# Patient Record
Sex: Male | Born: 1950
Health system: Southern US, Community
[De-identification: ages and names within clinical notes are randomized; demographics above are authoritative.]

## PROBLEM LIST (undated history)

## (undated) DIAGNOSIS — M549 Dorsalgia, unspecified: Secondary | ICD-10-CM

## (undated) DIAGNOSIS — G709 Myoneural disorder, unspecified: Secondary | ICD-10-CM

## (undated) DIAGNOSIS — K219 Gastro-esophageal reflux disease without esophagitis: Secondary | ICD-10-CM

## (undated) DIAGNOSIS — D369 Benign neoplasm, unspecified site: Secondary | ICD-10-CM

## (undated) DIAGNOSIS — F419 Anxiety disorder, unspecified: Secondary | ICD-10-CM

## (undated) DIAGNOSIS — K2270 Barrett's esophagus without dysplasia: Secondary | ICD-10-CM

## (undated) DIAGNOSIS — I739 Peripheral vascular disease, unspecified: Secondary | ICD-10-CM

## (undated) DIAGNOSIS — E119 Type 2 diabetes mellitus without complications: Secondary | ICD-10-CM

## (undated) DIAGNOSIS — N189 Chronic kidney disease, unspecified: Secondary | ICD-10-CM

## (undated) DIAGNOSIS — I10 Essential (primary) hypertension: Secondary | ICD-10-CM

## (undated) DIAGNOSIS — H269 Unspecified cataract: Secondary | ICD-10-CM

## (undated) DIAGNOSIS — I219 Acute myocardial infarction, unspecified: Secondary | ICD-10-CM

## (undated) DIAGNOSIS — M1990 Unspecified osteoarthritis, unspecified site: Secondary | ICD-10-CM

## (undated) DIAGNOSIS — M48061 Spinal stenosis, lumbar region without neurogenic claudication: Secondary | ICD-10-CM

## (undated) DIAGNOSIS — M519 Unspecified thoracic, thoracolumbar and lumbosacral intervertebral disc disorder: Secondary | ICD-10-CM

## (undated) DIAGNOSIS — F32A Depression, unspecified: Secondary | ICD-10-CM

## (undated) DIAGNOSIS — K3184 Gastroparesis: Secondary | ICD-10-CM

## (undated) DIAGNOSIS — I2510 Atherosclerotic heart disease of native coronary artery without angina pectoris: Secondary | ICD-10-CM

## (undated) DIAGNOSIS — E785 Hyperlipidemia, unspecified: Secondary | ICD-10-CM

## (undated) DIAGNOSIS — R627 Adult failure to thrive: Secondary | ICD-10-CM

## (undated) HISTORY — PX: CATARACT EXTRACTION W/ INTRAOCULAR LENS IMPLANT: SHX1309

## (undated) HISTORY — DX: Spinal stenosis, lumbar region without neurogenic claudication: M48.061

## (undated) HISTORY — DX: Hyperlipidemia, unspecified: E78.5

## (undated) HISTORY — DX: Essential (primary) hypertension: I10

## (undated) HISTORY — DX: Unspecified cataract: H26.9

## (undated) HISTORY — PX: BACK SURGERY: SHX140

## (undated) HISTORY — DX: Peripheral vascular disease, unspecified: I73.9

## (undated) HISTORY — DX: Barrett's esophagus without dysplasia: K22.70

## (undated) HISTORY — PX: SPINAL FUSION: SHX223

## (undated) HISTORY — DX: Gastroparesis: K31.84

## (undated) HISTORY — DX: Benign neoplasm, unspecified site: D36.9

## (undated) HISTORY — PX: SPINAL CORD STIMULATOR IMPLANT: SHX2422

## (undated) HISTORY — DX: Unspecified thoracic, thoracolumbar and lumbosacral intervertebral disc disorder: M51.9

## (undated) HISTORY — DX: Myoneural disorder, unspecified: G70.9

## (undated) HISTORY — PX: SPINAL CORD STIMULATOR REMOVAL: SHX2423

## (undated) HISTORY — DX: Chronic kidney disease, unspecified: N18.9

---

## 1986-09-21 HISTORY — PX: KNEE ARTHROSCOPY: SHX127

## 1995-09-22 DIAGNOSIS — I219 Acute myocardial infarction, unspecified: Secondary | ICD-10-CM

## 1995-09-22 HISTORY — DX: Acute myocardial infarction, unspecified: I21.9

## 1999-02-21 ENCOUNTER — Inpatient Hospital Stay (HOSPITAL_COMMUNITY): Admission: EM | Admit: 1999-02-21 | Discharge: 1999-02-21 | Payer: Self-pay | Admitting: Internal Medicine

## 2004-02-25 ENCOUNTER — Ambulatory Visit (HOSPITAL_COMMUNITY): Admission: RE | Admit: 2004-02-25 | Discharge: 2004-02-25 | Payer: Self-pay | Admitting: *Deleted

## 2004-02-25 ENCOUNTER — Encounter (INDEPENDENT_AMBULATORY_CARE_PROVIDER_SITE_OTHER): Payer: Self-pay | Admitting: Specialist

## 2005-01-26 ENCOUNTER — Emergency Department (HOSPITAL_COMMUNITY): Admission: EM | Admit: 2005-01-26 | Discharge: 2005-01-26 | Payer: Self-pay | Admitting: Emergency Medicine

## 2005-06-08 ENCOUNTER — Encounter (INDEPENDENT_AMBULATORY_CARE_PROVIDER_SITE_OTHER): Payer: Self-pay | Admitting: Specialist

## 2005-06-08 ENCOUNTER — Ambulatory Visit (HOSPITAL_COMMUNITY): Admission: RE | Admit: 2005-06-08 | Discharge: 2005-06-08 | Payer: Self-pay | Admitting: *Deleted

## 2005-10-27 ENCOUNTER — Emergency Department (HOSPITAL_COMMUNITY): Admission: EM | Admit: 2005-10-27 | Discharge: 2005-10-27 | Payer: Self-pay | Admitting: Family Medicine

## 2006-04-28 ENCOUNTER — Ambulatory Visit (HOSPITAL_COMMUNITY): Admission: RE | Admit: 2006-04-28 | Discharge: 2006-04-28 | Payer: Self-pay | Admitting: Neurosurgery

## 2006-10-23 ENCOUNTER — Ambulatory Visit: Payer: Self-pay | Admitting: Neurosurgery

## 2007-01-06 ENCOUNTER — Ambulatory Visit (HOSPITAL_COMMUNITY): Admission: RE | Admit: 2007-01-06 | Discharge: 2007-01-06 | Payer: Self-pay | Admitting: Neurosurgery

## 2007-03-18 ENCOUNTER — Ambulatory Visit: Payer: Self-pay | Admitting: Neurosurgery

## 2007-04-12 ENCOUNTER — Ambulatory Visit (HOSPITAL_COMMUNITY): Admission: RE | Admit: 2007-04-12 | Discharge: 2007-04-12 | Payer: Self-pay | Admitting: Surgery

## 2007-04-25 ENCOUNTER — Ambulatory Visit (HOSPITAL_COMMUNITY): Admission: RE | Admit: 2007-04-25 | Discharge: 2007-04-25 | Payer: Self-pay | Admitting: Family Medicine

## 2007-04-28 ENCOUNTER — Ambulatory Visit (HOSPITAL_COMMUNITY): Admission: RE | Admit: 2007-04-28 | Discharge: 2007-04-28 | Payer: Self-pay | Admitting: Family Medicine

## 2007-05-11 ENCOUNTER — Ambulatory Visit (HOSPITAL_COMMUNITY): Admission: RE | Admit: 2007-05-11 | Discharge: 2007-05-11 | Payer: Self-pay | Admitting: *Deleted

## 2007-05-11 ENCOUNTER — Encounter (INDEPENDENT_AMBULATORY_CARE_PROVIDER_SITE_OTHER): Payer: Self-pay | Admitting: *Deleted

## 2007-05-13 ENCOUNTER — Encounter: Admission: RE | Admit: 2007-05-13 | Discharge: 2007-05-13 | Payer: Self-pay | Admitting: *Deleted

## 2007-06-03 ENCOUNTER — Inpatient Hospital Stay (HOSPITAL_COMMUNITY): Admission: RE | Admit: 2007-06-03 | Discharge: 2007-06-06 | Payer: Self-pay | Admitting: Neurosurgery

## 2007-07-07 ENCOUNTER — Encounter: Admission: RE | Admit: 2007-07-07 | Discharge: 2007-07-07 | Payer: Self-pay | Admitting: Neurosurgery

## 2007-10-13 ENCOUNTER — Ambulatory Visit: Payer: Self-pay | Admitting: Neurosurgery

## 2007-12-13 ENCOUNTER — Encounter: Admission: RE | Admit: 2007-12-13 | Discharge: 2007-12-13 | Payer: Self-pay | Admitting: Neurosurgery

## 2007-12-17 ENCOUNTER — Ambulatory Visit: Payer: Self-pay | Admitting: Neurosurgery

## 2008-03-12 ENCOUNTER — Ambulatory Visit: Payer: Self-pay | Admitting: Neurosurgery

## 2008-08-14 ENCOUNTER — Encounter: Admission: RE | Admit: 2008-08-14 | Discharge: 2008-08-14 | Payer: Self-pay | Admitting: Neurosurgery

## 2008-10-01 ENCOUNTER — Encounter: Admission: RE | Admit: 2008-10-01 | Discharge: 2008-10-01 | Payer: Self-pay | Admitting: Neurosurgery

## 2008-10-31 ENCOUNTER — Inpatient Hospital Stay (HOSPITAL_COMMUNITY): Admission: RE | Admit: 2008-10-31 | Discharge: 2008-11-02 | Payer: Self-pay | Admitting: Neurosurgery

## 2008-12-18 ENCOUNTER — Inpatient Hospital Stay (HOSPITAL_COMMUNITY): Admission: EM | Admit: 2008-12-18 | Discharge: 2008-12-19 | Payer: Self-pay | Admitting: Emergency Medicine

## 2008-12-21 ENCOUNTER — Inpatient Hospital Stay (HOSPITAL_COMMUNITY): Admission: EM | Admit: 2008-12-21 | Discharge: 2008-12-23 | Payer: Self-pay | Admitting: Emergency Medicine

## 2009-06-28 ENCOUNTER — Encounter: Admission: RE | Admit: 2009-06-28 | Discharge: 2009-06-28 | Payer: Self-pay | Admitting: Neurosurgery

## 2009-08-13 ENCOUNTER — Encounter: Admission: RE | Admit: 2009-08-13 | Discharge: 2009-08-13 | Payer: Self-pay | Admitting: Neurosurgery

## 2009-08-16 ENCOUNTER — Encounter: Admission: RE | Admit: 2009-08-16 | Discharge: 2009-08-16 | Payer: Self-pay | Admitting: Neurosurgery

## 2009-12-04 ENCOUNTER — Inpatient Hospital Stay (HOSPITAL_COMMUNITY): Admission: RE | Admit: 2009-12-04 | Discharge: 2009-12-07 | Payer: Self-pay | Admitting: Neurosurgery

## 2010-01-02 ENCOUNTER — Encounter: Admission: RE | Admit: 2010-01-02 | Discharge: 2010-01-02 | Payer: Self-pay | Admitting: Neurosurgery

## 2010-04-01 IMAGING — CR DG LUMBAR SPINE 1V
1 series · 1 of 1 positions shown · non-contrast
Comparison: Lumbar spine films of 12/13/2007

CLINICAL DATA: Low back pain, prior lumbar spine fusion

LUMBAR SPINE - 1 VIEW

[w l-spine lat *]
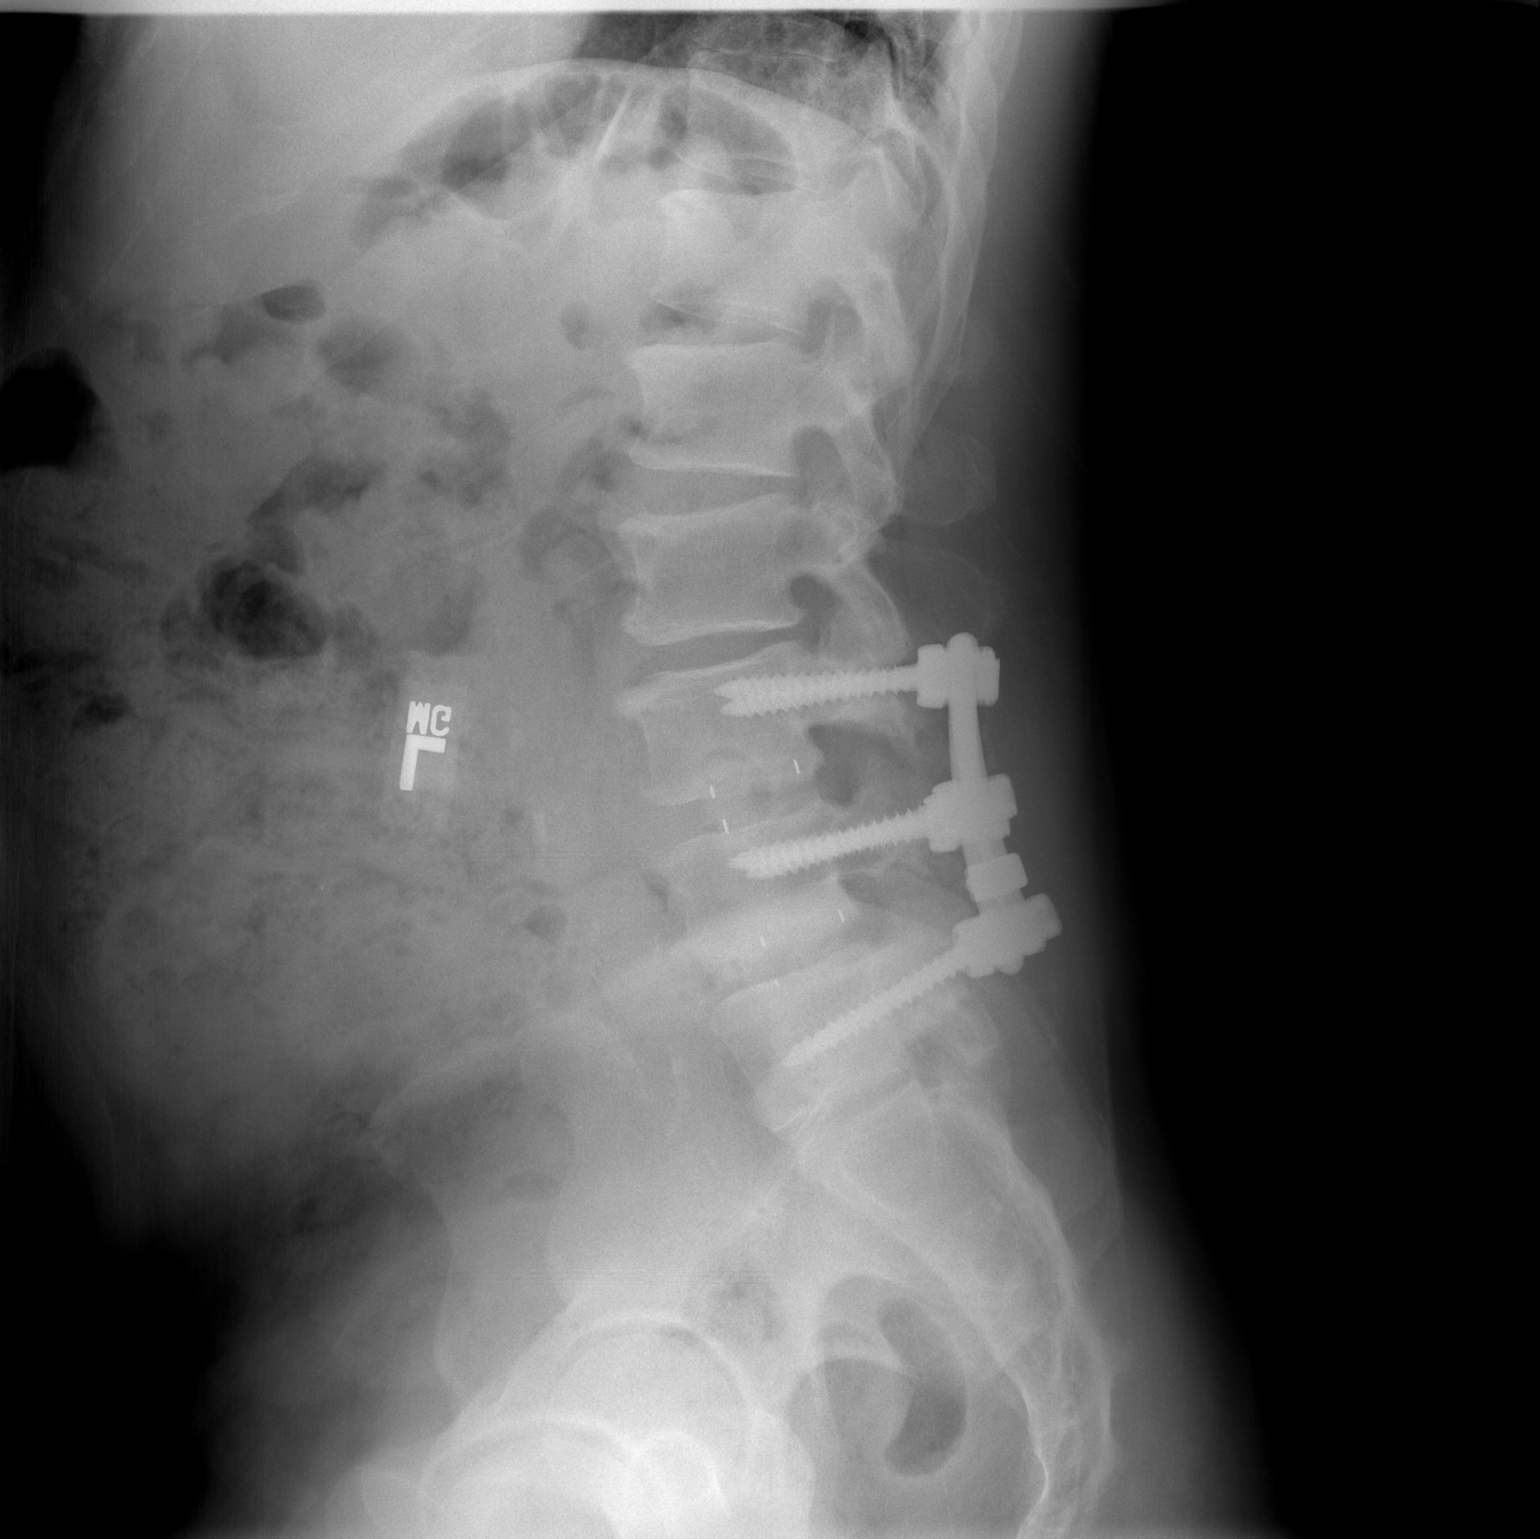

[1 of 1 positions shown; findings below may reference images not displayed]

FINDINGS: Posterior fusion at L3-4 and L4-5 appears stable.  Normal
alignment is maintained.  The hardware is intact.  Interbody fusion
plugs at L3-4 and L4-5 are unchanged in position.
IMPRESSION: Stable appearance of posterior fusion at L3-4 and L4-5 with normal
alignment.

## 2010-06-17 ENCOUNTER — Encounter: Admission: RE | Admit: 2010-06-17 | Discharge: 2010-06-17 | Payer: Self-pay | Admitting: Neurosurgery

## 2010-10-12 ENCOUNTER — Encounter: Payer: Self-pay | Admitting: Interventional Cardiology

## 2010-10-12 ENCOUNTER — Encounter: Payer: Self-pay | Admitting: Surgery

## 2010-12-14 LAB — DIFFERENTIAL
Basophils Absolute: 0 10*3/uL (ref 0.0–0.1)
Eosinophils Relative: 4 % (ref 0–5)
Lymphocytes Relative: 32 % (ref 12–46)
Lymphs Abs: 2.6 10*3/uL (ref 0.7–4.0)
Monocytes Absolute: 0.6 10*3/uL (ref 0.1–1.0)
Monocytes Relative: 7 % (ref 3–12)
Neutro Abs: 4.5 10*3/uL (ref 1.7–7.7)

## 2010-12-14 LAB — CBC
HCT: 39.6 % (ref 39.0–52.0)
Hemoglobin: 13.4 g/dL (ref 13.0–17.0)
RBC: 4.48 MIL/uL (ref 4.22–5.81)
RDW: 13.9 % (ref 11.5–15.5)
WBC: 8.1 10*3/uL (ref 4.0–10.5)

## 2010-12-14 LAB — TYPE AND SCREEN
ABO/RH(D): O POS
Antibody Screen: NEGATIVE

## 2010-12-14 LAB — GLUCOSE, CAPILLARY
Glucose-Capillary: 142 mg/dL — ABNORMAL HIGH (ref 70–99)
Glucose-Capillary: 156 mg/dL — ABNORMAL HIGH (ref 70–99)
Glucose-Capillary: 163 mg/dL — ABNORMAL HIGH (ref 70–99)
Glucose-Capillary: 196 mg/dL — ABNORMAL HIGH (ref 70–99)
Glucose-Capillary: 203 mg/dL — ABNORMAL HIGH (ref 70–99)
Glucose-Capillary: 204 mg/dL — ABNORMAL HIGH (ref 70–99)
Glucose-Capillary: 205 mg/dL — ABNORMAL HIGH (ref 70–99)

## 2010-12-14 LAB — BASIC METABOLIC PANEL
Calcium: 9.4 mg/dL (ref 8.4–10.5)
GFR calc Af Amer: >60 mL/min (ref >60)
GFR calc non Af Amer: >60 mL/min (ref >60)
Potassium: 4.5 mEq/L (ref 3.5–5.1)
Sodium: 137 mEq/L (ref 135–145)

## 2010-12-14 LAB — APTT: aPTT: 30 seconds (ref 24–37)

## 2010-12-14 LAB — MRSA PCR SCREENING: MRSA by PCR: NEGATIVE

## 2010-12-14 LAB — SURGICAL PCR SCREEN
MRSA, PCR: NEGATIVE
Staphylococcus aureus: NEGATIVE

## 2010-12-31 LAB — GLUCOSE, CAPILLARY
Glucose-Capillary: 116 mg/dL — ABNORMAL HIGH (ref 70–99)
Glucose-Capillary: 117 mg/dL — ABNORMAL HIGH (ref 70–99)
Glucose-Capillary: 172 mg/dL — ABNORMAL HIGH (ref 70–99)
Glucose-Capillary: 178 mg/dL — ABNORMAL HIGH (ref 70–99)
Glucose-Capillary: 218 mg/dL — ABNORMAL HIGH (ref 70–99)

## 2010-12-31 LAB — CBC
HCT: 34.1 % — ABNORMAL LOW (ref 39.0–52.0)
Hemoglobin: 11.8 g/dL — ABNORMAL LOW (ref 13.0–17.0)
Hemoglobin: 11.9 g/dL — ABNORMAL LOW (ref 13.0–17.0)
MCV: 86.6 fL (ref 78.0–100.0)
RBC: 3.96 MIL/uL — ABNORMAL LOW (ref 4.22–5.81)
RBC: 4.06 MIL/uL — ABNORMAL LOW (ref 4.22–5.81)
WBC: 8.7 10*3/uL (ref 4.0–10.5)
WBC: 8.8 10*3/uL (ref 4.0–10.5)

## 2010-12-31 LAB — DIFFERENTIAL
Basophils Absolute: 0 10*3/uL (ref 0.0–0.1)
Eosinophils Absolute: 0 10*3/uL (ref 0.0–0.7)
Eosinophils Relative: 0 % (ref 0–5)
Neutrophils Relative %: 80 % — ABNORMAL HIGH (ref 43–77)

## 2010-12-31 LAB — HEMOCCULT GUIAC POC 1CARD (OFFICE)
Fecal Occult Bld: NEGATIVE
Fecal Occult Bld: NEGATIVE
Fecal Occult Bld: NEGATIVE

## 2010-12-31 LAB — LIPASE, BLOOD: Lipase: 15 U/L (ref 11–59)

## 2010-12-31 LAB — COMPREHENSIVE METABOLIC PANEL
ALT: 19 U/L (ref 0–53)
AST: 22 U/L (ref 0–37)
CO2: 28 mEq/L (ref 19–32)
Chloride: 105 mEq/L (ref 96–112)
Creatinine, Ser: 0.82 mg/dL (ref 0.4–1.5)
GFR calc Af Amer: >60 mL/min (ref >60)
GFR calc non Af Amer: >60 mL/min (ref >60)
Glucose, Bld: 180 mg/dL — ABNORMAL HIGH (ref 70–99)
Total Bilirubin: 0.5 mg/dL (ref 0.3–1.2)

## 2010-12-31 LAB — T3: T3, Total: 87.5 ng/dl (ref 80.0–204.0)

## 2010-12-31 LAB — T4, FREE: Free T4: 1.11 ng/dL (ref 0.89–1.80)

## 2010-12-31 LAB — APTT: aPTT: 28 seconds (ref 24–37)

## 2010-12-31 LAB — POCT CARDIAC MARKERS

## 2011-01-01 LAB — COMPREHENSIVE METABOLIC PANEL
ALT: 11 U/L (ref 0–53)
ALT: 11 U/L (ref 0–53)
AST: 21 U/L (ref 0–37)
Albumin: 3.6 g/dL (ref 3.5–5.2)
Alkaline Phosphatase: 70 U/L (ref 39–117)
Alkaline Phosphatase: 84 U/L (ref 39–117)
BUN: 9 mg/dL (ref 6–23)
CO2: 24 mEq/L (ref 19–32)
Calcium: 9.4 mg/dL (ref 8.4–10.5)
GFR calc Af Amer: >60 mL/min (ref >60)
GFR calc non Af Amer: >60 mL/min (ref >60)
Glucose, Bld: 161 mg/dL — ABNORMAL HIGH (ref 70–99)
Potassium: 3.5 mEq/L (ref 3.5–5.1)
Potassium: 3.6 mEq/L (ref 3.5–5.1)
Sodium: 140 mEq/L (ref 135–145)
Sodium: 140 mEq/L (ref 135–145)
Total Protein: 7.2 g/dL (ref 6.0–8.3)

## 2011-01-01 LAB — HEMOGLOBIN A1C
Hgb A1c MFr Bld: 7 % — ABNORMAL HIGH (ref 4.6–6.1)
Mean Plasma Glucose: 154 mg/dL

## 2011-01-01 LAB — CARDIAC PANEL(CRET KIN+CKTOT+MB+TROPI)
CK, MB: 1.5 ng/mL (ref 0.3–4.0)
Relative Index: 1.4 (ref 0.0–2.5)
Troponin I: <0.01 ng/mL (ref 0.00–0.06)
Troponin I: <0.01 ng/mL (ref 0.00–0.06)

## 2011-01-01 LAB — POCT CARDIAC MARKERS
CKMB, poc: 1.9 ng/mL (ref 1.0–8.0)
Troponin i, poc: <0.05 ng/mL (ref 0.00–0.09)

## 2011-01-01 LAB — LIPID PANEL
HDL: 42 mg/dL (ref >39)
LDL Cholesterol: 124 mg/dL — ABNORMAL HIGH (ref 0–99)
Total CHOL/HDL Ratio: 4.3 RATIO
Triglycerides: 64 mg/dL (ref <150)
VLDL: 13 mg/dL (ref 0–40)

## 2011-01-01 LAB — LACTIC ACID, PLASMA: Lactic Acid, Venous: 2.7 mmol/L — ABNORMAL HIGH (ref 0.5–2.2)

## 2011-01-01 LAB — GLUCOSE, CAPILLARY
Glucose-Capillary: 157 mg/dL — ABNORMAL HIGH (ref 70–99)
Glucose-Capillary: 167 mg/dL — ABNORMAL HIGH (ref 70–99)

## 2011-01-01 LAB — CK TOTAL AND CKMB (NOT AT ARMC)
Relative Index: 1.8 (ref 0.0–2.5)
Total CK: 137 U/L (ref 7–232)

## 2011-01-01 LAB — DIFFERENTIAL
Basophils Relative: 1 % (ref 0–1)
Eosinophils Absolute: 0 10*3/uL (ref 0.0–0.7)
Eosinophils Relative: 0 % (ref 0–5)
Lymphs Abs: 1 10*3/uL (ref 0.7–4.0)
Monocytes Absolute: 0.2 10*3/uL (ref 0.1–1.0)
Monocytes Relative: 3 % (ref 3–12)
Neutrophils Relative %: 83 % — ABNORMAL HIGH (ref 43–77)

## 2011-01-01 LAB — CBC
HCT: 32.9 % — ABNORMAL LOW (ref 39.0–52.0)
Hemoglobin: 11.3 g/dL — ABNORMAL LOW (ref 13.0–17.0)
Hemoglobin: 12.4 g/dL — ABNORMAL LOW (ref 13.0–17.0)
MCHC: 33.6 g/dL (ref 30.0–36.0)
MCHC: 34.2 g/dL (ref 30.0–36.0)
Platelets: 303 10*3/uL (ref 150–400)
RBC: 3.8 MIL/uL — ABNORMAL LOW (ref 4.22–5.81)
RDW: 14.1 % (ref 11.5–15.5)

## 2011-01-01 LAB — BRAIN NATRIURETIC PEPTIDE: Pro B Natriuretic peptide (BNP): 171 pg/mL — ABNORMAL HIGH (ref 0.0–100.0)

## 2011-01-06 LAB — TYPE AND SCREEN
Antibody Screen: NEGATIVE
Antibody Screen: POSITIVE
PT AG Type: NEGATIVE

## 2011-01-06 LAB — POCT I-STAT 7, (LYTES, BLD GAS, ICA,H+H)
Bicarbonate: 28.5 mEq/L — ABNORMAL HIGH (ref 20.0–24.0)
Calcium, Ion: 1.16 mmol/L (ref 1.12–1.32)
HCT: 28 % — ABNORMAL LOW (ref 39.0–52.0)
Hemoglobin: 9.5 g/dL — ABNORMAL LOW (ref 13.0–17.0)
O2 Saturation: 100 %
O2 Saturation: 100 %
Patient temperature: 35.6
Potassium: 4.1 mEq/L (ref 3.5–5.1)
Potassium: 4.2 mEq/L (ref 3.5–5.1)
TCO2: 30 mmol/L (ref 0–100)
pCO2 arterial: 43.8 mmHg (ref 35.0–45.0)
pCO2 arterial: 46.7 mmHg — ABNORMAL HIGH (ref 35.0–45.0)
pH, Arterial: 7.406 (ref 7.350–7.450)
pO2, Arterial: 437 mmHg — ABNORMAL HIGH (ref 80.0–100.0)

## 2011-01-06 LAB — GLUCOSE, CAPILLARY
Glucose-Capillary: 133 mg/dL — ABNORMAL HIGH (ref 70–99)
Glucose-Capillary: 247 mg/dL — ABNORMAL HIGH (ref 70–99)

## 2011-01-06 LAB — DIFFERENTIAL
Eosinophils Absolute: 0.3 10*3/uL (ref 0.0–0.7)
Eosinophils Relative: 4 % (ref 0–5)
Lymphs Abs: 2.3 10*3/uL (ref 0.7–4.0)
Monocytes Relative: 8 % (ref 3–12)

## 2011-01-06 LAB — CBC
HCT: 38.6 % — ABNORMAL LOW (ref 39.0–52.0)
Hemoglobin: 13.1 g/dL (ref 13.0–17.0)
MCV: 89.4 fL (ref 78.0–100.0)
RBC: 4.32 MIL/uL (ref 4.22–5.81)
WBC: 8.1 10*3/uL (ref 4.0–10.5)

## 2011-02-03 NOTE — Op Note (Signed)
NAME:  Justin Key, Justin Key NO.:  [id number]   MEDICAL RECORD NO.:  [id number]          PATIENT TYPE:  INP   LOCATION:  2899                         FACILITY:  [facility]   PHYSICIAN:  Donalee Citrin, M.D.        DATE OF BIRTH:  26-May-1951   DATE OF PROCEDURE:  10/31/2008  DATE OF DISCHARGE:                               OPERATIVE REPORT   PREOPERATIVE DIAGNOSIS:  1. Lumbar spinal stenosis.  2. 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  3. Possible pseudoarthrosis at L4 through S1 with transitional segment      being labeled as 1.   PROCEDURE:  Re-exploration of  lumbar fusion at L4 to S1, decompression  of lumbar laminectomy at L2-3 and L3-4, and __________ interbody fusion,  posterior lumbar interbody fusion at L2-3 and L3-4 using a hybrid  Telamon PEEK cage and tangent allograft wedge at this level.  Pedicle  screw fixation at L2 through S1 using 6.35 pedicle screw system,  posterolateral arthrodesis at L2-S1 using local autograft mixed with  Actifuse as well as BMP and Mastergraft.   ASSISTANT:  Kathaleen Maser. Pool, MD.   ANESTHESIA:  General endotracheal.   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  He subsequently underwent laminectomy and diskectomy, subsequently  required an L4-S1 fusion.  Since then initially did well, however, had  progressive worsening back and leg pain over the last several months.  Repeat imaging showed progressive breakdown at L3-4 and L2-3 above the  level of this fusion.  This showed some evidence of instability on his  dynamic x-rays as well as his CT scan.  There was a hint that he had  only partially fused at L4-L5 and L5 to S1, possible pseudoarthrosis  with severe foraminal stenosis and central stenosis at L3-4 and  subsequently a facet arthropathy and foraminal stenosis at L2-3.  Due to  the patient's failure to conservative treatment and progressing  disease  and possible pseudarthrosis, the patient was recommended reexploration  of lumbar fusion at L4 to S1 as well as decompression and stabilization  procedures at L2-3 and L3-4.  Risks and benefits of the operation were  explained to the patient.  He understood and agreed to proceed forward.   The patient was brought into the OR, was induced general anesthesia,  positioned prone on the Wilson frame.  Back was prepped and draped in  the usual sterile fashion.  His old incision was opened up and scar  tissue was dissected off the hardware, exposing the hardware at L4-S1 as  well as subperiosteal dissection, exposing the T-piece at L2-3 and L3-4.  Intraoperative x-ray confirmed localization at the appropriate level.  The old nuts and rods were then removed.  Dissection was carried out  posterolaterally from L4 to S1.  There was a fair amount of  posterolateral bend and all the screws appeared to be solid as well as  there was immediate hint of motion at L5 to S1 between the L5 and S1  screws.  However, this  was not grossly unstable by any means and there  was a fair amount of solid posterolateral bone, especially on the left  side extending from L4 to S1.  Then, attention was taken to the  decompression and spinous processes were removed at L2-3.  Central  decompression was begun.  There was marked facet arthropathy at L3-4  with severe allograft compression of thecal sac and an extensive scar  tissue.  This was all dissected free with a 4 Penfield and removed in  piecemeal fashion.  Decompression of the central canal and complete  medial facetectomies were performed at L2-3 and L3-4.  This helped  decompress both at L2, 3, 4 roots and access was gained at the lateral  margin of the disk space after __________ coagulated and after  decompression was completed, __________ L2, 3, 4 roots.  Attention was  taken to doing a pedicle screw placement.  Using a high-speed drill,  pilot holes  were drilled at L2 of the right, cannulated with the awl  probed with a pedicle probed, and tapped with a 5 x 5 tap, probed again  and 6.5 x 45 screws inserted on the right at L2 using both external and  internal bony landmarks as well as fluoroscopy confirmed trajectory.  This screw was placed in an excellent purchase.  An L3 screw was placed  in a similar fashion as well as the L2-3 screw on the left side.  After  all screws were placed,  attention was taken to interbody work fusion at  L3-4 reflecting the right L4 nerve root medially and  __________11  scalpel and a size 10 distractor was inserted after disk space was  cleaned out.  A good apposition of the endplates felt to be appropriate  size and graft material.  So working on the left side, there was noted  to be anular tear and frank herniation displaced in both L3 and L4 roots  on the left.  This was all cleaned out radically and then using a size  10 cutter and chisel, endplates were scraped.  Disk space was cleaned  out.  A tangent allograft wedge was inserted after bone scrapping the  endplates with chisel were applied.  Again, fluoroscopy used at each  step along the way to confirm depth and trajectory.  A Telamon 10 x 20  mm PEEK cage packed with local autograft and Actifuse was inserted on  the patient's left side.  Then, working on the right side, the  distractor was removed.  Disk space was cleaned out in a similar fashion  with size 10 cutter and chisel.  A local autograft was packed centrally  after central endplates were scraped and central disk was removed and  then on the right side, Tangent was inserted.  After this hemostasis was  done.  Attention was taken to L2-3.  This disk __________ markedly  degenerated.  This was cleaned out as well in a similar fashion with  size 10 cutter and chisel.  Tangent allograft was inserted on the left  side, Telamon packed with autograft and Actifuse was inserted on the  patient's  right side with autograft centrally.  After all the interbody  work done, attention was taken to copious irrigation, and aggressive  decortication was carried out at the T-piece lateral gutters and the  posterolateral bone from L4 down to S1, down to the T-piece.  The  remainder of the autograft mixed with Actifuse was packed  posterolaterally.  Augmented with  BMP and Mastergraft, all along at L2  down the S1.  This has been done, a rod was cut, fashioned, and lordosed  and nuts were tightened down in situ from L4-L5 and S1, then the L3  screws compressed against S1 and the L2 compressed against L3.  Then a  420 cross-link was inserted.  The neuroforamina were all reinspected and  they were noted to be widely patent.  Gelfoam was overlaid on top of the  dura.  Large Hemovac drain was placed and then the wound was closed in  layers with interrupted Vicryl and the skin was closed with a running 4-  0 subcuticular.  Benzoin and Steri-Strips were applied.  The patient  went to recovery room in stable condition.  At the end of the case,  sponge and instrument counts were correct.           ______________________________  Donalee Citrin, M.D.     GC/MEDQ  D:  10/31/2008  T:  11/01/2008  Job:  [id number]

## 2011-02-03 NOTE — Discharge Summary (Signed)
NAME:  Justin Key, Justin Key NO.:  [id number]   MEDICAL RECORD NO.:  [id number]          PATIENT TYPE:  AMB   LOCATION:  ENDO                         FACILITY:  [facility]   PHYSICIAN:  Donalee Citrin, M.D.        DATE OF BIRTH:  Jan 02, 1951   DATE OF ADMISSION:  05/11/2007  DATE OF DISCHARGE:  05/11/2007                               DISCHARGE SUMMARY   ADMITTING DIAGNOSES:  1. Recurrent disk herniation.  2. Lumbar spinal stenosis L4-5, L5-S1.   PROCEDURE:  Redo decompressive laminectomy L5-S1, decompressive  laminectomy L3-4-5, posterior lumbar fusion L4-5 and L5-S1.   HOSPITAL COURSE:  The patient is a very pleasant 60 year old gentleman  who several months ago had undergone redo laminectomy for recurring  disk.  The patient did very well for about 6 weeks and then recurred a  disk again.  Due to the patient's multiple recurrences, his progressive  worsening back and leg pain, his MRI findings showing a large recurrent  disk herniation with progressive spinal stenosis, the patient was  recommended redo decompressive laminectomy and fusion.  The risks and  benefits of the operation were explained to the patient and so he was  brought into the OR.  He was admitted to the hospital as an EMA  underwent the aforementioned procedure.  Postoperatively, the patient  did very well on the recovery room and the floor.  On the floor, the  patient convalesced well with significant improvement of his leg pain.  He was progressively mobilized over the first 24 to 48 hours.  His wound  remained dry.  His Foley was even taken out on day one.  He was  ambulating with physical therapy.  His pain became under good control  with the oral medications and by hospital day 3, the patient was stable  and was able to be discharged home.  He was discharged on Oxycodone and  Valium and ambulating and voiding spontaneously.           ______________________________  Donalee Citrin, M.D.     GC/MEDQ  D:   07/01/2007  T:  07/01/2007  Job:  [id number]

## 2011-02-03 NOTE — Op Note (Signed)
NAME:  Justin Key, Justin Key NO.:  [id number]   MEDICAL RECORD NO.:  [id number]          PATIENT TYPE:  AMB   LOCATION:  ENDO                         FACILITY:  [facility]   PHYSICIAN:  Georgiana Spinner, M.D.    DATE OF BIRTH:  1951/08/23   DATE OF PROCEDURE:  05/11/2007  DATE OF DISCHARGE:                               OPERATIVE REPORT   PROCEDURE:  Upper endoscopy with biopsy.   INDICATIONS:  GERD with question of Barrett's esophagus.   ANESTHESIA:  Demerol 100 mg, Versed 10 mg, Phenergan 12 mg.   DESCRIPTION OF PROCEDURE:  With the patient mildly sedated in the left  lateral decubitus position, the Pentax videoscopic endoscope was  inserted in the mouth and passed under direct vision through the  esophagus which appeared normal.  The distal esophagus was approached  and I could not see clear cut evidence of Barrett's esophagus.  The  patient became somewhat combative at this point and we photographed the  area and took biopsies of the squamocolumnar junction.  I advanced into  the stomach, fundus, body, antrum, duodenal bulb, and second portion of  the duodenum appeared normal.  From this point, the endoscope was slowly  withdrawn taking circumferential views of the duodenal mucosa until the  endoscope had been pulled back into the stomach and placed in  retroflexion to view the stomach from below.  No loose wrap of the GE  junction around the endoscope was noted indicating laxity of the lower  esophageal sphincter. The endoscope was then straightened and withdrawn.  The patient's vital signs and pulse oximetry remained stable.  The  patient tolerated the procedure well without apparent complications.   FINDINGS:  Fairly unremarkable examination, clear cut evidence of  Barrett's could not be seen at this time. The patient was somewhat  combative during this exam which limited our view, somewhat.   PLAN:  Await biopsy report.  The patient will call me for results and  follow-up with me as an outpatient.           ______________________________  Georgiana Spinner, M.D.     GMO/MEDQ  D:  05/11/2007  T:  05/11/2007  Job:  [id number]   cc:   [name], M.D.  Fax: [phone]   Donalee Citrin, M.D.  Fax: [phone]   Ardeth Sportsman, MD  [address] [place] [address]

## 2011-02-03 NOTE — H&P (Signed)
NAME:  Justin Key, Justin Key NO.:  [id number]   MEDICAL RECORD NO.:  [id number]          PATIENT TYPE:  INP   LOCATION:  1826                         FACILITY:  [facility]   PHYSICIAN:  Michiel Cowboy, MDDATE OF BIRTH:  Jul 17, 1951   DATE OF ADMISSION:  12/17/2008  DATE OF DISCHARGE:                              HISTORY & PHYSICAL   PRIMARY CARE PHYSICIAN:  [name], M.D.   CHIEF COMPLAINT:  Chest pain, shortness of breath and severe nausea.   The patient is a 60 year old gentleman with past history of myocardial  infarction although I do not see this documented in the echart, and also  history of diabetes.  The patient has had recent reexploration of lumbar  fusion at the level of L4-S1 done on February 10.  The patient was  discharged home on oxycodone and OxyContin for pain relief.  He has  become progressively more and more constipated.  He does report making  small bowel movements but no big bowel movements and he has been having  abdominal distention and today he started having worsening nausea.  He  had a lot of dry heaves as well as vomiting.  After repeated episodes of  vomiting he started to have a dull ache in substernal area with some  shortness of breath associated with that.  Per patient it is somewhat  similar to his myocardial infarction but not as severe.  This chest pain  continued all day long and finally subsided.  Per EMS report  nitroglycerin did help but the patient cannot quite recall.  Pain is  nonpleuritic and the patient attributed it to his vomiting.  Otherwise  no fevers, no chills.  He had been having mild left lower quadrant  abdominal pain but mainly his back has been bothering him a lot.   No diaphoresis.  No diarrhea and otherwise review of systems  unremarkable.   PAST MEDICAL HISTORY:  1. History of myocardial infarction.  2. Diabetes.  3. Chronic back pain.  4. Severe GERD and possible Barrett's esophagus.   SOCIAL  HISTORY:  The patient used to smoke but currently reports he  inhales nicotine, does not abuse drugs and does not drink.  Lives at  home and is married.   FAMILY HISTORY:  Noncontributory.   ALLERGIES:  No known drug allergies.   MEDICATIONS:  1. Metformin 1000 mg p.o. twice a day.  2. Omeprazole 40 mg twice a day.  3. Aspirin 81 mg p.o. daily.  4. Tramadol 50 mg every 6 hours as needed.  5. OxyContin 20 mg twice a day.  6. Diazepam 5 mg, the patient is not taking 3 times a day but takes as      needed.  7. Oxycodone 5 mg as needed for pain.  8. Neurontin 600 mg in the morning, 300 mg at noon and 600 mg q.h.s.  9. Stool softener.   PHYSICAL EXAMINATION:  VITALS:  Temperature 98.8, blood pressure 149/75,  pulse 65, respirations 16, saturating 100% on room air.  GENERAL:  The patient appears to be in no acute distress.  HEAD:  Nontraumatic.  Moist mucous membranes.  LUNGS:  Clear to auscultation bilaterally.  HEART:  Regular rate and rhythm.  No murmurs, rubs or gallops.  ABDOMEN:  Soft.  There is a mild left lower quadrant tenderness.  EXTREMITIES:  Lower Extremities: No clubbing, cyanosis or edema.  NEUROLOGY:  The patient is intact.  SKIN:  Clean, dry and intact.   LABS:  White blood cell count 8.0, hemoglobin 12.4, sodium 140,  potassium 3.6, creatinine 0.9.  LFTs within normal limits.  Lactic acid  slightly elevated to 2.7, lipase 13.  Cardiac enzymes negative.  Chest x-  ray unremarkable.  KUB showing inspissated stool.  EKG showing T-wave  inversion in lead III which was isolated and unchanged from prior EKG.   ASSESSMENT/PLAN:  This is a 60 year old gentleman with self-reported  history of coronary artery disease who presents with chest pain and  nausea as well as severe constipation.  1. Chest pain.  Given risk factors will cycle cardiac markers and      admit to tele floor.  Recheck hemoglobin A1c and TSH as well as      fasting lipid panel and consider cardiology  consult to see if the      patient may need a repeat stress test.  Will also obtain records of      his history of coronary artery disease.  2. Severe constipation.  Will give aggressive bowel regimen and repeat      KUB in a.m. to see if clearance has occurred.  Slightly elevated      lactate.  The patient does not have severe abdominal pain which      would make ischemic bowel worrisome.  We will cycle cardiac markers      as this could be a sign of ischemia.  If his abdominal pain worsens      will get CT scan of the abdomen, but currently very mild symptoms.  3. Diabetes.  Will hold metformin.  Continue sliding scale.  4. Gastroesophageal reflux disease.  Continue Protonix.  5. Back pain.  Continue oxycodone and OxyContin.  I wonder if the      patient should be reassessed by Dr. Donalee Citrin to see if this is      part of his normal recovery.  6. Prophylaxis.  Protonix plus Lovenox.      Michiel Cowboy, MD  Electronically Signed     AVD/MEDQ  D:  12/18/2008  T:  12/18/2008  Job:  [id number]   cc:   [name], M.D.  Lyn Records, M.D.

## 2011-02-03 NOTE — Op Note (Signed)
NAME:  Justin Key, Justin Key NO.:  [id number]   MEDICAL RECORD NO.:  [id number]          PATIENT TYPE:  INP   LOCATION:  3017                         FACILITY:  [facility]   PHYSICIAN:  Donalee Citrin, M.D.        DATE OF BIRTH:  1951/05/24   DATE OF PROCEDURE:  DATE OF DISCHARGE:                               OPERATIVE REPORT   PREOPERATIVE DIAGNOSES:  1. Degenerative disk disease, L4-5, L5-S1.  2. Recurrent disk rupture of L5-S1.  3. Transitional segment at S1 with a lumbarized sacral S1, determining      that the rudimentary disk space between S1-2 is the transitional      segment, S1 is partially lumbarized.  The L5-S1 disk space may      previously have been referred to as L4-5, the site of previous      surgery.  However, the two disk spaces were fused above the level      of the rudimentary disk transitional segment.   PROCEDURES:  1. Redo decompressive lumbar laminectomy, L5-S1.  2. Posterior lumbar fusion, L5-S1 decompressive laminectomy at L4-5      with posterior lumbar interbody fusion, using a hybrid Telemon PEEK      cage packed with locally-harvested allograft mixed with Progenix      bone substitute, also with tangent allograft wedge.  3. Pedicle screw fixation with a 6.35 Legacy pedicle screw system from      L4 to S1.  4. Posterolateral arthrodesis, L4 to S1, using locally-harvested      autograft mixed with Progenix bone substitute.  5. Placement of a medium Hemovac drain.   SURGEON:  Donalee Citrin, M.D.   ASSISTANT:  Reinaldo Meeker, M.D.   ANESTHESIA:  General endotracheal.   HISTORY OF PRESENT ILLNESS:  The patient is a very pleasant58 year old  gentleman who had undergone two previous laminectomies at L5-S1, which  again is a disk space above the level of a transitional segment  rudimentary disk at S1-2 with a lumbarized S1.  He had undergone two  previous laminectomies with recurrence each time.  The last one was  about 6 months ago.  The patient  developed progressive worsening leg  pain with back greater than right leg pain.  Repeat imaging showed  another recurrence and due to this being a second recurrence in a short  period of time, the patient was recommended redo laminectomy and fusion  as well as superiorly where there was a large central disk herniation,  severe facet arthropathy and degeneration at L4-5.  Risks and benefits  of a two-level interbody fusion were explained to the patient and he  understands and agreed proceed forward.   The patient was brought into the OR and was induced under anesthesia,  placed prone on the Wilson frame.  The back was prepped and draped in  the usual fashion.  The old incision was opened up and extended  cephalad.  Subperiosteal dissection was carried out in the laminae of  L4, L5 and S1.  The scar tissue was dissected off of the L5-S1  laminotomy defect on the  left side.  TPs were exposed at L4, L5 and S1  bilaterally.  Intraoperative x-ray confirmed localization at the  appropriate level.  Then the residual spinous process and lamina were  removed at L5 as well as L4.  Complete central decompression was  initially begun at L4-5 with complete medial facetectomies and radical  foraminotomies of the L4 and L5 nerve roots.  This was carried down,  careful sharp dissection and blunt dissection carried through the scar  tissue L5-S1 on the left.  The scar tissue was freed up.  Complete  decompressive medial facetectomies were performed on the right side at  L5-S1, decompressing the S1 nerve root and L5 nerve root out the  foramen.  Both facets at L4-5 and L5-S1 bilaterally were noted to be  markedly diastased and incompetent with hypermobility at each segment.  After the scar tissue was dissected free and the L5 and S1 nerve roots  were identified and the S1 pedicle identified on the left side, the  undersurface of the S1 nerve root was dissected off a large recurrent  disk herniation,  immediately expressed and visualized, presented itself,  removed with the pituitary rongeurs.  At the end of the diskectomy on  this side the thecal sac was decompressed.  First attention was taken to  pedicle screw placement and pilot holes were drilled at L4 on the left,  cannulated with the awl, probed again, tapped with a 5.5. tap, probed  again, and a 6.5 x 45 screw inserted on the left at S1.  Fluoroscopy  used at each step along the way.  The pedicle was probed within the  pedicle as well as within the canal to confirm no medial or lateral  breach, then this procedure repeated at L5-S1 on the left with a 6.5 x  45 screw inserted at all three levels.  On the right side this procedure  was repeated and after all six screws were placed, attention was taken  to the interbody work.  First the S1 nerve root on the right was  retracted.  Annulotomy was made with an 11 blade scalpel.  The disk  space was cleaned out.  A size 10 distractor was inserted.  This was  felt to be too loose.  A size 12 distractor was then substituted.  This  had good apposition to the endplates and felt to be appropriate sizing  for the graft material so two grafts were opened, a 12 x 22 Telemon and  a 12 x 26 Tangent.  Then on the left side, dissecting around the scar  tissue, the S1 nerve root was reflected medially.  Annulotomy was made.  Several more fragments of disk were removed from the central and lateral  compartments.  The disk space was cleaned out.  A size 12 cutter and  chisel were used to prepare the endplates and a 12 x 22-mm Telemon PEEK  cage packed with locally-harvested allograft mixed with Progenic bone  substitute was inserted on the left side.  Then the distractor was  removed on the right.  Fluoroscopy confirmed each step along the way and  confirmed good position of the interbody plug.  Then in a similar  fashion the right-sided interspace was prepared.  The autograft was  packed against  the left-sided cage and the right-sided allograft was  inserted.  At L4-5 the interspace was prepared in a A similar fashion.  Again several very large disk herniations coming from the central  compartment teased  away and removed with upbiting pituitaries from L4-5.  Then the Telemon was inserted on the right side, autograft was packed  centrally and the Tangent cage was inserted on the left side.  After all  interbody plugs and screws had been placed, the wound was copiously  irrigated.  Aggressive decortication was carried out in the TPs and  lateral gutters.  The remainder of the autograft was packed in the  lateral gutters from L4 down S1.  Seventy-millimeter rods were then  placed and top tighteners tightened down at S1, the L5 pedicle was  compressed against S1 and the L4 compressed against L5.  All neural  foramina were reinspected to confirm patency and no migration of graft  material.  Gelfoam was overlaid on top of the nerve roots.  A 422  crosslink was inserted between L5 and S1.  Then a medium Hemovac drain  was placed and the wound was closed in layers with interrupted Vicryl  and running 4-0 subcuticular on the skin.  Postop fluoroscopy confirmed  good position of the screws and bone grafts.  All needle and sponge  counts were correct postoperatively.  The wound was dressed.  The  patient went to the recovery room in stable condition.  At the end of  the case needle counts and sponge counts were correct.           ______________________________  Donalee Citrin, M.D.     GC/MEDQ  D:  06/03/2007  T:  06/04/2007  Job:  [id number]

## 2011-02-03 NOTE — H&P (Signed)
NAME:  Justin Key, Justin Key NO.:  [id number]   MEDICAL RECORD NO.:  [id number]          PATIENT TYPE:  INP   LOCATION:  1824                         FACILITY:  [facility]   PHYSICIAN:  Michiel Cowboy, MDDATE OF BIRTH:  1951/06/16   DATE OF ADMISSION:  12/20/2008  DATE OF DISCHARGE:                              HISTORY & PHYSICAL   PRIMARY CARE PHYSICIAN:  [name], M.D.   CHIEF COMPLAINT:  Intractable nausea and vomiting.   HISTORY OF PRESENT ILLNESS:  The patient has been just recently  discharged yesterday from [facility] when he was admitted with  chest pain, shortness of breath and severe nausea.  This had been  altogether going on since Monday.  Please see H and P from March 30 for  further details.  After his discharge yesterday he went home, ate dinner  and that was okay.  He had no nausea and no vomiting.  Then starting  this a.m. he woke up with severe nausea and was not able to tolerate any  p.o. throughout the day.  He was vomiting a lot and he says when he  vomits, he eventually gets short of breath has a burning sensation in  his chest.  Of note, his cardiac markers had been recently cycled and he  was ruled out for MI from just a few days ago.  Otherwise no fevers, no  chills and no headache.   REVIEW OF SYSTEMS:  Negative except for his HPI.   PAST MEDICAL HISTORY:  Significant for:  1. Reported myocardial infarction.  2. Diabetes.  3. Chronic back pain status post recent surgery.  4. Severe GERD and possible Barrett's esophagus.   SOCIAL HISTORY:  Unchanged from prior H and P.  The patient denies  current smoking of cigarettes.  He does not abuse drugs and does not  drink.  He lives at home with his wife.   FAMILY HISTORY:  Noncontributory.   ALLERGIES:  No known drug allergies.   MEDICATIONS:  1. Metformin 1000 mg p.o. twice a day.  2. Omeprazole 40 mg twice a day.  3. Aspirin 81 mg a day.  4. Tramadol 50 mg every 6 hours as  needed.  5. OxyContin 20 mg twice a day.  6. Diazepam 5 mg just before bedtime as needed.  7. Oxycodone 5 mg as needed for pain.  8. Neurontin 600 in the morning and 300 at noon and 600 nightly.  9. The patient was also discharged on Reglan.   PHYSICAL EXAMINATION:  VITAL SIGNS:  Temperature 98.1, blood pressure  155/81, pulse 59, respirations 20, saturating 100% on room air.  GENERAL:  The patient currently denies any chest pain or shortness of  breath.  The patient appears to be in no acute distress resting  comfortably in bed.  HEENT:  Head atraumatic.  Moist mucous membranes.  LUNGS:  Clear to auscultation bilaterally.  HEART:  Regular rate and rhythm.  No murmurs, rubs, or gallops.  Somewhat slow.  ABDOMEN:  Soft and nondistended.  There is mild epigastric tenderness.  EXTREMITIES:  Lower extremities without cyanosis, clubbing,  or edema.  NEUROLOGY:  Intact.  Strength 5/5 in all four extremities.  Cranial  nerves intact.  There is a disconjugate gaze which is old.  SKIN:  Clean, dry and intact.   LABORATORY DATA:  White blood cell count 8.7, hemoglobin 11.9, platelets  264.  Sodium 141, potassium 3.4, BUN 6, creatinine 0.82.  LFTs within  normal limits.  Lipase 15.  Cardiac markers negative.   KUB and chest x-ray unremarkable.  CT scan of the abdomen showing  moderate amount of stool, otherwise unremarkable.   ASSESSMENT/PLAN:  This is a 60 year old gentleman with recurrent nausea  and vomiting thought to be initially secondary to constipation.  The  patient had been recently admitted and had aggressive bowel regimen with  what we thought was good results, but now still presents with nausea and  vomiting.  1. Nausea and vomiting.  Admit for further workup as the patient      states he cannot tolerate any p.o.  The patient still has moderate      stool, although no evidence of ileus.  I wonder if it somewhat      could be still contributing to his nausea, but perhaps doubt.   I      wonder if chronic narcotics, especially escalation of recent      narcotics after surgery, has much to do with this.  We will try to      wean off which the patient states he understands.  Also possibility      of gastroparesis, again less likely since this has sort of just      started 1 week ago, but we will evaluate with gastric emptying      study.  The patient is diabetic, although his hemoglobin was 7.0.      Another possibility could be central nausea.  We will get a CT scan      of the head to rule out a mass effect.  2. Also the patient has a history of gastroesophageal reflux disease      and possibly Barrett's esophagus.  Consider GI consult if no      improvement.  I wonder he needs a scope.  If there is any evidence      of esophagitis which could contribute to his symptoms, although      does not really sound like it.  His symptoms are not really      consistent with strictures.  His nausea does not seem to be worse      with eating, but rather just occurs even on an empty stomach.  We      will give intravenous fluids and continue Protonix.  3. Diabetes.  We will hold Metformin and continue sliding scale      insulin.  4. Chronic pain.  We will hold OxyContin and increase Tramadol and      give oxycodone p.r.n.  5. Constipation.  Continue MiraLax and give magnesium citrate.  May      even need to repeat GoLYTELY as he had good results with this in      the past.  6. Prophylaxis.  Protonix and Lovenox.      Michiel Cowboy, MD  Electronically Signed     AVD/MEDQ  D:  12/21/2008  T:  12/21/2008  Job:  [id number]   cc:   [name], M.D.

## 2011-02-06 NOTE — Op Note (Signed)
NAME:  Justin Key, Justin Key                       ACCOUNT NO.:  [id number]   MEDICAL RECORD NO.:  [id number]                   PATIENT TYPE:  AMB   LOCATION:  ENDO                                 FACILITY:  [facility]   PHYSICIAN:  Georgiana Spinner, M.D.                 DATE OF BIRTH:  08-Apr-1951   DATE OF PROCEDURE:  02/25/2004  DATE OF DISCHARGE:                                 OPERATIVE REPORT   PROCEDURE:  Colonoscopy with polypectomy and biopsy.   INDICATIONS:  Colon polyp.   ANESTHESIA:  1. Demerol 10.  2. Versed 2 mg.   PROCEDURE:  With the patient mildly sedated in the left lateral decubitus  position, a rectal examination was performed which was unremarkable.  Subsequently, the Olympus videoscopic colonoscope was inserted in the rectum  and passed under direct vision to the cecum, identified by the ileocecal  valve and base of cecum, both of which were photographed.  From this point,  the colonoscope was slowly withdrawn, taking circumferential views of the  colonic mucosa, stopping 20 cm from the anal verge, at which point a large  multiloculated polyp was seen, it was on a stalk, and we photographed this  and subsequently using snare cautery technique, setting of 20/150 blended  current with the Erbe pulse generator, we were able to remove this polyp,  suction it to the end of the endoscope and withdrew it through the anal  canal.  It was retrieved for pathology.  The endoscope was then reinserted  back to this level.  There were three small areas around it that appeared to  have adenomatous tissue and this was then also removed using at this time  hot biopsy forceps technique, same setting of 20/150 blended current.  Once  accomplished, the endoscope was then withdrawn all the way to the rectum  which appeared normal on direct and retroflexed view.  The endoscope was  straightened and withdrawn.  The patient's vital signs and pulse oximeter  remained stable.  The patient  tolerated the procedure well without apparent  complications.   FINDINGS:  Polyp at 20 cm from the anal verge.   PLAN:  Await biopsy report.  Patient will call me for results and followup  with me as an outpatient.                                               Georgiana Spinner, M.D.    GMO/MEDQ  D:  02/25/2004  T:  02/25/2004  Job:  [id number]   cc:   [facility]  Attn:  Dr. Katrinka Blazing  [address]  [place], [place] [address]

## 2011-02-06 NOTE — Op Note (Signed)
NAME:  Justin Key, Justin Key                       ACCOUNT NO.:  [id number]   MEDICAL RECORD NO.:  [id number]                   PATIENT TYPE:  AMB   LOCATION:  ENDO                                 [facility]ACILITY[facility]   PHYSICIAN:  Georgiana Spinner, M.D.                 DATE OF BIRTH:  March 02, 1951   DATE OF PROCEDURE:  02/25/2004  DATE OF DISCHARGE:                                 OPERATIVE REPORT   PROCEDURE:  Upper endoscopy.   INDICATIONS:  GERD with previous history of Barrett's esophagus.   ANESTHESIA:  1. Demerol 50.  2. Versed 5 mg.   PROCEDURE:  With the patient mildly sedated in the left lateral decubitus  position, the Olympus videoscopic endoscope inserted in the mouth, passed  under direct vision through the esophagus which when viewed showed possibly  some irritation of the gastroesophageal mucosal junction but no clear cut  Barrett's was seen.  We photographed this area then took random biopsies of  the border.  Entered into the stomach.  Fundus, body appeared normal.  Antrum showed some mild erythematous changes consistent with localized  erosion, it was photographed and biopsied.  Duodenal bulb also showed some  mild erythema.  The second portion of duodenum appeared normal.  From this  point the endoscope was slowly withdrawn, taking circumferential views of  duodenal mucosa until the endoscope had been pulled back into the stomach,  placed in retroflexion to view the stomach from below.  The endoscope was  then straightened and withdrawn, taking circumferential views of the  remaining gastric and esophageal mucosa.  The patient's vital signs, pulse  oximeter remained stable.  The patient tolerated the procedure well with no  apparent complication.   FINDINGS:  1. Changes of the distal esophagus, biopsied.  2. Changes in the antrum, biopsied.  3. Mild duodenitis.   PLAN:  Await biopsy report.  Patient will call me for results and followup  with me as an outpatient.   Proceed to colonoscopy.                                               Georgiana Spinner, M.D.    GMO/MEDQ  D:  02/25/2004  T:  02/25/2004  Job:  [id number]

## 2011-02-06 NOTE — Discharge Summary (Signed)
NAME:  Justin Key, Justin Key NO.:  [id number]   MEDICAL RECORD NO.:  [id number]          PATIENT TYPE:  AMB   LOCATION:  ENDO                         [facility]ACILITY:  [facility]   PHYSICIAN:  Donalee Citrin, M.D.        DATE OF BIRTH:  Aug 31, 1951   DATE OF ADMISSION:  05/11/2007  DATE OF DISCHARGE:  05/11/2007                               DISCHARGE SUMMARY   ADMITTING DIAGNOSES:  Degenerative disc disease.  Recurrent disc herniation L4-5 and L5-S1.   PROCEDURE:  Redo laminectomies at L4-5 and L5-S1.  Posterior lumbar fusion at L4-5 and L5-S1.   HOSPITAL COURSE:  The patient was admitted and immediately went to the  operating room where the aforementioned procedure was done.  Postoperatively the patient did very well in the recovery room and then  to the floor.  On the floor the patient had significant improvement of  his leg pain.  His back pain came under progressively better control.  After a couple of days postoperatively the patient had his Foley taken  out.  His drains were taken out.  He was progressively mobilized with  physical therapy and was able to be discharged home on hospital day 3  with scheduled follow up in a week and a half. At the time of discharge  the patient was ambulating and voiding spontaneously with significant  improvement in pain control.           ______________________________  Donalee Citrin, M.D.     GC/MEDQ  D:  07/20/2007  T:  07/20/2007  Job:  [id number]

## 2011-02-06 NOTE — Op Note (Signed)
NAME:  Justin Key, Justin Key NO.:  [id number]   MEDICAL RECORD NO.:  [id number]          PATIENT TYPE:  OIB   LOCATION:  3037                         FACILITY:  [facility]   PHYSICIAN:  Donalee Citrin, M.D.        DATE OF BIRTH:  February 24, 1951   DATE OF PROCEDURE:  04/28/2006  DATE OF DISCHARGE:  04/28/2006                                 OPERATIVE REPORT   SURGEON:  Donalee Citrin, M.D.   ASSISTANT:  Tia Alert, M.D.   PREOPERATIVE DIAGNOSES:  Left-sided L4-5 radiculopathy from large ruptured  disk, L4-5, left, with free fragment migrating behind the 4 body.   POSTOPERATIVE DIAGNOSES:  Left-sided L4-5 radiculopathy from large ruptured  disk, L4-5, left, with free fragment migrating behind the 4 body.   PROCEDURE:  Decompressive lumbar laminectomy and microdiskectomy at L4-5,  left, with microscope dissection, left L4 and 5 nerve root and microscopic  diskectomy.   ANESTHESIA:  General endotracheal.   HISTORY OF PRESENT ILLNESS:  The patient is a very pleasant 60 year old  gentleman with some longstanding back and left leg pain radiating down to  the top of his foot and his big toe, as well as the anterior shin, with  numbness and tightening in the same distribution.  The patient had some  intermittent subtle weakness on dorsiflexion of the EHL.  Preoperative  imaging showed a very large disk herniation at L4-5, migrating superiorly  behind the L4 body at the level of the L4 pedicle, compressing the L4 nerve  against the L4 pedicle, as well as causing severe foraminal stenosis of the  L5 nerve root.  Due to the patient's failure of conservative treatment, his  weakness in his leg and MRI findings, the patient was recommended  laminectomy and microdiskectomy.  The risks and benefits have been explained  to the patient, who understands and agrees to proceed forward.   DESCRIPTION OF PROCEDURE:  The patient was brought to the OR and was induced  under general anesthesia and was  positioned prone on the Wilson frame.  The  back was prepped and draped in the usual sterile fashion.  Preoperative x-  ray localized the L4-5 disk space.  A midline incision was made, centering  over this localization film, and Bovie electrocautery was used to take it  down through the subcutaneous tissues.  Subperiosteal dissection was carried  out on the lamina of L4 and L5.  Intraoperative x-ray, however, confirmed  this to be the level above the L3-4, so attention was taken slightly  inferior to this, and the inferior aspect of the ligament of L4 and the  medial facet complex at L5 were drilled down.  Then, using 2- and 3-mm  Kerrison punches, the undersurface of the lamina of L4 was removed to the  medial facet complex, and virtually the entire lamina of L4 was removed to  gain access to the L4 pedicle.  This exposed the thecal sac and the  ligamentum flavum, which was noted to be markedly hypertrophied.  This was  teased away with a Penfield #4 and a blunt nerve hook and  removed in  piecemeal fashion with the 3-mm Kerrison punch.  At this point, the superior  aspect of the L5 lamina was removed, and the undersurface of the gutter  underbitten to expose the L5 pedicle and the L5 nerve root.  There was noted  marked stenosis from ligamentous hypertrophy and facet arthropathy at this  level, and this was all underbitten to decompress the lateral aspect of the  L5 nerve root.  Then, using a 4 Penfield, the disk space was identified.  Epidural veins were coagulated.  There was noted to be still a large  contained disk herniation at the level of the disk space.  Then, attention  was taken more superiorly to further identify the free fragment.  So, using  a blunt nerve hook and a 4 Penfield, the superior disk space and thecal sac  were dissected off the posterior wall of the L4 body.  Immediately  visualized were several large fragments of disk.  These were removed in  piecemeal fashion,  fishing them out with the blunt nerve hook, coronary  dilator and pituitary rongeurs.  Several large disk fragments were removed.  At this point, a large cavity was created and the L4 nerve root was  identified and palpated along the undersurface of the L4 pedicle and no  further fragments were appreciated.  Then, attention was taken back to the  disk space.  Annulotomy was made with an 11-blade scalpel.  A pituitary  rongeur was used to adequately clean out the disk space.  Several large  fragments of disk were removed from a separate compartment using a  combination of a downgoing Epstein curet and an upbiting pituitary and  straight pituitary.  The large osteophyte coming off the inferior aspect of  the L4 vertebral body was removed with an osteophyte remover, and then the  wound was reexplored with a coronary dilator and an angled hockey stick and  noted to have no further stenosis on both the 4 and the 5 roots.  So, at  this point, the wound was copiously irrigated and meticulous hemostasis was  maintained.  Gelfoam was laid on top of the dura.  The wound was closed in  layers with interrupted Vicryl, with a running 4-0 subcuticular in the skin.  Benzoin and Steri-Strips were applied.  The patient went to the recovery  room in stable condition.  At the end of the case, all sponge and needle  counts were correct.           ______________________________  Donalee Citrin, M.D.     GC/MEDQ  D:  04/28/2006  T:  04/28/2006  Job:  [id number]

## 2011-02-06 NOTE — Discharge Summary (Signed)
NAME:  Justin Key, Justin Key NO.:  [id number]   MEDICAL RECORD NO.:  [id number]          PATIENT TYPE:  INP   LOCATION:  3036                         FACILITY:  [facility]   PHYSICIAN:  Donalee Citrin, M.D.        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   PROCEDURE:  Reexploration of lumbar fusion at L4-S1 and posterior lumbar  interbody fusion at L2-3 and L3-4 with replacement and revision of  fusion at L4-S1.   HISTORY OF PRESENT ILLNESS:  The patient is a very pleasant 60 year old  gentleman who had undergone a previous lumbar fusion approximately a  year prior.  The patient had persistent and progressive bilateral leg  pain and was recommended re-exploration of his lumbar fusion with  extension to L2-L3 and L3-L4, hence, the patient was admitted to the  hospital and went to the operating room on __________ .  Postop, the  patient did very well.  Went to the recovery room and then to the floor.  On the floor, the patient was convulsing well, physical therapy was  working with him immediately.  Postoperatively, his pain was initially  very difficult to control; however, did rapidly improve with some  adjustment and the patient was stable and discharged home.  On hospital  day 3, he was discharged on OxyContin, oxycodone, and V  alium for pain  control.  The patient was examined, voiding spontaneously with complete  resolution of his leg pain at the time of discharge. The patient is to  be discharged to follow up in 2 weeks.           ______________________________  Donalee Citrin, M.D.     GC/MEDQ  D:  11/28/2008  T:  11/29/2008  Job:  [id number]

## 2011-02-06 NOTE — Discharge Summary (Signed)
NAME:  Justin Key, COTHRON NO.:  [id number]   MEDICAL RECORD NO.:  [id number]          PATIENT TYPE:  INP   LOCATION:  5154                         FACILITY:  [facility]   PHYSICIAN:  Donalee Citrin, M.D.        DATE OF BIRTH:  07-27-1951   DATE OF ADMISSION:  12/20/2008  DATE OF DISCHARGE:  12/23/2008                               DISCHARGE SUMMARY   ADMITTING DIAGNOSES:  Lumbar spinal stenosis, degenerative disk disease  L2-3 and L3-4 with instability and pseudoarthrosis at L4-S1.   PROCEDURE:  Re-exploration of lumbar fusion with repeat fusion of L4-S1  with decompressive laminectomies and fusion of L2-3 and L3-4.   HOSPITAL COURSE:  Postop, the patient did very well and recovering on  the floor.  On the floor, patient was ambulating.  During the first day,  however, had significant amount of pain control issues, took about 48  hours to get his pain under correct control.  At the time of hospital  life #3, the patient was neurologically stable, was tolerating his pain  medication management which included OxyContin, oxycodone, and Dilaudid,  and we are able to get an oral regimen working for him and was able to  be discharged home.  He was scheduled followup in approximately 2 weeks.  At the time of discharge, the patient was neurologically stable and  voiding spontaneously.           ______________________________  Donalee Citrin, M.D.     GC/MEDQ  D:  01/02/2009  T:  01/03/2009  Job:  [id number]

## 2011-02-06 NOTE — Op Note (Signed)
NAME:  Justin Key, Justin Key NO.:  [id number]   MEDICAL RECORD NO.:  [id number]          PATIENT TYPE:  AMB   LOCATION:  ENDO                         FACILITY:  [facility]   PHYSICIAN:  Georgiana Spinner, M.D.    DATE OF BIRTH:  05-23-1951   DATE OF PROCEDURE:  06/08/2005  DATE OF DISCHARGE:                                 OPERATIVE REPORT   PROCEDURE:  Colonoscopy with polypectomy.   INDICATIONS:  Colon polyp.   ANESTHESIA:  None further given.   PROCEDURE:  With patient mildly sedated in the left lateral decubitus  position, the Olympus videoscopic colonoscope was inserted into the rectum  and passed under direct vision after a normal rectal examination to the  cecum, identified by ileocecal valve and appendiceal orifice, both of which  were photographed.  From this point, the colonoscope was slowly withdrawn  taking circumferential views of colonic mucosa stopping only at  approximately 20 cm from the anal verge at which point a polyp was seen on a  large rod stalk.  This was photographed and then subsequently removed using  snare cautery technique setting of 20/20 blended current.  The endoscope was  then withdrawn further, taking circumferential views of the remaining  colonic mucosa, stopping to photograph diverticula in the sigmoid colon  until we reached the rectum which appeared normal in direct view other than  a scar from where we had removed the polyp last year and unremarkable on  retroflexed view as well.  The endoscope was straightened and withdrawn.  The patient's vital signs and pulse oximeter remained stable.  The patient  tolerated the procedure well without apparent complications.   FINDINGS:  Polyp at 20 cm from the anal verge, diverticulosis of sigmoid  colon.  Otherwise, an unremarkable examination at this time.   PLAN:  Await biopsy report.  The patient will call me for results and follow-  up with me as an outpatient.     ______________________________  Georgiana Spinner, M.D.     GMO/MEDQ  D:  06/08/2005  T:  06/08/2005  Job:  [id number]   cc:   [name], M.D.  [address]  [address] [place], [place] [address]  Fax: [phone]

## 2011-02-06 NOTE — Op Note (Signed)
NAME:  Justin Key, LOS NO.:  [id number]   MEDICAL RECORD NO.:  [id number]          PATIENT TYPE:  AMB   LOCATION:  SDS                          FACILITY:  [facility]   PHYSICIAN:  Donalee Citrin, M.D.        DATE OF BIRTH:  December 08, 1950   DATE OF PROCEDURE:  01/06/2007  DATE OF DISCHARGE:                               OPERATIVE REPORT   PREOPERATIVE DIAGNOSIS:  Recurrent disk herniation L4-5 in the setting  of a transitional segment.  This has been labeled L5-S1 on the MRI scan.  However, I believe this behaves more like L4-5 with a transitional level  or at least a L5-T1 disk space.   PROCEDURE:  Redo lumbar laminectomy and microdiskectomy at L4-5 with  microscopic dissection of the left L5 nerve root and microscopic  diskectomy.   SURGEON:  Donalee Citrin, M.D.   ASSISTANT:  Reinaldo Meeker, M.D.   HISTORY OF PRESENT ILLNESS:  The patient is a 60 year old gentleman who  underwent laminectomy approximately nine months ago and did very well.  However, three or four months postoperative, he started experiencing  progressive recurrence of left hip and leg pain.  This was initially  treated conservatively.  However, it progressed.  Repeat MRI scan showed  a large recurrence at the previous operative site.  The patient is  recommended laminectomy, microdiskectomy.  Again noted is the patient  does have a transitional segment.  This is the disk space above the  transitional level that I have called L4-5.  It has been called L5-S1 on  the MRI report at [facility].   OPERATIVE PROCEDURE:  The patient was brought to the OR, was induced  under anesthesia, placed prone on Wilson frame.  Back was prepped and  draped in sterile fashion.  His old incision was opened up after  infiltration with 10 mL lidocaine with epinephrine and Bovie cautery was  used to dissect the subcutaneous tissues.  Periosteal dissection was  carried out through the scar tissue, reexposing the lamina at  the  operative site.  Intraoperative x-ray confirmed localization at  appropriate level.  Then using a #4 Penfield and a #1 Penfield, scar  tissue was dissected off the superior aspect of the inferior lamina and  inferior aspect of superior lamina and all the scar was removed.  Then  using the #4 Penfield, the dura and scar was teased off of the  undersurface of the superior aspect of the inferior lamina and then  using a 2 mm and 3 mm Kerrison punch, the laminotomy was extended  slightly inferior to identify normal anatomy.  This was then carried out  laterally.  The pedicle was identified.  The nerve root was dissected  off the pedicle and then marching for the pedicle superiorly in the disk  space, initially what was immediately apparent was what appeared to be a  fairly sizeable disk herniation that was sitting just medial to the  facet complex.  This was removed with a nerve hook with ease.  Then as  the scar tissue was freed up from the undersurface of  the fiber,  the  large recurrent disk was immediately visualized, encased in scar and  this was removed with a nerve hook and pituitary rongeurs.  This allowed  further mobilization of the L5 root and the thecal sac medially.  Disk  space was entered pituitary rongeurs and cleaned out radically.  There  was no further discharge appreciated.  The  thecal sac and an L5 nerve  root and L5 pedicle were widely decompressed, explored with a hockey  stick and coronary dilator and has no further stenosis. both medially  and laterally as well as inferiorly.  Then the wound was copiously  irrigated.  Hemostasis was maintained.  Gelfoam was overlaid on top of  the dura.  The muscle fascia approximated with interrupted Vicryl and  skin was closed running 4-0 subcuticular.  The patient tolerated the  procedure well and returned to the recovery room in stable condition.  At the end of the case needle and sponge counts correct.            ______________________________  Donalee Citrin, M.D.     GC/MEDQ  D:  01/06/2007  T:  01/06/2007  Job:  [id number]

## 2011-02-06 NOTE — Op Note (Signed)
NAME:  Justin Key, Justin Key NO.:  [id number]   MEDICAL RECORD NO.:  [id number]          PATIENT TYPE:  AMB   LOCATION:  ENDO                         FACILITY:  [facility]   PHYSICIAN:  Georgiana Spinner, M.D.    DATE OF BIRTH:  03/29/1951   DATE OF PROCEDURE:  06/08/2005  DATE OF DISCHARGE:                                 OPERATIVE REPORT   PROCEDURE:  Upper endoscopy.   INDICATIONS:  GERD.   ANESTHESIA:  Demerol 80, Versed 7 mg.   PROCEDURE:  With patient mildly sedated in left lateral decubitus position,  the Olympus videoscopic endoscope was inserted and passed under direct  vision through the esophagus which appeared normal, into the stomach.  Fundus, body, antrum, duodenal bulb, second portion of duodenum appeared  normal.  From this point, the endoscope was slowly withdrawn taking  circumferential views of duodenal mucosa until the endoscope had been pulled  back into the stomach, placed in retroflexion to view the stomach from below  and on retroflexed view, there appeared to be an area that was inflamed,  could be Barrett's esophagus versus a localized area of esophagitis.  This  was photographed and biopsied in retroflexed view.  The endoscope was  straightened, withdrawn taking circumferential views of the remaining  gastric and esophageal mucosa.  The patient's vital signs and pulse oximeter  remained stable.  The patient tolerated the procedure well without apparent  complications.   FINDINGS:  Erythematous area of the distal esophagus; await biopsy report.  The patient will call me for results and follow-up with me as an outpatient.  Proceed to colonoscopy as planned.           ______________________________  Georgiana Spinner, M.D.     GMO/MEDQ  D:  06/08/2005  T:  06/08/2005  Job:  [id number]   cc:   [name], M.D.  [address]  [address] [place], [place] [address]  Fax: [phone]

## 2011-07-03 LAB — BASIC METABOLIC PANEL
BUN: 14
CO2: 30
GFR calc non Af Amer: >60
Glucose, Bld: 143 — ABNORMAL HIGH
Potassium: 5.3 — ABNORMAL HIGH

## 2011-07-03 LAB — CBC
HCT: 40.2
MCHC: 33.4
Platelets: 290
RDW: 13.6

## 2011-07-03 LAB — TYPE AND SCREEN

## 2012-07-12 ENCOUNTER — Encounter (HOSPITAL_COMMUNITY): Payer: Self-pay | Admitting: Anesthesiology

## 2012-07-12 ENCOUNTER — Ambulatory Visit (HOSPITAL_COMMUNITY)
Admission: RE | Admit: 2012-07-12 | Discharge: 2012-07-12 | Disposition: A | Discharge status: Home / Self Care | Payer: 59 | Source: Ambulatory Visit | Attending: Gastroenterology | Admitting: Gastroenterology

## 2012-07-12 ENCOUNTER — Encounter (HOSPITAL_COMMUNITY)
Admission: RE | Disposition: A | Discharge status: Home / Self Care | Payer: Self-pay | Source: Ambulatory Visit | Attending: Gastroenterology

## 2012-07-12 ENCOUNTER — Ambulatory Visit (HOSPITAL_COMMUNITY): Payer: 59 | Admitting: Anesthesiology

## 2012-07-12 ENCOUNTER — Encounter (HOSPITAL_COMMUNITY): Payer: Self-pay | Admitting: *Deleted

## 2012-07-12 DIAGNOSIS — I252 Old myocardial infarction: Secondary | ICD-10-CM | POA: Insufficient documentation

## 2012-07-12 DIAGNOSIS — K5730 Diverticulosis of large intestine without perforation or abscess without bleeding: Secondary | ICD-10-CM | POA: Insufficient documentation

## 2012-07-12 DIAGNOSIS — D126 Benign neoplasm of colon, unspecified: Secondary | ICD-10-CM | POA: Insufficient documentation

## 2012-07-12 DIAGNOSIS — D128 Benign neoplasm of rectum: Secondary | ICD-10-CM | POA: Insufficient documentation

## 2012-07-12 DIAGNOSIS — E119 Type 2 diabetes mellitus without complications: Secondary | ICD-10-CM | POA: Insufficient documentation

## 2012-07-12 DIAGNOSIS — Z79899 Other long term (current) drug therapy: Secondary | ICD-10-CM | POA: Insufficient documentation

## 2012-07-12 DIAGNOSIS — K219 Gastro-esophageal reflux disease without esophagitis: Secondary | ICD-10-CM | POA: Insufficient documentation

## 2012-07-12 HISTORY — DX: Type 2 diabetes mellitus without complications: E11.9

## 2012-07-12 HISTORY — PX: ESOPHAGOGASTRODUODENOSCOPY: SHX5428

## 2012-07-12 HISTORY — PX: COLONOSCOPY: SHX5424

## 2012-07-12 HISTORY — DX: Dorsalgia, unspecified: M54.9

## 2012-07-12 HISTORY — DX: Gastro-esophageal reflux disease without esophagitis: K21.9

## 2012-07-12 HISTORY — DX: Acute myocardial infarction, unspecified: I21.9

## 2012-07-12 SURGERY — COLONOSCOPY
Anesthesia: Monitor Anesthesia Care

## 2012-07-12 MED ORDER — PROPOFOL 10 MG/ML IV EMUL
INTRAVENOUS | Status: DC | PRN
  Administered 2012-07-12: 100 ug/kg/min via INTRAVENOUS

## 2012-07-12 MED ORDER — KETAMINE HCL 10 MG/ML IJ SOLN
INTRAMUSCULAR | Status: DC | PRN
  Administered 2012-07-12 (×2): 10 mg via INTRAVENOUS

## 2012-07-12 MED ORDER — LACTATED RINGERS IV SOLN
INTRAVENOUS | Status: DC
  Administered 2012-07-12: 1000 mL via INTRAVENOUS

## 2012-07-12 MED ORDER — SODIUM CHLORIDE 0.9 % IV SOLN: INTRAVENOUS | Status: DC

## 2012-07-12 MED ORDER — LACTATED RINGERS IV SOLN
INTRAVENOUS | Status: DC | PRN
  Administered 2012-07-12: 10:00:00 via INTRAVENOUS

## 2012-07-12 MED ORDER — MIDAZOLAM HCL 5 MG/5ML IJ SOLN
INTRAMUSCULAR | Status: DC | PRN
  Administered 2012-07-12: 2 mg via INTRAVENOUS

## 2012-07-12 MED ORDER — FENTANYL CITRATE 0.05 MG/ML IJ SOLN
INTRAMUSCULAR | Status: DC | PRN
  Administered 2012-07-12: 100 ug via INTRAVENOUS

## 2012-07-12 NOTE — Interval H&P Note (Signed)
History and Physical Interval Note:  07/12/2012 9:59 AM  Justin Key  has presented today for surgery, with the diagnosis of hx polyps, reflux, Barrett's esophagus  The various methods of treatment have been discussed with the patient and family. After consideration of risks, benefits and other options for treatment, the patient has consented to  Procedure(s) (LRB) with comments: COLONOSCOPY (N/A) ESOPHAGOGASTRODUODENOSCOPY (EGD) (N/A) as a surgical intervention .  The patient's history has been reviewed, patient examined, no change in status, stable for surgery.  I have reviewed the patient's chart and labs.  Questions were answered to the patient's satisfaction.     Marabelle Cushman C.

## 2012-07-12 NOTE — Op Note (Signed)
[facility] [address] [place], [address]   COLONOSCOPY PROCEDURE REPORT  PATIENT: [name], [name]  MR#: [id number] BIRTHDATE: [birth year] , 61  yrs. old GENDER: Male ENDOSCOPIST: Charlott Rakes, MD REFERRED AO:ZHYQ Hyacinth Meeker, M.D. PROCEDURE DATE:  07/12/2012 PROCEDURE:   Colonoscopy, surveillance ASA CLASS:   Class III INDICATIONS:follow up of adenomatous colonic polyp(s). MEDICATIONS: See Anesthesia Report.  DESCRIPTION OF PROCEDURE:   After the risks benefits and alternatives of the procedure were thoroughly explained, informed consent was obtained.       The Pentax Ped Colon [id number]  endoscope was introduced through the anus and advanced to the cecum, which was identified by both the appendix and ileocecal valve. No adverse events experienced.   The quality of the prep was fair.  The instrument was then slowly withdrawn as the colon was fully examined.    [      [    .  A rectal exam was unremarkable. A pediatric colonoscope was inserted into the fair prep colon and advanced to the cecum the ileocecal valve and appendiceal orifice were identified. On withdrawal an 8 mm sessile polyp was seen in the descending colon that was removed with snare cautery. A 4 mm flat polyp was removed with cold biopsy forceps. Scattered sigmoid diverticuli were noted. Six flat and semi-sessile polyps were seen in the rectosigmoid and rectum that were removed with cold biopsy with the largest being 3 mm in size. A 4 mm sessile polyp was removed with snare cautery in the rectosigmoid colon. Retroflexion revealed small internal hemorrhoids. The scope was withdrawn and the procedure completed. COMPLICATIONS: There were no complications.  ENDOSCOPIC IMPRESSION: Nine colon polyps -s/p polyp removal as stated above; Sigmoid diverticulosis; Small internal hemorrhoids   RECOMMENDATIONS:F/U on path; High fiber diet; No aspirin products for 2 weeks.   eSigned:  Charlott Rakes, MD 07/12/2012 11:36 AM   cc:   PATIENT NAME:  Justin Key, Justin Key MR#: [id number]

## 2012-07-12 NOTE — Transfer of Care (Signed)
Immediate Anesthesia Transfer of Care Note  Patient: Justin Key  Procedure(s) Performed: Procedure(s) (LRB) with comments: COLONOSCOPY (N/A) ESOPHAGOGASTRODUODENOSCOPY (EGD) (N/A)  Patient Location: PACU and Endoscopy Unit  Anesthesia Type: MAC  Level of Consciousness: awake, oriented and patient cooperative  Airway & Oxygen Therapy: Patient Spontanous Breathing and Patient connected to nasal cannula oxygen  Post-op Assessment: Report given to PACU RN, Post -op Vital signs reviewed and stable and Patient moving all extremities X 4  Post vital signs: Reviewed and stable  Complications: No apparent anesthesia complications

## 2012-07-12 NOTE — Anesthesia Preprocedure Evaluation (Addendum)
Anesthesia Evaluation  Patient identified by MRN, date of birth, ID band Patient awake    Reviewed: Allergy & Precautions, H&P , NPO status , Patient's Chart, lab work & pertinent test results  Airway Mallampati: II TM Distance: >3 FB Neck ROM: Full    Dental No notable dental hx.    Pulmonary neg pulmonary ROS,  breath sounds clear to auscultation  Pulmonary exam normal       Cardiovascular + Past MI Rhythm:Regular Rate:Normal     Neuro/Psych negative neurological ROS  negative psych ROS   GI/Hepatic Neg liver ROS, GERD-  Medicated,  Endo/Other  diabetes, Oral Hypoglycemic Agents  Renal/GU negative Renal ROS  negative genitourinary   Musculoskeletal negative musculoskeletal ROS (+)   Abdominal   Peds negative pediatric ROS (+)  Hematology negative hematology ROS (+)   Anesthesia Other Findings   Reproductive/Obstetrics negative OB ROS                        Anesthesia Physical Anesthesia Plan  ASA: III  Anesthesia Plan: MAC   Post-op Pain Management:    Induction: Intravenous  Airway Management Planned: Nasal Cannula  Additional Equipment:   Intra-op Plan:   Post-operative Plan:   Informed Consent: I have reviewed the patients History and Physical, chart, labs and discussed the procedure including the risks, benefits and alternatives for the proposed anesthesia with the patient or authorized representative who has indicated his/her understanding and acceptance.   Dental advisory given  Plan Discussed with: CRNA and Surgeon  Anesthesia Plan Comments:        Anesthesia Quick Evaluation

## 2012-07-12 NOTE — Anesthesia Postprocedure Evaluation (Signed)
  Anesthesia Post-op Note  Patient: Justin Key  Procedure(s) Performed: Procedure(s) (LRB): COLONOSCOPY (N/A) ESOPHAGOGASTRODUODENOSCOPY (EGD) (N/A)  Patient Location: PACU  Anesthesia Type: MAC  Level of Consciousness: awake and alert   Airway and Oxygen Therapy: Patient Spontanous Breathing  Post-op Pain: mild  Post-op Assessment: Post-op Vital signs reviewed, Patient's Cardiovascular Status Stable, Respiratory Function Stable, Patent Airway and No signs of Nausea or vomiting  Post-op Vital Signs: stable  Complications: No apparent anesthesia complications

## 2012-07-12 NOTE — H&P (Signed)
  Date of Initial H&P: 07/06/12  History reviewed, patient examined, no change in status, stable for surgery. Colonoscopy due to history of polyps. EGD due to history of Barrett's esophagus and GERD.

## 2012-07-12 NOTE — Op Note (Signed)
[facility] [address] [place], [address]   ENDOSCOPY PROCEDURE REPORT  PATIENT: Demetry, Parmeter  MR#: [id number] BIRTHDATE: October 01, 1950 , 61  yrs. old GENDER: Male  ENDOSCOPIST: Charlott Rakes, MD REFERRED XB:JYNW Hyacinth Meeker, M.D.  PROCEDURE DATE:  07/12/2012 PROCEDURE:   EGD, diagnostic ASA CLASS:   Class III INDICATIONS:history of esophageal reflux.   history of Barrett's esophagus. MEDICATIONS: See Anesthesia Report.  TOPICAL ANESTHETIC:  DESCRIPTION OF PROCEDURE:   After the risks benefits and alternatives of the procedure were thoroughly explained, informed consent was obtained.  The Pentax Gastroscope [id number]  endoscope was introduced through the mouth and advanced to the second portion of the duodenum , limited by Without limitations.   The instrument was slowly withdrawn as the mucosa was fully examined.     FINDINGS: The endoscope was inserted into the oropharynx and esophagus was intubated.  The gastroesophageal junction was noted to be 45 cm from the incisors.  Endoscope was advanced into the stomach, which revealed normal-appearing gastric mucosa.  The endoscope was advanced to the duodenal bulb and second portion of duodenum which were unremarkable.  The endoscope was withdrawn back into the stomach and retroflexion was done which revealed a normal proximal stomach.  COMPLICATIONS: None  ENDOSCOPIC IMPRESSION: Normal upper endoscopy   RECOMMENDATIONS:  Continue lifestyle modifications for gastroesophageal reflux disease; Continue daily PPI   REPEAT EXAM: N/A  _______________________________ Charlott Rakes, MD eSigned:  Charlott Rakes, MD 07/12/2012 11:22 AM    GN:FAOZ Hyacinth Meeker, MD  PATIENT NAME:  Tanis, Malczewski MR#: [id number]

## 2012-07-18 ENCOUNTER — Encounter (HOSPITAL_COMMUNITY): Payer: Self-pay | Admitting: Gastroenterology

## 2013-02-25 DIAGNOSIS — M76899 Other specified enthesopathies of unspecified lower limb, excluding foot: Secondary | ICD-10-CM | POA: Insufficient documentation

## 2013-02-25 DIAGNOSIS — M546 Pain in thoracic spine: Secondary | ICD-10-CM | POA: Insufficient documentation

## 2013-03-05 ENCOUNTER — Encounter (HOSPITAL_COMMUNITY): Payer: Self-pay | Admitting: *Deleted

## 2013-03-05 ENCOUNTER — Emergency Department (HOSPITAL_COMMUNITY): Payer: 59

## 2013-03-05 ENCOUNTER — Emergency Department (HOSPITAL_COMMUNITY)
Admission: EM | Admit: 2013-03-05 | Discharge: 2013-03-05 | Disposition: A | Discharge status: Home / Self Care | Payer: 59 | Attending: Emergency Medicine | Admitting: Emergency Medicine

## 2013-03-05 DIAGNOSIS — E119 Type 2 diabetes mellitus without complications: Secondary | ICD-10-CM | POA: Insufficient documentation

## 2013-03-05 DIAGNOSIS — G8929 Other chronic pain: Secondary | ICD-10-CM

## 2013-03-05 DIAGNOSIS — Z981 Arthrodesis status: Secondary | ICD-10-CM | POA: Insufficient documentation

## 2013-03-05 DIAGNOSIS — Z7982 Long term (current) use of aspirin: Secondary | ICD-10-CM | POA: Insufficient documentation

## 2013-03-05 DIAGNOSIS — R4182 Altered mental status, unspecified: Secondary | ICD-10-CM

## 2013-03-05 DIAGNOSIS — M549 Dorsalgia, unspecified: Secondary | ICD-10-CM | POA: Insufficient documentation

## 2013-03-05 DIAGNOSIS — I252 Old myocardial infarction: Secondary | ICD-10-CM | POA: Insufficient documentation

## 2013-03-05 DIAGNOSIS — K219 Gastro-esophageal reflux disease without esophagitis: Secondary | ICD-10-CM | POA: Insufficient documentation

## 2013-03-05 DIAGNOSIS — F22 Delusional disorders: Secondary | ICD-10-CM

## 2013-03-05 DIAGNOSIS — Z79899 Other long term (current) drug therapy: Secondary | ICD-10-CM | POA: Insufficient documentation

## 2013-03-05 DIAGNOSIS — F10920 Alcohol use, unspecified with intoxication, uncomplicated: Secondary | ICD-10-CM

## 2013-03-05 DIAGNOSIS — Z9889 Other specified postprocedural states: Secondary | ICD-10-CM | POA: Insufficient documentation

## 2013-03-05 DIAGNOSIS — Z87891 Personal history of nicotine dependence: Secondary | ICD-10-CM | POA: Insufficient documentation

## 2013-03-05 DIAGNOSIS — F1010 Alcohol abuse, uncomplicated: Secondary | ICD-10-CM | POA: Insufficient documentation

## 2013-03-05 LAB — CBC WITH DIFFERENTIAL/PLATELET
Basophils Absolute: 0 10*3/uL (ref 0.0–0.1)
Basophils Relative: 1 % (ref 0–1)
Eosinophils Absolute: 0.3 10*3/uL (ref 0.0–0.7)
Eosinophils Relative: 4 % (ref 0–5)
HCT: 37.3 % — ABNORMAL LOW (ref 39.0–52.0)
Hemoglobin: 13 g/dL (ref 13.0–17.0)
Lymphocytes Relative: 25 % (ref 12–46)
Lymphs Abs: 2 K/uL (ref 0.7–4.0)
MCH: 29.5 pg (ref 26.0–34.0)
MCHC: 34.9 g/dL (ref 30.0–36.0)
MCV: 84.8 fL (ref 78.0–100.0)
Monocytes Absolute: 0.6 10*3/uL (ref 0.1–1.0)
Monocytes Relative: 7 % (ref 3–12)
Neutro Abs: 5.1 K/uL (ref 1.7–7.7)
Neutrophils Relative %: 63 % (ref 43–77)
Platelets: 232 10*3/uL (ref 150–400)
RBC: 4.4 MIL/uL (ref 4.22–5.81)
RDW: 13.4 % (ref 11.5–15.5)
WBC: 8 10*3/uL (ref 4.0–10.5)

## 2013-03-05 LAB — COMPREHENSIVE METABOLIC PANEL
ALT: 20 U/L (ref 0–53)
AST: 25 U/L (ref 0–37)
Albumin: 3.5 g/dL (ref 3.5–5.2)
CO2: 20 mEq/L (ref 19–32)
Calcium: 9.2 mg/dL (ref 8.4–10.5)
Creatinine, Ser: 0.89 mg/dL (ref 0.50–1.35)
GFR calc non Af Amer: >90 mL/min (ref >90)
Sodium: 133 mEq/L — ABNORMAL LOW (ref 135–145)
Total Protein: 7 g/dL (ref 6.0–8.3)

## 2013-03-05 LAB — COMPREHENSIVE METABOLIC PANEL WITH GFR
Alkaline Phosphatase: 71 U/L (ref 39–117)
BUN: 14 mg/dL (ref 6–23)
Chloride: 98 meq/L (ref 96–112)
GFR calc Af Amer: >90 mL/min (ref >90)
Glucose, Bld: 208 mg/dL — ABNORMAL HIGH (ref 70–99)
Potassium: 4.7 meq/L (ref 3.5–5.1)
Total Bilirubin: 0.2 mg/dL — ABNORMAL LOW (ref 0.3–1.2)

## 2013-03-05 LAB — GLUCOSE, CAPILLARY: Glucose-Capillary: 176 mg/dL — ABNORMAL HIGH (ref 70–99)

## 2013-03-05 LAB — RAPID URINE DRUG SCREEN, HOSP PERFORMED
Amphetamines: NOT DETECTED
Barbiturates: NOT DETECTED
Benzodiazepines: POSITIVE — AB
Cocaine: NOT DETECTED
Opiates: NOT DETECTED
Tetrahydrocannabinol: NOT DETECTED

## 2013-03-05 LAB — ETHANOL: Alcohol, Ethyl (B): 156 mg/dL — ABNORMAL HIGH (ref 0–11)

## 2013-03-05 LAB — ACETAMINOPHEN LEVEL: Acetaminophen (Tylenol), Serum: <15 ug/mL (ref 10–30)

## 2013-03-05 MED ORDER — OXYCODONE HCL ER 10 MG PO T12A: 20.0000 mg | EXTENDED_RELEASE_TABLET | Freq: Two times a day (BID) | ORAL | Status: DC

## 2013-03-05 MED ORDER — ASPIRIN 325 MG PO TABS
325.0000 mg | ORAL_TABLET | Freq: Every morning | ORAL | Status: DC
  Administered 2013-03-05: 325 mg via ORAL
  Filled 2013-03-05: qty 1

## 2013-03-05 MED ORDER — ZIPRASIDONE MESYLATE 20 MG IM SOLR
20.0000 mg | Freq: Once | INTRAMUSCULAR | Status: DC
  Filled 2013-03-05: qty 20

## 2013-03-05 MED ORDER — ACETAMINOPHEN 325 MG PO TABS: 650.0000 mg | ORAL_TABLET | ORAL | Status: DC | PRN

## 2013-03-05 MED ORDER — GLIMEPIRIDE 4 MG PO TABS
4.0000 mg | ORAL_TABLET | Freq: Every day | ORAL | Status: DC
  Administered 2013-03-05: 4 mg via ORAL
  Filled 2013-03-05 (×3): qty 1

## 2013-03-05 MED ORDER — OXYCODONE HCL ER 10 MG PO T12A
20.0000 mg | EXTENDED_RELEASE_TABLET | Freq: Three times a day (TID) | ORAL | Status: DC
  Administered 2013-03-05: 20 mg via ORAL
  Filled 2013-03-05: qty 2

## 2013-03-05 MED ORDER — METFORMIN HCL 500 MG PO TABS
1000.0000 mg | ORAL_TABLET | Freq: Every day | ORAL | Status: DC
  Filled 2013-03-05: qty 2

## 2013-03-05 MED ORDER — ZIPRASIDONE MESYLATE 20 MG IM SOLR
20.0000 mg | Freq: Once | INTRAMUSCULAR | Status: AC
  Administered 2013-03-05: 20 mg via INTRAMUSCULAR

## 2013-03-05 MED ORDER — OXYCODONE HCL ER 20 MG PO T12A: 20.0000 mg | EXTENDED_RELEASE_TABLET | Freq: Three times a day (TID) | ORAL | Status: DC

## 2013-03-05 MED ORDER — METFORMIN HCL 500 MG PO TABS
1500.0000 mg | ORAL_TABLET | Freq: Every day | ORAL | Status: DC
Start: 2013-03-05 — End: 2013-03-05
  Administered 2013-03-05: 1500 mg via ORAL
  Filled 2013-03-05 (×2): qty 3

## 2013-03-05 MED ORDER — LORAZEPAM 1 MG PO TABS
1.0000 mg | ORAL_TABLET | Freq: Three times a day (TID) | ORAL | Status: DC | PRN
  Administered 2013-03-05: 1 mg via ORAL
  Filled 2013-03-05: qty 1

## 2013-03-05 MED ORDER — PANTOPRAZOLE SODIUM 40 MG PO TBEC
40.0000 mg | DELAYED_RELEASE_TABLET | Freq: Two times a day (BID) | ORAL | Status: DC
  Administered 2013-03-05: 40 mg via ORAL
  Filled 2013-03-05: qty 1

## 2013-03-05 NOTE — ED Notes (Signed)
Pt being argumentative with Theophilus Bones.  Pt stated that Thelma Barge RN is a wimp and that I needed to leave.  Other RN's, security and [employer] present.  Thelma Barge RN left pt's room

## 2013-03-05 NOTE — Consult Note (Signed)
Reason for Consult:  Evaluation for Inpatient treatment Referring Physician: EDP  Justin Key is an 62 y.o. male.  HPI: Patient presented to [facility] related to having flashbacks of [place] war.  Patient states that he was having flashbacks of [place] war.  States that this is the first incident of flashbacks.  States that he has no previous psychiatric history.  Patient states that he does drink alcohol but denies daily use and denies the use of illicit drugs.  Patient states that the only medication he takes is for his GERD, diabetes, and back pain.   Patient denies SI/HI/Psychosis/Paranoia.   CW spoke to patients wife and states that she feels that husband (patient) is drinking more than admitting to.  States that she does feel that patient is harmful to self or anyone else.  States that there is a gun in house but she is able to secure the gun form patient.     Past Medical History  Diagnosis Date  . Myocardial infarction 1997  . Back pain   . GERD (gastroesophageal reflux disease)   . Diabetes mellitus without complication     Past Surgical History  Procedure Laterality Date  . Back surgery      microdiskectomy x2  . Spinal fusion      x 3  . Knee arthroscopy  1988    Left  . Spinal cord stimulator implant    . Spinal cord stimulator removal    . Colonoscopy  07/12/2012    Procedure: COLONOSCOPY;  Surgeon: Shirley Friar, MD;  Location: [facility];  Service: Endoscopy;  Laterality: N/A;  . Esophagogastroduodenoscopy  07/12/2012    Procedure: ESOPHAGOGASTRODUODENOSCOPY (EGD);  Surgeon: Shirley Friar, MD;  Location: [facility];  Service: Endoscopy;  Laterality: N/A;    History reviewed. No pertinent family history.  Social History:  reports that he quit smoking about 10 months ago. He does not have any smokeless tobacco history on file. He reports that  drinks alcohol. He reports that he does not use illicit drugs.  Allergies: No Known Allergies  Medications:  I have reviewed the patient's current medications.  Results for orders placed during the hospital encounter of 03/05/13 (from the past 48 hour(s))  CBC WITH DIFFERENTIAL     Status: Abnormal   Collection Time    03/05/13  3:00 AM      Result Value Range   WBC 8.0  4.0 - 10.5 K/uL   RBC 4.40  4.22 - 5.81 MIL/uL   Hemoglobin 13.0  13.0 - 17.0 g/dL   HCT 45.4 (*) 09.8 - 11.9 %   MCV 84.8  78.0 - 100.0 fL   MCH 29.5  26.0 - 34.0 pg   MCHC 34.9  30.0 - 36.0 g/dL   RDW 14.7  82.9 - 56.2 %   Platelets 232  150 - 400 K/uL   Neutrophils Relative % 63  43 - 77 %   Neutro Abs 5.1  1.7 - 7.7 K/uL   Lymphocytes Relative 25  12 - 46 %   Lymphs Abs 2.0  0.7 - 4.0 K/uL   Monocytes Relative 7  3 - 12 %   Monocytes Absolute 0.6  0.1 - 1.0 K/uL   Eosinophils Relative 4  0 - 5 %   Eosinophils Absolute 0.3  0.0 - 0.7 K/uL   Basophils Relative 1  0 - 1 %   Basophils Absolute 0.0  0.0 - 0.1 K/uL  COMPREHENSIVE METABOLIC PANEL  Status: Abnormal   Collection Time    03/05/13  3:00 AM      Result Value Range   Sodium 133 (*) 135 - 145 mEq/L   Potassium 4.7  3.5 - 5.1 mEq/L   Comment: SLIGHT HEMOLYSIS   Chloride 98  96 - 112 mEq/L   CO2 20  19 - 32 mEq/L   Glucose, Bld 208 (*) 70 - 99 mg/dL   BUN 14  6 - 23 mg/dL   Creatinine, Ser 1.61  0.50 - 1.35 mg/dL   Calcium 9.2  8.4 - 09.6 mg/dL   Total Protein 7.0  6.0 - 8.3 g/dL   Albumin 3.5  3.5 - 5.2 g/dL   AST 25  0 - 37 U/L   ALT 20  0 - 53 U/L   Alkaline Phosphatase 71  39 - 117 U/L   Total Bilirubin 0.2 (*) 0.3 - 1.2 mg/dL   GFR calc non Af Amer >90  >90 mL/min   GFR calc Af Amer >90  >90 mL/min   Comment:            The eGFR has been calculated     using the CKD EPI equation.     This calculation has not been     validated in all clinical     situations.     eGFR's persistently     <90 mL/min signify     possible Chronic Kidney Disease.  ETHANOL     Status: Abnormal   Collection Time    03/05/13  3:00 AM      Result Value Range    Alcohol, Ethyl (B) 156 (*) 0 - 11 mg/dL   Comment:            LOWEST DETECTABLE LIMIT FOR     SERUM ALCOHOL IS 11 mg/dL     FOR MEDICAL PURPOSES ONLY  ACETAMINOPHEN LEVEL     Status: None   Collection Time    03/05/13  3:00 AM      Result Value Range   Acetaminophen (Tylenol), Serum <15.0  10 - 30 ug/mL   Comment:            THERAPEUTIC CONCENTRATIONS VARY     SIGNIFICANTLY. A RANGE OF 10-30     ug/mL MAY BE AN EFFECTIVE     CONCENTRATION FOR MANY PATIENTS.     HOWEVER, SOME ARE BEST TREATED     AT CONCENTRATIONS OUTSIDE THIS     RANGE.     ACETAMINOPHEN CONCENTRATIONS     >150 ug/mL AT 4 HOURS AFTER     INGESTION AND >50 ug/mL AT 12     HOURS AFTER INGESTION ARE     OFTEN ASSOCIATED WITH TOXIC     REACTIONS.  URINE RAPID DRUG SCREEN (HOSP PERFORMED)     Status: Abnormal   Collection Time    03/05/13  5:00 AM      Result Value Range   Opiates NONE DETECTED  NONE DETECTED   Cocaine NONE DETECTED  NONE DETECTED   Benzodiazepines POSITIVE (*) NONE DETECTED   Amphetamines NONE DETECTED  NONE DETECTED   Tetrahydrocannabinol NONE DETECTED  NONE DETECTED   Barbiturates NONE DETECTED  NONE DETECTED   Comment:            DRUG SCREEN FOR MEDICAL PURPOSES     ONLY.  IF CONFIRMATION IS NEEDED     FOR ANY PURPOSE, NOTIFY LAB     WITHIN 5 DAYS.  LOWEST DETECTABLE LIMITS     FOR URINE DRUG SCREEN     Drug Class       Cutoff (ng/mL)     Amphetamine      1000     Barbiturate      200     Benzodiazepine   200     Tricyclics       300     Opiates          300     Cocaine          300     THC              50  GLUCOSE, CAPILLARY     Status: Abnormal   Collection Time    03/05/13  6:58 AM      Result Value Range   Glucose-Capillary 176 (*) 70 - 99 mg/dL    Ct Head Wo Contrast  03/05/2013   *RADIOLOGY REPORT*  Clinical Data:  Altered mental status.  CT HEAD WITHOUT CONTRAST  Technique: Contiguous axial images were obtained from the base of the skull through the vertex  without contrast  Comparison: 12/21/2008  Findings:  There is no evidence of intracranial hemorrhage, brain edema, or other signs of acute infarction.  There is no evidence of intracranial mass lesion or mass effect.  No abnormal extraaxial fluid collections are identified.  There is no evidence of hydrocephalus, or other significant intracranial abnormality.  No skull abnormality identified.  IMPRESSION: Negative non-contrast head CT.   Original Report Authenticated By: Myles Rosenthal, M.D.    Review of Systems  Psychiatric/Behavioral: Positive for suicidal ideas (ETOH). Negative for depression, hallucinations, memory loss and substance abuse. The patient is not nervous/anxious and does not have insomnia.        Patient states that he has 4 yrs in [place] and was having flashbacks.  Patient denies SI/HI/AVH/Paranoia.  Patient states that he has never experienced flashbacks in the past and has never had any type of psychiatric illness or treatment before.     Blood pressure 109/55, pulse 81, temperature 98.2 F (36.8 C), temperature source Oral, resp. rate 18, SpO2 99.00%. Physical Exam  Constitutional: He is oriented to person, place, and time. He appears well-developed and well-nourished.  HENT:  Head: Normocephalic.  Eyes: Pupils are equal, round, and reactive to light.  Neck: Normal range of motion.  Cardiovascular: Normal rate.   Respiratory: Effort normal.  Neurological: He is alert and oriented to person, place, and time.  Skin: Skin is warm and dry.  Psychiatric: He has a normal mood and affect. His speech is normal and behavior is normal. Judgment normal. Thought content is not paranoid and not delusional. Cognition and memory are normal. He expresses no homicidal and no suicidal ideation.    Assessment/Plan:  Face to face interview and consulted with Dr Theotis Barrio Recommendation:  Discharge home Resource information for AAA and therapy  Rankin, Shuvon FNP-BC 03/05/2013, 10:38 AM

## 2013-03-05 NOTE — ED Notes (Signed)
Family at bedside. 

## 2013-03-05 NOTE — ED Notes (Addendum)
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  Per pt family pt served in the [place] during the [place] war but was not actually in country.  Pt has a hx of chronic back pain which he takes OxyContin IR and ER for.  Pt's family indicates he drinks alcohol in excess.  Family member indicates that this type of behavior has not happened previously.Per EMS given 250 mcg of fentanyl.

## 2013-03-05 NOTE — ED Notes (Signed)
Pt refusing to be put on CCM at this time

## 2013-03-05 NOTE — BHH Counselor (Signed)
Magistrate Talbert Nan confirmed receipt of faxed IVC paperwork. Copies placed in pt's chart and originals placed in IVC log in Psych ED.   Evette Cristal, [place] Assessment Counselor

## 2013-03-05 NOTE — ED Notes (Signed)
Pt's wife, Marquest Gunkel cell phone number is [phone].  She has requested she be present when pt has his psychological evaluation.  Wife was informed that this could be possible if pt gives permission.
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
## 2013-03-05 NOTE — ED Notes (Signed)
Per charge pts safety sitter discontinued. Will reassess if need be.

## 2013-03-05 NOTE — ED Notes (Signed)
[employer] asked that pt be arouse so that they can serve him court papers

## 2013-03-05 NOTE — ED Notes (Signed)
Wife here to pick up patient.

## 2013-03-05 NOTE — BH Assessment (Signed)
Assessment Note   Justin Key is an 62 y.o. male.   Pt presents in ED after becoming combative with [employer] at [facility] and making statements about [place].  Pt when arrived in ED became combative again.  Pt during the assessment denies SI, HI and AVH and SA issues.  Pt admits to using alcohol but not that it is a problem.  Pt denies hx of any inpatient care and denies any intent or desire or thoughts of wanting to hurt self or others.  Pt reports being confused about the sequence of what happened last night but can recall the broad picture and provided details as he remembered.  Pt report the [employer] did not mistreat him and he has not idea why he responded the way he did.  Pt was cooperative, apologetic and appeared to be confused about his behavior.  ACT spoke with the pt spouse.  Pt report marriage is good.  Spouse reports the same.  Spouse reports she has never seen or heard of anything with pt that gives any insight into last night except her suspicion pt drinks more alcohol than she is aware.  Spouse denies any concerns about safety for pt to return home and she herself has no safety concerns.    MSE by ACT:  Fair eye contact, speech soft but clear, affect within appropriate range, pt Ox3, CIWA 0, no visible distress by ACT  Recommendation:  Pt does not appear to meet current critiera for IVC as he did last night and MD will determine after evaluation.  If pt is discharge he will be encouraged to follow up with primary care physician and to consider minimizing alcohol consumption.  Appropriate therapy resources will be given.  Axis I: Substance Induced Mood Disorder Axis II: Deferred Axis III:  Past Medical History  Diagnosis Date  . Myocardial infarction 1997  . Back pain   . GERD (gastroesophageal reflux disease)   . Diabetes mellitus without complication    Axis IV: other psychosocial or environmental problems Axis V: 51-60 moderate symptoms  Past Medical History:  Past  Medical History  Diagnosis Date  . Myocardial infarction 1997  . Back pain   . GERD (gastroesophageal reflux disease)   . Diabetes mellitus without complication     Past Surgical History  Procedure Laterality Date  . Back surgery      microdiskectomy x2  . Spinal fusion      x 3  . Knee arthroscopy  1988    Left  . Spinal cord stimulator implant    . Spinal cord stimulator removal    . Colonoscopy  07/12/2012    Procedure: COLONOSCOPY;  Surgeon: Shirley Friar, MD;  [facility];  Service: Endoscopy;  Laterality: N/A;  . Esophagogastroduodenoscopy  07/12/2012    Procedure: ESOPHAGOGASTRODUODENOSCOPY (EGD);  Surgeon: Shirley Friar, MD;  Location: [facility];  Service: Endoscopy;  Laterality: N/A;    Family History: History reviewed. No pertinent family history.  Social History:  reports that he quit smoking about 10 months ago. He does not have any smokeless tobacco history on file. He reports that  drinks alcohol. He reports that he does not use illicit drugs.  Additional Social History:  Alcohol / Drug Use Pain Medications: na Prescriptions: na Over the Counter: na History of alcohol / drug use?: Yes Substance #1 Name of Substance 1: alcohol 1 - Age of First Use: teen 1 - Amount (size/oz): varies 1 - Frequency: 3x per wk 1 -  Duration: ongoing 1 - Last Use / Amount: 03-05-13  CIWA: CIWA-Ar BP: 109/55 mmHg Pulse Rate: 81 Nausea and Vomiting: no nausea and no vomiting Tactile Disturbances: none Tremor: no tremor Auditory Disturbances: not present Paroxysmal Sweats: no sweat visible Visual Disturbances: not present Anxiety: no anxiety, at ease Headache, Fullness in Head: none present Agitation: normal activity Orientation and Clouding of Sensorium: oriented and can do serial additions CIWA-Ar Total: 0 COWS:    Allergies: No Known Allergies  Home Medications:  (Not in a hospital admission)  OB/GYN Status:  No LMP for male  patient.  General Assessment Data Location of Assessment: [facility] Living Arrangements: Spouse/significant other Can pt return to current living arrangement?: Yes Admission Status: Involuntary Is patient capable of signing voluntary admission?: Yes Transfer from: Acute Hospital Referral Source: MD  Education Status Is patient currently in school?: No  Risk to self Suicidal Ideation: No Suicidal Intent: No Is patient at risk for suicide?: No Suicidal Plan?: No Access to Means: No What has been your use of drugs/alcohol within the last 12 months?: alcohol Previous Attempts/Gestures: No How many times?: 0 Other Self Harm Risks: na Triggers for Past Attempts: None known Intentional Self Injurious Behavior: None Family Suicide History: No Recent stressful life event(s): Other (Comment) (alcohol use) Persecutory voices/beliefs?: No Depression: No Depression Symptoms: Feeling angry/irritable (alcohol use) Substance abuse history and/or treatment for substance abuse?: No Suicide prevention information given to non-admitted patients: Yes  Risk to Others Homicidal Ideation: No Thoughts of Harm to Others: No Current Homicidal Intent: No Current Homicidal Plan: No Access to Homicidal Means: No Identified Victim: none History of harm to others?: No Assessment of Violence: None Noted (pt aggressive when arrived but cooperative when assessed) Violent Behavior Description: cooperative with ACT; combative when he arrived Does patient have access to weapons?: Yes (Comment) (wife confirms a gun in house) Criminal Charges Pending?: No Does patient have a court date: No  Psychosis Hallucinations: None noted Delusions: None noted  Mental Status Report Appear/Hygiene: Other (Comment) (casual) Eye Contact: Fair (eating breakfast) Motor Activity: Unremarkable Speech: Logical/coherent;Soft Level of Consciousness: Quiet/awake Mood: Other (Comment) (appropriate and cooperative) Affect:  Appropriate to circumstance Anxiety Level: None Thought Processes: Coherent Judgement: Unimpaired (at ACT assessed judgement appears unimpaired ) Orientation: Person;Place;Appropriate for developmental age (still confused about the situation) Obsessive Compulsive Thoughts/Behaviors: None  Cognitive Functioning Concentration: Decreased Memory: Recent Impaired;Remote Intact (remembers but is confused about sequence) IQ: Average Insight: Fair Impulse Control: Fair Appetite: Fair Weight Loss: 0 Weight Gain: 0 Sleep: No Change Total Hours of Sleep: 6 Vegetative Symptoms: None  ADLScreening [facility] Assessment Services) Patient's cognitive ability adequate to safely complete daily activities?: Yes Patient able to express need for assistance with ADLs?: Yes Independently performs ADLs?: Yes (appropriate for developmental age)  Abuse/Neglect [facility]) Physical Abuse: Denies Verbal Abuse: Denies Sexual Abuse: Denies  Prior Inpatient Therapy Prior Inpatient Therapy: No Prior Therapy Dates: na Prior Therapy Facilty/Provider(s): na Reason for Treatment: na  Prior Outpatient Therapy Prior Outpatient Therapy: No Prior Therapy Dates: na Prior Therapy Facilty/Provider(s): na Reason for Treatment: na  ADL Screening (condition at time of admission) Patient's cognitive ability adequate to safely complete daily activities?: Yes Patient able to express need for assistance with ADLs?: Yes Independently performs ADLs?: Yes (appropriate for developmental age) Weakness of Legs: None Weakness of Arms/Hands: None  Home Assistive Devices/Equipment Home Assistive Devices/Equipment: None  Therapy Consults (therapy consults require a physician order) PT Evaluation Needed: No OT Evalulation Needed: No SLP Evaluation Needed: No  Abuse/Neglect Assessment (Assessment to be complete while patient is alone) Physical Abuse: Denies Verbal Abuse: Denies Sexual Abuse: Denies Exploitation of  patient/patient's resources: Denies Self-Neglect: Denies Values / Beliefs Cultural Requests During Hospitalization: None Spiritual Requests During Hospitalization: None Consults Spiritual Care Consult Needed: No Social Work Consult Needed: No Merchant navy officer (For Healthcare) Advance Directive: Patient does not have advance directive Pre-existing out of facility DNR order (yellow form or pink MOST form): No    Additional Information 1:1 In Past 12 Months?: No CIRT Risk: No Elopement Risk: No Does patient have medical clearance?: No     Disposition:  Disposition Initial Assessment Completed for this Encounter: Yes Disposition of Patient: Referred to (primary care physician) Patient referred to: Other (Comment) (referred to primary care physician)  On Site Evaluation by:   Reviewed with Physician:     Titus Mould, Eppie Gibson 03/05/2013 10:22 AM
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
## 2013-03-05 NOTE — ED Notes (Signed)
Pt given 5 mg of midazolam given to pt by EMS in route to facility
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
## 2013-03-05 NOTE — ED Notes (Signed)
Pt now contracts for safety of staff and self. Wife at bedside. Pt now on CCM.

## 2013-03-05 NOTE — ED Notes (Signed)
Pt continually calling staff and [facility] names.

## 2013-03-05 NOTE — ED Notes (Signed)
Pt continues to refuse to answer question regarding if he took his medications for DM
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
## 2013-03-05 NOTE — ED Notes (Signed)
Patient easily arousabl. Breakfast at bedside, has not touched yet.

## 2013-03-05 NOTE — ED Notes (Signed)
Bed:WA21<BR> Expected date:<BR> Expected time:<BR> Means of arrival:<BR> Comments:<BR>

## 2013-03-05 NOTE — ED Notes (Signed)
Pt continues to refuse to maintain continuous pulse ox

## 2013-03-05 NOTE — ED Notes (Signed)
Pt talking with Paige with ACT. Pt continues to curse and call staff names. Field seismologist "slut"

## 2013-03-05 NOTE — ED Notes (Signed)
Able to ambulate, a little unsteady  on feet.

## 2013-03-05 NOTE — ED Notes (Signed)
Patient is resting comfortably. 

## 2013-03-05 NOTE — ED Provider Notes (Signed)
History     CSN: [id number]  Arrival date & time 03/05/13  0151   First MD Initiated Contact with Patient 03/05/13 [phone]      Chief Complaint  Patient presents with  . Medical Clearance    (Consider location/radiation/quality/duration/timing/severity/associated sxs/prior treatment) HPI Comments: Pt admits to having been drinking alcohol.  Pt's spouse reports she was called this evening after his car broke down, was going to go to the [employer].  She got gas and jumper cables as he wanted to start the car himself.  When she arrived, a police officer was helping patient, who became more belligerent, was apparently talking about the [place] War and having "flashbacks."  However spouse reports he was never in [place], was in [place] at the time.  She thinks he has been depressed about his back problems, but never has had "flahsbacks" in the past.  He was restrained in the ED upon arrival due to combativeness.  He has a sig h/o DM.  He currently denies HA, CP, SOB, N/V.  He has some recollection of the events tonight, essentially shrugs when I ask if he recalls talking about [place].  He denies currently being depressed, angry, suicidal.  He admits to drinking, denies drugs, denies trauma.    Patient is a 62 y.o. male presenting with altered mental status. The history is provided by the patient, the spouse and the EMS personnel.  Altered Mental Status Presenting symptoms: behavior changes and combativeness   Severity:  Severe Most recent episode:  Today Episode history:  Single Duration:  1 hour Timing:  Constant Progression:  Improving Chronicity:  New Context: alcohol use     Past Medical History  Diagnosis Date  . Myocardial infarction 1997  . Back pain   . GERD (gastroesophageal reflux disease)   . Diabetes mellitus without complication     Past Surgical History  Procedure Laterality Date  . Back surgery      microdiskectomy x2  . Spinal fusion      x 3  . Knee  arthroscopy  1988    Left  . Spinal cord stimulator implant    . Spinal cord stimulator removal    . Colonoscopy  07/12/2012    Procedure: COLONOSCOPY;  Surgeon: Shirley Friar, MD;  Location: [facility];  Service: Endoscopy;  Laterality: N/A;  . Esophagogastroduodenoscopy  07/12/2012    Procedure: ESOPHAGOGASTRODUODENOSCOPY (EGD);  Surgeon: Shirley Friar, MD;  Location: [facility];  Service: Endoscopy;  Laterality: N/A;    History reviewed. No pertinent family history.  History  Substance Use Topics  . Smoking status: Former Smoker    Quit date: 04/09/2012  . Smokeless tobacco: Not on file  . Alcohol Use: Yes      Review of Systems  Unable to perform ROS: Mental status change  Psychiatric/Behavioral: Positive for altered mental status.    Allergies  Review of patient's allergies indicates no known allergies.  Home Medications   Current Outpatient Rx  Name  Route  Sig  Dispense  Refill  . aspirin 325 MG tablet   Oral   Take 325 mg by mouth every morning.          Marland Kitchen glimepiride (AMARYL) 4 MG tablet   Oral   Take 4 mg by mouth daily with breakfast.          . metFORMIN (GLUCOPHAGE) 500 MG tablet   Oral   Take 1,000-1,500 mg by mouth 2 (two) times daily with a meal.  Take 1500mg  in the morning and 1000mg  in the evening         . oxyCODONE (OXY IR/ROXICODONE) 5 MG immediate release tablet   Oral   Take 10 mg by mouth 3 (three) times daily as needed for pain.         Marland Kitchen oxyCODONE (OXYCONTIN) 20 MG 12 hr tablet   Oral   Take 20 mg by mouth every 8 (eight) hours.          . pantoprazole (PROTONIX) 20 MG tablet   Oral   Take 40 mg by mouth 2 (two) times daily.            BP 138/67  Pulse 90  Temp(Src) 98.1 F (36.7 C) (Oral)  Resp 18  SpO2 94%  Physical Exam  Nursing note and vitals reviewed. Constitutional: He appears well-developed and well-nourished. No distress.  HENT:  Head: Normocephalic and atraumatic.  Eyes: Right  conjunctiva is injected. Left conjunctiva is injected. No scleral icterus.  Neck: Neck supple.  Cardiovascular: Normal rate, regular rhythm and intact distal pulses.   No murmur heard. Pulmonary/Chest: Effort normal. No respiratory distress. He has no wheezes.  Abdominal: Soft. He exhibits no distension. There is no tenderness.  Musculoskeletal: He exhibits no edema.  Neurological: He is alert. He exhibits normal muscle tone. Coordination normal.  Skin: Skin is warm and dry. No rash noted. He is not diaphoretic. No pallor.  Psychiatric: His mood appears not anxious. His affect is labile. His affect is not angry. His speech is delayed and slurred. He is slowed. He is not aggressive and not combative. He does not exhibit a depressed mood. He expresses no homicidal and no suicidal ideation. He exhibits abnormal recent memory.    ED Course  Procedures (including critical care time)  Labs Reviewed  CBC WITH DIFFERENTIAL - Abnormal; Notable for the following:    HCT 37.3 (*)    All other components within normal limits  COMPREHENSIVE METABOLIC PANEL - Abnormal; Notable for the following:    Sodium 133 (*)    Glucose, Bld 208 (*)    Total Bilirubin 0.2 (*)    All other components within normal limits  ETHANOL - Abnormal; Notable for the following:    Alcohol, Ethyl (B) 156 (*)    All other components within normal limits  URINE RAPID DRUG SCREEN (HOSP PERFORMED) - Abnormal; Notable for the following:    Benzodiazepines POSITIVE (*)    All other components within normal limits  ACETAMINOPHEN LEVEL   No results found.   1. Altered mental status   2. Delusions   3. Chronic back pain   4. Acute alcohol intoxication, uncomplicated     ra sat is 97% and I interpret to be normal  4:33 AM Pt began complaining to RN about his chronic back pain, takes oxycodone TID, did not have his night time dose.  He then wanted to leave AMA.  When I approached him to assess his competency, he was  argumentative, threatening and would not answer my questions about his erratic behavior earlier this evening and delusional statements.  Pt continued to be argumentative, I had explained to him why I needed to assess his level of competency in order to release him and he continued to refuse.  Therefore, I will hold him involuntarily and have ACT or Telepysch evaluate pt.  ETOH is elevated bu only at 153.  No evidence of withdrawal symptoms with tremors, vomiting, abd pain.  Pt's spouse agrees  that pt's behavior and earlier statements are not his usual behavior.     6:00 AM Pt received geodon, behavior is reportedly more calm and cooperative.    MDM  Pt likely intoxicated, behavior and mentation due to same.  Pt seems to be improving between description of events by spouse and currently.  At this point, pt is not SI, HI, denies delusions or hallucinations with no prior history per spouse.  Will monitor for improvement of symptoms, labs are pending including drug screen and ETOH.          Gavin Pound. Oletta Lamas, MD 03/05/13 [phone]

## 2013-03-05 NOTE — ED Notes (Signed)
Patient states that is  Cramping because he has not had his oxycontin. Patient is cooperative right now. Breakfast ordered.

## 2013-03-05 NOTE — ED Notes (Signed)
Geodon given, pt very beligerant with staff, pt threatening staff, AC and [employer]. Pt pulled out IV, ripped off arm band.

## 2013-03-05 NOTE — ED Notes (Signed)
Pt continues to curse at staff, calling staff names. Field seismologist a "white bitch"

## 2013-06-26 DIAGNOSIS — M961 Postlaminectomy syndrome, not elsewhere classified: Secondary | ICD-10-CM | POA: Insufficient documentation

## 2013-07-27 ENCOUNTER — Other Ambulatory Visit: Payer: Self-pay

## 2013-08-25 DIAGNOSIS — M5126 Other intervertebral disc displacement, lumbar region: Secondary | ICD-10-CM | POA: Insufficient documentation

## 2014-07-06 ENCOUNTER — Other Ambulatory Visit: Payer: Self-pay

## 2015-02-08 ENCOUNTER — Other Ambulatory Visit: Payer: Self-pay | Admitting: Gastroenterology

## 2015-05-31 DIAGNOSIS — F32A Depression, unspecified: Secondary | ICD-10-CM | POA: Insufficient documentation

## 2015-12-31 DIAGNOSIS — M51379 Other intervertebral disc degeneration, lumbosacral region without mention of lumbar back pain or lower extremity pain: Secondary | ICD-10-CM | POA: Insufficient documentation

## 2015-12-31 DIAGNOSIS — M5137 Other intervertebral disc degeneration, lumbosacral region: Secondary | ICD-10-CM | POA: Insufficient documentation

## 2016-01-10 DIAGNOSIS — Z209 Contact with and (suspected) exposure to unspecified communicable disease: Secondary | ICD-10-CM | POA: Diagnosis not present

## 2016-01-10 DIAGNOSIS — R05 Cough: Secondary | ICD-10-CM | POA: Diagnosis not present

## 2016-01-10 DIAGNOSIS — Z125 Encounter for screening for malignant neoplasm of prostate: Secondary | ICD-10-CM | POA: Diagnosis not present

## 2016-01-10 DIAGNOSIS — Z7984 Long term (current) use of oral hypoglycemic drugs: Secondary | ICD-10-CM | POA: Diagnosis not present

## 2016-01-10 DIAGNOSIS — E119 Type 2 diabetes mellitus without complications: Secondary | ICD-10-CM | POA: Diagnosis not present

## 2016-01-10 MED FILL — AZITHROMYCIN 250 MG TABLET: 250 | 5 days supply | Qty: 6 | Fill #0

## 2016-01-13 MED FILL — GLIMEPIRIDE 4 MG TABLET: 4 | 90 days supply | Qty: 180 | Fill #0

## 2016-01-13 MED FILL — JANUVIA 100 MG TABLET: 100 | 90 days supply | Qty: 90 | Fill #0

## 2016-01-16 MED FILL — ONE TOUCH DELICA 33G LANCET: 50 days supply | Qty: 100 | Fill #0

## 2016-01-16 MED FILL — ONE TOUCH ULTRA TEST STRIPS: 25 days supply | Qty: 50 | Fill #0

## 2016-01-16 MED FILL — PANTOPRAZOLE SOD DR 40 MG T: 40 | 90 days supply | Qty: 180 | Fill #0

## 2016-01-16 MED FILL — metFORMIN HCL 1000 MG TABS: 1000 | 90 days supply | Qty: 225 | Fill #0

## 2016-01-23 MED FILL — AZITHROMYCIN 250 MG TABLET: 250 | 5 days supply | Qty: 6 | Fill #0

## 2016-06-24 DIAGNOSIS — M47814 Spondylosis without myelopathy or radiculopathy, thoracic region: Secondary | ICD-10-CM | POA: Insufficient documentation

## 2017-01-04 DIAGNOSIS — R351 Nocturia: Secondary | ICD-10-CM | POA: Diagnosis not present

## 2017-01-04 DIAGNOSIS — N281 Cyst of kidney, acquired: Secondary | ICD-10-CM | POA: Diagnosis not present

## 2017-01-06 ENCOUNTER — Other Ambulatory Visit: Payer: Self-pay | Admitting: Urology

## 2017-01-06 DIAGNOSIS — N281 Cyst of kidney, acquired: Secondary | ICD-10-CM

## 2017-01-13 ENCOUNTER — Ambulatory Visit (HOSPITAL_COMMUNITY)
Admission: RE | Admit: 2017-01-13 | Discharge: 2017-01-13 | Disposition: A | Discharge status: Home / Self Care | Payer: 59 | Source: Ambulatory Visit | Attending: Urology | Admitting: Urology

## 2017-01-13 DIAGNOSIS — N281 Cyst of kidney, acquired: Secondary | ICD-10-CM | POA: Insufficient documentation

## 2017-01-13 DIAGNOSIS — D3500 Benign neoplasm of unspecified adrenal gland: Secondary | ICD-10-CM | POA: Diagnosis not present

## 2017-01-13 DIAGNOSIS — D3501 Benign neoplasm of right adrenal gland: Secondary | ICD-10-CM | POA: Diagnosis not present

## 2017-01-13 MED ORDER — GADOBENATE DIMEGLUMINE 529 MG/ML IV SOLN
20.0000 mL | Freq: Once | INTRAVENOUS | Status: AC | PRN
  Administered 2017-01-13: 20 mL via INTRAVENOUS

## 2017-02-05 DIAGNOSIS — Z7984 Long term (current) use of oral hypoglycemic drugs: Secondary | ICD-10-CM | POA: Diagnosis not present

## 2017-02-05 DIAGNOSIS — Z125 Encounter for screening for malignant neoplasm of prostate: Secondary | ICD-10-CM | POA: Diagnosis not present

## 2017-02-05 DIAGNOSIS — E119 Type 2 diabetes mellitus without complications: Secondary | ICD-10-CM | POA: Diagnosis not present

## 2017-02-18 MED FILL — FREESTYLE LITE TEST STRIP: 90 days supply | Qty: 200 | Fill #0

## 2017-02-18 MED FILL — FREESTYLE LITE METER: 30 days supply | Qty: 1 | Fill #0

## 2017-02-18 MED FILL — FREESTYLE LANCETS: 90 days supply | Qty: 200 | Fill #0

## 2017-03-03 DIAGNOSIS — E7800 Pure hypercholesterolemia, unspecified: Secondary | ICD-10-CM | POA: Diagnosis not present

## 2017-03-03 DIAGNOSIS — I10 Essential (primary) hypertension: Secondary | ICD-10-CM | POA: Diagnosis not present

## 2017-03-03 DIAGNOSIS — Z9119 Patient's noncompliance with other medical treatment and regimen: Secondary | ICD-10-CM | POA: Diagnosis not present

## 2017-03-03 DIAGNOSIS — E1165 Type 2 diabetes mellitus with hyperglycemia: Secondary | ICD-10-CM | POA: Diagnosis not present

## 2017-03-04 MED FILL — UNIFINE PENTIPS 31GX3/16: 31G X 5 MM | 90 days supply | Qty: 100 | Fill #0

## 2017-03-04 MED FILL — UNIFINE PENTIPS 31GX3/16": 31G X 5 MM | 90 days supply | Qty: 100 | Fill #0

## 2017-03-04 MED FILL — LANTUS SOLOSTAR 100 UNITS/M: 100 | 28 days supply | Qty: 3 | Fill #0

## 2017-04-08 DIAGNOSIS — N281 Cyst of kidney, acquired: Secondary | ICD-10-CM | POA: Diagnosis not present

## 2017-04-08 DIAGNOSIS — R5383 Other fatigue: Secondary | ICD-10-CM | POA: Diagnosis not present

## 2017-04-22 DIAGNOSIS — E1165 Type 2 diabetes mellitus with hyperglycemia: Secondary | ICD-10-CM | POA: Diagnosis not present

## 2017-06-08 MED FILL — metFORMIN HCL 1000 MG TABS: 1000 | 90 days supply | Qty: 225 | Fill #0

## 2017-06-08 MED FILL — PANTOPRAZOLE SOD DR 40 MG T: 40 | 90 days supply | Qty: 180 | Fill #0

## 2017-06-14 MED FILL — GLIMEPIRIDE 4 MG TABLET: 4 | 30 days supply | Qty: 60 | Fill #0

## 2017-06-14 MED FILL — JANUVIA 100 MG TABLET: 100 | 30 days supply | Qty: 30 | Fill #0

## 2017-07-01 ENCOUNTER — Ambulatory Visit: Payer: Self-pay

## 2017-07-08 ENCOUNTER — Ambulatory Visit: Payer: Self-pay

## 2017-07-15 ENCOUNTER — Ambulatory Visit: Payer: Self-pay

## 2017-07-21 MED FILL — JANUVIA 100 MG TABLET: 100 | 30 days supply | Qty: 30 | Fill #1

## 2017-07-21 MED FILL — GLIMEPIRIDE 4 MG TABLET: 4 | 30 days supply | Qty: 60 | Fill #1

## 2017-07-22 ENCOUNTER — Ambulatory Visit: Payer: Self-pay

## 2017-07-29 ENCOUNTER — Ambulatory Visit: Payer: Self-pay

## 2017-07-29 ENCOUNTER — Encounter (HOSPITAL_COMMUNITY): Payer: Self-pay

## 2017-07-29 DIAGNOSIS — G479 Sleep disorder, unspecified: Secondary | ICD-10-CM | POA: Diagnosis not present

## 2017-07-29 DIAGNOSIS — Z7982 Long term (current) use of aspirin: Secondary | ICD-10-CM | POA: Insufficient documentation

## 2017-07-29 DIAGNOSIS — R5383 Other fatigue: Secondary | ICD-10-CM | POA: Diagnosis not present

## 2017-07-29 DIAGNOSIS — Z87891 Personal history of nicotine dependence: Secondary | ICD-10-CM | POA: Insufficient documentation

## 2017-07-29 DIAGNOSIS — Z79899 Other long term (current) drug therapy: Secondary | ICD-10-CM | POA: Diagnosis not present

## 2017-07-29 DIAGNOSIS — M6281 Muscle weakness (generalized): Secondary | ICD-10-CM | POA: Insufficient documentation

## 2017-07-29 DIAGNOSIS — I252 Old myocardial infarction: Secondary | ICD-10-CM | POA: Insufficient documentation

## 2017-07-29 DIAGNOSIS — R5382 Chronic fatigue, unspecified: Secondary | ICD-10-CM | POA: Diagnosis not present

## 2017-07-29 DIAGNOSIS — R634 Abnormal weight loss: Secondary | ICD-10-CM | POA: Diagnosis not present

## 2017-07-29 DIAGNOSIS — Z7984 Long term (current) use of oral hypoglycemic drugs: Secondary | ICD-10-CM | POA: Diagnosis not present

## 2017-07-29 DIAGNOSIS — E119 Type 2 diabetes mellitus without complications: Secondary | ICD-10-CM | POA: Insufficient documentation

## 2017-07-29 LAB — BASIC METABOLIC PANEL
Anion gap: 8 (ref 5–15)
BUN: 20 mg/dL (ref 6–20)
CALCIUM: 9.6 mg/dL (ref 8.9–10.3)
CO2: 26 mmol/L (ref 22–32)
CREATININE: 0.94 mg/dL (ref 0.61–1.24)
Chloride: 103 mmol/L (ref 101–111)
GFR calc Af Amer: >60 mL/min (ref >60)
Glucose, Bld: 179 mg/dL — ABNORMAL HIGH (ref 65–99)
POTASSIUM: 4 mmol/L (ref 3.5–5.1)
SODIUM: 137 mmol/L (ref 135–145)

## 2017-07-29 LAB — URINALYSIS, ROUTINE W REFLEX MICROSCOPIC
Bilirubin Urine: NEGATIVE
GLUCOSE, UA: NEGATIVE mg/dL
Hgb urine dipstick: NEGATIVE
Ketones, ur: 5 mg/dL — AB
LEUKOCYTES UA: NEGATIVE
NITRITE: NEGATIVE
PH: 5 (ref 5.0–8.0)
Protein, ur: 30 mg/dL — AB
Specific Gravity, Urine: 1.034 — ABNORMAL HIGH (ref 1.005–1.030)

## 2017-07-29 LAB — CBC
HCT: 42.4 % (ref 39.0–52.0)
Hemoglobin: 14.6 g/dL (ref 13.0–17.0)
MCH: 30 pg (ref 26.0–34.0)
MCHC: 34.4 g/dL (ref 30.0–36.0)
MCV: 87.2 fL (ref 78.0–100.0)
PLATELETS: 297 10*3/uL (ref 150–400)
RBC: 4.86 MIL/uL (ref 4.22–5.81)
RDW: 13.5 % (ref 11.5–15.5)
WBC: 10.1 10*3/uL (ref 4.0–10.5)

## 2017-07-29 NOTE — ED Triage Notes (Signed)
Pt was sent here from [facility] for failure to thrive and weakness that has been going on for months. Pt states that he has had a weight loss of 80 lbs in the past three months. Pt told to come here for a full work up

## 2017-07-30 ENCOUNTER — Emergency Department (HOSPITAL_COMMUNITY): Payer: 59

## 2017-07-30 ENCOUNTER — Emergency Department (HOSPITAL_COMMUNITY)
Admission: EM | Admit: 2017-07-30 | Discharge: 2017-07-30 | Disposition: A | Discharge status: Home / Self Care | Payer: 59 | Attending: Emergency Medicine | Admitting: Emergency Medicine

## 2017-07-30 ENCOUNTER — Other Ambulatory Visit: Payer: Self-pay

## 2017-07-30 DIAGNOSIS — R634 Abnormal weight loss: Secondary | ICD-10-CM | POA: Diagnosis not present

## 2017-07-30 DIAGNOSIS — Z7984 Long term (current) use of oral hypoglycemic drugs: Secondary | ICD-10-CM | POA: Diagnosis not present

## 2017-07-30 DIAGNOSIS — R531 Weakness: Secondary | ICD-10-CM | POA: Diagnosis not present

## 2017-07-30 DIAGNOSIS — E119 Type 2 diabetes mellitus without complications: Secondary | ICD-10-CM | POA: Diagnosis not present

## 2017-07-30 DIAGNOSIS — M6281 Muscle weakness (generalized): Secondary | ICD-10-CM | POA: Diagnosis not present

## 2017-07-30 DIAGNOSIS — I252 Old myocardial infarction: Secondary | ICD-10-CM | POA: Diagnosis not present

## 2017-07-30 DIAGNOSIS — Z87891 Personal history of nicotine dependence: Secondary | ICD-10-CM | POA: Diagnosis not present

## 2017-07-30 DIAGNOSIS — Z7982 Long term (current) use of aspirin: Secondary | ICD-10-CM | POA: Diagnosis not present

## 2017-07-30 DIAGNOSIS — R5383 Other fatigue: Secondary | ICD-10-CM | POA: Diagnosis not present

## 2017-07-30 DIAGNOSIS — Z79899 Other long term (current) drug therapy: Secondary | ICD-10-CM | POA: Diagnosis not present

## 2017-07-30 HISTORY — DX: Adult failure to thrive: R62.7

## 2017-07-30 LAB — HEPATIC FUNCTION PANEL
ALK PHOS: 56 U/L (ref 38–126)
ALT: 21 U/L (ref 17–63)
AST: 19 U/L (ref 15–41)
Albumin: 3.8 g/dL (ref 3.5–5.0)
Total Bilirubin: 0.4 mg/dL (ref 0.3–1.2)
Total Protein: 7 g/dL (ref 6.5–8.1)

## 2017-07-30 LAB — I-STAT TROPONIN, ED: Troponin i, poc: 0.01 ng/mL (ref 0.00–0.08)

## 2017-07-30 LAB — LIPASE, BLOOD: Lipase: 44 U/L (ref 11–51)

## 2017-07-30 MED ORDER — SODIUM CHLORIDE 0.9 % IV BOLUS (SEPSIS)
500.0000 mL | Freq: Once | INTRAVENOUS | Status: AC
  Administered 2017-07-30: 500 mL via INTRAVENOUS

## 2017-07-30 NOTE — ED Notes (Signed)
Patient transported to X-ray 

## 2017-07-30 NOTE — ED Provider Notes (Signed)
Medical screening examination/treatment/procedure(s) were conducted as a shared visit with non-physician practitioner(s) and myself.  I personally evaluated the patient during the encounter.   EKG Interpretation  Date/Time:  Thursday July 29 2017 21:53:32 EST Ventricular Rate:  94 PR Interval:  134 QRS Duration: 80 QT Interval:  336 QTC Calculation: 420 R Axis:   80 Text Interpretation:  Normal sinus rhythm with sinus arrhythmia Inferior infarct , age undetermined Abnormal ECG similar to prior.  Confirmed by Zenovia Jarred [phone]) on 07/29/2017 9:56:53 PM      Patient is a 66 year old male who presents to the emergency department from urgent care with concerns for weight loss and decreased appetite.  He has no complaints of pain other than chronic lower back pain.  Workup here has been unremarkable.  Normal labs including blood counts, electrolytes, kidney function, LFTs and lipase.  Negative troponin.  Urine shows no sign of infection and only 5 ketones.  He has received IV fluids and is eating and drinking in the room without difficulty.  Blood sugar normal.  Not orthostatic.  Chest x-ray clear.  Recommended close follow-up with his primary care doctor.  He is comfortable with this plan.  No sign of life-threatening illness or injury today.   Micheline Markes, Delice Bison, DO 07/30/17 [phone]

## 2017-07-30 NOTE — ED Provider Notes (Signed)
[facility] EMERGENCY DEPARTMENT Provider Note   CSN: [id number] Arrival date & time: 07/29/17  2137     History   Chief Complaint Chief Complaint  Patient presents with  . Failure To Thrive  . Weakness    HPI Justin Key is a 66 y.o. male.  Justin Key is a 66 y.o. Male who presents to the emergency department with his wife after being sent by urgent care complaining of generalized fatigue and weakness, weight loss, and lightheadedness.  Patient reports over the past 6 months he has had generalized fatigue and weakness with associated weight loss.  He tells me he believes he has lost 80 pounds in the past six months.  He reports this all began when he detoxed off oxycodone in July.  He reports he is off his narcotic pain medications now.  He took oxycodone for chronic low back pain.  He reports ongoing bilateral low back pain that is not worsened recently.  He tells me he has been feeling lightheaded with position change and feels unsteady on his feet.  He typically uses a cane with walking but lately has been using a walker.  He reports he has no appetite or energy.  He has not not been eating very much.  He denies fevers, chest pain, coughing, shortness of breath, abdominal pain, nausea, vomiting, diarrhea, urinary symptoms, rashes, fall, head injury, syncope, palpitations, numbness, or focal weakness.   The history is provided by the patient, medical records and the spouse. No language interpreter was used.  Weakness  Primary symptoms include no dizziness. Pertinent negatives include no shortness of breath, no chest pain, no vomiting and no headaches.    Past Medical History:  Diagnosis Date  . Back pain   . Diabetes mellitus without complication ([place])   . Failure to thrive in adult   . GERD (gastroesophageal reflux disease)   . Myocardial infarction ([place]) 1997    There are no active problems to display for this patient.   Past Surgical  History:  Procedure Laterality Date  . BACK SURGERY     microdiskectomy x2  . KNEE ARTHROSCOPY  1988   Left  . SPINAL CORD STIMULATOR IMPLANT    . SPINAL CORD STIMULATOR REMOVAL    . SPINAL FUSION     x 3       Home Medications    Prior to Admission medications   Medication Sig Start Date End Date Taking? Authorizing Provider  aspirin 325 MG tablet Take 325 mg by mouth every morning.    Yes [provider]  cyclobenzaprine (FLEXERIL) 10 MG tablet Take 10 mg as needed by mouth. 08/09/14  Yes [provider]  glimepiride (AMARYL) 4 MG tablet Take 4 mg by mouth daily with breakfast.    Yes [provider]  ibuprofen (ADVIL,MOTRIN) 800 MG tablet Take 600-800 mg as needed by mouth. 05/10/12  Yes [provider]  JANUVIA 100 MG tablet Take 100 mg daily by mouth. 07/21/17  Yes [provider]  lidocaine (LIDODERM) 5 % Place 1 patch every 12 (twelve) hours onto the skin. 02/13/16  Yes [provider]  Melatonin 5 MG TABS Take 5 mg as needed by mouth. 03/30/11  Yes [provider]  metFORMIN (GLUCOPHAGE) 500 MG tablet Take 1,000-1,500 mg by mouth 2 (two) times daily with a meal. Take 1500mg  in the morning and 1000mg  in the evening   Yes [provider]  pantoprazole (PROTONIX) 20 MG tablet  Take 40 mg by mouth 2 (two) times daily.    Yes [provider]    Family History No family history on file.  Social History Social History   Tobacco Use  . Smoking status: Former Smoker    Last attempt to quit: 04/09/2012    Years since quitting: 5.3  . Smokeless tobacco: Never Used  Substance Use Topics  . Alcohol use: Yes  . Drug use: No     Allergies   Humibid la [guaifenesin]   Review of Systems Review of Systems  Constitutional: Positive for appetite change and fatigue. Negative for chills and fever.  HENT: Negative for congestion and sore throat.   Eyes: Negative for visual disturbance.  Respiratory:  Negative for cough, shortness of breath and wheezing.   Cardiovascular: Negative for chest pain and palpitations.  Gastrointestinal: Negative for abdominal pain, diarrhea, nausea and vomiting.  Genitourinary: Negative for difficulty urinating, dysuria and frequency.  Musculoskeletal: Negative for back pain and neck pain.  Skin: Negative for rash.  Neurological: Positive for light-headedness. Negative for dizziness, syncope, numbness and headaches.     Physical Exam Updated Vital Signs BP (!) 157/99   Pulse 71   Temp 97.7 F (36.5 C)   Resp 19   Ht 5\' 10"  (1.778 m)   Wt 99.8 kg (220 lb)   SpO2 100%   BMI 31.57 kg/m   Physical Exam  Constitutional: He is oriented to person, place, and time. He appears well-developed and well-nourished. No distress.  Nontoxic-appearing.  HENT:  Head: Normocephalic and atraumatic.  Right Ear: External ear normal.  Left Ear: External ear normal.  Mouth/Throat: Oropharynx is clear and moist.  Eyes: Conjunctivae are normal. Pupils are equal, round, and reactive to light. Right eye exhibits no discharge. Left eye exhibits no discharge. No scleral icterus.  Congenital right eye palsy. EOMs and vision otherwise intact.   Neck: Normal range of motion. Neck supple.  Cardiovascular: Normal rate, regular rhythm, normal heart sounds and intact distal pulses. Exam reveals no gallop and no friction rub.  No murmur heard. Pulmonary/Chest: Effort normal and breath sounds normal. No respiratory distress. He has no wheezes. He has no rales.  Lungs are clear to ascultation bilaterally. Symmetric chest expansion bilaterally. No increased work of breathing. No rales or rhonchi.    Abdominal: Soft. Bowel sounds are normal. He exhibits no distension and no mass. There is no tenderness. There is no guarding.  Musculoskeletal: Normal range of motion. He exhibits no edema, tenderness or deformity.  No midline neck or back tenderness to palpation.  Old scar noted to his  lumbar spine.  No overlying skin changes.  No erythema or edema.  He has good strength to his bilateral upper and lower extremities.  Good strength plantar dorsiflexion bilaterally.  No lower extremity edema or tenderness.  Lymphadenopathy:    He has no cervical adenopathy.  Neurological: He is alert and oriented to person, place, and time. No cranial nerve deficit or sensory deficit. He exhibits normal muscle tone. Coordination normal.  Patient is alert and oriented 3. Speech is clear and coherent. Cranial nerves are intact. Sensation and strength is intact to bilateral upper and lower extremities. No pronator drift.  Finger to nose intact.  Vision is grossly intact.  Skin: Skin is warm and dry. Capillary refill takes less than 2 seconds. No rash noted. He is not diaphoretic. No erythema. No pallor.  Psychiatric: He has a normal mood and affect. His behavior is normal.  Nursing  note and vitals reviewed.    ED Treatments / Results  Labs (all labs ordered are listed, but only abnormal results are displayed) Labs Reviewed  BASIC METABOLIC PANEL - Abnormal; Notable for the following components:      Result Value   Glucose, Bld 179 (*)    All other components within normal limits  URINALYSIS, ROUTINE W REFLEX MICROSCOPIC - Abnormal; Notable for the following components:   Color, Urine AMBER (*)    APPearance HAZY (*)    Specific Gravity, Urine 1.034 (*)    Ketones, ur 5 (*)    Protein, ur 30 (*)    Bacteria, UA RARE (*)    Squamous Epithelial / LPF 6-30 (*)    All other components within normal limits  HEPATIC FUNCTION PANEL - Abnormal; Notable for the following components:   Bilirubin, Direct <0.1 (*)    All other components within normal limits  CBC  LIPASE, BLOOD  CBG MONITORING, ED  I-STAT TROPONIN, ED    EKG  EKG Interpretation  Date/Time:  Thursday July 29 2017 21:53:32 EST Ventricular Rate:  94 PR Interval:  134 QRS Duration: 80 QT Interval:  336 QTC  Calculation: 420 R Axis:   80 Text Interpretation:  Normal sinus rhythm with sinus arrhythmia Inferior infarct , age undetermined Abnormal ECG similar to prior.  Confirmed by Zenovia Jarred [phone]) on 07/29/2017 9:56:53 PM       Radiology Dg Chest 2 View  Result Date: 07/30/2017 CLINICAL DATA:  Weakness and fatigue EXAM: CHEST  2 VIEW COMPARISON:  01/10/2016 FINDINGS: The heart size and mediastinal contours are within normal limits. Both lungs are clear. The visualized skeletal structures are unremarkable. IMPRESSION: No active cardiopulmonary disease. Electronically Signed   By: Donavan Foil M.D.   On: 07/30/2017 02:26   Dg Lumbar Spine Complete  Result Date: 07/30/2017 CLINICAL DATA:  Generalize weakness and fatigue EXAM: LUMBAR SPINE - COMPLETE 4+ VIEW COMPARISON:  09/28/2013 FINDINGS: Posterior stabilization rods and fixating screws L1 through L5 with interbody devices at L1-L2, L2-L3, L3-L4 and L4-L5. Mild degenerative changes at L5-S1. No acute fracture. Aortic atherosclerosis. IMPRESSION: No acute osseous abnormality. Postsurgical changes from L1 through L5. Electronically Signed   By: Donavan Foil M.D.   On: 07/30/2017 02:29    Procedures Procedures (including critical care time)  Medications Ordered in ED Medications  sodium chloride 0.9 % bolus 500 mL (0 mLs Intravenous Stopped 07/30/17 0320)     Initial Impression / Assessment and Plan / ED Course  I have reviewed the triage vital signs and the nursing notes.  Pertinent labs & imaging results that were available during my care of the patient were reviewed by me and considered in my medical decision making (see chart for details).    This is a 66 y.o. Male who presents to the emergency department with his wife after being sent by urgent care complaining of generalized fatigue and weakness, weight loss, and lightheadedness.  Patient reports over the past 6 months he has had generalized fatigue and weakness with associated  weight loss.  He tells me he believes he has lost 80 pounds in the past six months.  He reports this all began when he detoxed off oxycodone in July.  He reports he is off his narcotic pain medications now.  He took oxycodone for chronic low back pain.  He reports ongoing bilateral low back pain that is not worsened recently.  He tells me he has been feeling lightheaded with position  change and feels unsteady on his feet.  He typically uses a cane with walking but lately has been using a walker.  He reports he has no appetite or energy.  He has not not been eating very much.  The patient is afebrile nontoxic-appearing.  He has no focal neurological deficits.  He ambulates with his cane with out difficulty.  Abdomen is soft and nontender.  Lungs clear to auscultation bilaterally.  No overlying skin changes to his back.  No midline back tenderness. EKG is without evidence of STEMI.  Troponin is not elevated.  Urinalysis without sign of infection.  BMP is remarkable only for glucose of 179 with normal anion gap.  CBC is unremarkable.  Hepatic function panel is unremarkable.  Normal liver enzymes.  Chest x-ray shows no acute findings.  Lumbar spine x-ray shows no acute findings. While awaiting test results patient request something to eat.  He eats a sandwich and apple sauce while in the emergency department.  Workup here is reassuring.  I do advised that he needs continued further workup by primary care.  We do only an emergency workup and I see no signs of emergent life-threatening illness.  He needs continued workup by primary care.  He agrees with plan for follow-up.  Return precautions discussed. I advised the patient to follow-up with their primary care provider this week. I advised the patient to return to the emergency department with new or worsening symptoms or new concerns. The patient verbalized understanding and agreement with plan.   This patient was discussed with and evaluated by Dr. Leonides Schanz who agrees  with assessment and plan.  Final Clinical Impressions(s) / ED Diagnoses   Final diagnoses:  Fatigue, unspecified type  Generalized weakness  Unexplained weight loss    ED Discharge Orders    None       Waynetta Pean, PA-C 07/30/17 [id number]

## 2017-07-30 NOTE — ED Notes (Signed)
PO challenge started

## 2017-08-03 ENCOUNTER — Other Ambulatory Visit (HOSPITAL_COMMUNITY): Payer: Self-pay | Admitting: Family Medicine

## 2017-08-03 DIAGNOSIS — R634 Abnormal weight loss: Secondary | ICD-10-CM | POA: Diagnosis not present

## 2017-08-03 DIAGNOSIS — E119 Type 2 diabetes mellitus without complications: Secondary | ICD-10-CM | POA: Diagnosis not present

## 2017-08-03 DIAGNOSIS — R531 Weakness: Secondary | ICD-10-CM | POA: Diagnosis not present

## 2017-08-03 DIAGNOSIS — R112 Nausea with vomiting, unspecified: Secondary | ICD-10-CM | POA: Diagnosis not present

## 2017-08-03 DIAGNOSIS — R630 Anorexia: Secondary | ICD-10-CM | POA: Diagnosis not present

## 2017-08-04 ENCOUNTER — Other Ambulatory Visit (HOSPITAL_COMMUNITY): Payer: Self-pay | Admitting: Family Medicine

## 2017-08-04 DIAGNOSIS — R531 Weakness: Secondary | ICD-10-CM

## 2017-08-04 DIAGNOSIS — R634 Abnormal weight loss: Secondary | ICD-10-CM

## 2017-08-05 ENCOUNTER — Ambulatory Visit: Payer: Self-pay

## 2017-08-18 MED FILL — traZODone HCL 50 MG TABS: 50 | 15 days supply | Qty: 30 | Fill #0

## 2017-08-20 ENCOUNTER — Ambulatory Visit (HOSPITAL_COMMUNITY)
Admission: RE | Admit: 2017-08-20 | Discharge: 2017-08-20 | Disposition: A | Discharge status: Home / Self Care | Payer: 59 | Source: Ambulatory Visit | Attending: Family Medicine | Admitting: Family Medicine

## 2017-08-20 DIAGNOSIS — I700 Atherosclerosis of aorta: Secondary | ICD-10-CM | POA: Diagnosis not present

## 2017-08-20 DIAGNOSIS — R531 Weakness: Secondary | ICD-10-CM | POA: Diagnosis not present

## 2017-08-20 DIAGNOSIS — R634 Abnormal weight loss: Secondary | ICD-10-CM | POA: Diagnosis not present

## 2017-08-20 DIAGNOSIS — R109 Unspecified abdominal pain: Secondary | ICD-10-CM | POA: Diagnosis not present

## 2017-08-20 DIAGNOSIS — I2510 Atherosclerotic heart disease of native coronary artery without angina pectoris: Secondary | ICD-10-CM | POA: Diagnosis not present

## 2017-08-20 DIAGNOSIS — I639 Cerebral infarction, unspecified: Secondary | ICD-10-CM | POA: Diagnosis not present

## 2017-08-20 DIAGNOSIS — R918 Other nonspecific abnormal finding of lung field: Secondary | ICD-10-CM | POA: Insufficient documentation

## 2017-08-20 MED ORDER — IOPAMIDOL (ISOVUE-300) INJECTION 61%
INTRAVENOUS | Status: AC
  Administered 2017-08-20: 100 mL
  Filled 2017-08-20: qty 100

## 2017-08-24 MED FILL — JANUVIA 100 MG TABLET: 100 | 30 days supply | Qty: 30 | Fill #2

## 2017-08-24 MED FILL — GLIMEPIRIDE 4 MG TABLET: 4 | 30 days supply | Qty: 60 | Fill #2

## 2017-08-26 DIAGNOSIS — I10 Essential (primary) hypertension: Secondary | ICD-10-CM | POA: Diagnosis not present

## 2017-08-26 DIAGNOSIS — E1165 Type 2 diabetes mellitus with hyperglycemia: Secondary | ICD-10-CM | POA: Diagnosis not present

## 2017-08-26 DIAGNOSIS — Z9119 Patient's noncompliance with other medical treatment and regimen: Secondary | ICD-10-CM | POA: Diagnosis not present

## 2017-08-26 DIAGNOSIS — E7800 Pure hypercholesterolemia, unspecified: Secondary | ICD-10-CM | POA: Diagnosis not present

## 2017-08-27 DIAGNOSIS — R630 Anorexia: Secondary | ICD-10-CM | POA: Diagnosis not present

## 2017-08-27 DIAGNOSIS — R1310 Dysphagia, unspecified: Secondary | ICD-10-CM | POA: Diagnosis not present

## 2017-08-27 DIAGNOSIS — F5101 Primary insomnia: Secondary | ICD-10-CM | POA: Diagnosis not present

## 2017-08-27 DIAGNOSIS — R9389 Abnormal findings on diagnostic imaging of other specified body structures: Secondary | ICD-10-CM | POA: Diagnosis not present

## 2017-09-06 ENCOUNTER — Other Ambulatory Visit: Payer: Self-pay | Admitting: Gastroenterology

## 2017-09-06 DIAGNOSIS — R634 Abnormal weight loss: Secondary | ICD-10-CM | POA: Diagnosis not present

## 2017-09-06 DIAGNOSIS — R1310 Dysphagia, unspecified: Secondary | ICD-10-CM | POA: Diagnosis not present

## 2017-09-08 ENCOUNTER — Ambulatory Visit
Admission: RE | Admit: 2017-09-08 | Discharge: 2017-09-08 | Disposition: A | Discharge status: Home / Self Care | Payer: 59 | Source: Ambulatory Visit | Attending: Gastroenterology | Admitting: Gastroenterology

## 2017-09-08 DIAGNOSIS — R1310 Dysphagia, unspecified: Secondary | ICD-10-CM

## 2017-09-15 MED FILL — FREESTYLE LITE TEST STRIP: 90 days supply | Qty: 200 | Fill #1

## 2017-09-24 MED FILL — traZODone HCL 50 MG TABS: 50 | 30 days supply | Qty: 60 | Fill #0

## 2017-09-28 MED FILL — JANUVIA 100 MG TABLET: 100 | 30 days supply | Qty: 30 | Fill #3

## 2017-09-28 MED FILL — GLIMEPIRIDE 4 MG TABLET: 4 | 30 days supply | Qty: 60 | Fill #3

## 2017-10-04 MED FILL — MIRTAZAPINE 15 MG TAB: 15 | 15 days supply | Qty: 30 | Fill #0

## 2017-10-11 DIAGNOSIS — R634 Abnormal weight loss: Secondary | ICD-10-CM | POA: Diagnosis not present

## 2017-10-11 DIAGNOSIS — F5101 Primary insomnia: Secondary | ICD-10-CM | POA: Diagnosis not present

## 2017-10-12 ENCOUNTER — Ambulatory Visit (INDEPENDENT_AMBULATORY_CARE_PROVIDER_SITE_OTHER): Payer: 59 | Admitting: Internal Medicine

## 2017-10-12 ENCOUNTER — Other Ambulatory Visit (INDEPENDENT_AMBULATORY_CARE_PROVIDER_SITE_OTHER): Payer: 59

## 2017-10-12 ENCOUNTER — Encounter: Payer: Self-pay | Admitting: Internal Medicine

## 2017-10-12 VITALS — BP 128/82 | HR 84 | Ht 74.0 in | Wt 162.8 lb

## 2017-10-12 DIAGNOSIS — R918 Other nonspecific abnormal finding of lung field: Secondary | ICD-10-CM | POA: Insufficient documentation

## 2017-10-12 LAB — SEDIMENTATION RATE: SED RATE: 10 mm/h (ref 0–20)

## 2017-10-12 NOTE — Patient Instructions (Addendum)
Please remember to go to the lab department downstairs in the basement  for your tests - we will call you with the results when they are available.  We will call you in 6 months to be sure the follow up CT is done - if you have any new respiratory symptoms in meantime please call for earlier follow up

## 2017-10-12 NOTE — Progress Notes (Signed)
Subjective:     Patient ID: Justin Key, male   DOB: [birth year],    MRN: [id number]  HPI  37 yobm retired nurse  quit smoking 2013  With  W/u for anoroexia/ wt loss > GG density RLL on CT chest 08/20/17 so referred to pulmonary clinic 10/12/2017 by Dr   Kathyrn Lass      10/12/2017 1st [facility] office visit/ Justin Key   Chief Complaint  Patient presents with  . New Consult    CT scan showed nodule RLL, weight loss  started w/d from narcotics for chronic back pain  early June 2018 weaned off mid July  2018 completely off all rx and rough time since with fatigue/ wt loss/ back pain but no resp symptoms   Not limited by breathing from desired activities  But by fatigue / back pain   No obvious day to day or daytime variability or assoc excess/ purulent sputum or mucus plugs or hemoptysis or cp or chest tightness, subjective wheeze or overt sinus or hb symptoms. No unusual exposure hx or h/o childhood pna/ asthma or knowledge of premature birth.  Sleeping ok flat without nocturnal  or early am exacerbation  of respiratory  c/o's or need for noct saba. Also denies any obvious fluctuation of symptoms with weather or environmental changes or other aggravating or alleviating factors except as outlined above   Current Allergies, Complete Past Medical History, Past Surgical History, Family History, and Social History were reviewed in [employer] record.  ROS  The following are not active complaints unless bolded Hoarseness, sore throat, dysphagia, dental problems, itching, sneezing,  nasal congestion or discharge of excess mucus or purulent secretions, ear ache,   fever, chills, sweats, unintended wt loss or wt gain, classically pleuritic or exertional cp,  orthopnea pnd or leg swelling, presyncope, palpitations, abdominal pain, anorexia, nausea, vomiting, diarrhea  or change in bowel habits or change in bladder habits, change in stools or change in urine, dysuria,  hematuria,  rash, arthralgias, visual complaints, headache, numbness, weakness or ataxia or problems with walking or coordination,  change in mood/affect or memory.        Current Meds  Medication Sig  . aspirin 325 MG tablet Take 325 mg by mouth every morning.   . cyclobenzaprine (FLEXERIL) 10 MG tablet Take 10 mg as needed by mouth.  Marland Kitchen glimepiride (AMARYL) 4 MG tablet Take 4 mg by mouth daily with breakfast.   . ibuprofen (ADVIL,MOTRIN) 800 MG tablet Take 600-800 mg as needed by mouth.  Marland Kitchen JANUVIA 100 MG tablet Take 100 mg daily by mouth.  . lidocaine (LIDODERM) 5 % Place 1 patch every 12 (twelve) hours onto the skin.  . Melatonin 5 MG TABS Take 5 mg as needed by mouth.  . metFORMIN (GLUCOPHAGE) 500 MG tablet Take 1,000-1,500 mg by mouth 2 (two) times daily with a meal. Take 1500mg  in the morning and 1000mg  in the evening  . mirtazapine (REMERON) 15 MG tablet Take 15 mg by mouth at bedtime as needed and may repeat dose one time if needed.  . pantoprazole (PROTONIX) 20 MG tablet Take 40 mg by mouth 2 (two) times daily.         Review of Systems     Objective:   Physical Exam     Wt Readings from Last 3 Encounters:  10/12/17 162 lb 12.8 oz (73.8 kg)  07/30/17 220 lb (99.8 kg)  07/12/12 220 lb (99.8 kg)     Vital  signs reviewed - Note on arrival 02 sats  99% on RA   HEENT: nl dentition, turbinates bilaterally, and oropharynx. Nl external ear canals without cough reflex   NECK :  without JVD/Nodes/TM/ nl carotid upstrokes bilaterally   LUNGS: no acc muscle use,  Nl contour chest which is clear to A and P bilaterally without cough on insp or exp maneuvers   CV:  RRR  no s3 or murmur or increase in P2, and no edema   ABD:  soft and nontender with nl inspiratory excursion in the supine position. No bruits or organomegaly appreciated, bowel sounds nl  MS:  Nl gait/ ext warm without deformities, calf tenderness, cyanosis or clubbing No obvious joint restrictions   SKIN:  warm and dry without lesions    NEURO:  alert, approp, nl sensorium with  no motor or cerebellar deficits apparent.    Labs ordered 10/12/2017   Quantiferon GOLD  TB       Lab Results  Component Value Date   ESRSEDRATE 10 10/12/2017       Assessment:

## 2017-10-13 ENCOUNTER — Encounter: Payer: Self-pay | Admitting: Internal Medicine

## 2017-10-13 NOTE — Assessment & Plan Note (Signed)
CT chest  08/20/17  RLL gg  18 mm> in reminder file for 03/20/18  - 10/12/2017  ESR = 10  - 10/12/2017   Quant TB pending   Most likely this is inflammatory ? Related to aspiration ? 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 >>> Too small for PET(not good for small GG changes)  or bx, not suspicious enough for excisional bx > really only option for now is follow the Fleischner society guidelines as rec by radiology =  6 m follow up approp and placed in computer for recall  I really doubt this tiny area has anything clinical impact including the wt loss or anorexia (even if it does turn out to be early stage lung ca) but I have asked the pt to call me in the meantime if he does develop any pulmonary symtpoms and we'll address them then / re-evaluate timing for f/u studies   Total time devoted to counseling  > 50 % of initial 60 min office visit:  review case with pt/ discussion of options/alternatives/ personally creating written customized instructions  in presence of pt  then going over those specific  Instructions directly with the pt including how to use all of the meds but in particular covering each new medication in detail and the difference between the maintenance= "automatic" meds and the prns using an action plan format for the latter (If this problem/symptom => do that organization reading Left to right).  Please see AVS from this visit for a full list of these instructions which I personally wrote for this pt and  are unique to this visit.

## 2017-10-14 LAB — QUANTIFERON-TB GOLD PLUS
Mitogen-NIL: >10 IU/mL
NIL: 0.04 IU/mL
QUANTIFERON-TB GOLD PLUS: NEGATIVE
TB1-NIL: 0.01 IU/mL

## 2017-10-14 NOTE — Progress Notes (Signed)
Spoke with pt and notified of results per Dr. Wert. Pt verbalized understanding and denied any questions. 

## 2017-10-25 MED FILL — GLIMEPIRIDE 4 MG TABLET: 4 | 90 days supply | Qty: 180 | Fill #0

## 2017-10-25 MED FILL — JANUVIA 100 MG TABLET: 100 | 30 days supply | Qty: 30 | Fill #0

## 2017-11-08 ENCOUNTER — Other Ambulatory Visit: Payer: Self-pay | Admitting: Gastroenterology

## 2017-11-09 DIAGNOSIS — R634 Abnormal weight loss: Secondary | ICD-10-CM | POA: Diagnosis not present

## 2017-11-09 DIAGNOSIS — F5101 Primary insomnia: Secondary | ICD-10-CM | POA: Diagnosis not present

## 2017-11-11 MED FILL — metFORMIN HCL 1000 MG TABS: 1000 | 90 days supply | Qty: 225 | Fill #0

## 2017-11-15 ENCOUNTER — Ambulatory Visit (HOSPITAL_COMMUNITY)
Admission: RE | Admit: 2017-11-15 | Discharge: 2017-11-15 | Disposition: A | Discharge status: Home / Self Care | Payer: 59 | Source: Ambulatory Visit | Attending: Gastroenterology | Admitting: Gastroenterology

## 2017-11-15 ENCOUNTER — Encounter (HOSPITAL_COMMUNITY)
Admission: RE | Disposition: A | Discharge status: Home / Self Care | Payer: Self-pay | Source: Ambulatory Visit | Attending: Gastroenterology

## 2017-11-15 DIAGNOSIS — R1310 Dysphagia, unspecified: Secondary | ICD-10-CM | POA: Diagnosis not present

## 2017-11-15 HISTORY — PX: ESOPHAGEAL MANOMETRY: SHX5429

## 2017-11-15 SURGERY — MANOMETRY, ESOPHAGUS

## 2017-11-15 MED ORDER — LIDOCAINE VISCOUS 2 % MT SOLN
OROMUCOSAL | Status: AC
  Filled 2017-11-15: qty 15

## 2017-11-15 SURGICAL SUPPLY — 2 items
FACESHIELD LNG OPTICON STERILE (SAFETY) IMPLANT
GLOVE BIO SURGEON STRL SZ8 (GLOVE) ×6 IMPLANT

## 2017-11-15 NOTE — Progress Notes (Signed)
Esophageal manometry probe placed per protocol.  Patient performed well without complications.  Test results to be reviewed by Dr. Michail Sermon.

## 2017-11-16 DIAGNOSIS — R1310 Dysphagia, unspecified: Secondary | ICD-10-CM | POA: Diagnosis not present

## 2017-11-17 ENCOUNTER — Encounter (HOSPITAL_COMMUNITY): Payer: Self-pay | Admitting: Gastroenterology

## 2017-12-01 DIAGNOSIS — M21962 Unspecified acquired deformity of left lower leg: Secondary | ICD-10-CM | POA: Diagnosis not present

## 2017-12-01 DIAGNOSIS — B351 Tinea unguium: Secondary | ICD-10-CM | POA: Diagnosis not present

## 2017-12-01 DIAGNOSIS — E1351 Other specified diabetes mellitus with diabetic peripheral angiopathy without gangrene: Secondary | ICD-10-CM | POA: Diagnosis not present

## 2017-12-01 DIAGNOSIS — M21961 Unspecified acquired deformity of right lower leg: Secondary | ICD-10-CM | POA: Diagnosis not present

## 2017-12-01 DIAGNOSIS — M79671 Pain in right foot: Secondary | ICD-10-CM | POA: Diagnosis not present

## 2017-12-22 DIAGNOSIS — B351 Tinea unguium: Secondary | ICD-10-CM | POA: Diagnosis not present

## 2017-12-22 DIAGNOSIS — E1151 Type 2 diabetes mellitus with diabetic peripheral angiopathy without gangrene: Secondary | ICD-10-CM | POA: Diagnosis not present

## 2017-12-22 MED FILL — MIRTAZAPINE 15 MG TAB: 15 | 15 days supply | Qty: 30 | Fill #0

## 2018-01-11 MED FILL — GLIMEPIRIDE 4 MG TABLET: 4 | 90 days supply | Qty: 180 | Fill #1

## 2018-01-18 DIAGNOSIS — R102 Pelvic and perineal pain: Secondary | ICD-10-CM | POA: Diagnosis not present

## 2018-01-18 MED FILL — TAMSULOSIN HCL 0.4 MG CAP: 0.4 | 30 days supply | Qty: 30 | Fill #0

## 2018-01-21 ENCOUNTER — Other Ambulatory Visit (HOSPITAL_COMMUNITY): Payer: Self-pay | Admitting: Gastroenterology

## 2018-01-21 DIAGNOSIS — R1310 Dysphagia, unspecified: Secondary | ICD-10-CM

## 2018-01-24 MED FILL — MIRTAZAPINE 15 MG TAB: 15 | 15 days supply | Qty: 30 | Fill #0

## 2018-01-26 ENCOUNTER — Ambulatory Visit (HOSPITAL_COMMUNITY)
Admission: RE | Admit: 2018-01-26 | Discharge: 2018-01-26 | Disposition: A | Discharge status: Home / Self Care | Payer: 59 | Source: Ambulatory Visit | Attending: Gastroenterology | Admitting: Gastroenterology

## 2018-01-26 ENCOUNTER — Ambulatory Visit (HOSPITAL_COMMUNITY): Payer: 59

## 2018-01-26 ENCOUNTER — Encounter (HOSPITAL_COMMUNITY): Payer: Self-pay

## 2018-02-07 DIAGNOSIS — R634 Abnormal weight loss: Secondary | ICD-10-CM | POA: Diagnosis not present

## 2018-02-07 DIAGNOSIS — Z6820 Body mass index (BMI) 20.0-20.9, adult: Secondary | ICD-10-CM | POA: Diagnosis not present

## 2018-02-07 DIAGNOSIS — F5101 Primary insomnia: Secondary | ICD-10-CM | POA: Diagnosis not present

## 2018-02-07 DIAGNOSIS — E1169 Type 2 diabetes mellitus with other specified complication: Secondary | ICD-10-CM | POA: Diagnosis not present

## 2018-02-07 MED FILL — ZOLPIDEM TARTRATE 10 MG TAB: 10 | 10 days supply | Qty: 10 | Fill #0

## 2018-02-17 ENCOUNTER — Other Ambulatory Visit: Payer: Self-pay | Admitting: Internal Medicine

## 2018-02-17 DIAGNOSIS — R918 Other nonspecific abnormal finding of lung field: Secondary | ICD-10-CM

## 2018-02-17 DIAGNOSIS — R911 Solitary pulmonary nodule: Secondary | ICD-10-CM

## 2018-02-24 DIAGNOSIS — E119 Type 2 diabetes mellitus without complications: Secondary | ICD-10-CM | POA: Diagnosis not present

## 2018-02-28 DIAGNOSIS — M6281 Muscle weakness (generalized): Secondary | ICD-10-CM | POA: Diagnosis not present

## 2018-02-28 DIAGNOSIS — M62838 Other muscle spasm: Secondary | ICD-10-CM | POA: Diagnosis not present

## 2018-02-28 DIAGNOSIS — R102 Pelvic and perineal pain: Secondary | ICD-10-CM | POA: Diagnosis not present

## 2018-02-28 DIAGNOSIS — N3941 Urge incontinence: Secondary | ICD-10-CM | POA: Diagnosis not present

## 2018-02-28 DIAGNOSIS — R3915 Urgency of urination: Secondary | ICD-10-CM | POA: Diagnosis not present

## 2018-02-28 MED FILL — metFORMIN HCL 1000 MG TABS: 1000 | 90 days supply | Qty: 225 | Fill #0

## 2018-02-28 MED FILL — TAMSULOSIN HCL 0.4 MG CAP: 0.4 | 30 days supply | Qty: 30 | Fill #1

## 2018-03-07 DIAGNOSIS — R102 Pelvic and perineal pain: Secondary | ICD-10-CM | POA: Diagnosis not present

## 2018-03-07 DIAGNOSIS — N3941 Urge incontinence: Secondary | ICD-10-CM | POA: Diagnosis not present

## 2018-03-07 DIAGNOSIS — R3915 Urgency of urination: Secondary | ICD-10-CM | POA: Diagnosis not present

## 2018-03-15 MED FILL — PANTOPRAZOLE SOD DR 40 MG T: 40 | 90 days supply | Qty: 180 | Fill #0

## 2018-03-15 MED FILL — JANUVIA 100 MG TABLET: 100 | 30 days supply | Qty: 30 | Fill #1

## 2018-03-23 MED FILL — FREESTYLE LITE TEST STRIP: 90 days supply | Qty: 200 | Fill #0

## 2018-03-25 MED FILL — GLIMEPIRIDE 4 MG TABLET: 4 | 90 days supply | Qty: 180 | Fill #2

## 2018-03-28 DIAGNOSIS — Z86010 Personal history of colonic polyps: Secondary | ICD-10-CM | POA: Diagnosis not present

## 2018-03-28 DIAGNOSIS — K5904 Chronic idiopathic constipation: Secondary | ICD-10-CM | POA: Diagnosis not present

## 2018-03-28 DIAGNOSIS — K219 Gastro-esophageal reflux disease without esophagitis: Secondary | ICD-10-CM | POA: Diagnosis not present

## 2018-03-28 MED FILL — diazePAM 5 MG TABS: 5 | 1 days supply | Qty: 2 | Fill #0

## 2018-04-06 ENCOUNTER — Other Ambulatory Visit: Payer: Self-pay

## 2018-04-06 ENCOUNTER — Ambulatory Visit (INDEPENDENT_AMBULATORY_CARE_PROVIDER_SITE_OTHER)
Admission: RE | Admit: 2018-04-06 | Discharge: 2018-04-06 | Disposition: A | Discharge status: Home / Self Care | Payer: 59 | Source: Ambulatory Visit | Attending: Internal Medicine | Admitting: Internal Medicine

## 2018-04-06 DIAGNOSIS — R918 Other nonspecific abnormal finding of lung field: Secondary | ICD-10-CM | POA: Diagnosis not present

## 2018-04-06 DIAGNOSIS — R911 Solitary pulmonary nodule: Secondary | ICD-10-CM | POA: Diagnosis not present

## 2018-04-07 DIAGNOSIS — N3941 Urge incontinence: Secondary | ICD-10-CM | POA: Diagnosis not present

## 2018-04-07 DIAGNOSIS — R351 Nocturia: Secondary | ICD-10-CM | POA: Diagnosis not present

## 2018-04-07 DIAGNOSIS — R3915 Urgency of urination: Secondary | ICD-10-CM | POA: Diagnosis not present

## 2018-04-07 NOTE — Progress Notes (Signed)
Spoke with pt and notified of results per Dr. Wert. Pt verbalized understanding and denied any questions. 

## 2018-04-11 DIAGNOSIS — N401 Enlarged prostate with lower urinary tract symptoms: Secondary | ICD-10-CM | POA: Diagnosis not present

## 2018-04-11 DIAGNOSIS — R102 Pelvic and perineal pain: Secondary | ICD-10-CM | POA: Diagnosis not present

## 2018-04-11 DIAGNOSIS — N3941 Urge incontinence: Secondary | ICD-10-CM | POA: Diagnosis not present

## 2018-04-11 DIAGNOSIS — R351 Nocturia: Secondary | ICD-10-CM | POA: Diagnosis not present

## 2018-04-12 MED FILL — JANUVIA 100 MG TABLET: 100 | 30 days supply | Qty: 30 | Fill #2

## 2018-05-06 MED FILL — diazePAM 5 MG TABS: 5 | 1 days supply | Qty: 3 | Fill #0

## 2018-05-10 ENCOUNTER — Other Ambulatory Visit: Payer: Self-pay | Admitting: Gastroenterology

## 2018-05-12 MED FILL — JANUVIA 100 MG TABLET: 100 | 30 days supply | Qty: 30 | Fill #3

## 2018-05-30 MED FILL — metFORMIN HCL 1000 MG TABS: 1000 | 90 days supply | Qty: 225 | Fill #0

## 2018-06-06 DIAGNOSIS — Z6820 Body mass index (BMI) 20.0-20.9, adult: Secondary | ICD-10-CM | POA: Diagnosis not present

## 2018-06-06 DIAGNOSIS — F5101 Primary insomnia: Secondary | ICD-10-CM | POA: Diagnosis not present

## 2018-06-06 DIAGNOSIS — R112 Nausea with vomiting, unspecified: Secondary | ICD-10-CM | POA: Diagnosis not present

## 2018-06-06 DIAGNOSIS — L309 Dermatitis, unspecified: Secondary | ICD-10-CM | POA: Diagnosis not present

## 2018-06-06 MED FILL — traZODone HCL 50 MG TABS: 50 | 30 days supply | Qty: 60 | Fill #0

## 2018-06-06 MED FILL — JANUVIA 100 MG TABLET: 100 | 30 days supply | Qty: 30 | Fill #0

## 2018-06-07 MED FILL — GLIMEPIRIDE 4 MG TABLET: 4 | 90 days supply | Qty: 180 | Fill #3

## 2018-06-17 ENCOUNTER — Other Ambulatory Visit: Payer: Self-pay | Admitting: Gastroenterology

## 2018-06-17 MED FILL — PEG-3350 SOLUTION: 420 | 1 days supply | Qty: 4000 | Fill #0

## 2018-06-20 ENCOUNTER — Other Ambulatory Visit: Payer: Self-pay

## 2018-06-20 ENCOUNTER — Encounter (HOSPITAL_COMMUNITY): Payer: Self-pay | Admitting: *Deleted

## 2018-06-21 ENCOUNTER — Encounter (HOSPITAL_COMMUNITY)
Admission: RE | Disposition: A | Discharge status: Home / Self Care | Payer: Self-pay | Source: Ambulatory Visit | Attending: Gastroenterology

## 2018-06-21 ENCOUNTER — Encounter (HOSPITAL_COMMUNITY): Payer: Self-pay | Admitting: Anesthesiology

## 2018-06-21 ENCOUNTER — Ambulatory Visit (HOSPITAL_COMMUNITY): Payer: 59 | Admitting: Anesthesiology

## 2018-06-21 ENCOUNTER — Ambulatory Visit (HOSPITAL_COMMUNITY)
Admission: RE | Admit: 2018-06-21 | Discharge: 2018-06-21 | Disposition: A | Discharge status: Home / Self Care | Payer: 59 | Source: Ambulatory Visit | Attending: Gastroenterology | Admitting: Gastroenterology

## 2018-06-21 DIAGNOSIS — G8929 Other chronic pain: Secondary | ICD-10-CM | POA: Diagnosis not present

## 2018-06-21 DIAGNOSIS — E119 Type 2 diabetes mellitus without complications: Secondary | ICD-10-CM | POA: Insufficient documentation

## 2018-06-21 DIAGNOSIS — Z86010 Personal history of colonic polyps: Secondary | ICD-10-CM | POA: Insufficient documentation

## 2018-06-21 DIAGNOSIS — Z860101 Personal history of adenomatous and serrated colon polyps: Secondary | ICD-10-CM

## 2018-06-21 DIAGNOSIS — Z1211 Encounter for screening for malignant neoplasm of colon: Secondary | ICD-10-CM | POA: Insufficient documentation

## 2018-06-21 DIAGNOSIS — K640 First degree hemorrhoids: Secondary | ICD-10-CM | POA: Diagnosis not present

## 2018-06-21 DIAGNOSIS — D124 Benign neoplasm of descending colon: Secondary | ICD-10-CM | POA: Insufficient documentation

## 2018-06-21 DIAGNOSIS — M549 Dorsalgia, unspecified: Secondary | ICD-10-CM | POA: Diagnosis not present

## 2018-06-21 DIAGNOSIS — Z87891 Personal history of nicotine dependence: Secondary | ICD-10-CM | POA: Diagnosis not present

## 2018-06-21 DIAGNOSIS — K219 Gastro-esophageal reflux disease without esophagitis: Secondary | ICD-10-CM | POA: Diagnosis present

## 2018-06-21 DIAGNOSIS — Z7984 Long term (current) use of oral hypoglycemic drugs: Secondary | ICD-10-CM | POA: Diagnosis not present

## 2018-06-21 DIAGNOSIS — K2900 Acute gastritis without bleeding: Secondary | ICD-10-CM | POA: Diagnosis not present

## 2018-06-21 DIAGNOSIS — K5730 Diverticulosis of large intestine without perforation or abscess without bleeding: Secondary | ICD-10-CM | POA: Insufficient documentation

## 2018-06-21 DIAGNOSIS — D123 Benign neoplasm of transverse colon: Secondary | ICD-10-CM | POA: Diagnosis not present

## 2018-06-21 HISTORY — PX: POLYPECTOMY: SHX5525

## 2018-06-21 HISTORY — PX: BIOPSY: SHX5522

## 2018-06-21 HISTORY — PX: ESOPHAGOGASTRODUODENOSCOPY (EGD) WITH PROPOFOL: SHX5813

## 2018-06-21 HISTORY — PX: COLONOSCOPY WITH PROPOFOL: SHX5780

## 2018-06-21 LAB — GLUCOSE, CAPILLARY: GLUCOSE-CAPILLARY: 83 mg/dL (ref 70–99)

## 2018-06-21 SURGERY — COLONOSCOPY WITH PROPOFOL
Anesthesia: Monitor Anesthesia Care

## 2018-06-21 MED ORDER — LIDOCAINE 2% (20 MG/ML) 5 ML SYRINGE
INTRAMUSCULAR | Status: DC | PRN
  Administered 2018-06-21: 100 mg via INTRAVENOUS

## 2018-06-21 MED ORDER — SODIUM CHLORIDE 0.9 % IV SOLN: INTRAVENOUS | Status: DC

## 2018-06-21 MED ORDER — PROPOFOL 500 MG/50ML IV EMUL
INTRAVENOUS | Status: DC | PRN
  Administered 2018-06-21: 140 ug/kg/min via INTRAVENOUS

## 2018-06-21 MED ORDER — PROPOFOL 10 MG/ML IV BOLUS
INTRAVENOUS | Status: AC
  Filled 2018-06-21: qty 60

## 2018-06-21 MED ORDER — LACTATED RINGERS IV SOLN
INTRAVENOUS | Status: DC
  Administered 2018-06-21: 10:00:00 via INTRAVENOUS

## 2018-06-21 MED ORDER — PROPOFOL 10 MG/ML IV BOLUS
INTRAVENOUS | Status: DC | PRN
  Administered 2018-06-21 (×6): 20 mg via INTRAVENOUS

## 2018-06-21 SURGICAL SUPPLY — 25 items

## 2018-06-21 NOTE — Anesthesia Preprocedure Evaluation (Signed)
Anesthesia Evaluation  Patient identified by MRN, date of birth, ID band Patient awake    Reviewed: Allergy & Precautions, NPO status , Patient's Chart, lab work & pertinent test results  History of Anesthesia Complications Negative for: history of anesthetic complications  Airway Mallampati: I  TM Distance: >3 FB Neck ROM: Full    Dental  (+) Dental Advisory Given, Poor Dentition, Chipped, Caps   Pulmonary former smoker (quit about a year),    breath sounds clear to auscultation       Cardiovascular (-) anginanegative cardio ROS   Rhythm:Regular Rate:Normal     Neuro/Psych Chronic back pain: narcotics    GI/Hepatic Neg liver ROS, GERD  Medicated and Controlled,  Endo/Other  diabetes, Oral Hypoglycemic Agents  Renal/GU negative Renal ROS     Musculoskeletal   Abdominal   Peds  Hematology negative hematology ROS (+)   Anesthesia Other Findings   Reproductive/Obstetrics                             Anesthesia Physical Anesthesia Plan  ASA: III  Anesthesia Plan: MAC   Post-op Pain Management:    Induction:   PONV Risk Score and Plan: 1 and Treatment may vary due to age or medical condition  Airway Management Planned: Natural Airway and Nasal Cannula  Additional Equipment:   Intra-op Plan:   Post-operative Plan:   Informed Consent: I have reviewed the patients History and Physical, chart, labs and discussed the procedure including the risks, benefits and alternatives for the proposed anesthesia with the patient or authorized representative who has indicated his/her understanding and acceptance.   Dental advisory given  Plan Discussed with: CRNA and Surgeon  Anesthesia Plan Comments: (Plan routine monitors, MAC)        Anesthesia Quick Evaluation

## 2018-06-21 NOTE — H&P (Signed)
Date of Initial H&P: 06/17/18  History reviewed, patient examined, no change in status, stable for surgery.

## 2018-06-21 NOTE — Transfer of Care (Signed)
Immediate Anesthesia Transfer of Care Note  Patient: Justin Key  Procedure(s) Performed: COLONOSCOPY WITH PROPOFOL (N/A ) ESOPHAGOGASTRODUODENOSCOPY (EGD) WITH PROPOFOL (N/A ) POLYPECTOMY  Patient Location: Endoscopy Unit  Anesthesia Type:MAC  Level of Consciousness: awake and alert   Airway & Oxygen Therapy: Patient Spontanous Breathing  Post-op Assessment: Report given to RN and Post -op Vital signs reviewed and stable  Post vital signs: Reviewed and stable  Last Vitals:  Vitals Value Taken Time  BP    Temp    Pulse 87 06/21/2018 11:47 AM  Resp 15 06/21/2018 11:47 AM  SpO2 100 % 06/21/2018 11:47 AM  Vitals shown include unvalidated device data.  Last Pain:  Vitals:   06/21/18 1020  TempSrc: Oral  PainSc: 0-No pain         Complications: No apparent anesthesia complications

## 2018-06-21 NOTE — Anesthesia Procedure Notes (Signed)
Date/Time: 06/21/2018 10:49 AM Performed by: Sharlette Dense, CRNA Oxygen Delivery Method: Nasal cannula

## 2018-06-21 NOTE — Op Note (Signed)
[facility] Patient Name: Justin Key Procedure Date: 06/21/2018 MRN: [id number] Attending MD: Lear Ng , MD Date of Birth: 1951/07/03 CSN: [id number] Age: 67 Admit Type: Outpatient Procedure:                Upper GI endoscopy Indications:              Heartburn, Esophageal reflux Providers:                Lear Ng, MD, Vista Lawman, RN, Charolette Child, Technician, Danley Danker, CRNA Referring MD:             Kathyrn Lass Medicines:                Propofol per Anesthesia, Monitored Anesthesia Care Complications:            No immediate complications. Estimated Blood Loss:     Estimated blood loss: none. Procedure:                Pre-Anesthesia Assessment:                           - Prior to the procedure, a History and Physical                            was performed, and patient medications and                            allergies were reviewed. The patient's tolerance of                            previous anesthesia was also reviewed. The risks                            and benefits of the procedure and the sedation                            options and risks were discussed with the patient.                            All questions were answered, and informed consent                            was obtained. Prior Anticoagulants: The patient has                            taken no previous anticoagulant or antiplatelet                            agents. ASA Grade Assessment: III - A patient with                            severe systemic disease. After reviewing the risks  and benefits, the patient was deemed in                            satisfactory condition to undergo the procedure.                           After obtaining informed consent, the endoscope was                            passed under direct vision. Throughout the                            procedure, the patient's  blood pressure, pulse, and                            oxygen saturations were monitored continuously. The                            [id number]-H190 ([id number]) Olympus adult endoscope was                            introduced through the mouth, and advanced to the                            second part of duodenum. The upper GI endoscopy was                            accomplished without difficulty. The patient                            tolerated the procedure well. Scope In: Scope Out: Findings:      The Z-line was regular and was found 45 cm from the incisors.      The examined esophagus was normal.      Segmental mild inflammation characterized by congestion (edema) and       erythema was found in the gastric antrum.      The cardia and gastric fundus were normal on retroflexion.      The examined duodenum was normal. Impression:               - Z-line regular, 45 cm from the incisors.                           - Normal esophagus.                           - Acute gastritis.                           - Normal examined duodenum.                           - No specimens collected. Moderate Sedation:      N/A- Per Anesthesia Care Recommendation:           - Patient has a contact number available for  emergencies. The signs and symptoms of potential                            delayed complications were discussed with the                            patient. Return to normal activities tomorrow.                            Written discharge instructions were provided to the                            patient.                           - Follow an antireflux regimen. Procedure Code(s):        --- Professional ---                           [phone], Esophagogastroduodenoscopy, flexible,                            transoral; diagnostic, including collection of                            specimen(s) by brushing or washing, when performed                            (separate  procedure) Diagnosis Code(s):        --- Professional ---                           K21.9, Gastro-esophageal reflux disease without                            esophagitis                           R12, Heartburn                           K29.00, Acute gastritis without bleeding CPT copyright 2017 American Medical Association. All rights reserved. The codes documented in this report are preliminary and upon coder review may  be revised to meet current compliance requirements. Lear Ng, MD 06/21/2018 11:52:13 AM This report has been signed electronically. Number of Addenda: 0

## 2018-06-21 NOTE — Discharge Instructions (Signed)

## 2018-06-21 NOTE — Anesthesia Postprocedure Evaluation (Signed)
Anesthesia Post Note  Patient: Justin Key  Procedure(s) Performed: COLONOSCOPY WITH PROPOFOL (N/A ) ESOPHAGOGASTRODUODENOSCOPY (EGD) WITH PROPOFOL (N/A ) POLYPECTOMY     Patient location during evaluation: Endoscopy Anesthesia Type: MAC Level of consciousness: awake and alert, oriented and patient cooperative Pain management: pain level controlled Vital Signs Assessment: post-procedure vital signs reviewed and stable Respiratory status: spontaneous breathing, nonlabored ventilation and respiratory function stable Cardiovascular status: blood pressure returned to baseline and stable Postop Assessment: no apparent nausea or vomiting Anesthetic complications: no    Last Vitals:  Vitals:   06/21/18 1146 06/21/18 1150  BP: (!) 151/107 (!) 153/82  Pulse: 85 72  Resp: (!) 21 19  Temp:  36.4 C  SpO2: 100% 100%    Last Pain:  Vitals:   06/21/18 1150  TempSrc: Oral  PainSc: 0-No pain                 Melissaann Dizdarevic,E. Madix Blowe

## 2018-06-21 NOTE — Interval H&P Note (Signed)
History and Physical Interval Note:  06/21/2018 10:34 AM  Justin Key  has presented today for surgery, with the diagnosis of History of colon polyps/GERD  The various methods of treatment have been discussed with the patient and family. After consideration of risks, benefits and other options for treatment, the patient has consented to  Procedure(s): COLONOSCOPY WITH PROPOFOL (N/A) ESOPHAGOGASTRODUODENOSCOPY (EGD) WITH PROPOFOL (N/A) as a surgical intervention .  The patient's history has been reviewed, patient examined, no change in status, stable for surgery.  I have reviewed the patient's chart and labs.  Questions were answered to the patient's satisfaction.     [place] C.

## 2018-06-21 NOTE — Op Note (Signed)
[facility] Patient Name: Justin Key Procedure Date: 06/21/2018 MRN: [id number] Attending MD: Lear Ng , MD Date of Birth: July 22, 1951 CSN: [id number] Age: 67 Admit Type: Outpatient Procedure:                Colonoscopy Indications:              High risk colon cancer surveillance: Personal                            history of colonic polyps, Last colonoscopy: May                            2016 Providers:                Lear Ng, MD, Vista Lawman, RN, Charolette Child, Technician, Danley Danker, CRNA Referring MD:             Kathyrn Lass Medicines:                Propofol per Anesthesia, Monitored Anesthesia Care Complications:            No immediate complications. Estimated Blood Loss:     Estimated blood loss was minimal. Procedure:                Pre-Anesthesia Assessment:                           - Prior to the procedure, a History and Physical                            was performed, and patient medications and                            allergies were reviewed. The patient's tolerance of                            previous anesthesia was also reviewed. The risks                            and benefits of the procedure and the sedation                            options and risks were discussed with the patient.                            All questions were answered, and informed consent                            was obtained. Prior Anticoagulants: The patient has                            taken no previous anticoagulant or antiplatelet  agents. ASA Grade Assessment: III - A patient with                            severe systemic disease. After reviewing the risks                            and benefits, the patient was deemed in                            satisfactory condition to undergo the procedure.                           After obtaining informed consent, the colonoscope                          was passed under direct vision. Throughout the                            procedure, the patient's blood pressure, pulse, and                            oxygen saturations were monitored continuously. The                            [id number]-H190DL ([id number]) Olympus peds colonscope was                            introduced through the anus and advanced to the the                            cecum, identified by appendiceal orifice and                            ileocecal valve. The colonoscopy was extremely                            difficult due to inadequate bowel prep, significant                            looping and a tortuous colon. Successful completion                            of the procedure was aided by straightening and                            shortening the scope to obtain bowel loop reduction                            and lavage. The patient tolerated the procedure                            well. The quality of the bowel preparation was  poor. Technical difficulties prevented                            photodocumentation. Scope In: 11:06:02 AM Scope Out: 11:39:26 AM Scope Withdrawal Time: 0 hours 24 minutes 1 second  Total Procedure Duration: 0 hours 33 minutes 24 seconds  Findings:      The perianal and digital rectal examinations were normal.      Two sessile and semi-pedunculated polyps were found in the transverse       colon. The polyps were 5 to 8 mm in size. These polyps were removed with       a hot snare. Resection and retrieval were complete. Estimated blood       loss: none.      A 3 mm polyp was found in the descending colon. The polyp was       semi-sessile. The polyp was removed with a cold biopsy forceps.       Resection and retrieval were complete. Estimated blood loss was minimal.      A 8 mm polyp was found in the descending colon. The polyp was       semi-pedunculated. The polyp was removed with a hot  snare. Resection and       retrieval were complete. Estimated blood loss: none.      Copious quantities of semi-liquid semi-solid stool was found in the       entire colon, precluding visualization.      Scattered small and large-mouthed diverticula were found in the sigmoid       colon.      Internal hemorrhoids were found during endoscopy. The hemorrhoids were       small and Grade I (internal hemorrhoids that do not prolapse).      Due to poor prep retroflexion was not attempted. Impression:               - Preparation of the colon was poor.                           - Two 5 to 8 mm polyps in the transverse colon,                            removed with a hot snare. Resected and retrieved.                           - One 3 mm polyp in the descending colon, removed                            with a cold biopsy forceps. Resected and retrieved.                           - One 8 mm polyp in the descending colon, removed                            with a hot snare. Resected and retrieved.                           - Stool in the entire examined colon.                           -  Diverticulosis in the sigmoid colon.                           - Internal hemorrhoids. Moderate Sedation:      N/A- Per Anesthesia Care Recommendation:           - Patient has a contact number available for                            emergencies. The signs and symptoms of potential                            delayed complications were discussed with the                            patient. Return to normal activities tomorrow.                            Written discharge instructions were provided to the                            patient.                           - High fiber diet.                           - Await pathology results.                           - No aspirin, ibuprofen, naproxen, or other                            non-steroidal anti-inflammatory drugs for 2 weeks.                           -  Repeat colonoscopy at appointment to be scheduled                            because the bowel preparation was poor. Procedure Code(s):        --- Professional ---                           [id number], Colonoscopy, flexible; with removal of                            tumor(s), polyp(s), or other lesion(s) by snare                            technique                           85631, 36, Colonoscopy, flexible; with biopsy,                            single or multiple Diagnosis Code(s):        --- Professional ---  Z86.010, Personal history of colonic polyps                           D12.3, Benign neoplasm of transverse colon (hepatic                            flexure or splenic flexure)                           D12.4, Benign neoplasm of descending colon                           K64.0, First degree hemorrhoids                           K57.30, Diverticulosis of large intestine without                            perforation or abscess without bleeding CPT copyright 2017 American Medical Association. All rights reserved. The codes documented in this report are preliminary and upon coder review may  be revised to meet current compliance requirements. Lear Ng, MD 06/21/2018 12:05:45 PM This report has been signed electronically. Number of Addenda: 0

## 2018-06-23 ENCOUNTER — Encounter (HOSPITAL_COMMUNITY): Payer: Self-pay | Admitting: Gastroenterology

## 2018-07-08 MED FILL — JANUVIA 100 MG TABLET: 100 | 30 days supply | Qty: 30 | Fill #1

## 2018-07-13 ENCOUNTER — Encounter

## 2018-07-13 ENCOUNTER — Ambulatory Visit (INDEPENDENT_AMBULATORY_CARE_PROVIDER_SITE_OTHER): Payer: 59 | Admitting: Interventional Cardiology

## 2018-07-13 ENCOUNTER — Encounter: Payer: Self-pay | Admitting: Interventional Cardiology

## 2018-07-13 VITALS — BP 138/82 | HR 97 | Ht 75.0 in | Wt 158.4 lb

## 2018-07-13 DIAGNOSIS — E1100 Type 2 diabetes mellitus with hyperosmolarity without nonketotic hyperglycemic-hyperosmolar coma (NKHHC): Secondary | ICD-10-CM

## 2018-07-13 DIAGNOSIS — F419 Anxiety disorder, unspecified: Secondary | ICD-10-CM | POA: Diagnosis not present

## 2018-07-13 DIAGNOSIS — E785 Hyperlipidemia, unspecified: Secondary | ICD-10-CM | POA: Diagnosis not present

## 2018-07-13 DIAGNOSIS — I10 Essential (primary) hypertension: Secondary | ICD-10-CM | POA: Diagnosis not present

## 2018-07-13 DIAGNOSIS — R0789 Other chest pain: Secondary | ICD-10-CM | POA: Diagnosis not present

## 2018-07-13 DIAGNOSIS — G894 Chronic pain syndrome: Secondary | ICD-10-CM | POA: Diagnosis not present

## 2018-07-13 MED ORDER — METOPROLOL TARTRATE 100 MG PO TABS: 100.0000 mg | ORAL_TABLET | ORAL | 0 refills | Status: DC

## 2018-07-13 NOTE — Progress Notes (Signed)
Cardiology Office Note:    Date:  07/13/2018   ID:  Justin Key, DOB 16-Sep-1951, MRN [id number]  PCP:  Kathyrn Lass, MD  Cardiologist:  Sinclair Grooms, MD   Referring MD: Kathyrn Lass, MD   Chief Complaint  Patient presents with  . Coronary Artery Disease    History of Present Illness:    Justin Key is a 67 y.o. male with a hx of CAD with prior myocardial infarction 1997 treated with angioplasty.  Long-standing history of type 2 diabetes, hypertension, chronic pain syndrome related to lumbar disc disease, and hyperlipidemia.  He has not been seen as a patient in greater than 10 years.  Justin Key has not been seen in quite some time.  He has a long-standing and diabetes mellitus.  No records or present in the electronic health record to document prior procedures.  My recollection is that he received angioplasty during acute infarction greater than 20 years ago.  He has been relatively stable since that time.  His greatest difficulty is chronic back pain related to multiple lumbar disc surgeries performed over the years.  He has a chronic pain syndrome.  He had pain medication dependence.  He is now off of the pain medications, is not able to sleep well, has lost weight due to gastroparesis and inability to eat, and has poor appetite.  Medicinal THC has been somewhat helpful in pain control and improving diet.  He has had various types of discomfort in the chest.  He clearly has palpitations which frightened him when they occur.  He also complains of discomfort getting worse with taking a deep breath, it is occasionally associated with left arm numbness but not necessarily at the same time.  Episodes of discomfort last minutes and resolved.  There is no associated dyspnea.  He also has left subclavicular discomfort.  He states that he is frightened his heart may stop.  He has not needed to use nitroglycerin.  He is very emotional and at times tearful during the  encounter.  Past Medical History:  Diagnosis Date  . Back pain   . Diabetes mellitus without complication ([place])   . Failure to thrive in adult   . GERD (gastroesophageal reflux disease)   . Myocardial infarction ([place]) 1997    Past Surgical History:  Procedure Laterality Date  . BACK SURGERY     microdiskectomy x2  . BIOPSY  06/21/2018   Procedure: BIOPSY;  Surgeon: Wilford Corner, MD;  Location: [facility];  Service: Endoscopy;;  . COLONOSCOPY  07/12/2012   Procedure: COLONOSCOPY;  Surgeon: Lear Ng, MD;  Location: [facility];  Service: Endoscopy;  Laterality: N/A;  . COLONOSCOPY WITH PROPOFOL N/A 06/21/2018   Procedure: COLONOSCOPY WITH PROPOFOL;  Surgeon: Wilford Corner, MD;  Location: [facility];  Service: Endoscopy;  Laterality: N/A;  . ESOPHAGEAL MANOMETRY N/A 11/15/2017   Procedure: ESOPHAGEAL MANOMETRY (EM);  Surgeon: Wilford Corner, MD;  Location: [facility];  Service: Endoscopy;  Laterality: N/A;  . ESOPHAGOGASTRODUODENOSCOPY  07/12/2012   Procedure: ESOPHAGOGASTRODUODENOSCOPY (EGD);  Surgeon: Lear Ng, MD;  Location: [facility];  Service: Endoscopy;  Laterality: N/A;  . ESOPHAGOGASTRODUODENOSCOPY (EGD) WITH PROPOFOL N/A 06/21/2018   Procedure: ESOPHAGOGASTRODUODENOSCOPY (EGD) WITH PROPOFOL;  Surgeon: Wilford Corner, MD;  Location: [facility];  Service: Endoscopy;  Laterality: N/A;  . KNEE ARTHROSCOPY  1988   Left  . POLYPECTOMY  06/21/2018   Procedure: POLYPECTOMY;  Surgeon: Wilford Corner, MD;  Location: [facility];  Service: Endoscopy;;  .  SPINAL CORD STIMULATOR IMPLANT    . SPINAL CORD STIMULATOR REMOVAL    . SPINAL FUSION     x 3    Current Medications: Current Meds  Medication Sig  . aspirin 325 MG tablet Take 325 mg by mouth daily.   Marland Kitchen docusate sodium (COLACE) 100 MG capsule Take 100 mg by mouth 2 (two) times daily.  Marland Kitchen glimepiride (AMARYL) 4 MG tablet Take 8 mg by mouth daily with breakfast.   . ibuprofen  (ADVIL,MOTRIN) 200 MG tablet Take 800 mg by mouth every 8 (eight) hours as needed (for pain.).  Marland Kitchen JANUVIA 100 MG tablet Take 100 mg daily by mouth.  . lidocaine (LIDODERM) 5 % Place 1 patch onto the skin daily as needed (for back pain.).   Marland Kitchen MELATONIN PO Take 12 mg by mouth at bedtime as needed (for sleep).  . metFORMIN (GLUCOPHAGE) 1000 MG tablet Take 1,000-1,500 mg by mouth See admin instructions. Take 1.5 tablets (1500 mg) by mouth daily in the morning & take 1 tablet (1000 mg) by mouth daily in the evening  . pantoprazole (PROTONIX) 40 MG tablet Take 40 mg by mouth 2 (two) times daily.  . traZODone (DESYREL) 50 MG tablet Take 50-100 mg by mouth at bedtime as needed for sleep.     Allergies:   Humibid la [guaifenesin]   Social History   Socioeconomic History  . Marital status: Married    Spouse name: Not on file  . Number of children: Not on file  . Years of education: Not on file  . Highest education level: Not on file  Occupational History  . Not on file  Social Needs  . Financial resource strain: Not on file  . Food insecurity:    Worry: Not on file    Inability: Not on file  . Transportation needs:    Medical: Not on file    Non-medical: Not on file  Tobacco Use  . Smoking status: Former Smoker    Packs/day: 1.00    Years: 20.00    Pack years: 20.00    Types: Cigarettes    Last attempt to quit: 04/09/2012    Years since quitting: 6.2  . Smokeless tobacco: Never Used  . Tobacco comment: smoking THC for pain/appetite   Substance and Sexual Activity  . Alcohol use: Yes  . Drug use: No  . Sexual activity: Yes    Birth control/protection: None  Lifestyle  . Physical activity:    Days per week: Not on file    Minutes per session: Not on file  . Stress: Not on file  Relationships  . Social connections:    Talks on phone: Not on file    Gets together: Not on file    Attends religious service: Not on file    Active member of club or organization: Not on file     Attends meetings of clubs or organizations: Not on file    Relationship status: Not on file  Other Topics Concern  . Not on file  Social History Narrative  . Not on file     Family History: The patient's family history is not on file.  ROS:   Please see the history of present illness.    Unexplained weight gain, poor appetite, chills, shortness of breath at times while recumbent, vision disturbance, abdominal discomfort, depression, back pain, rash, difficulty with urination, erectile dysfunction, patient, potation's.  All other systems reviewed and are negative.  EKGs/Labs/Other Studies Reviewed:    The  following studies were reviewed today: No recent cardiovascular evaluation or data.  EKG:  EKG is ordered today.  The ekg ordered today demonstrates sinus rhythm, PACs, biatrial abnormality, vertical axis, inferior T wave abnormality.  When compared to prior EKG from 07/2017, no significant change has occurred.  Recent Labs: 07/29/2017: ALT 21; BUN 20; Creatinine, Ser 0.94; Hemoglobin 14.6; Platelets 297; Potassium 4.0; Sodium 137  Recent Lipid Panel    Component Value Date/Time   CHOL  12/18/2008 0315    179        ATP III CLASSIFICATION:  <200     mg/dL   Desirable  200-239  mg/dL   Borderline High  >=240    mg/dL   High          TRIG 64 12/18/2008 0315   HDL 42 12/18/2008 0315   CHOLHDL 4.3 12/18/2008 0315   VLDL 13 12/18/2008 0315   LDLCALC (H) 12/18/2008 0315    124        Total Cholesterol/HDL:CHD Risk Coronary Heart Disease Risk Table                     Men   Women  1/2 Average Risk   3.4   3.3  Average Risk       5.0   4.4  2 X Average Risk   9.6   7.1  3 X Average Risk  23.4   11.0        Use the calculated Patient Ratio above and the CHD Risk Table to determine the patient's CHD Risk.        ATP III CLASSIFICATION (LDL):  <100     mg/dL   Optimal  100-129  mg/dL   Near or Above                    Optimal  130-159  mg/dL   Borderline  160-189  mg/dL    High  >190     mg/dL   Very High    Physical Exam:    VS:  BP 138/82   Pulse 97   Ht 6\' 3"  (1.905 m)   Wt 158 lb 6.4 oz (71.8 kg)   BMI 19.80 kg/m     Wt Readings from Last 3 Encounters:  07/13/18 158 lb 6.4 oz (71.8 kg)  06/21/18 163 lb (73.9 kg)  10/12/17 162 lb 12.8 oz (73.8 kg)     GEN: Has lost significant weight and appears somewhat cachectic compared to baseline.  Well nourished, well developed in no acute distress HEENT: Normal NECK: No JVD. LYMPHATICS: No lymphadenopathy CARDIAC: RRR, no murmur, no gallop, no edema. VASCULAR: 2+ bilateral radial and posterior tibial pulses.  No bruits. RESPIRATORY:  Clear to auscultation without rales, wheezing or rhonchi  ABDOMEN: Soft, non-tender, non-distended, No pulsatile mass, MUSCULOSKELETAL: No deformity  SKIN: Warm and dry NEUROLOGIC:  Alert and oriented x 3 PSYCHIATRIC:  Normal affect   ASSESSMENT:    1. Other chest pain   2. Type 2 diabetes mellitus with hyperosmolarity without coma, without long-term current use of insulin ([place])   3. Hyperlipidemia LDL goal <70   4. Essential hypertension   5. Chronic pain syndrome   6. Anxiety    PLAN:    In order of problems listed above:  1. The chest pain is atypical.  There are varied components to the discomfort.  Episodes are not exertion related although he is very limited by chronic back pain, stiffness,  and difficulty with mobility.  Given the prior history of CAD/myocardial infarction requiring PCI, we will perform a coronary assessment.  He does not want to have a pharmacologic nuclear study because of the impact that the side effects from Lexiscan/adenosine.  He has had 1 of the remote past.  We will therefore perform a coronary CT with morphology and FFR if needed to fully assess coronary patency in this setting.  I do note the presence of "coronary calcification" on a chest CT performed 1 year ago. 2. Hemoglobin A1c target is less than 7 with most recent number of  8.3. 3. LDL target is less than 70.  No recent lipid data is available.  Primary care is at [place] [facility]. 4. Blood pressure is slightly above target of 130/80 mmHg.  Not currently on an antihypertensive.  Should potentially consider intensifying therapy preferably with an angiotensin-converting enzyme inhibitor or ARB given diabetes. 5. Related to chronic severe lumbar disease with chronic pain.  No longer on opiate therapy due to dependence. 6. Associated with severe insomnia.  Chest pain is present but difficult to tell if it is ischemic in nature.  Several component some of which is surely nonischemic in nature.  Plan coronary CT with morphology to assess coronary patency.  FFR will be used if needed to determine if there is significant stenosis.  He does have coronary calcification.  He refused nuclear imaging.  Symptoms are atypical enough that coronary angiography did not seem appropriate as a first step.  Long discussion concerning risk modification, and in his case there is significant risks imposed by emotional stress pain.  He is working with his primary physician to help better control this.  He is accompanied by his wife.  Secondary risk modification is discussed as outlined above.  Glycemic control could be better.  I do not see any recent lipid data.  This needs to be evaluated.  Blood pressure may also need better control.  He is unable to exercise because of his back.  Greater than 50% of the time during this office visit was spent in education, counseling, and coordination of care related to underlying disease process and testing as outlined.  7.    Medication Adjustments/Labs and Tests Ordered: Current medicines are reviewed at length with the patient today.  Concerns regarding medicines are outlined above.  Orders Placed This Encounter  Procedures  . CT CORONARY MORPH W/CTA COR W/SCORE W/CA W/CM &/OR WO/CM  . CT CORONARY FRACTIONAL FLOW RESERVE DATA  PREP  . CT CORONARY FRACTIONAL FLOW RESERVE FLUID ANALYSIS  . EKG 12-Lead   Meds ordered this encounter  Medications  . metoprolol tartrate (LOPRESSOR) 100 MG tablet    Sig: Take 1 tablet (100 mg total) by mouth as directed.    Dispense:  1 tablet    Refill:  0    Patient Instructions  Your physician recommends that you continue on your current medications as directed. Please refer to the Current Medication list given to you today.   Your physician recommends that you schedule a follow-up appointment in: PENDING CT RESULTS    Please arrive at the [facility] main entrance of [facility] at xx:xx AM (30-45 minutes prior to test start time)  [facility] [place], [place] [address] [phone]  Proceed to the [facility] Radiology Department (First Floor).  Please follow these instructions carefully (unless otherwise directed):  Hold all erectile dysfunction medications at least 48 hours  prior to test.  On the Night Before the Test: . Be sure to Drink plenty of water. . Do not consume any caffeinated/decaffeinated beverages or chocolate 12 hours prior to your test. . Do not take any antihistamines 12 hours prior to your test. . If you take Metformin do not take 24 hours prior to test. If On the Day of the Test: . Drink plenty of water. Do not drink any water within one hour of the test. . Do not eat any food 4 hours prior to the test. . You may take your regular medications prior to the test.  . Take metoprolol (Lopressor) two hours prior to test. . HOLD Furosemide/Hydrochlorothiazide morning of the test.   *For Clinical Staff only. Please instruct patient the following:*        -Drink plenty of water       -Hold Furosemide/hydrochlorothiazide morning of the test       -Take metoprolol (Lopressor) 2 hours prior to test (if applicable).                  -If HR is less than 55 BPM- No Beta Blocker                -IF HR is greater  than 55 BPM and patient is less than or equal to 12 yrs old Lopressor 100mg  x1.                -If HR is greater than 55 BPM and patient is greater than 57 yrs old Lopressor 50 mg x1.     Do not give Lopressor to patients with an allergy to lopressor or anyone with asthma or active COPD symptoms (currently taking steroids).       After the Test: . Drink plenty of water. . After receiving IV contrast, you may experience a mild flushed feeling. This is normal. . On occasion, you may experience a mild rash up to 24 hours after the test. This is not dangerous. If this occurs, you can take Benadryl 25 mg and increase your fluid intake. . If you experience trouble breathing, this can be serious. If it is severe call 911 IMMEDIATELY. If it is mild, please call our office. . If you take any of these medications: Glipizide/Metformin, Avandament, Glucavance, please do not take 48 hours after completing test.     Signed, Sinclair Grooms, MD  07/13/2018 4:48 PM    [place]

## 2018-07-13 NOTE — Patient Instructions (Addendum)
Your physician recommends that you continue on your current medications as directed. Please refer to the Current Medication list given to you today.   Your physician recommends that you schedule a follow-up appointment in: PENDING CT RESULTS    Please arrive at the [facility] main entrance of [facility] at xx:xx AM (30-45 minutes prior to test start time)  [facility] [place], [place] [address] [phone]  Proceed to the [facility] Radiology Department (First Floor).  Please follow these instructions carefully (unless otherwise directed):  Hold all erectile dysfunction medications at least 48 hours prior to test.  On the Night Before the Test: . Be sure to Drink plenty of water. . Do not consume any caffeinated/decaffeinated beverages or chocolate 12 hours prior to your test. . Do not take any antihistamines 12 hours prior to your test. . If you take Metformin do not take 24 hours prior to test. If On the Day of the Test: . Drink plenty of water. Do not drink any water within one hour of the test. . Do not eat any food 4 hours prior to the test. . You may take your regular medications prior to the test.  . Take metoprolol (Lopressor) two hours prior to test. . HOLD Furosemide/Hydrochlorothiazide morning of the test.   *For Clinical Staff only. Please instruct patient the following:*        -Drink plenty of water       -Hold Furosemide/hydrochlorothiazide morning of the test       -Take metoprolol (Lopressor) 2 hours prior to test (if applicable).                  -If HR is less than 55 BPM- No Beta Blocker                -IF HR is greater than 55 BPM and patient is less than or equal to 37 yrs old Lopressor 100mg  x1.                -If HR is greater than 55 BPM and patient is greater than 74 yrs old Lopressor 50 mg x1.     Do not give Lopressor to patients with an allergy to lopressor or anyone with asthma or active COPD symptoms  (currently taking steroids).       After the Test: . Drink plenty of water. . After receiving IV contrast, you may experience a mild flushed feeling. This is normal. . On occasion, you may experience a mild rash up to 24 hours after the test. This is not dangerous. If this occurs, you can take Benadryl 25 mg and increase your fluid intake. . If you experience trouble breathing, this can be serious. If it is severe call 911 IMMEDIATELY. If it is mild, please call our office. . If you take any of these medications: Glipizide/Metformin, Avandament, Glucavance, please do not take 48 hours after completing test.

## 2018-07-14 MED FILL — METOPROLOL TARTRATE 100 MG: 100 | 1 days supply | Qty: 1 | Fill #0

## 2018-07-18 DIAGNOSIS — I10 Essential (primary) hypertension: Secondary | ICD-10-CM | POA: Diagnosis not present

## 2018-07-18 DIAGNOSIS — E7800 Pure hypercholesterolemia, unspecified: Secondary | ICD-10-CM | POA: Diagnosis not present

## 2018-07-18 DIAGNOSIS — E1165 Type 2 diabetes mellitus with hyperglycemia: Secondary | ICD-10-CM | POA: Diagnosis not present

## 2018-07-18 DIAGNOSIS — Z9119 Patient's noncompliance with other medical treatment and regimen: Secondary | ICD-10-CM | POA: Diagnosis not present

## 2018-07-18 MED FILL — metFORMIN HCL ER 500 MG TB2: 500 | 30 days supply | Qty: 60 | Fill #0

## 2018-07-20 MED FILL — traZODone HCL 50 MG TABS: 50 | 30 days supply | Qty: 60 | Fill #1

## 2018-07-28 MED FILL — FREESTYLE LITE TEST STRIP: 90 days supply | Qty: 200 | Fill #0

## 2018-08-08 ENCOUNTER — Other Ambulatory Visit: Payer: 59

## 2018-08-09 ENCOUNTER — Other Ambulatory Visit: Payer: 59

## 2018-08-09 ENCOUNTER — Other Ambulatory Visit: Payer: Self-pay | Admitting: *Deleted

## 2018-08-09 DIAGNOSIS — R079 Chest pain, unspecified: Secondary | ICD-10-CM | POA: Diagnosis not present

## 2018-08-10 LAB — BASIC METABOLIC PANEL
BUN/Creatinine Ratio: 19 (ref 10–24)
BUN: 19 mg/dL (ref 8–27)
CHLORIDE: 102 mmol/L (ref 96–106)
CO2: 23 mmol/L (ref 20–29)
Calcium: 9 mg/dL (ref 8.6–10.2)
Creatinine, Ser: 1.02 mg/dL (ref 0.76–1.27)
GFR calc non Af Amer: 76 mL/min/{1.73_m2} (ref >59)
GFR, EST AFRICAN AMERICAN: 88 mL/min/{1.73_m2} (ref >59)
Glucose: 248 mg/dL — ABNORMAL HIGH (ref 65–99)
POTASSIUM: 4.3 mmol/L (ref 3.5–5.2)
Sodium: 139 mmol/L (ref 134–144)

## 2018-08-15 ENCOUNTER — Ambulatory Visit: Payer: 59 | Admitting: Interventional Cardiology

## 2018-08-16 ENCOUNTER — Ambulatory Visit (HOSPITAL_COMMUNITY): Payer: 59

## 2018-08-16 ENCOUNTER — Telehealth: Payer: Self-pay | Admitting: Interventional Cardiology

## 2018-08-16 ENCOUNTER — Encounter: Payer: Self-pay | Admitting: *Deleted

## 2018-08-16 DIAGNOSIS — R079 Chest pain, unspecified: Secondary | ICD-10-CM

## 2018-08-16 DIAGNOSIS — Z01812 Encounter for preprocedural laboratory examination: Secondary | ICD-10-CM

## 2018-08-16 MED FILL — JANUVIA 100 MG TABLET: 100 | 30 days supply | Qty: 30 | Fill #2

## 2018-08-16 NOTE — Telephone Encounter (Signed)
Spoke with wife and went over instructions.  Advised her to have pt pick up letter of instructions when he comes for his labs tomorrow.  All questions answered.  Wife verbalized understanding and was appreciative for call.

## 2018-08-16 NOTE — Telephone Encounter (Signed)
Dr. Tamala Julian spoke with Dr. Meda Coffee who recommended not doing the Coronary CT but a Lexiscan instead.  Dr. Tamala Julian spoke with pt who preferred to not do a Lexi so Dr. Tamala Julian recommended heart cath.  Pt agreeable to this.  Called and spoke with pt's wife, DPR on file.  She said ok to schedule cath for 12/4 and pt can come for labs tomorrow.  Advised I will call to schedule and then call back later today with instructions.  Wife verbalized understanding and was appreciative for call.

## 2018-08-17 ENCOUNTER — Other Ambulatory Visit: Payer: 59 | Admitting: *Deleted

## 2018-08-17 DIAGNOSIS — Z01812 Encounter for preprocedural laboratory examination: Secondary | ICD-10-CM | POA: Diagnosis not present

## 2018-08-17 DIAGNOSIS — R079 Chest pain, unspecified: Secondary | ICD-10-CM

## 2018-08-18 LAB — BASIC METABOLIC PANEL
BUN / CREAT RATIO: 20 (ref 10–24)
BUN: 16 mg/dL (ref 8–27)
CHLORIDE: 101 mmol/L (ref 96–106)
CO2: 23 mmol/L (ref 20–29)
Calcium: 9.4 mg/dL (ref 8.6–10.2)
Creatinine, Ser: 0.82 mg/dL (ref 0.76–1.27)
GFR calc Af Amer: 106 mL/min/{1.73_m2} (ref >59)
GFR calc non Af Amer: 92 mL/min/{1.73_m2} (ref >59)
GLUCOSE: 262 mg/dL — AB (ref 65–99)
POTASSIUM: 4.1 mmol/L (ref 3.5–5.2)
SODIUM: 139 mmol/L (ref 134–144)

## 2018-08-18 LAB — CBC
Hematocrit: 39.7 % (ref 37.5–51.0)
Hemoglobin: 13.5 g/dL (ref 13.0–17.7)
MCH: 29.5 pg (ref 26.6–33.0)
MCHC: 34 g/dL (ref 31.5–35.7)
MCV: 87 fL (ref 79–97)
PLATELETS: 246 10*3/uL (ref 150–450)
RBC: 4.57 x10E6/uL (ref 4.14–5.80)
RDW: 13.5 % (ref 12.3–15.4)
WBC: 7.5 10*3/uL (ref 3.4–10.8)

## 2018-08-22 DIAGNOSIS — B351 Tinea unguium: Secondary | ICD-10-CM | POA: Diagnosis not present

## 2018-08-22 DIAGNOSIS — B353 Tinea pedis: Secondary | ICD-10-CM | POA: Diagnosis not present

## 2018-08-22 DIAGNOSIS — B352 Tinea manuum: Secondary | ICD-10-CM | POA: Diagnosis not present

## 2018-08-22 DIAGNOSIS — B360 Pityriasis versicolor: Secondary | ICD-10-CM | POA: Diagnosis not present

## 2018-08-22 DIAGNOSIS — Z5181 Encounter for therapeutic drug level monitoring: Secondary | ICD-10-CM | POA: Diagnosis not present

## 2018-08-22 MED FILL — TERBINAFINE HCL 250 MG TAB: 250 | 60 days supply | Qty: 60 | Fill #0

## 2018-08-22 MED FILL — KETOCONAZOLE 2% CREAM: 2 | 15 days supply | Qty: 30 | Fill #0

## 2018-08-23 ENCOUNTER — Telehealth: Payer: Self-pay | Admitting: *Deleted

## 2018-08-23 MED FILL — traZODone HCL 50 MG TABS: 50 | 30 days supply | Qty: 60 | Fill #2

## 2018-08-23 NOTE — Telephone Encounter (Addendum)
Pt contacted pre-catheterization scheduled at [facility] for: Wednesday August 24, 2018 10 AM Verified arrival time and place: [facility] at: 8 AM  No solid food after midnight prior to cath, clear liquids until 5 AM day of procedure. Contrast allergy: no  Hold: Metformin-day of procedure and 48 hours post procedure. Januvia-AM of procedure. Glimepiride-AM of procedure.  Except hold medications AM meds can be  taken pre-cath with sip of water including: ASA   Confirmed patient has responsible person to drive home post procedure and for 24 hours after you arrive home: yes  I spoke with patient's wife, Mariann Laster [facility]), reviewed instructions.

## 2018-08-23 NOTE — H&P (Addendum)
All Collapse All   ADDENDUM 08/23/2018: PM  Has h/o CAD. Has symptoms c/w angina. Initial approach was coronary CTA with morphology and FFR. Encouraged to do [facility] by Dr. Meda Coffee due to difficulty interpreting Cor CTA if prior stents. Upon discussion with patient, unable to walk on treadmill due to back. Refused pharmacologic study due to prior experience with adenosine. Therefore, given symptoms, decided to precede to coronary angiography as most straight forward way to assess coronary patency and symptoms.  Exam unchanged from prior noted below.  The patient was counseled to undergo left heart catheterization, coronary angiography, and possible percutaneous coronary intervention with stent implantation. The procedural risks and benefits were discussed in detail. The risks discussed included death, stroke, myocardial infarction, life-threatening bleeding, limb ischemia, kidney injury, allergy, and possible emergency cardiac surgery. The risk of these significant complications were estimated to occur less than 1% of the time. After discussion, the patient has agreed to proceed.      Cardiology Office Note:    Date:  07/13/2018   ID:  Justin Key, DOB 1951/03/05, MRN [id number]  PCP:  Justin Lass, MD             Cardiologist:  Justin Grooms, MD   Referring MD: Justin Lass, MD      Chief Complaint  Patient presents with  . Coronary Artery Disease    History of Present Illness:    Justin Key is a 67 y.o. male with a hx of CAD with prior myocardial infarction 1997 treated with angioplasty.  Long-standing history of type 2 diabetes, hypertension, chronic pain syndrome related to lumbar disc disease, and hyperlipidemia.  He has not been seen as a patient in greater than 10 years.  Justin Key has not been seen in quite some time.  He has a long-standing and diabetes mellitus.  No records or present in the electronic health record to document prior  procedures.  My recollection is that he received angioplasty during acute infarction greater than 20 years ago.  He has been relatively stable since that time.  His greatest difficulty is chronic back pain related to multiple lumbar disc surgeries performed over the years.  He has a chronic pain syndrome.  He had pain medication dependence.  He is now off of the pain medications, is not able to sleep well, has lost weight due to gastroparesis and inability to eat, and has poor appetite.  Medicinal THC has been somewhat helpful in pain control and improving diet.  He has had various types of discomfort in the chest.  He clearly has palpitations which frightened him when they occur.  He also complains of discomfort getting worse with taking a deep breath, it is occasionally associated with left arm numbness but not necessarily at the same time.  Episodes of discomfort last minutes and resolved.  There is no associated dyspnea.  He also has left subclavicular discomfort.  He states that he is frightened his heart may stop.  He has not needed to use nitroglycerin.  He is very emotional and at times tearful during the encounter.      Past Medical History:  Diagnosis Date  . Back pain   . Diabetes mellitus without complication ([place])   . Failure to thrive in adult   . GERD (gastroesophageal reflux disease)   . Myocardial infarction ([place]) 1997         Past Surgical History:  Procedure Laterality Date  . BACK SURGERY  microdiskectomy x2  . BIOPSY  06/21/2018   Procedure: BIOPSY;  Surgeon: Wilford Corner, MD;  Location: [facility];  Service: Endoscopy;;  . COLONOSCOPY  07/12/2012   Procedure: COLONOSCOPY;  Surgeon: Lear Ng, MD;  Location: [facility];  Service: Endoscopy;  Laterality: N/A;  . COLONOSCOPY WITH PROPOFOL N/A 06/21/2018   Procedure: COLONOSCOPY WITH PROPOFOL;  Surgeon: Wilford Corner, MD;  Location: [facility];  Service: Endoscopy;  Laterality:  N/A;  . ESOPHAGEAL MANOMETRY N/A 11/15/2017   Procedure: ESOPHAGEAL MANOMETRY (EM);  Surgeon: Wilford Corner, MD;  Location: [facility];  Service: Endoscopy;  Laterality: N/A;  . ESOPHAGOGASTRODUODENOSCOPY  07/12/2012   Procedure: ESOPHAGOGASTRODUODENOSCOPY (EGD);  Surgeon: Lear Ng, MD;  Location: [facility];  Service: Endoscopy;  Laterality: N/A;  . ESOPHAGOGASTRODUODENOSCOPY (EGD) WITH PROPOFOL N/A 06/21/2018   Procedure: ESOPHAGOGASTRODUODENOSCOPY (EGD) WITH PROPOFOL;  Surgeon: Wilford Corner, MD;  Location: [facility];  Service: Endoscopy;  Laterality: N/A;  . KNEE ARTHROSCOPY  1988   Left  . POLYPECTOMY  06/21/2018   Procedure: POLYPECTOMY;  Surgeon: Wilford Corner, MD;  Location: [facility];  Service: Endoscopy;;  . SPINAL CORD STIMULATOR IMPLANT    . SPINAL CORD STIMULATOR REMOVAL    . SPINAL FUSION     x 3    Current Medications: ActiveMedications      Current Meds  Medication Sig  . aspirin 325 MG tablet Take 325 mg by mouth daily.   Marland Kitchen docusate sodium (COLACE) 100 MG capsule Take 100 mg by mouth 2 (two) times daily.  Marland Kitchen glimepiride (AMARYL) 4 MG tablet Take 8 mg by mouth daily with breakfast.   . ibuprofen (ADVIL,MOTRIN) 200 MG tablet Take 800 mg by mouth every 8 (eight) hours as needed (for pain.).  Marland Kitchen JANUVIA 100 MG tablet Take 100 mg daily by mouth.  . lidocaine (LIDODERM) 5 % Place 1 patch onto the skin daily as needed (for back pain.).   Marland Kitchen MELATONIN PO Take 12 mg by mouth at bedtime as needed (for sleep).  . metFORMIN (GLUCOPHAGE) 1000 MG tablet Take 1,000-1,500 mg by mouth See admin instructions. Take 1.5 tablets (1500 mg) by mouth daily in the morning & take 1 tablet (1000 mg) by mouth daily in the evening  . pantoprazole (PROTONIX) 40 MG tablet Take 40 mg by mouth 2 (two) times daily.  . traZODone (DESYREL) 50 MG tablet Take 50-100 mg by mouth at bedtime as needed for sleep.       Allergies:   Humibid la [guaifenesin]    Social History        Socioeconomic History  . Marital status: Married    Spouse name: Not on file  . Number of children: Not on file  . Years of education: Not on file  . Highest education level: Not on file  Occupational History  . Not on file  Social Needs  . Financial resource strain: Not on file  . Food insecurity:    Worry: Not on file    Inability: Not on file  . Transportation needs:    Medical: Not on file    Non-medical: Not on file  Tobacco Use  . Smoking status: Former Smoker    Packs/day: 1.00    Years: 20.00    Pack years: 20.00    Types: Cigarettes    Last attempt to quit: 04/09/2012    Years since quitting: 6.2  . Smokeless tobacco: Never Used  . Tobacco comment: smoking THC for pain/appetite   Substance and Sexual Activity  .  Alcohol use: Yes  . Drug use: No  . Sexual activity: Yes    Birth control/protection: None  Lifestyle  . Physical activity:    Days per week: Not on file    Minutes per session: Not on file  . Stress: Not on file  Relationships  . Social connections:    Talks on phone: Not on file    Gets together: Not on file    Attends religious service: Not on file    Active member of club or organization: Not on file    Attends meetings of clubs or organizations: Not on file    Relationship status: Not on file  Other Topics Concern  . Not on file  Social History Narrative  . Not on file     Family History: The patient's family history is not on file.  ROS:   Please see the history of present illness.    Unexplained weight gain, poor appetite, chills, shortness of breath at times while recumbent, vision disturbance, abdominal discomfort, depression, back pain, rash, difficulty with urination, erectile dysfunction, patient, potation's.  All other systems reviewed and are negative.  EKGs/Labs/Other Studies Reviewed:    The following studies were reviewed today: No recent  cardiovascular evaluation or data.  EKG:  EKG is ordered today.  The ekg ordered today demonstrates sinus rhythm, PACs, biatrial abnormality, vertical axis, inferior T wave abnormality.  When compared to prior EKG from 07/2017, no significant change has occurred.  Recent Labs: 07/29/2017: ALT 21; BUN 20; Creatinine, Ser 0.94; Hemoglobin 14.6; Platelets 297; Potassium 4.0; Sodium 137  Recent Lipid Panel Labs(Brief)           Component Value Date/Time   CHOL  12/18/2008 0315    179        ATP III CLASSIFICATION:  <200     mg/dL   Desirable  200-239  mg/dL   Borderline High  >=240    mg/dL   High          TRIG 64 12/18/2008 0315   HDL 42 12/18/2008 0315   CHOLHDL 4.3 12/18/2008 0315   VLDL 13 12/18/2008 0315   LDLCALC (H) 12/18/2008 0315    124        Total Cholesterol/HDL:CHD Risk Coronary Heart Disease Risk Table                     Men   Women  1/2 Average Risk   3.4   3.3  Average Risk       5.0   4.4  2 X Average Risk   9.6   7.1  3 X Average Risk  23.4   11.0        Use the calculated Patient Ratio above and the CHD Risk Table to determine the patient's CHD Risk.        ATP III CLASSIFICATION (LDL):  <100     mg/dL   Optimal  100-129  mg/dL   Near or Above                    Optimal  130-159  mg/dL   Borderline  160-189  mg/dL   High  >190     mg/dL   Very High      Physical Exam:    VS:  BP 138/82   Pulse 97   Ht 6\' 3"  (1.905 m)   Wt 158 lb 6.4 oz (71.8 kg)   BMI 19.80 kg/m  Wt Readings from Last 3 Encounters:  07/13/18 158 lb 6.4 oz (71.8 kg)  06/21/18 163 lb (73.9 kg)  10/12/17 162 lb 12.8 oz (73.8 kg)     GEN: Has lost significant weight and appears somewhat cachectic compared to baseline.  Well nourished, well developed in no acute distress HEENT: Normal NECK: No JVD. LYMPHATICS: No lymphadenopathy CARDIAC: RRR, no murmur, no gallop, no edema. VASCULAR: 2+ bilateral radial and posterior tibial pulses.  No  bruits. RESPIRATORY:  Clear to auscultation without rales, wheezing or rhonchi  ABDOMEN: Soft, non-tender, non-distended, No pulsatile mass, MUSCULOSKELETAL: No deformity  SKIN: Warm and dry NEUROLOGIC:  Alert and oriented x 3 PSYCHIATRIC:  Normal affect   ASSESSMENT:    1. Other chest pain   2. Type 2 diabetes mellitus with hyperosmolarity without coma, without long-term current use of insulin ([place])   3. Hyperlipidemia LDL goal <70   4. Essential hypertension   5. Chronic pain syndrome   6. Anxiety    PLAN:    In order of problems listed above:  1. The chest pain is atypical.  There are varied components to the discomfort.  Episodes are not exertion related although he is very limited by chronic back pain, stiffness, and difficulty with mobility.  Given the prior history of CAD/myocardial infarction requiring PCI, we will perform a coronary assessment.  He does not want to have a pharmacologic nuclear study because of the impact that the side effects from Lexiscan/adenosine.  He has had 1 of the remote past.  We will therefore perform a coronary CT with morphology and FFR if needed to fully assess coronary patency in this setting.  I do note the presence of "coronary calcification" on a chest CT performed 1 year ago. 2. Hemoglobin A1c target is less than 7 with most recent number of 8.3. 3. LDL target is less than 70.  No recent lipid data is available.  Primary care is at [place] [facility]. 4. Blood pressure is slightly above target of 130/80 mmHg.  Not currently on an antihypertensive.  Should potentially consider intensifying therapy preferably with an angiotensin-converting enzyme inhibitor or ARB given diabetes. 5. Related to chronic severe lumbar disease with chronic pain.  No longer on opiate therapy due to dependence. 6. Associated with severe insomnia.  Chest pain is present but difficult to tell if it is ischemic in nature.  Several component some of  which is surely nonischemic in nature.  Plan coronary CT with morphology to assess coronary patency.  FFR will be used if needed to determine if there is significant stenosis.  He does have coronary calcification.  He refused nuclear imaging.  Symptoms are atypical enough that coronary angiography did not seem appropriate as a first step.  Long discussion concerning risk modification, and in his case there is significant risks imposed by emotional stress pain.  He is working with his primary physician to help better control this.  He is accompanied by his wife.  Secondary risk modification is discussed as outlined above.  Glycemic control could be better.  I do not see any recent lipid data.  This needs to be evaluated.  Blood pressure may also need better control.  He is unable to exercise because of his back.  Greater than 50% of the time during this office visit was spent in education, counseling, and coordination of care related to underlying disease process and testing as outlined.  7.    Medication Adjustments/Labs and Tests Ordered: Current  medicines are reviewed at length with the patient today.  Concerns regarding medicines are outlined above.     Orders Placed This Encounter  Procedures  . CT CORONARY MORPH W/CTA COR W/SCORE W/CA W/CM &/OR WO/CM  . CT CORONARY FRACTIONAL FLOW RESERVE DATA PREP  . CT CORONARY FRACTIONAL FLOW RESERVE FLUID ANALYSIS  . EKG 12-Lead       Meds ordered this encounter  Medications  . metoprolol tartrate (LOPRESSOR) 100 MG tablet    Sig: Take 1 tablet (100 mg total) by mouth as directed.    Dispense:  1 tablet    Refill:  0    Patient Instructions  Your physician recommends that you continue on your current medications as directed. Please refer to the Current Medication list given to you today.   Your physician recommends that you schedule a follow-up appointment in: PENDING CT RESULTS    Please arrive at the [facility]  main entrance of [facility] at xx:xx AM (30-45 minutes prior to test start time)  [facility]: [address] [place], [place] [address] [phone]

## 2018-08-24 ENCOUNTER — Other Ambulatory Visit: Payer: Self-pay

## 2018-08-24 ENCOUNTER — Ambulatory Visit (HOSPITAL_COMMUNITY)
Admission: RE | Admit: 2018-08-24 | Discharge: 2018-08-24 | Disposition: A | Discharge status: Home / Self Care | Payer: 59 | Source: Ambulatory Visit | Attending: Interventional Cardiology | Admitting: Interventional Cardiology

## 2018-08-24 ENCOUNTER — Encounter (HOSPITAL_COMMUNITY)
Admission: RE | Disposition: A | Discharge status: Home / Self Care | Payer: Self-pay | Source: Ambulatory Visit | Attending: Interventional Cardiology

## 2018-08-24 ENCOUNTER — Telehealth: Payer: Self-pay | Admitting: *Deleted

## 2018-08-24 DIAGNOSIS — E1100 Type 2 diabetes mellitus with hyperosmolarity without nonketotic hyperglycemic-hyperosmolar coma (NKHHC): Secondary | ICD-10-CM | POA: Insufficient documentation

## 2018-08-24 DIAGNOSIS — Z87891 Personal history of nicotine dependence: Secondary | ICD-10-CM | POA: Insufficient documentation

## 2018-08-24 DIAGNOSIS — Z681 Body mass index (BMI) 19 or less, adult: Secondary | ICD-10-CM | POA: Insufficient documentation

## 2018-08-24 DIAGNOSIS — E1143 Type 2 diabetes mellitus with diabetic autonomic (poly)neuropathy: Secondary | ICD-10-CM | POA: Insufficient documentation

## 2018-08-24 DIAGNOSIS — G894 Chronic pain syndrome: Secondary | ICD-10-CM | POA: Insufficient documentation

## 2018-08-24 DIAGNOSIS — I25118 Atherosclerotic heart disease of native coronary artery with other forms of angina pectoris: Secondary | ICD-10-CM

## 2018-08-24 DIAGNOSIS — R627 Adult failure to thrive: Secondary | ICD-10-CM | POA: Diagnosis not present

## 2018-08-24 DIAGNOSIS — I2510 Atherosclerotic heart disease of native coronary artery without angina pectoris: Secondary | ICD-10-CM

## 2018-08-24 DIAGNOSIS — Z9889 Other specified postprocedural states: Secondary | ICD-10-CM | POA: Diagnosis not present

## 2018-08-24 DIAGNOSIS — I209 Angina pectoris, unspecified: Secondary | ICD-10-CM

## 2018-08-24 DIAGNOSIS — G4700 Insomnia, unspecified: Secondary | ICD-10-CM | POA: Insufficient documentation

## 2018-08-24 DIAGNOSIS — I10 Essential (primary) hypertension: Secondary | ICD-10-CM | POA: Insufficient documentation

## 2018-08-24 DIAGNOSIS — K219 Gastro-esophageal reflux disease without esophagitis: Secondary | ICD-10-CM | POA: Insufficient documentation

## 2018-08-24 DIAGNOSIS — Z981 Arthrodesis status: Secondary | ICD-10-CM | POA: Diagnosis not present

## 2018-08-24 DIAGNOSIS — Z7984 Long term (current) use of oral hypoglycemic drugs: Secondary | ICD-10-CM | POA: Insufficient documentation

## 2018-08-24 DIAGNOSIS — I252 Old myocardial infarction: Secondary | ICD-10-CM | POA: Diagnosis not present

## 2018-08-24 DIAGNOSIS — Z7982 Long term (current) use of aspirin: Secondary | ICD-10-CM | POA: Insufficient documentation

## 2018-08-24 DIAGNOSIS — K3184 Gastroparesis: Secondary | ICD-10-CM | POA: Diagnosis not present

## 2018-08-24 DIAGNOSIS — Z79899 Other long term (current) drug therapy: Secondary | ICD-10-CM | POA: Insufficient documentation

## 2018-08-24 DIAGNOSIS — E785 Hyperlipidemia, unspecified: Secondary | ICD-10-CM | POA: Diagnosis not present

## 2018-08-24 DIAGNOSIS — Z888 Allergy status to other drugs, medicaments and biological substances status: Secondary | ICD-10-CM | POA: Diagnosis not present

## 2018-08-24 HISTORY — PX: LEFT HEART CATH AND CORONARY ANGIOGRAPHY: CATH118249

## 2018-08-24 HISTORY — PX: INTRAVASCULAR ULTRASOUND/IVUS: CATH118244

## 2018-08-24 LAB — GLUCOSE, CAPILLARY
Glucose-Capillary: 166 mg/dL — ABNORMAL HIGH (ref 70–99)
Glucose-Capillary: 127 mg/dL — ABNORMAL HIGH (ref 70–99)

## 2018-08-24 LAB — POCT ACTIVATED CLOTTING TIME
Activated Clotting Time: 246 seconds
Activated Clotting Time: 263 seconds

## 2018-08-24 SURGERY — LEFT HEART CATH AND CORONARY ANGIOGRAPHY
Anesthesia: LOCAL

## 2018-08-24 MED ORDER — SODIUM CHLORIDE 0.9 % IV SOLN: 250.0000 mL | INTRAVENOUS | Status: DC | PRN

## 2018-08-24 MED ORDER — VERAPAMIL HCL 2.5 MG/ML IV SOLN
INTRAVENOUS | Status: AC
  Filled 2018-08-24: qty 2

## 2018-08-24 MED ORDER — HEPARIN (PORCINE) IN NACL 1000-0.9 UT/500ML-% IV SOLN
INTRAVENOUS | Status: DC | PRN
  Administered 2018-08-24 (×3): 500 mL

## 2018-08-24 MED ORDER — HEPARIN SODIUM (PORCINE) 1000 UNIT/ML IJ SOLN
INTRAMUSCULAR | Status: AC
  Filled 2018-08-24: qty 1

## 2018-08-24 MED ORDER — ASPIRIN 81 MG PO CHEW: 81.0000 mg | CHEWABLE_TABLET | ORAL | Status: DC

## 2018-08-24 MED ORDER — MIDAZOLAM HCL 2 MG/2ML IJ SOLN
INTRAMUSCULAR | Status: AC
  Filled 2018-08-24: qty 2

## 2018-08-24 MED ORDER — SODIUM CHLORIDE 0.9% FLUSH: 3.0000 mL | INTRAVENOUS | Status: DC | PRN

## 2018-08-24 MED ORDER — SODIUM CHLORIDE 0.9% FLUSH: 3.0000 mL | Freq: Two times a day (BID) | INTRAVENOUS | Status: DC

## 2018-08-24 MED ORDER — MIDAZOLAM HCL 2 MG/2ML IJ SOLN
INTRAMUSCULAR | Status: DC | PRN
  Administered 2018-08-24 (×4): 1 mg via INTRAVENOUS

## 2018-08-24 MED ORDER — METOPROLOL SUCCINATE ER 25 MG PO TB24: 25.0000 mg | ORAL_TABLET | Freq: Every day | ORAL | 3 refills | Status: DC

## 2018-08-24 MED ORDER — FENTANYL CITRATE (PF) 100 MCG/2ML IJ SOLN
INTRAMUSCULAR | Status: DC | PRN
  Administered 2018-08-24 (×2): 25 ug via INTRAVENOUS
  Administered 2018-08-24: 50 ug via INTRAVENOUS

## 2018-08-24 MED ORDER — ONDANSETRON HCL 4 MG/2ML IJ SOLN: 4.0000 mg | Freq: Four times a day (QID) | INTRAMUSCULAR | Status: DC | PRN

## 2018-08-24 MED ORDER — IOHEXOL 350 MG/ML SOLN
INTRAVENOUS | Status: DC | PRN
  Administered 2018-08-24: 110 mL via INTRACARDIAC

## 2018-08-24 MED ORDER — LIDOCAINE HCL (PF) 1 % IJ SOLN
INTRAMUSCULAR | Status: AC
  Filled 2018-08-24: qty 30

## 2018-08-24 MED ORDER — SODIUM CHLORIDE 0.9 % IV SOLN: INTRAVENOUS | Status: DC

## 2018-08-24 MED ORDER — SODIUM CHLORIDE 0.9 % WEIGHT BASED INFUSION
1.0000 mL/kg/h | INTRAVENOUS | Status: DC
  Administered 2018-08-24: 1 mL/kg/h via INTRAVENOUS

## 2018-08-24 MED ORDER — LIDOCAINE HCL (PF) 1 % IJ SOLN
INTRAMUSCULAR | Status: DC | PRN
  Administered 2018-08-24: 2 mL

## 2018-08-24 MED ORDER — SODIUM CHLORIDE 0.9 % WEIGHT BASED INFUSION
3.0000 mL/kg/h | INTRAVENOUS | Status: AC
  Administered 2018-08-24: 3 mL/kg/h via INTRAVENOUS

## 2018-08-24 MED ORDER — FENTANYL CITRATE (PF) 100 MCG/2ML IJ SOLN
INTRAMUSCULAR | Status: AC
  Filled 2018-08-24: qty 2

## 2018-08-24 MED ORDER — VERAPAMIL HCL 2.5 MG/ML IV SOLN
INTRAVENOUS | Status: DC | PRN
  Administered 2018-08-24: 10 mL via INTRA_ARTERIAL

## 2018-08-24 MED ORDER — HEPARIN (PORCINE) IN NACL 1000-0.9 UT/500ML-% IV SOLN
INTRAVENOUS | Status: AC
  Filled 2018-08-24: qty 1000

## 2018-08-24 MED ORDER — ACETAMINOPHEN 325 MG PO TABS: 650.0000 mg | ORAL_TABLET | ORAL | Status: DC | PRN

## 2018-08-24 MED ORDER — HEPARIN SODIUM (PORCINE) 1000 UNIT/ML IJ SOLN
INTRAMUSCULAR | Status: DC | PRN
  Administered 2018-08-24: 6000 [IU] via INTRAVENOUS
  Administered 2018-08-24: 3500 [IU] via INTRAVENOUS
  Administered 2018-08-24: 1500 [IU] via INTRAVENOUS

## 2018-08-24 MED FILL — METOPROLOL SUCCINATE ER 25: 25 | 90 days supply | Qty: 90 | Fill #0

## 2018-08-24 SURGICAL SUPPLY — 14 items
CATH 5FR JL3.5 JR4 ANG PIG MP (CATHETERS) ×1 IMPLANT
CATH OPTICROSS HD (CATHETERS) ×1 IMPLANT
CATH VISTA GUIDE 6FR XBLAD3.5 (CATHETERS) ×1 IMPLANT
DEVICE RAD COMP TR BAND LRG (VASCULAR PRODUCTS) ×1 IMPLANT
GLIDESHEATH SLEND A-KIT 6F 22G (SHEATH) ×1 IMPLANT
GUIDEWIRE INQWIRE 1.5J.035X260 (WIRE) IMPLANT
INQWIRE 1.5J .035X260CM (WIRE) ×2
KIT ESSENTIALS PG (KITS) ×1 IMPLANT
KIT HEART LEFT (KITS) ×2 IMPLANT
PACK CARDIAC CATHETERIZATION (CUSTOM PROCEDURE TRAY) ×2 IMPLANT
SLED PULL BACK IVUS (MISCELLANEOUS) ×1 IMPLANT
TRANSDUCER W/STOPCOCK (MISCELLANEOUS) ×2 IMPLANT
TUBING CIL FLEX 10 FLL-RA (TUBING) ×2 IMPLANT
WIRE MINAMO 190 (WIRE) ×1 IMPLANT

## 2018-08-24 NOTE — Telephone Encounter (Signed)
-----   Message from Belva Crome, MD sent at 08/24/2018 11:40 AM EST ----- Regarding: Start metoprolol succinate Please send in a prescription for metoprolol succinate 25 mg/day Send in prescription for sublingual nitroglycerin.  Office follow-up 6 to 8 weeks.

## 2018-08-24 NOTE — Telephone Encounter (Signed)
Per staff message below from Dr. Tamala Julian, Called pt. Left a message for pt to call back. When pt calls, please make him aware that his Metoprolol Succinate 25 mg daily has been sent to his local pharmacy, [facility], and that his f/u appt with Dr. Tamala Julian has been made for 10/18/18 @ 11:40.

## 2018-08-24 NOTE — CV Procedure (Signed)
   Coronary angio and intravascular ultrasound of ostial left main performed via right radial approach.  Ultrasound guidance for access.  Diffusely diseased, moderately severe RCA with up to 70% stenosis in different spots throughout the proximal mid and distal vessel.  Ostial left main that with diagnostic catheter appeared to be approximately 50%.  Intravascular ultrasound demonstrated an area of 9.97 mm.  LAD contains no significant stenosis.  The LAD stopped short of the left ventricular apex.  Circumflex is dominant.  Mid circumflex contains 50 to 60% eccentric narrowing just distal to the origin of the second obtuse marginal.  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  TIMI grade III flow is noted.  The smaller first obtuse marginal contains 90% obstruction as well.  Large inferolateral obtuse marginal branches are widely patent.  RCA is a site of angioplasty in 1997 and contains moderately severe to severe diffuse disease from proximal to distal with a relatively large PDA also containing significant obstruction.  Severe hypokinesis inferior wall including involvement of the inferoapical segment.  Normal LVEDP  Intravascular ultrasound as noted above was performed and demonstrated an ostial left main area of 9.97 mm.  No immediate complications.  ACT postprocedure is pending.  Total of 10,500 units of heparin was given.  Plan discharge later today

## 2018-08-24 NOTE — Discharge Instructions (Signed)
HOLD METFORMIN 58 HOURS AFTER CATH DRINK PLENTY OF FLUIDS AND KEEP RIGHT ARM ELEVATED  Radial Site Care Refer to this sheet in the next few weeks. These instructions provide you with information about caring for yourself after your procedure. Your health care provider may also give you more specific instructions. Your treatment has been planned according to current medical practices, but problems sometimes occur. Call your health care provider if you have any problems or questions after your procedure. What can I expect after the procedure? After your procedure, it is typical to have the following:  Bruising at the radial site that usually fades within 1-2 weeks.  Blood collecting in the tissue (hematoma) that may be painful to the touch. It should usually decrease in size and tenderness within 1-2 weeks.  Follow these instructions at home:  Take medicines only as directed by your health care provider.  You may shower 24-48 hours after the procedure or as directed by your health care provider. Remove the bandage (dressing) and gently wash the site with plain soap and water. Pat the area dry with a clean towel. Do not rub the site, because this may cause bleeding.  Do not take baths, swim, or use a hot tub until your health care provider approves.  Check your insertion site every day for redness, swelling, or drainage.  Do not apply powder or lotion to the site.  Do not flex or bend the affected arm for 24 hours or as directed by your health care provider.  Do not push or pull heavy objects with the affected arm for 24 hours or as directed by your health care provider.  Do not lift over 10 lb (4.5 kg) for 5 days after your procedure or as directed by your health care provider.  Ask your health care provider when it is okay to: ? Return to work or school. ? Resume usual physical activities or sports. ? Resume sexual activity.  Do not drive home if you are discharged the same day as  the procedure. Have someone else drive you.  You may drive 24 hours after the procedure unless otherwise instructed by your health care provider.  Do not operate machinery or power tools for 24 hours after the procedure.  If your procedure was done as an outpatient procedure, which means that you went home the same day as your procedure, a responsible adult should be with you for the first 24 hours after you arrive home.  Keep all follow-up visits as directed by your health care provider. This is important. Contact a health care provider if:  You have a fever.  You have chills.  You have increased bleeding from the radial site. Hold pressure on the site. Get help right away if:  You have unusual pain at the radial site.  You have redness, warmth, or swelling at the radial site.  You have drainage (other than a small amount of blood on the dressing) from the radial site.  The radial site is bleeding, and the bleeding does not stop after 30 minutes of holding steady pressure on the site.  Your arm or hand becomes pale, cool, tingly, or numb. This information is not intended to replace advice given to you by your health care provider. Make sure you discuss any questions you have with your health care provider. Document Released: 10/10/2010 Document Revised: 02/13/2016 Document Reviewed: 03/26/2014 Elsevier Interactive Patient Education  2018 [employer].

## 2018-08-25 ENCOUNTER — Encounter (HOSPITAL_COMMUNITY): Payer: Self-pay | Admitting: Interventional Cardiology

## 2018-08-30 MED FILL — GLIMEPIRIDE 4 MG TABLET: 4 | 90 days supply | Qty: 180 | Fill #0

## 2018-08-30 MED FILL — metFORMIN HCL ER 500 MG TB2: 500 | 30 days supply | Qty: 60 | Fill #1

## 2018-09-05 MED FILL — KETOCONAZOLE 2% CREAM: 2 | 15 days supply | Qty: 30 | Fill #1

## 2018-09-20 DIAGNOSIS — J069 Acute upper respiratory infection, unspecified: Secondary | ICD-10-CM | POA: Diagnosis not present

## 2018-09-21 DIAGNOSIS — M4714 Other spondylosis with myelopathy, thoracic region: Secondary | ICD-10-CM

## 2018-09-21 HISTORY — DX: Other spondylosis with myelopathy, thoracic region: M47.14

## 2018-09-28 MED FILL — JANUVIA 100 MG TABLET: 100 | 30 days supply | Qty: 30 | Fill #3

## 2018-10-10 MED FILL — metFORMIN HCL ER 500 MG TB2: 500 | 30 days supply | Qty: 60 | Fill #2

## 2018-10-11 DIAGNOSIS — R630 Anorexia: Secondary | ICD-10-CM | POA: Diagnosis not present

## 2018-10-11 DIAGNOSIS — R198 Other specified symptoms and signs involving the digestive system and abdomen: Secondary | ICD-10-CM | POA: Diagnosis not present

## 2018-10-14 ENCOUNTER — Other Ambulatory Visit (HOSPITAL_COMMUNITY): Payer: Self-pay | Admitting: Family Medicine

## 2018-10-14 DIAGNOSIS — R109 Unspecified abdominal pain: Secondary | ICD-10-CM

## 2018-10-14 DIAGNOSIS — R630 Anorexia: Secondary | ICD-10-CM

## 2018-10-17 NOTE — Progress Notes (Signed)
Cardiology Office Note:    Date:  10/18/2018   ID:  Justin Key, DOB [birth year], MRN [id number]  PCP:  Kathyrn Lass, MD  Cardiologist:  Sinclair Grooms, MD   Referring MD: Kathyrn Lass, MD   Chief Complaint  Patient presents with  . Coronary Artery Disease    History of Present Illness:    Justin Key is a 68 y.o. male with a hx of  CAD with prior myocardial infarction 1997 treated with angioplasty.  Long-standing history of type 2 diabetes, hypertension, chronic pain syndrome related to lumbar disc disease, and hyperlipidemia. Recent cath with significant CAD treated medically.  Justin Key underwent coronary angiography in early December.  He was having atypical but worrisome chest pain symptoms.  He is diabetic, has had previous infarction and stenting, diabetic, and has untreated hyperlipidemia.  Angiography demonstrated moderate two-vessel coronary disease with 35% ostial left main, 90% small first obtuse marginal, severe second obtuse marginal and moderately severe mid RCA.  LAD was widely patent.  Medical therapy was decided upon.  He is losing weight.  He feels he has developed an eating disorder.  Food is not palatable.  His favorite food at this time is chitterlings.  He is not on lipid therapy because of a prior experience with muscle pain in association with treatment greater than 20 years ago.  He is no longer smoking.  He has a chronic pain syndrome related to lumbosacral spine disease.  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  Diabetes is poorly controlled.  Past Medical History:  Diagnosis Date  . Back pain   . Diabetes mellitus without complication ([place])   . Failure to thrive in adult   . GERD (gastroesophageal reflux disease)   . Myocardial infarction ([place]) 1997    Past Surgical History:  Procedure Laterality Date  . BACK SURGERY     microdiskectomy x2  . BIOPSY  06/21/2018   Procedure: BIOPSY;  Surgeon: Wilford Corner, MD;   [facility];  Service: Endoscopy;;  . COLONOSCOPY  07/12/2012   Procedure: COLONOSCOPY;  Surgeon: Lear Ng, MD;  [facility];  Service: Endoscopy;  Laterality: N/A;  . COLONOSCOPY WITH PROPOFOL N/A 06/21/2018   Procedure: COLONOSCOPY WITH PROPOFOL;  Surgeon: Wilford Corner, MD;  [facility];  Service: Endoscopy;  Laterality: N/A;  . ESOPHAGEAL MANOMETRY N/A 11/15/2017   Procedure: ESOPHAGEAL MANOMETRY (EM);  Surgeon: Wilford Corner, MD;  [facility];  Service: Endoscopy;  Laterality: N/A;  . ESOPHAGOGASTRODUODENOSCOPY  07/12/2012   Procedure: ESOPHAGOGASTRODUODENOSCOPY (EGD);  Surgeon: Lear Ng, MD;  Location: [facility];  Service: Endoscopy;  Laterality: N/A;  . ESOPHAGOGASTRODUODENOSCOPY (EGD) WITH PROPOFOL N/A 06/21/2018   Procedure: ESOPHAGOGASTRODUODENOSCOPY (EGD) WITH PROPOFOL;  Surgeon: Wilford Corner, MD;  [facility];  Service: Endoscopy;  Laterality: N/A;  . INTRAVASCULAR ULTRASOUND/IVUS N/A 08/24/2018   Procedure: Intravascular Ultrasound/IVUS;  Surgeon: Belva Crome, MD;  Location: [place] [facility];  Service: Cardiovascular;  Laterality: N/A;  . KNEE ARTHROSCOPY  1988   Left  . LEFT HEART CATH AND CORONARY ANGIOGRAPHY N/A 08/24/2018   Procedure: LEFT HEART CATH AND CORONARY ANGIOGRAPHY;  Surgeon: Belva Crome, MD;  Location: [place] [facility];  Service: Cardiovascular;  Laterality: N/A;  . POLYPECTOMY  06/21/2018   Procedure: POLYPECTOMY;  Surgeon: Wilford Corner, MD;  [facility];  Service: Endoscopy;;  . SPINAL CORD STIMULATOR IMPLANT    . SPINAL CORD STIMULATOR REMOVAL    .  SPINAL FUSION     x 3    Current Medications: Current Meds  Medication Sig  . docusate sodium (COLACE) 100 MG capsule Take 100 mg by mouth 2 (two) times daily.  Marland Kitchen glimepiride (AMARYL) 4 MG tablet Take 8 mg by mouth daily with breakfast.   . ibuprofen (ADVIL,MOTRIN) 200 MG tablet Take 800 mg by mouth  every 8 (eight) hours as needed (for pain.).  Marland Kitchen JANUVIA 100 MG tablet Take 100 mg daily by mouth.  . lidocaine (LIDODERM) 5 % Place 1 patch onto the skin daily as needed (for back pain.).   Marland Kitchen MELATONIN PO Take 12 mg by mouth at bedtime as needed (for sleep).  . metFORMIN (GLUCOPHAGE-XR) 500 MG 24 hr tablet Take 500 mg by mouth 2 (two) times daily.  . metoprolol succinate (TOPROL-XL) 25 MG 24 hr tablet Take 1 tablet (25 mg total) by mouth daily.  . pantoprazole (PROTONIX) 40 MG tablet Take 40 mg by mouth 2 (two) times daily.  . traZODone (DESYREL) 50 MG tablet Take 50-100 mg by mouth at bedtime as needed for sleep.  . [DISCONTINUED] aspirin 325 MG tablet Take 325 mg by mouth daily.      Allergies:   Humibid la [guaifenesin]   Social History   Socioeconomic History  . Marital status: Married    Spouse name: Not on file  . Number of children: Not on file  . Years of education: Not on file  . Highest education level: Not on file  Occupational History  . Not on file  Social Needs  . Financial resource strain: Not on file  . Food insecurity:    Worry: Not on file    Inability: Not on file  . Transportation needs:    Medical: Not on file    Non-medical: Not on file  Tobacco Use  . Smoking status: Former Smoker    Packs/day: 1.00    Years: 20.00    Pack years: 20.00    Types: Cigarettes    Last attempt to quit: 04/09/2012    Years since quitting: 6.5  . Smokeless tobacco: Never Used  . Tobacco comment: smoking THC for pain/appetite   Substance and Sexual Activity  . Alcohol use: Yes  . Drug use: No  . Sexual activity: Yes    Birth control/protection: None  Lifestyle  . Physical activity:    Days per week: Not on file    Minutes per session: Not on file  . Stress: Not on file  Relationships  . Social connections:    Talks on phone: Not on file    Gets together: Not on file    Attends religious service: Not on file    Active member of club or organization: Not on file     Attends meetings of clubs or organizations: Not on file    Relationship status: Not on file  Other Topics Concern  . Not on file  Social History Narrative  . Not on file     Family History: The patient's family history is not on file.  ROS:   Please see the history of present illness.    Abdominal pain, decreased appetite, depression, chronic back pain, rash, dizziness, difficulty with balance and ambulation, headache, nausea, vomiting, constipation, and weight loss.  All other systems reviewed and are negative.  EKGs/Labs/Other Studies Reviewed:    The following studies were reviewed today:  CORONARY ANGIOGRAPHY 08/24/2018:  Diagnostic  Dominance: Right     Moderately severe two-vessel coronary  artery disease.  30 to 40% ostial left main with minimal cross-sectional area 9.97 mm (significant less than 6 mm).  Widely patent left anterior descending and diagonal.  LAD stops of the left ventricular apex.  The circumflex is a large vessel that gives origin to 3 obtuse marginal branches.  The first marginal is tiny, with ostial 90% narrowing.  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  The larger third obtuse marginal is widely patent.  The mid circumflex beyond the second obtuse marginal contains eccentric 60% disease.  The right coronary contains moderately severe diffuse disease from the proximal to distal segment and also involving the PDA.  Left ventriculography reveals severe inferior and apical hypokinesis.  EF 40 to 45%.  Normal LVEDP  RECOMMENDATIONS:   Given relatively diffuse coronary disease and stable/chronic coronary presentation, appropriate management includes secondary risk prevention with LDL lowering and aggressive management of diabetes.  Consider SGLT2 therapy for diabetes in addition to agents already being used.  Institute anti-ischemic therapy, will start low-dose  beta-blocker therapy in the form of metoprolol succinate 25 mg/day and consider adding either long-acting nitrates or Ranexa if symptoms are not controlled.  Detailed history reveals as some of the patient's chest discomfort is likely non-ischemic related with much of the pain being sharp stabbing quality discomfort.  EKG:  EKG none.  Recent Labs: 08/17/2018: BUN 16; Creatinine, Ser 0.82; Hemoglobin 13.5; Platelets 246; Potassium 4.1; Sodium 139  Recent Lipid Panel    Component Value Date/Time   CHOL  12/18/2008 0315    179        ATP III CLASSIFICATION:  <200     mg/dL   Desirable  200-239  mg/dL   Borderline High  >=240    mg/dL   High          TRIG 64 12/18/2008 0315   HDL 42 12/18/2008 0315   CHOLHDL 4.3 12/18/2008 0315   VLDL 13 12/18/2008 0315   LDLCALC (H) 12/18/2008 0315    124        Total Cholesterol/HDL:CHD Risk Coronary Heart Disease Risk Table                     Men   Women  1/2 Average Risk   3.4   3.3  Average Risk       5.0   4.4  2 X Average Risk   9.6   7.1  3 X Average Risk  23.4   11.0        Use the calculated Patient Ratio above and the CHD Risk Table to determine the patient's CHD Risk.        ATP III CLASSIFICATION (LDL):  <100     mg/dL   Optimal  100-129  mg/dL   Near or Above                    Optimal  130-159  mg/dL   Borderline  160-189  mg/dL   High  >190     mg/dL   Very High    Physical Exam:    VS:  BP 106/64   Pulse 91   Ht 6\' 3"  (1.905 m)   Wt 157 lb 12.8 oz (71.6 kg)   SpO2 97%   BMI 19.72 kg/m     Wt Readings from Last 3 Encounters:  10/18/18 157 lb 12.8 oz (71.6 kg)  08/24/18 158 lb (71.7 kg)  07/13/18 158 lb 6.4 oz (71.8 kg)     GEN: Cachectic/emaciated appearing. No acute distress HEENT: Normal NECK: No JVD. LYMPHATICS: No lymphadenopathy CARDIAC: RRR.  No murmur, no gallop, no edema VASCULAR: 2+ radial pulses, no bruits RESPIRATORY:  Clear to auscultation without rales, wheezing or rhonchi  ABDOMEN: Soft,  non-tender, non-distended, No pulsatile mass, MUSCULOSKELETAL: No deformity  SKIN: Warm and dry NEUROLOGIC:  Alert and oriented x 3 PSYCHIATRIC:  Normal affect   ASSESSMENT:    1. Coronary artery disease of native artery of native heart with stable angina pectoris ([place])   2. Essential hypertension   3. Type 2 diabetes mellitus with hyperosmolarity without coma, without long-term current use of insulin ([place])   4. Hyperlipidemia LDL goal <70    PLAN:    In order of problems listed above:  1. Moderate two-vessel coronary disease involving circumflex and RCA.  Symptoms are atypical.  Not using nitroglycerin.  Symptoms can last hours, and mostly are related to left scapula and shoulder discomfort.  He is physically and active. 2. Target less than 130/80.  Current blood pressure is well within that range. 3. Last hemoglobin A1c was greater than 8 in June 2019. 4. No LDL is present.  He feels that we do not need to monitor it because he will refuse therapy.  Says it if he believes the medication will not help him he is not going to take it.  Made him aware of secondary risk prevention.  Encourage physical activity as much as possible.  Targets for secondary prevention were reviewed in detail: Overall education and awareness concerning primary/secondary risk prevention was discussed in detail: LDL less than 70, hemoglobin A1c less than 7, blood pressure target less than 130/80 mmHg, >150 minutes of moderate aerobic activity per week, avoidance of smoking, weight control (via diet and exercise), and continued surveillance/management of/for obstructive sleep apnea.  Prescription for sublingual nitroglycerin is given.  Clinical follow-up in 1 year.  Earlier if change   Medication Adjustments/Labs and Tests Ordered: Current medicines are reviewed at length with the patient today.  Concerns regarding medicines are outlined above.  No orders of the defined types were placed in this  encounter.  Meds ordered this encounter  Medications  . nitroGLYCERIN (NITROSTAT) 0.4 MG SL tablet    Sig: Place 1 tablet (0.4 mg total) under the tongue every 5 (five) minutes as needed for chest pain.    Dispense:  25 tablet    Refill:  3  . aspirin EC 81 MG tablet    Sig: Take 1 tablet (81 mg total) by mouth daily.    Dispense:  90 tablet    Refill:  3    Patient Instructions  Medication Instructions:  1) DECREASE Aspirin to 81mg  once daily 2) A prescription has been sent in for Nitroglycerin.  If you have chest pain that doesn't relieve quickly, place one tablet under your tongue and allow it to dissolve.  If no relief after 5 minutes, you may take another pill.  If no relief after 5 minutes, you may take a 3rd dose but you need to call 911 and report to ER immediately.  If you need a refill on your cardiac medications before your next appointment, please call your pharmacy.   Lab work: None If you have labs (blood work) drawn today and your tests are completely normal, you will receive your results only by: Marland Kitchen MyChart Message (if you have MyChart) OR .  A paper copy in the mail If you have any lab test that is abnormal or we need to change your treatment, we will call you to review the results.  Testing/Procedures: None  Follow-Up: At [facility], you and your health needs are our priority.  As part of our continuing mission to provide you with exceptional heart care, we have created designated Provider Care Teams.  These Care Teams include your primary Cardiologist (physician) and Advanced Practice Providers (APPs -  Physician Assistants and Nurse Practitioners) who all work together to provide you with the care you need, when you need it. You will need a follow up appointment in 12 months.  Please call our office 2 months in advance to schedule this appointment.  You may see Sinclair Grooms, MD or one of the following Advanced Practice Providers on your designated Care  Team:   Truitt Merle, NP Cecilie Kicks, NP . Kathyrn Drown, NP  Any Other Special Instructions Will Be Listed Below (If Applicable).       Signed, Sinclair Grooms, MD  10/18/2018 12:25 PM    Justin Key

## 2018-10-18 ENCOUNTER — Ambulatory Visit (INDEPENDENT_AMBULATORY_CARE_PROVIDER_SITE_OTHER): Payer: 59 | Admitting: Interventional Cardiology

## 2018-10-18 ENCOUNTER — Encounter: Payer: Self-pay | Admitting: Interventional Cardiology

## 2018-10-18 VITALS — BP 106/64 | HR 91 | Ht 75.0 in | Wt 157.8 lb

## 2018-10-18 DIAGNOSIS — I10 Essential (primary) hypertension: Secondary | ICD-10-CM | POA: Diagnosis not present

## 2018-10-18 DIAGNOSIS — I25118 Atherosclerotic heart disease of native coronary artery with other forms of angina pectoris: Secondary | ICD-10-CM | POA: Diagnosis not present

## 2018-10-18 DIAGNOSIS — E785 Hyperlipidemia, unspecified: Secondary | ICD-10-CM

## 2018-10-18 DIAGNOSIS — E1100 Type 2 diabetes mellitus with hyperosmolarity without nonketotic hyperglycemic-hyperosmolar coma (NKHHC): Secondary | ICD-10-CM

## 2018-10-18 MED ORDER — ASPIRIN EC 81 MG PO TBEC: 81.0000 mg | DELAYED_RELEASE_TABLET | Freq: Every day | ORAL | 3 refills | Status: DC

## 2018-10-18 MED ORDER — NITROGLYCERIN 0.4 MG SL SUBL: 0.4000 mg | SUBLINGUAL_TABLET | SUBLINGUAL | 3 refills | Status: DC | PRN

## 2018-10-18 MED FILL — NITROGLYCERIN 0.4 MG TAB SL: 0.4 | 13 days supply | Qty: 25 | Fill #0

## 2018-10-18 NOTE — Patient Instructions (Signed)
Medication Instructions:  1) DECREASE Aspirin to 81mg  once daily 2) A prescription has been sent in for Nitroglycerin.  If you have chest pain that doesn't relieve quickly, place one tablet under your tongue and allow it to dissolve.  If no relief after 5 minutes, you may take another pill.  If no relief after 5 minutes, you may take a 3rd dose but you need to call 911 and report to ER immediately.  If you need a refill on your cardiac medications before your next appointment, please call your pharmacy.   Lab work: None If you have labs (blood work) drawn today and your tests are completely normal, you will receive your results only by: Marland Kitchen MyChart Message (if you have MyChart) OR . A paper copy in the mail If you have any lab test that is abnormal or we need to change your treatment, we will call you to review the results.  Testing/Procedures: None  Follow-Up: At [facility], you and your health needs are our priority.  As part of our continuing mission to provide you with exceptional heart care, we have created designated Provider Care Teams.  These Care Teams include your primary Cardiologist (physician) and Advanced Practice Providers (APPs -  Physician Assistants and Nurse Practitioners) who all work together to provide you with the care you need, when you need it. You will need a follow up appointment in 12 months.  Please call our office 2 months in advance to schedule this appointment.  You may see Sinclair Grooms, MD or one of the following Advanced Practice Providers on your designated Care Team:   Truitt Merle, NP Cecilie Kicks, NP . Kathyrn Drown, NP  Any Other Special Instructions Will Be Listed Below (If Applicable).

## 2018-10-20 MED FILL — traZODone HCL 50 MG TABS: 50 | 30 days supply | Qty: 60 | Fill #4

## 2018-10-24 DIAGNOSIS — R102 Pelvic and perineal pain: Secondary | ICD-10-CM | POA: Diagnosis not present

## 2018-10-24 DIAGNOSIS — N401 Enlarged prostate with lower urinary tract symptoms: Secondary | ICD-10-CM | POA: Diagnosis not present

## 2018-10-24 DIAGNOSIS — N3941 Urge incontinence: Secondary | ICD-10-CM | POA: Diagnosis not present

## 2018-10-26 ENCOUNTER — Encounter (HOSPITAL_COMMUNITY): Payer: Self-pay

## 2018-10-26 ENCOUNTER — Ambulatory Visit (HOSPITAL_COMMUNITY)
Admission: RE | Admit: 2018-10-26 | Discharge: 2018-10-26 | Disposition: A | Discharge status: Home / Self Care | Payer: Commercial Managed Care - PPO | Source: Ambulatory Visit | Attending: Family Medicine | Admitting: Family Medicine

## 2018-10-26 DIAGNOSIS — R109 Unspecified abdominal pain: Secondary | ICD-10-CM | POA: Insufficient documentation

## 2018-10-26 DIAGNOSIS — R110 Nausea: Secondary | ICD-10-CM | POA: Diagnosis not present

## 2018-10-26 DIAGNOSIS — R630 Anorexia: Secondary | ICD-10-CM | POA: Insufficient documentation

## 2018-10-26 MED ORDER — TECHNETIUM TC 99M SULFUR COLLOID
2.0000 | Freq: Once | INTRAVENOUS | Status: AC | PRN
  Administered 2018-10-26: 2 via ORAL

## 2018-10-26 MED FILL — KETOCONAZOLE 2% CREAM: 2 | 15 days supply | Qty: 30 | Fill #2 | Status: TO

## 2018-10-27 ENCOUNTER — Telehealth: Payer: Self-pay

## 2018-10-27 NOTE — Telephone Encounter (Signed)
Sent referral to scheduling and filed notes 

## 2018-11-14 DIAGNOSIS — B356 Tinea cruris: Secondary | ICD-10-CM | POA: Diagnosis not present

## 2018-11-14 DIAGNOSIS — B351 Tinea unguium: Secondary | ICD-10-CM | POA: Diagnosis not present

## 2018-11-14 DIAGNOSIS — B352 Tinea manuum: Secondary | ICD-10-CM | POA: Diagnosis not present

## 2018-11-14 DIAGNOSIS — L300 Nummular dermatitis: Secondary | ICD-10-CM | POA: Diagnosis not present

## 2018-11-14 MED FILL — TERBINAFINE HCL 250 MG TAB: 250 | 90 days supply | Qty: 90 | Fill #0

## 2018-11-15 MED FILL — JANUVIA 100 MG TABLET: 100 | 30 days supply | Qty: 30 | Fill #4 | Status: TO

## 2018-11-17 MED FILL — metFORMIN HCL ER 500 MG TB2: 500 | 30 days supply | Qty: 60 | Fill #3 | Status: TO

## 2018-11-17 MED FILL — METOPROLOL SUCCINATE ER 25: 25 | 90 days supply | Qty: 90 | Fill #1

## 2018-11-17 MED FILL — GLIMEPIRIDE 4 MG TABLET: 4 | 90 days supply | Qty: 180 | Fill #1

## 2018-11-17 MED FILL — traZODone HCL 50 MG TABS: 50 | 30 days supply | Qty: 60 | Fill #5

## 2018-12-13 DIAGNOSIS — E119 Type 2 diabetes mellitus without complications: Secondary | ICD-10-CM | POA: Diagnosis not present

## 2018-12-13 DIAGNOSIS — H524 Presbyopia: Secondary | ICD-10-CM | POA: Diagnosis not present

## 2018-12-20 MED FILL — JANUVIA 100 MG TABLET: 100 | 30 days supply | Qty: 30 | Fill #0

## 2018-12-20 MED FILL — KETOCONAZOLE 2% CREAM: 2 | 15 days supply | Qty: 30 | Fill #0

## 2018-12-20 MED FILL — metFORMIN HCL ER 500 MG TB2: 500 | 30 days supply | Qty: 60 | Fill #0

## 2018-12-23 MED FILL — traZODone HCL 50 MG TABS: 50 | 30 days supply | Qty: 60 | Fill #0

## 2018-12-27 DIAGNOSIS — M4804 Spinal stenosis, thoracic region: Secondary | ICD-10-CM | POA: Insufficient documentation

## 2019-01-02 MED FILL — diazePAM 5 MG TABS: 5 | 1 days supply | Qty: 4 | Fill #0

## 2019-01-10 MED FILL — diazePAM 5 MG TABS: 5 | 4 days supply | Qty: 4 | Fill #0

## 2019-01-12 ENCOUNTER — Other Ambulatory Visit: Payer: Self-pay | Admitting: Gastroenterology

## 2019-01-12 DIAGNOSIS — R1011 Right upper quadrant pain: Secondary | ICD-10-CM

## 2019-01-12 DIAGNOSIS — R194 Change in bowel habit: Secondary | ICD-10-CM

## 2019-01-12 DIAGNOSIS — R1084 Generalized abdominal pain: Secondary | ICD-10-CM

## 2019-01-13 DIAGNOSIS — R1011 Right upper quadrant pain: Secondary | ICD-10-CM | POA: Diagnosis not present

## 2019-01-17 ENCOUNTER — Other Ambulatory Visit: Payer: Commercial Managed Care - PPO

## 2019-01-27 DIAGNOSIS — H0014 Chalazion left upper eyelid: Secondary | ICD-10-CM | POA: Diagnosis not present

## 2019-02-02 ENCOUNTER — Ambulatory Visit
Admission: RE | Admit: 2019-02-02 | Discharge: 2019-02-02 | Disposition: A | Discharge status: Home / Self Care | Payer: 59 | Source: Ambulatory Visit | Attending: Gastroenterology | Admitting: Gastroenterology

## 2019-02-02 ENCOUNTER — Other Ambulatory Visit: Payer: Self-pay

## 2019-02-02 DIAGNOSIS — R1011 Right upper quadrant pain: Secondary | ICD-10-CM | POA: Diagnosis not present

## 2019-02-02 DIAGNOSIS — R1084 Generalized abdominal pain: Secondary | ICD-10-CM

## 2019-02-02 DIAGNOSIS — R194 Change in bowel habit: Secondary | ICD-10-CM

## 2019-02-02 MED ORDER — IOPAMIDOL (ISOVUE-300) INJECTION 61%
100.0000 mL | Freq: Once | INTRAVENOUS | Status: AC | PRN
  Administered 2019-02-02: 100 mL via INTRAVENOUS

## 2019-02-06 MED FILL — metFORMIN HCL ER 500 MG TB2: 500 | 30 days supply | Qty: 60 | Fill #1

## 2019-02-10 ENCOUNTER — Other Ambulatory Visit: Payer: Self-pay | Admitting: Neurosurgery

## 2019-02-10 MED FILL — traZODone HCL 50 MG TABS: 50 | 30 days supply | Qty: 60 | Fill #0

## 2019-02-16 ENCOUNTER — Encounter (HOSPITAL_COMMUNITY): Payer: Self-pay | Admitting: Emergency Medicine

## 2019-02-16 ENCOUNTER — Ambulatory Visit (HOSPITAL_COMMUNITY)
Admission: EM | Admit: 2019-02-16 | Discharge: 2019-02-16 | Disposition: A | Discharge status: Home / Self Care | Payer: 59 | Attending: Internal Medicine | Admitting: Internal Medicine

## 2019-02-16 ENCOUNTER — Other Ambulatory Visit: Payer: Self-pay

## 2019-02-16 ENCOUNTER — Ambulatory Visit (INDEPENDENT_AMBULATORY_CARE_PROVIDER_SITE_OTHER): Payer: 59

## 2019-02-16 DIAGNOSIS — J209 Acute bronchitis, unspecified: Secondary | ICD-10-CM | POA: Diagnosis not present

## 2019-02-16 DIAGNOSIS — R05 Cough: Secondary | ICD-10-CM | POA: Diagnosis not present

## 2019-02-16 MED ORDER — GUAIFENESIN 100 MG/5ML PO LIQD: 200.0000 mg | Freq: Three times a day (TID) | ORAL | 0 refills | Status: DC | PRN

## 2019-02-16 NOTE — ED Provider Notes (Signed)
[place]    CSN: [id number] Arrival date & time: 02/16/19  1642     History   Chief Complaint Chief Complaint  Patient presents with  . Muscle Pain    HPI FRUTOSO DIMARE is a 68 y.o. male with a history of diabetes mellitus type 2 comes to urgent care with complaints of left-sided chest soreness, cough productive of thick frothy sputum.  Patient denies any fever or chills.  No shortness of breath.  No nausea or vomiting.  No runny nose, sneezing no sore throat.  Patient denies any body aches.  He comes to urgent care to be evaluated for that. Patient denies any wheezing.  He has a history of diabetic gastroparesis and he admits having occasional vomiting but denies any choking or aspiration.  HPI  Past Medical History:  Diagnosis Date  . Back pain   . Diabetes mellitus without complication ([place])   . Failure to thrive in adult   . GERD (gastroesophageal reflux disease)   . Myocardial infarction [facility]) 1997    Patient Active Problem List   Diagnosis Date Noted  . CAD (coronary artery disease), native coronary artery 08/24/2018  . Angina pectoris ([place]) 08/24/2018  . GERD (gastroesophageal reflux disease) 06/21/2018  . Personal history of colonic polyps 06/21/2018  . Pulmonary infiltrate present on computed tomography 10/12/2017    Past Surgical History:  Procedure Laterality Date  . BACK SURGERY     microdiskectomy x2  . BIOPSY  06/21/2018   Procedure: BIOPSY;  Surgeon: Wilford Corner, MD;  [facility];  Service: Endoscopy;;  . COLONOSCOPY  07/12/2012   Procedure: COLONOSCOPY;  Surgeon: Lear Ng, MD;  [facility];  Service: Endoscopy;  Laterality: N/A;  . COLONOSCOPY WITH PROPOFOL N/A 06/21/2018   Procedure: COLONOSCOPY WITH PROPOFOL;  Surgeon: Wilford Corner, MD;  [facility];  Service: Endoscopy;  Laterality: N/A;  . ESOPHAGEAL MANOMETRY N/A 11/15/2017   Procedure: ESOPHAGEAL MANOMETRY (EM);  Surgeon:  Wilford Corner, MD;  [facility];  Service: Endoscopy;  Laterality: N/A;  . ESOPHAGOGASTRODUODENOSCOPY  07/12/2012   Procedure: ESOPHAGOGASTRODUODENOSCOPY (EGD);  Surgeon: Lear Ng, MD;  Location: [facility];  Service: Endoscopy;  Laterality: N/A;  . ESOPHAGOGASTRODUODENOSCOPY (EGD) WITH PROPOFOL N/A 06/21/2018   Procedure: ESOPHAGOGASTRODUODENOSCOPY (EGD) WITH PROPOFOL;  Surgeon: Wilford Corner, MD;  [facility];  Service: Endoscopy;  Laterality: N/A;  . INTRAVASCULAR ULTRASOUND/IVUS N/A 08/24/2018   Procedure: Intravascular Ultrasound/IVUS;  Surgeon: Belva Crome, MD;  Location: [place] [facility];  Service: Cardiovascular;  Laterality: N/A;  . KNEE ARTHROSCOPY  1988   Left  . LEFT HEART CATH AND CORONARY ANGIOGRAPHY N/A 08/24/2018   Procedure: LEFT HEART CATH AND CORONARY ANGIOGRAPHY;  Surgeon: Belva Crome, MD;  Location: [place] [facility];  Service: Cardiovascular;  Laterality: N/A;  . POLYPECTOMY  06/21/2018   Procedure: POLYPECTOMY;  Surgeon: Wilford Corner, MD;  [facility];  Service: Endoscopy;;  . SPINAL CORD STIMULATOR IMPLANT    . SPINAL CORD STIMULATOR REMOVAL    . SPINAL FUSION     x 3       Home Medications    Prior to Admission medications   Medication Sig Start Date End Date Taking? Authorizing Provider  aspirin EC 81 MG tablet Take 1 tablet (81 mg total) by mouth daily. 10/18/18   Belva Crome, MD  docusate sodium (COLACE) 100 MG capsule Take 100 mg by mouth 2 (two) times daily.    [provider]  glimepiride (AMARYL) 4 MG tablet Take 8 mg by mouth daily with breakfast.     [provider]  ibuprofen (ADVIL,MOTRIN) 200 MG tablet Take 800 mg by mouth every 8 (eight) hours as needed (for pain.).    [provider]  JANUVIA 100 MG tablet Take 100 mg daily by mouth. 07/21/17   [provider]  lidocaine (LIDODERM) 5 % Place 1 patch onto the skin daily as needed (for back pain.).   02/13/16   [provider]  MELATONIN PO Take 12 mg by mouth at bedtime as needed (for sleep).    [provider]  metFORMIN (GLUCOPHAGE-XR) 500 MG 24 hr tablet Take 500 mg by mouth 2 (two) times daily. 07/18/18   [provider]  metoprolol succinate (TOPROL-XL) 25 MG 24 hr tablet Take 1 tablet (25 mg total) by mouth daily. 08/24/18   Belva Crome, MD  nitroGLYCERIN (NITROSTAT) 0.4 MG SL tablet Place 1 tablet (0.4 mg total) under the tongue every 5 (five) minutes as needed for chest pain. 10/18/18 01/16/19  Belva Crome, MD  pantoprazole (PROTONIX) 40 MG tablet Take 40 mg by mouth 2 (two) times daily.    [provider]  traZODone (DESYREL) 50 MG tablet Take 50-100 mg by mouth at bedtime as needed for sleep. 06/06/18   [provider]    Family History History reviewed. No pertinent family history.  Social History Social History   Tobacco Use  . Smoking status: Former Smoker    Packs/day: 1.00    Years: 20.00    Pack years: 20.00    Types: Cigarettes    Last attempt to quit: 04/09/2012    Years since quitting: 6.8  . Smokeless tobacco: Never Used  . Tobacco comment: smoking THC for pain/appetite   Substance Use Topics  . Alcohol use: Yes  . Drug use: No     Allergies   Humibid la [guaifenesin]   Review of Systems Review of Systems  Constitutional: Negative for activity change, appetite change, chills, fatigue and fever.  HENT: Negative for congestion, hearing loss, mouth sores, postnasal drip, rhinorrhea, sinus pressure and sore throat.   Respiratory: Negative for cough, chest tightness and wheezing.   Cardiovascular: Negative.   Gastrointestinal: Negative.   Musculoskeletal: Negative for arthralgias, gait problem, myalgias, neck pain and neck stiffness.  Neurological: Negative for dizziness, syncope, numbness and headaches.  All other systems reviewed and are negative.    Physical Exam Triage Vital Signs ED Triage Vitals   Enc Vitals Group     BP 02/16/19 1728 126/83     Pulse Rate 02/16/19 1728 90     Resp 02/16/19 1728 (!) 22     Temp 02/16/19 1728 99.1 F (37.3 C)     Temp Source 02/16/19 1728 Oral     SpO2 02/16/19 1728 100 %     Weight --      Height --      Head Circumference --      Peak Flow --      Pain Score 02/16/19 1725 5     Pain Loc --      Pain Edu? --      Excl. in [place]? --    No data found.  Updated Vital Signs BP 126/83 (BP Location: Right Arm)   Pulse 90   Temp 99.1 F (37.3 C) (Oral)   Resp (!) 22   SpO2 100%   Visual Acuity Right Eye Distance:  Left Eye Distance:   Bilateral Distance:    Right Eye Near:   Left Eye Near:    Bilateral Near:     Physical Exam Constitutional:      General: He is not in acute distress.    Appearance: He is ill-appearing. He is not toxic-appearing.  HENT:     Nose: No rhinorrhea.     Mouth/Throat:     Pharynx: No posterior oropharyngeal erythema.  Cardiovascular:     Rate and Rhythm: Normal rate and regular rhythm.     Pulses: Normal pulses.     Heart sounds: Normal heart sounds.  Pulmonary:     Effort: Pulmonary effort is normal. No respiratory distress.     Breath sounds: Normal breath sounds. No stridor. No wheezing or rhonchi.  Abdominal:     General: Bowel sounds are normal.     Palpations: Abdomen is soft.  Musculoskeletal: Normal range of motion.  Skin:    General: Skin is warm.     Capillary Refill: Capillary refill takes less than 2 seconds.     Coloration: Skin is not jaundiced.     Findings: No bruising or lesion.  Neurological:     General: No focal deficit present.     Mental Status: He is alert and oriented to person, place, and time.      UC Treatments / Results  Labs (all labs ordered are listed, but only abnormal results are displayed) Labs Reviewed - No data to display  EKG None  Radiology No results found.  Procedures Procedures (including critical care time)  Medications Ordered in UC  Medications - No data to display  Initial Impression / Assessment and Plan / UC Course  I have reviewed the triage vital signs and the nursing notes.  Pertinent labs & imaging results that were available during my care of the patient were reviewed by me and considered in my medical decision making (see chart for details).     1.  Acute bronchitis: Mucinex 600 mg twice daily Humibid is documented as an allergy.  Patient says that he had some hallucinations with use of that in the past.  He wants to try it Mucinex again to help with thick secretions. No indication for antibiotics at this time Chest x-ray was independently reviewed by me was negative for any acute lung infiltrate. Final Clinical Impressions(s) / UC Diagnoses   Final diagnoses:  Acute bronchitis, unspecified organism   Discharge Instructions   None    ED Prescriptions    None     Controlled Substance Prescriptions [place] Controlled Substance Registry consulted? No   Chase Picket, MD 02/16/19 [phone]

## 2019-02-16 NOTE — ED Triage Notes (Signed)
Left chest soreness for 2 weeks Thick, frothy sputum States it feels like right lung is clear, left lund has congestion.   Denies fever Denies sob

## 2019-03-02 MED FILL — METOPROLOL SUCCINATE ER 25: 25 | 90 days supply | Qty: 90 | Fill #0

## 2019-03-02 MED FILL — GLIMEPIRIDE 4 MG TABLET: 4 | 90 days supply | Qty: 180 | Fill #0

## 2019-03-02 MED FILL — PANTOPRAZOLE SOD DR 40 MG T: 40 | 90 days supply | Qty: 180 | Fill #0

## 2019-03-02 MED FILL — FREESTYLE LITE TEST STRIP: 90 days supply | Qty: 200 | Fill #0

## 2019-03-08 ENCOUNTER — Other Ambulatory Visit: Payer: Self-pay

## 2019-03-08 ENCOUNTER — Encounter (HOSPITAL_COMMUNITY): Payer: Self-pay

## 2019-03-08 ENCOUNTER — Encounter (HOSPITAL_COMMUNITY)
Admission: RE | Admit: 2019-03-08 | Discharge: 2019-03-08 | Disposition: A | Discharge status: Home / Self Care | Payer: No Typology Code available for payment source | Source: Ambulatory Visit | Attending: Neurosurgery | Admitting: Neurosurgery

## 2019-03-08 DIAGNOSIS — Z01818 Encounter for other preprocedural examination: Secondary | ICD-10-CM | POA: Insufficient documentation

## 2019-03-08 DIAGNOSIS — I498 Other specified cardiac arrhythmias: Secondary | ICD-10-CM | POA: Insufficient documentation

## 2019-03-08 DIAGNOSIS — E119 Type 2 diabetes mellitus without complications: Secondary | ICD-10-CM | POA: Diagnosis not present

## 2019-03-08 DIAGNOSIS — R9431 Abnormal electrocardiogram [ECG] [EKG]: Secondary | ICD-10-CM | POA: Insufficient documentation

## 2019-03-08 DIAGNOSIS — I10 Essential (primary) hypertension: Secondary | ICD-10-CM | POA: Diagnosis not present

## 2019-03-08 HISTORY — DX: Atherosclerotic heart disease of native coronary artery without angina pectoris: I25.10

## 2019-03-08 LAB — BASIC METABOLIC PANEL
Anion gap: 8 (ref 5–15)
BUN: 21 mg/dL (ref 8–23)
CO2: 27 mmol/L (ref 22–32)
Calcium: 9.2 mg/dL (ref 8.9–10.3)
Chloride: 103 mmol/L (ref 98–111)
Creatinine, Ser: 1.08 mg/dL (ref 0.61–1.24)
GFR calc Af Amer: >60 mL/min (ref >60)
GFR calc non Af Amer: >60 mL/min (ref >60)
Glucose, Bld: 249 mg/dL — ABNORMAL HIGH (ref 70–99)
Potassium: 3.9 mmol/L (ref 3.5–5.1)
Sodium: 138 mmol/L (ref 135–145)

## 2019-03-08 LAB — CBC
HCT: 42.5 % (ref 39.0–52.0)
Hemoglobin: 13.9 g/dL (ref 13.0–17.0)
MCH: 29.3 pg (ref 26.0–34.0)
MCHC: 32.7 g/dL (ref 30.0–36.0)
MCV: 89.7 fL (ref 80.0–100.0)
Platelets: 207 10*3/uL (ref 150–400)
RBC: 4.74 MIL/uL (ref 4.22–5.81)
RDW: 13.1 % (ref 11.5–15.5)
WBC: 7.8 10*3/uL (ref 4.0–10.5)
nRBC: 0 % (ref 0.0–0.2)

## 2019-03-08 LAB — HEMOGLOBIN A1C
Hgb A1c MFr Bld: 7.6 % — ABNORMAL HIGH (ref 4.8–5.6)
Mean Plasma Glucose: 171.42 mg/dL

## 2019-03-08 LAB — SURGICAL PCR SCREEN
MRSA, PCR: NEGATIVE
Staphylococcus aureus: NEGATIVE

## 2019-03-08 LAB — GLUCOSE, CAPILLARY: Glucose-Capillary: 315 mg/dL — ABNORMAL HIGH (ref 70–99)

## 2019-03-08 NOTE — Progress Notes (Signed)
PCP - Dr. Kathyrn Lass Cardiologist - Dr. Daneen Schick  Chest x-ray - N/A EKG - 03/08/19 Stress Test - greater than 10 years ago  ECHO - greater than 10 years ago Cardiac Cath - 10/25/17  Sleep Study - denies CPAP -   Fasting Blood Sugar - "150+" Checks Blood Sugar once daily   Aspirin Instructions: Patient instructed to hold all Aspirin, NSAID's, herbal medications, fish oil and vitamins 7 days prior to surgery.   Anesthesia review: cardiac history  Patient denies shortness of breath, fever, cough and chest pain at PAT appointment   Patient verbalized understanding of instructions that were given to them at the PAT appointment. Patient was also instructed that they will need to review over the PAT instructions again at home before surgery.

## 2019-03-08 NOTE — Pre-Procedure Instructions (Addendum)
[place], [place] - [address] [place] [address] Phone: [phone] Fax: [phone]      Your procedure is scheduled on Wednesday June 24th.  Report to [facility] Main Entrance "A" at 5:30 A.M., and check in at the Admitting office.  Call this number if you have problems the morning of surgery:  [phone]  Call [phone] if you have any questions prior to your surgery date Monday-Friday 8am-4pm    Remember:  Do not eat or drink after midnight.     Take these medicines the morning of surgery with A SIP OF WATER  metoprolol succinate (TOPROL-XL) pantoprazole (PROTONIX)  nitroGLYCERIN (NITROSTAT) - if needed   As of today, STOP taking any Aspirin (unless otherwise instructed by your surgeon), Aleve, Naproxen, Ibuprofen, Motrin, Advil, Goody's, BC's, all herbal medications, fish oil, and all vitamins.   HOW TO MANAGE YOUR DIABETES BEFORE AND AFTER SURGERY  Why is it important to control my blood sugar before and after surgery? . Improving blood sugar levels before and after surgery helps healing and can limit problems. . A way of improving blood sugar control is eating a healthy diet by: o  Eating less sugar and carbohydrates o  Increasing activity/exercise o  Talking with your doctor about reaching your blood sugar goals . High blood sugars (greater than 180 mg/dL) can raise your risk of infections and slow your recovery, so you will need to focus on controlling your diabetes during the weeks before surgery. . Make sure that the doctor who takes care of your diabetes knows about your planned surgery including the date and location.  How do I manage my blood sugar before surgery? . Check your blood sugar at least 4 times a day, starting 2 days before surgery, to make sure that the level is not too high or low. o Check your blood sugar the morning of your surgery when you wake up and every 2 hours  until you get to the Short Stay unit. . If your blood sugar is less than 70 mg/dL, you will need to treat for low blood sugar: o Do not take insulin. o Treat a low blood sugar (less than 70 mg/dL) with  cup of clear juice (cranberry or apple), 4 glucose tablets, OR glucose gel. Recheck blood sugar in 15 minutes after treatment (to make sure it is greater than 70 mg/dL). If your blood sugar is not greater than 70 mg/dL on recheck, call [phone] o  for further instructions. . Report your blood sugar to the short stay nurse when you get to Short Stay.  . If you are admitted to the hospital after surgery: o Your blood sugar will be checked by the staff and you will probably be given insulin after surgery (instead of oral diabetes medicines) to make sure you have good blood sugar levels. o The goal for blood sugar control after surgery is 80-180 mg/dL.     WHAT DO I DO ABOUT MY DIABETES MEDICATION?   Marland Kitchen Do not take oral diabetes medicines (pills): JANUVIA, metFORMIN (GLUCOPHAGE-XR), glimepiride (AMARYL) the morning of surgery.   The Morning of Surgery  Do not wear jewelry, make-up or nail polish.  Do not wear lotions, powders, or perfumes/colognes, or deodorant  Do not shave 48 hours prior to surgery.  Men may shave face and neck.  Do not bring valuables to the hospital.  [facility] is not responsible for any belongings or valuables.  If you are a smoker, DO NOT Smoke 24 hours prior to surgery IF you wear a CPAP at night please bring your mask, tubing, and machine the morning of surgery   Remember that you must have someone to transport you home after your surgery, and remain with you for 24 hours if you are discharged the same day.   Contacts, glasses, hearing aids, dentures or bridgework may not be worn into surgery.    Leave your suitcase in the car.  After surgery it may be brought to your room.  For patients admitted to the hospital, discharge time will be determined by  your treatment team.  Patients discharged the day of surgery will not be allowed to drive home.    Special instructions:   [facility]. - Preparing For Surgery  Before surgery, you can play an important role. Because skin is not sterile, your skin needs to be as free of germs as possible. You can reduce the number of germs on your skin by washing with CHG (chlorahexidine gluconate) Soap before surgery.  CHG is an antiseptic cleaner which kills germs and bonds with the skin to continue killing germs even after washing.    Oral Hygiene is also important to reduce your risk of infection.  Remember - BRUSH YOUR TEETH THE MORNING OF SURGERY WITH YOUR REGULAR TOOTHPASTE  Please do not use if you have an allergy to CHG or antibacterial soaps. If your skin becomes reddened/irritated stop using the CHG.  Do not shave (including legs and underarms) for at least 48 hours prior to first CHG shower. It is OK to shave your face.  Please follow these instructions carefully.   1. Shower the NIGHT BEFORE SURGERY and the MORNING OF SURGERY with CHG Soap.   2. If you chose to wash your hair, wash your hair first as usual with your normal shampoo.  3. After you shampoo, rinse your hair and body thoroughly to remove the shampoo.  4. Use CHG as you would any other liquid soap. You can apply CHG directly to the skin and wash gently with a scrungie or a clean washcloth.   5. Apply the CHG Soap to your body ONLY FROM THE NECK DOWN.  Do not use on open wounds or open sores. Avoid contact with your eyes, ears, mouth and genitals (private parts). Wash Face and genitals (private parts)  with your normal soap.   6. Wash thoroughly, paying special attention to the area where your surgery will be performed.  7. Thoroughly rinse your body with warm water from the neck down.  8. DO NOT shower/wash with your normal soap after using and rinsing off the CHG Soap.  9. Pat yourself dry with a CLEAN TOWEL.  10. Wear CLEAN  PAJAMAS to bed the night before surgery, wear comfortable clothes the morning of surgery  11. Place CLEAN SHEETS on your bed the night of your first shower and DO NOT SLEEP WITH PETS.    Day of Surgery:  Do not apply any deodorants/lotions.  Please wear clean clothes to the hospital/surgery center.   Remember to brush your teeth WITH YOUR REGULAR TOOTHPASTE.   Please read over the following fact sheets that you were given.

## 2019-03-09 MED FILL — JANUVIA 100 MG TABLET: 100 | 30 days supply | Qty: 30 | Fill #1

## 2019-03-11 ENCOUNTER — Other Ambulatory Visit (HOSPITAL_COMMUNITY)
Admission: RE | Admit: 2019-03-11 | Discharge: 2019-03-11 | Disposition: A | Discharge status: Home / Self Care | Payer: No Typology Code available for payment source | Source: Ambulatory Visit | Attending: Neurosurgery | Admitting: Neurosurgery

## 2019-03-11 ENCOUNTER — Other Ambulatory Visit (HOSPITAL_COMMUNITY): Payer: 59

## 2019-03-11 DIAGNOSIS — I498 Other specified cardiac arrhythmias: Secondary | ICD-10-CM | POA: Diagnosis not present

## 2019-03-11 DIAGNOSIS — Z01818 Encounter for other preprocedural examination: Secondary | ICD-10-CM | POA: Diagnosis not present

## 2019-03-11 DIAGNOSIS — R9431 Abnormal electrocardiogram [ECG] [EKG]: Secondary | ICD-10-CM | POA: Diagnosis not present

## 2019-03-11 DIAGNOSIS — I10 Essential (primary) hypertension: Secondary | ICD-10-CM | POA: Diagnosis not present

## 2019-03-11 DIAGNOSIS — E119 Type 2 diabetes mellitus without complications: Secondary | ICD-10-CM | POA: Diagnosis not present

## 2019-03-12 LAB — SARS CORONAVIRUS 2 (TAT 6-24 HRS): SARS Coronavirus 2: NEGATIVE

## 2019-03-14 ENCOUNTER — Telehealth: Payer: Self-pay | Admitting: *Deleted

## 2019-03-14 NOTE — Telephone Encounter (Signed)
   [facility] Pre-operative Risk Assessment    Request for surgical clearance: 1. What type of surgery is being performed? T11-12 LAMINECTOMY  2. When is this surgery scheduled? 03/15/19  3. What type of clearance is required (medical clearance vs. Pharmacy clearance to hold med vs. Both)? MEDICAL  4. Are there any medications that need to be held prior to surgery and how long? ASA  5. Practice name and name of physician performing surgery? [facility]: DR. CRAM  6. What is your office phone number [phone]   7.   What is your office fax number [phone]  8.   Anesthesia type (None, local, MAC, general) ? GENERAL    Justin Key 03/14/2019, 5:10 PM  _________________________________________________________________   (provider comments below)

## 2019-03-14 NOTE — Anesthesia Preprocedure Evaluation (Addendum)
Anesthesia Evaluation  Patient identified by MRN, date of birth, ID band Patient awake    Reviewed: Allergy & Precautions, NPO status , Patient's Chart, lab work & pertinent test results  History of Anesthesia Complications Negative for: history of anesthetic complications  Airway Mallampati: I  TM Distance: >3 FB Neck ROM: Full    Dental  (+) Teeth Intact   Pulmonary neg pulmonary ROS, former smoker,    Pulmonary exam normal        Cardiovascular + angina + CAD and + Past MI (1997)  Normal cardiovascular exam     Neuro/Psych negative neurological ROS  negative psych ROS   GI/Hepatic Neg liver ROS, GERD  ,  Endo/Other  negative endocrine ROSdiabetes, Type 2, Oral Hypoglycemic Agents  Renal/GU negative Renal ROS  negative genitourinary   Musculoskeletal negative musculoskeletal ROS (+)   Abdominal   Peds  Hematology negative hematology ROS (+)   Anesthesia Other Findings Had workup for atypical chest pain 08/2018. Cath showed moderate two-vessel coronary disease involving circumflex and RCA. Medical management recommended. Had cardiology followup in January at which time symptoms were stable, not using nitroglycerin, physically active. Symptoms have remained stable since then- continues to have chest pain which he now thinks is related to his back. No progression. No dyspnea.  Reproductive/Obstetrics                           Anesthesia Physical Anesthesia Plan  ASA: III  Anesthesia Plan: General   Post-op Pain Management:    Induction: Intravenous  PONV Risk Score and Plan: 3 and Ondansetron, Dexamethasone, Midazolam and Treatment may vary due to age or medical condition  Airway Management Planned: Oral ETT  Additional Equipment: None  Intra-op Plan:   Post-operative Plan: Extubation in OR  Informed Consent: I have reviewed the patients History and Physical, chart, labs and  discussed the procedure including the risks, benefits and alternatives for the proposed anesthesia with the patient or authorized representative who has indicated his/her understanding and acceptance.     Dental advisory given  Plan Discussed with:   Anesthesia Plan Comments: (See PAT note by Karoline Caldwell, PA-C )      Anesthesia Quick Evaluation

## 2019-03-14 NOTE — Progress Notes (Signed)
Anesthesia Chart Review:  Case: [id number] Date/Time: 03/15/19 1015   Procedure: Laminectomy and Foraminotomy - T11-T12 (N/A Back)   Anesthesia type: General   Pre-op diagnosis: Myelopathy   Location: [facility] OR ROOM 18 / [facility]   Surgeon: Kary Kos, MD      DISCUSSION: 68 yo male former smoker. Pertinent hx includes CAD, MI 1997, GERD, DMII.  Follows with Dr. Daneen Schick for CAD with prior myocardial infarction 1997 treated with angioplasty. He recently had a cath in Dec 2019 to eval chest pain. Angiography demonstrated moderate two-vessel coronary disease with 35% ostial left main, 90% small first obtuse marginal, severe second obtuse marginal and moderately severe mid RCA.  LAD was widely patent.  Medical therapy was decided upon.  Last seen by Dr. Tamala Julian 10/18/18, at the time the pt reported he was not needing to use nitroglycerin. He continues to have some atypical symptoms of left scapula and shoulder discomfort. Recommended 12 mo followup.  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  Per Lorriane Shire at Dr. Windy Carina office, cardiac clearance was requested from Dr. Tamala Julian but has not been received back as of 6/23. I discussed the case with Dr. Marcie Bal. He advised that since pt has seen cardiology in the last 6 months, as long as he remains asymptomatic from CV standpoint can likely proceed as planned. Ultimately will need eval by assigned anesthesiologist on DOS.   VS: BP (P) 116/71   Pulse (!) (P) 53   Temp (P) 37 C   Resp (P) 18   Ht (P) 6\' 2"  (1.88 m)   Wt (P) 71.7 kg   SpO2 (P) 98%   BMI (P) 20.30 kg/m   PROVIDERS: Kathyrn Lass, MD is PCP  Daneen Schick, MD is Cardiologist  LABS: Labs reviewed: Acceptable for surgery. (all labs ordered are listed, but only abnormal results are displayed)  Labs Reviewed  GLUCOSE, CAPILLARY - Abnormal; Notable for the following components:      Result Value   Glucose-Capillary 315 (*)     All other components within normal limits  BASIC METABOLIC PANEL - Abnormal; Notable for the following components:   Glucose, Bld 249 (*)    All other components within normal limits  HEMOGLOBIN A1C - Abnormal; Notable for the following components:   Hgb A1c MFr Bld 7.6 (*)    All other components within normal limits  SURGICAL PCR SCREEN  CBC     IMAGES: CHEST - 2 VIEW 02/16/19  COMPARISON:  Chest x-ray dated 07/30/2017.  FINDINGS: The heart size and mediastinal contours are within normal limits. Both lungs are clear. The visualized skeletal structures are unremarkable.  IMPRESSION: No active cardiopulmonary disease.  EKG: Sinus rhythm with marked sinus arrhythmia. Rate 80. early repolarization. T wave abnormality, consider inferior ischemia. No significant change since 08/24/2018  CV: Cath 08/24/2018:  Moderately severe two-vessel coronary artery disease.  30 to 40% ostial left main with minimal cross-sectional area 9.97 mm (significant less than 6 mm).  Widely patent left anterior descending and diagonal.  LAD stops of the left ventricular apex.  The circumflex is a large vessel that gives origin to 3 obtuse marginal branches.  The first marginal is tiny, with ostial 90% narrowing.  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  The larger third obtuse marginal is widely patent.  The mid circumflex beyond the second  obtuse marginal contains eccentric 60% disease.  The right coronary contains moderately severe diffuse disease from the proximal to distal segment and also involving the PDA.  Left ventriculography reveals severe inferior and apical hypokinesis.  EF 40 to 45%.  Normal LVEDP  RECOMMENDATIONS:   Given relatively diffuse coronary disease and stable/chronic coronary presentation, appropriate management includes secondary risk prevention with LDL lowering and aggressive  management of diabetes.  Consider SGLT2 therapy for diabetes in addition to agents already being used.  Institute anti-ischemic therapy, will start low-dose beta-blocker therapy in the form of metoprolol succinate 25 mg/day and consider adding either long-acting nitrates or Ranexa if symptoms are not controlled.  Detailed history reveals as some of the patient's chest discomfort is likely non-ischemic related with much of the pain being sharp stabbing quality discomfort.   Past Medical History:  Diagnosis Date  . Back pain   . Coronary artery disease   . Diabetes mellitus without complication ([place])   . Failure to thrive in adult   . GERD (gastroesophageal reflux disease)   . Myocardial infarction ([place]) 1997    Past Surgical History:  Procedure Laterality Date  . BACK SURGERY     microdiskectomy x2  . BIOPSY  06/21/2018   Procedure: BIOPSY;  Surgeon: Wilford Corner, MD;  Location: [facility];  Service: Endoscopy;;  . COLONOSCOPY  07/12/2012   Procedure: COLONOSCOPY;  Surgeon: Lear Ng, MD;  Location: [facility];  Service: Endoscopy;  Laterality: N/A;  . COLONOSCOPY WITH PROPOFOL N/A 06/21/2018   Procedure: COLONOSCOPY WITH PROPOFOL;  Surgeon: Wilford Corner, MD;  Location: [facility];  Service: Endoscopy;  Laterality: N/A;  . ESOPHAGEAL MANOMETRY N/A 11/15/2017   Procedure: ESOPHAGEAL MANOMETRY (EM);  Surgeon: Wilford Corner, MD;  Location: [facility];  Service: Endoscopy;  Laterality: N/A;  . ESOPHAGOGASTRODUODENOSCOPY  07/12/2012   Procedure: ESOPHAGOGASTRODUODENOSCOPY (EGD);  Surgeon: Lear Ng, MD;  Location: [facility];  Service: Endoscopy;  Laterality: N/A;  . ESOPHAGOGASTRODUODENOSCOPY (EGD) WITH PROPOFOL N/A 06/21/2018   Procedure: ESOPHAGOGASTRODUODENOSCOPY (EGD) WITH PROPOFOL;  Surgeon: Wilford Corner, MD;  Location: [facility];  Service: Endoscopy;  Laterality: N/A;  . INTRAVASCULAR ULTRASOUND/IVUS N/A 08/24/2018   Procedure:  Intravascular Ultrasound/IVUS;  Surgeon: Belva Crome, MD;  Location: [facility];  Service: Cardiovascular;  Laterality: N/A;  . KNEE ARTHROSCOPY  1988   Left  . LEFT HEART CATH AND CORONARY ANGIOGRAPHY N/A 08/24/2018   Procedure: LEFT HEART CATH AND CORONARY ANGIOGRAPHY;  Surgeon: Belva Crome, MD;  Location: [facility];  Service: Cardiovascular;  Laterality: N/A;  . POLYPECTOMY  06/21/2018   Procedure: POLYPECTOMY;  Surgeon: Wilford Corner, MD;  Location: [facility];  Service: Endoscopy;;  . SPINAL CORD STIMULATOR IMPLANT    . SPINAL CORD STIMULATOR REMOVAL    . SPINAL FUSION     x 3    MEDICATIONS: . aspirin EC 81 MG tablet  . docusate sodium (COLACE) 100 MG capsule  . glimepiride (AMARYL) 4 MG tablet  . guaiFENesin (ROBITUSSIN) 100 MG/5ML liquid  . ibuprofen (ADVIL,MOTRIN) 200 MG tablet  . JANUVIA 100 MG tablet  . lidocaine (LIDODERM) 5 %  . Magnesium 250 MG TABS  . MELATONIN PO  . metFORMIN (GLUCOPHAGE-XR) 500 MG 24 hr tablet  . metoprolol succinate (TOPROL-XL) 25 MG 24 hr tablet  . nitroGLYCERIN (NITROSTAT) 0.4 MG SL tablet  . pantoprazole (PROTONIX) 40 MG tablet  . traZODone (DESYREL) 50 MG tablet   No current facility-administered medications for this encounter.  Wynonia Musty [facility] Short Stay Center/Anesthesiology Phone [phone] 03/14/2019 3:36 PM

## 2019-03-15 ENCOUNTER — Encounter (HOSPITAL_COMMUNITY): Payer: Self-pay

## 2019-03-15 ENCOUNTER — Other Ambulatory Visit: Payer: Self-pay

## 2019-03-15 ENCOUNTER — Ambulatory Visit (HOSPITAL_COMMUNITY): Payer: No Typology Code available for payment source

## 2019-03-15 ENCOUNTER — Encounter (HOSPITAL_COMMUNITY)
Admission: RE | Disposition: A | Discharge status: Home / Self Care | Payer: Self-pay | Source: Ambulatory Visit | Attending: Neurosurgery

## 2019-03-15 ENCOUNTER — Ambulatory Visit (HOSPITAL_COMMUNITY): Payer: No Typology Code available for payment source | Admitting: Physician Assistant

## 2019-03-15 ENCOUNTER — Ambulatory Visit (HOSPITAL_COMMUNITY)
Admission: RE | Admit: 2019-03-15 | Discharge: 2019-03-16 | Disposition: A | Discharge status: Home / Self Care | Payer: No Typology Code available for payment source | Source: Ambulatory Visit | Attending: Neurosurgery | Admitting: Neurosurgery

## 2019-03-15 ENCOUNTER — Ambulatory Visit (HOSPITAL_COMMUNITY): Payer: No Typology Code available for payment source | Admitting: Certified Registered Nurse Anesthetist

## 2019-03-15 DIAGNOSIS — G992 Myelopathy in diseases classified elsewhere: Secondary | ICD-10-CM | POA: Insufficient documentation

## 2019-03-15 DIAGNOSIS — Z7982 Long term (current) use of aspirin: Secondary | ICD-10-CM | POA: Insufficient documentation

## 2019-03-15 DIAGNOSIS — Z87891 Personal history of nicotine dependence: Secondary | ICD-10-CM | POA: Diagnosis not present

## 2019-03-15 DIAGNOSIS — I2510 Atherosclerotic heart disease of native coronary artery without angina pectoris: Secondary | ICD-10-CM | POA: Diagnosis not present

## 2019-03-15 DIAGNOSIS — M4804 Spinal stenosis, thoracic region: Secondary | ICD-10-CM | POA: Insufficient documentation

## 2019-03-15 DIAGNOSIS — Z7984 Long term (current) use of oral hypoglycemic drugs: Secondary | ICD-10-CM | POA: Insufficient documentation

## 2019-03-15 DIAGNOSIS — E119 Type 2 diabetes mellitus without complications: Secondary | ICD-10-CM | POA: Diagnosis not present

## 2019-03-15 DIAGNOSIS — Z419 Encounter for procedure for purposes other than remedying health state, unspecified: Secondary | ICD-10-CM

## 2019-03-15 DIAGNOSIS — Z79899 Other long term (current) drug therapy: Secondary | ICD-10-CM | POA: Insufficient documentation

## 2019-03-15 DIAGNOSIS — G959 Disease of spinal cord, unspecified: Secondary | ICD-10-CM | POA: Diagnosis present

## 2019-03-15 DIAGNOSIS — M4714 Other spondylosis with myelopathy, thoracic region: Secondary | ICD-10-CM | POA: Diagnosis present

## 2019-03-15 DIAGNOSIS — Z981 Arthrodesis status: Secondary | ICD-10-CM | POA: Insufficient documentation

## 2019-03-15 DIAGNOSIS — M5184 Other intervertebral disc disorders, thoracic region: Secondary | ICD-10-CM | POA: Insufficient documentation

## 2019-03-15 DIAGNOSIS — I252 Old myocardial infarction: Secondary | ICD-10-CM | POA: Insufficient documentation

## 2019-03-15 HISTORY — PX: THORACIC DISCECTOMY: SHX6113

## 2019-03-15 LAB — GLUCOSE, CAPILLARY
Glucose-Capillary: 90 mg/dL (ref 70–99)
Glucose-Capillary: 97 mg/dL (ref 70–99)
Glucose-Capillary: 170 mg/dL — ABNORMAL HIGH (ref 70–99)
Glucose-Capillary: 276 mg/dL — ABNORMAL HIGH (ref 70–99)
Glucose-Capillary: 183 mg/dL — ABNORMAL HIGH (ref 70–99)

## 2019-03-15 SURGERY — THORACIC DISCECTOMY
Anesthesia: General | Site: Back

## 2019-03-15 MED ORDER — CEFAZOLIN SODIUM-DEXTROSE 2-4 GM/100ML-% IV SOLN
2.0000 g | INTRAVENOUS | Status: AC
  Administered 2019-03-15: 08:00:00 2 g via INTRAVENOUS
  Filled 2019-03-15: qty 100

## 2019-03-15 MED ORDER — SUCCINYLCHOLINE CHLORIDE 200 MG/10ML IV SOSY
PREFILLED_SYRINGE | INTRAVENOUS | Status: DC | PRN
  Administered 2019-03-15: 80 mg via INTRAVENOUS

## 2019-03-15 MED ORDER — CEFAZOLIN SODIUM-DEXTROSE 2-4 GM/100ML-% IV SOLN
2.0000 g | Freq: Three times a day (TID) | INTRAVENOUS | Status: AC
  Administered 2019-03-15 (×2): 2 g via INTRAVENOUS
  Filled 2019-03-15 (×2): qty 100

## 2019-03-15 MED ORDER — ACETAMINOPHEN 10 MG/ML IV SOLN
INTRAVENOUS | Status: AC
  Filled 2019-03-15: qty 100

## 2019-03-15 MED ORDER — THROMBIN 5000 UNITS EX SOLR
CUTANEOUS | Status: AC
  Filled 2019-03-15: qty 10000

## 2019-03-15 MED ORDER — CHLORHEXIDINE GLUCONATE CLOTH 2 % EX PADS: 6.0000 | MEDICATED_PAD | Freq: Once | CUTANEOUS | Status: DC

## 2019-03-15 MED ORDER — FENTANYL CITRATE (PF) 100 MCG/2ML IJ SOLN: 25.0000 ug | INTRAMUSCULAR | Status: DC | PRN

## 2019-03-15 MED ORDER — DEXAMETHASONE SODIUM PHOSPHATE 10 MG/ML IJ SOLN
INTRAMUSCULAR | Status: AC
  Filled 2019-03-15: qty 1

## 2019-03-15 MED ORDER — NITROGLYCERIN 0.4 MG SL SUBL: 0.4000 mg | SUBLINGUAL_TABLET | SUBLINGUAL | Status: DC | PRN

## 2019-03-15 MED ORDER — PANTOPRAZOLE SODIUM 40 MG PO TBEC
40.0000 mg | DELAYED_RELEASE_TABLET | Freq: Two times a day (BID) | ORAL | Status: DC
  Administered 2019-03-15 – 2019-03-16 (×3): 40 mg via ORAL
  Filled 2019-03-15 (×3): qty 1

## 2019-03-15 MED ORDER — GLYCOPYRROLATE PF 0.2 MG/ML IJ SOSY
PREFILLED_SYRINGE | INTRAMUSCULAR | Status: DC | PRN
  Administered 2019-03-15: .2 mg via INTRAVENOUS

## 2019-03-15 MED ORDER — MIDAZOLAM HCL 2 MG/2ML IJ SOLN
INTRAMUSCULAR | Status: DC | PRN
  Administered 2019-03-15: 2 mg via INTRAVENOUS

## 2019-03-15 MED ORDER — LIDOCAINE 2% (20 MG/ML) 5 ML SYRINGE
INTRAMUSCULAR | Status: AC
  Filled 2019-03-15: qty 5

## 2019-03-15 MED ORDER — ALUM & MAG HYDROXIDE-SIMETH 200-200-20 MG/5ML PO SUSP: 30.0000 mL | Freq: Four times a day (QID) | ORAL | Status: DC | PRN

## 2019-03-15 MED ORDER — OXYCODONE HCL 5 MG/5ML PO SOLN: 5.0000 mg | Freq: Once | ORAL | Status: DC | PRN

## 2019-03-15 MED ORDER — MENTHOL 3 MG MT LOZG: 1.0000 | LOZENGE | OROMUCOSAL | Status: DC | PRN

## 2019-03-15 MED ORDER — SODIUM CHLORIDE 0.9% FLUSH: 3.0000 mL | INTRAVENOUS | Status: DC | PRN

## 2019-03-15 MED ORDER — ACETAMINOPHEN 650 MG RE SUPP: 650.0000 mg | RECTAL | Status: DC | PRN

## 2019-03-15 MED ORDER — GLYCOPYRROLATE PF 0.2 MG/ML IJ SOSY
PREFILLED_SYRINGE | INTRAMUSCULAR | Status: AC
  Filled 2019-03-15: qty 1

## 2019-03-15 MED ORDER — DOCUSATE SODIUM 100 MG PO CAPS
100.0000 mg | ORAL_CAPSULE | Freq: Two times a day (BID) | ORAL | Status: DC
  Administered 2019-03-15 – 2019-03-16 (×3): 100 mg via ORAL
  Filled 2019-03-15 (×3): qty 1

## 2019-03-15 MED ORDER — MAGNESIUM OXIDE 400 (241.3 MG) MG PO TABS
200.0000 mg | ORAL_TABLET | Freq: Every day | ORAL | Status: DC
  Administered 2019-03-15 – 2019-03-16 (×2): 200 mg via ORAL
  Filled 2019-03-15 (×2): qty 1

## 2019-03-15 MED ORDER — METOPROLOL SUCCINATE ER 25 MG PO TB24
25.0000 mg | ORAL_TABLET | Freq: Every day | ORAL | Status: DC
  Filled 2019-03-15: qty 1

## 2019-03-15 MED ORDER — THROMBIN 5000 UNITS EX SOLR
CUTANEOUS | Status: DC | PRN
  Administered 2019-03-15 (×2): 5000 [IU] via TOPICAL

## 2019-03-15 MED ORDER — BUPIVACAINE HCL (PF) 0.25 % IJ SOLN
INTRAMUSCULAR | Status: DC | PRN
  Administered 2019-03-15: 20 mL

## 2019-03-15 MED ORDER — PHENYLEPHRINE HCL-NACL 10-0.9 MG/250ML-% IV SOLN
INTRAVENOUS | Status: AC
  Filled 2019-03-15: qty 500

## 2019-03-15 MED ORDER — FENTANYL CITRATE (PF) 250 MCG/5ML IJ SOLN
INTRAMUSCULAR | Status: AC
  Filled 2019-03-15: qty 5

## 2019-03-15 MED ORDER — ACETAMINOPHEN 325 MG PO TABS
650.0000 mg | ORAL_TABLET | ORAL | Status: DC | PRN
  Administered 2019-03-15: 650 mg via ORAL
  Filled 2019-03-15: qty 2

## 2019-03-15 MED ORDER — OXYCODONE HCL 5 MG PO TABS: 5.0000 mg | ORAL_TABLET | Freq: Once | ORAL | Status: DC | PRN

## 2019-03-15 MED ORDER — ONDANSETRON HCL 4 MG/2ML IJ SOLN
INTRAMUSCULAR | Status: AC
  Filled 2019-03-15: qty 2

## 2019-03-15 MED ORDER — FENTANYL CITRATE (PF) 250 MCG/5ML IJ SOLN
INTRAMUSCULAR | Status: DC | PRN
  Administered 2019-03-15: 50 ug via INTRAVENOUS
  Administered 2019-03-15: 150 ug via INTRAVENOUS

## 2019-03-15 MED ORDER — LACTATED RINGERS IV SOLN
INTRAVENOUS | Status: DC | PRN
  Administered 2019-03-15: 07:00:00 via INTRAVENOUS

## 2019-03-15 MED ORDER — 0.9 % SODIUM CHLORIDE (POUR BTL) OPTIME
TOPICAL | Status: DC | PRN
  Administered 2019-03-15: 09:00:00 1000 mL

## 2019-03-15 MED ORDER — DEXAMETHASONE SODIUM PHOSPHATE 10 MG/ML IJ SOLN
10.0000 mg | INTRAMUSCULAR | Status: DC
  Filled 2019-03-15: qty 1

## 2019-03-15 MED ORDER — PROPOFOL 10 MG/ML IV BOLUS
INTRAVENOUS | Status: AC
  Filled 2019-03-15: qty 40

## 2019-03-15 MED ORDER — MAGNESIUM 250 MG PO TABS: 250.0000 mg | ORAL_TABLET | Freq: Every day | ORAL | Status: DC

## 2019-03-15 MED ORDER — HEMOSTATIC AGENTS (NO CHARGE) OPTIME
TOPICAL | Status: DC | PRN
  Administered 2019-03-15: 1 via TOPICAL

## 2019-03-15 MED ORDER — LIDOCAINE-EPINEPHRINE 1 %-1:100000 IJ SOLN
INTRAMUSCULAR | Status: DC | PRN
  Administered 2019-03-15: 10 mL

## 2019-03-15 MED ORDER — CYCLOBENZAPRINE HCL 10 MG PO TABS
10.0000 mg | ORAL_TABLET | Freq: Three times a day (TID) | ORAL | Status: DC | PRN
  Administered 2019-03-15 – 2019-03-16 (×3): 10 mg via ORAL
  Filled 2019-03-15 (×4): qty 1

## 2019-03-15 MED ORDER — ROCURONIUM BROMIDE 10 MG/ML (PF) SYRINGE
PREFILLED_SYRINGE | INTRAVENOUS | Status: DC | PRN
  Administered 2019-03-15: 50 mg via INTRAVENOUS

## 2019-03-15 MED ORDER — ACETAMINOPHEN 10 MG/ML IV SOLN
INTRAVENOUS | Status: DC | PRN
  Administered 2019-03-15: 1000 mg via INTRAVENOUS

## 2019-03-15 MED ORDER — ONDANSETRON HCL 4 MG/2ML IJ SOLN: 4.0000 mg | Freq: Four times a day (QID) | INTRAMUSCULAR | Status: DC | PRN

## 2019-03-15 MED ORDER — SODIUM CHLORIDE 0.9% FLUSH: 3.0000 mL | Freq: Two times a day (BID) | INTRAVENOUS | Status: DC

## 2019-03-15 MED ORDER — TRAZODONE HCL 50 MG PO TABS: 50.0000 mg | ORAL_TABLET | Freq: Every evening | ORAL | Status: DC | PRN

## 2019-03-15 MED ORDER — ASPIRIN EC 81 MG PO TBEC
81.0000 mg | DELAYED_RELEASE_TABLET | Freq: Every day | ORAL | Status: DC
  Administered 2019-03-15 – 2019-03-16 (×2): 81 mg via ORAL
  Filled 2019-03-15 (×2): qty 1

## 2019-03-15 MED ORDER — METFORMIN HCL ER 500 MG PO TB24
500.0000 mg | ORAL_TABLET | Freq: Two times a day (BID) | ORAL | Status: DC
  Filled 2019-03-15 (×2): qty 1

## 2019-03-15 MED ORDER — DEXAMETHASONE SODIUM PHOSPHATE 10 MG/ML IJ SOLN
INTRAMUSCULAR | Status: DC | PRN
  Administered 2019-03-15: 10 mg via INTRAVENOUS

## 2019-03-15 MED ORDER — SUCCINYLCHOLINE CHLORIDE 200 MG/10ML IV SOSY
PREFILLED_SYRINGE | INTRAVENOUS | Status: AC
  Filled 2019-03-15: qty 10

## 2019-03-15 MED ORDER — ONDANSETRON HCL 4 MG/2ML IJ SOLN: 4.0000 mg | Freq: Once | INTRAMUSCULAR | Status: DC | PRN

## 2019-03-15 MED ORDER — PHENOL 1.4 % MT LIQD: 1.0000 | OROMUCOSAL | Status: DC | PRN

## 2019-03-15 MED ORDER — LIDOCAINE-EPINEPHRINE 1 %-1:100000 IJ SOLN
INTRAMUSCULAR | Status: AC
  Filled 2019-03-15: qty 1

## 2019-03-15 MED ORDER — MIDAZOLAM HCL 2 MG/2ML IJ SOLN
INTRAMUSCULAR | Status: AC
  Filled 2019-03-15: qty 2

## 2019-03-15 MED ORDER — SODIUM CHLORIDE 0.9 % IV SOLN: 250.0000 mL | INTRAVENOUS | Status: DC

## 2019-03-15 MED ORDER — ONDANSETRON HCL 4 MG PO TABS: 4.0000 mg | ORAL_TABLET | Freq: Four times a day (QID) | ORAL | Status: DC | PRN

## 2019-03-15 MED ORDER — LIDOCAINE 5 % EX PTCH
1.0000 | MEDICATED_PATCH | Freq: Every day | CUTANEOUS | Status: DC | PRN
  Filled 2019-03-15: qty 1

## 2019-03-15 MED ORDER — OXYCODONE HCL 5 MG PO TABS
5.0000 mg | ORAL_TABLET | ORAL | Status: DC | PRN
  Administered 2019-03-15 – 2019-03-16 (×2): 5 mg via ORAL
  Filled 2019-03-15 (×2): qty 1

## 2019-03-15 MED ORDER — LIDOCAINE 2% (20 MG/ML) 5 ML SYRINGE
INTRAMUSCULAR | Status: DC | PRN
  Administered 2019-03-15: 100 mg via INTRAVENOUS

## 2019-03-15 MED ORDER — PHENYLEPHRINE 40 MCG/ML (10ML) SYRINGE FOR IV PUSH (FOR BLOOD PRESSURE SUPPORT)
PREFILLED_SYRINGE | INTRAVENOUS | Status: AC
  Filled 2019-03-15: qty 10

## 2019-03-15 MED ORDER — METFORMIN HCL 500 MG PO TABS
500.0000 mg | ORAL_TABLET | Freq: Two times a day (BID) | ORAL | Status: DC
  Administered 2019-03-15 – 2019-03-16 (×2): 500 mg via ORAL
  Filled 2019-03-15 (×2): qty 1

## 2019-03-15 MED ORDER — BUPIVACAINE HCL (PF) 0.25 % IJ SOLN
INTRAMUSCULAR | Status: AC
  Filled 2019-03-15: qty 30

## 2019-03-15 MED ORDER — PANTOPRAZOLE SODIUM 40 MG IV SOLR: 40.0000 mg | Freq: Every day | INTRAVENOUS | Status: DC

## 2019-03-15 MED ORDER — ONDANSETRON HCL 4 MG/2ML IJ SOLN
INTRAMUSCULAR | Status: DC | PRN
  Administered 2019-03-15: 4 mg via INTRAVENOUS

## 2019-03-15 MED ORDER — SODIUM CHLORIDE 0.9 % IV SOLN
INTRAVENOUS | Status: DC | PRN
  Administered 2019-03-15: 07:00:00 500 mL

## 2019-03-15 MED ORDER — LINAGLIPTIN 5 MG PO TABS
5.0000 mg | ORAL_TABLET | Freq: Every day | ORAL | Status: DC
  Administered 2019-03-15 – 2019-03-16 (×2): 5 mg via ORAL
  Filled 2019-03-15 (×2): qty 1

## 2019-03-15 MED ORDER — TRAZODONE HCL 50 MG PO TABS
50.0000 mg | ORAL_TABLET | Freq: Every evening | ORAL | Status: DC | PRN
  Administered 2019-03-15: 100 mg via ORAL
  Filled 2019-03-15 (×2): qty 2

## 2019-03-15 MED ORDER — PROPOFOL 10 MG/ML IV BOLUS
INTRAVENOUS | Status: DC | PRN
  Administered 2019-03-15: 140 mg via INTRAVENOUS

## 2019-03-15 MED ORDER — HYDROMORPHONE HCL 1 MG/ML IJ SOLN
1.0000 mg | INTRAMUSCULAR | Status: DC | PRN
  Administered 2019-03-15 – 2019-03-16 (×2): 1 mg via INTRAVENOUS
  Filled 2019-03-15 (×2): qty 1

## 2019-03-15 MED ORDER — SODIUM CHLORIDE 0.9 % IV SOLN
INTRAVENOUS | Status: DC | PRN
  Administered 2019-03-15: 25 ug/min via INTRAVENOUS

## 2019-03-15 MED ORDER — IBUPROFEN 200 MG PO TABS: 800.0000 mg | ORAL_TABLET | Freq: Three times a day (TID) | ORAL | Status: DC | PRN

## 2019-03-15 MED ORDER — ROCURONIUM BROMIDE 10 MG/ML (PF) SYRINGE
PREFILLED_SYRINGE | INTRAVENOUS | Status: AC
  Filled 2019-03-15: qty 10

## 2019-03-15 MED ORDER — SUGAMMADEX SODIUM 200 MG/2ML IV SOLN
INTRAVENOUS | Status: DC | PRN
  Administered 2019-03-15: 145.2 mg via INTRAVENOUS

## 2019-03-15 MED ORDER — MELATONIN 3 MG PO TABS
12.0000 mg | ORAL_TABLET | Freq: Every evening | ORAL | Status: DC | PRN
  Filled 2019-03-15: qty 4

## 2019-03-15 MED ORDER — GLIMEPIRIDE 2 MG PO TABS
8.0000 mg | ORAL_TABLET | Freq: Every day | ORAL | Status: DC
  Administered 2019-03-16: 8 mg via ORAL
  Filled 2019-03-15: qty 4

## 2019-03-15 SURGICAL SUPPLY — 58 items
ADH SKN CLS APL DERMABOND .7 (GAUZE/BANDAGES/DRESSINGS) ×1
APL SKNCLS STERI-STRIP NONHPOA (GAUZE/BANDAGES/DRESSINGS) ×1
BAG DECANTER FOR FLEXI CONT (MISCELLANEOUS) ×2 IMPLANT
BENZOIN TINCTURE PRP APPL 2/3 (GAUZE/BANDAGES/DRESSINGS) ×2 IMPLANT
BLADE CLIPPER SURG (BLADE) ×1 IMPLANT
BLADE SURG 11 STRL SS (BLADE) ×2 IMPLANT
BUR MATCHSTICK NEURO 3.0 LAGG (BURR) ×2 IMPLANT
BUR PRECISION FLUTE 6.0 (BURR) ×2 IMPLANT
CANISTER SUCT 3000ML PPV (MISCELLANEOUS) ×2 IMPLANT
CARTRIDGE OIL MAESTRO DRILL (MISCELLANEOUS) ×1 IMPLANT
COVER WAND RF STERILE (DRAPES) ×2 IMPLANT
DECANTER SPIKE VIAL GLASS SM (MISCELLANEOUS) ×2 IMPLANT
DERMABOND ADVANCED (GAUZE/BANDAGES/DRESSINGS) ×1
DERMABOND ADVANCED .7 DNX12 (GAUZE/BANDAGES/DRESSINGS) ×1 IMPLANT
DIFFUSER DRILL AIR PNEUMATIC (MISCELLANEOUS) ×2 IMPLANT
DRAPE HALF SHEET 40X57 (DRAPES) IMPLANT
DRAPE LAPAROTOMY 100X72X124 (DRAPES) ×2 IMPLANT
DRAPE MICROSCOPE LEICA (MISCELLANEOUS) ×2 IMPLANT
DRAPE POUCH INSTRU U-SHP 10X18 (DRAPES) ×2 IMPLANT
DRAPE SURG 17X23 STRL (DRAPES) ×2 IMPLANT
DRSG OPSITE POSTOP 4X6 (GAUZE/BANDAGES/DRESSINGS) ×1 IMPLANT
ELECT REM PT RETURN 9FT ADLT (ELECTROSURGICAL) ×2
ELECTRODE REM PT RTRN 9FT ADLT (ELECTROSURGICAL) ×1 IMPLANT
GAUZE 4X4 16PLY RFD (DISPOSABLE) IMPLANT
GAUZE SPONGE 4X4 12PLY STRL (GAUZE/BANDAGES/DRESSINGS) ×2 IMPLANT
GLOVE BIO SURGEON STRL SZ7 (GLOVE) ×1 IMPLANT
GLOVE BIO SURGEON STRL SZ8 (GLOVE) ×2 IMPLANT
GLOVE BIOGEL PI IND STRL 7.0 (GLOVE) IMPLANT
GLOVE BIOGEL PI IND STRL 7.5 (GLOVE) IMPLANT
GLOVE BIOGEL PI INDICATOR 7.0 (GLOVE) ×1
GLOVE BIOGEL PI INDICATOR 7.5 (GLOVE) ×3
GLOVE ECLIPSE 7.5 STRL STRAW (GLOVE) IMPLANT
GLOVE EXAM NITRILE XL STR (GLOVE) IMPLANT
GLOVE INDICATOR 8.5 STRL (GLOVE) ×2 IMPLANT
GLOVE SURG SS PI 7.0 STRL IVOR (GLOVE) ×3 IMPLANT
GOWN STRL REUS W/ TWL LRG LVL3 (GOWN DISPOSABLE) ×1 IMPLANT
GOWN STRL REUS W/ TWL XL LVL3 (GOWN DISPOSABLE) ×2 IMPLANT
GOWN STRL REUS W/TWL 2XL LVL3 (GOWN DISPOSABLE) IMPLANT
GOWN STRL REUS W/TWL LRG LVL3 (GOWN DISPOSABLE) ×2
GOWN STRL REUS W/TWL XL LVL3 (GOWN DISPOSABLE) ×4
KIT BASIN OR (CUSTOM PROCEDURE TRAY) ×2 IMPLANT
KIT TURNOVER KIT B (KITS) ×2 IMPLANT
NDL SPNL 22GX3.5 QUINCKE BK (NEEDLE) ×1 IMPLANT
NEEDLE HYPO 22GX1.5 SAFETY (NEEDLE) ×2 IMPLANT
NEEDLE SPNL 22GX3.5 QUINCKE BK (NEEDLE) ×2 IMPLANT
NS IRRIG 1000ML POUR BTL (IV SOLUTION) ×2 IMPLANT
OIL CARTRIDGE MAESTRO DRILL (MISCELLANEOUS) ×2
PACK LAMINECTOMY NEURO (CUSTOM PROCEDURE TRAY) ×2 IMPLANT
RUBBERBAND STERILE (MISCELLANEOUS) ×4 IMPLANT
SPONGE SURGIFOAM ABS GEL SZ50 (HEMOSTASIS) ×2 IMPLANT
STRIP CLOSURE SKIN 1/2X4 (GAUZE/BANDAGES/DRESSINGS) ×2 IMPLANT
SUT VIC AB 0 CT1 18XCR BRD8 (SUTURE) ×1 IMPLANT
SUT VIC AB 0 CT1 8-18 (SUTURE) ×4
SUT VIC AB 2-0 CT1 18 (SUTURE) ×2 IMPLANT
SUT VICRYL 4-0 PS2 18IN ABS (SUTURE) ×2 IMPLANT
TOWEL GREEN STERILE (TOWEL DISPOSABLE) ×2 IMPLANT
TOWEL GREEN STERILE FF (TOWEL DISPOSABLE) ×2 IMPLANT
WATER STERILE IRR 1000ML POUR (IV SOLUTION) ×2 IMPLANT

## 2019-03-15 NOTE — Anesthesia Procedure Notes (Signed)
Procedure Name: Intubation Date/Time: 03/15/2019 7:43 AM Performed by: Alain Marion, CRNA Pre-anesthesia Checklist: Patient identified, Emergency Drugs available, Suction available and Patient being monitored Patient Re-evaluated:Patient Re-evaluated prior to induction Oxygen Delivery Method: Circle System Utilized Preoxygenation: Pre-oxygenation with 100% oxygen Induction Type: IV induction and Rapid sequence Laryngoscope Size: Miller and 2 Grade View: Grade I Tube type: Oral Tube size: 7.5 mm Number of attempts: 1 Airway Equipment and Method: Stylet Placement Confirmation: ETT inserted through vocal cords under direct vision,  positive ETCO2 and breath sounds checked- equal and bilateral Secured at: 22 cm Tube secured with: Tape Dental Injury: Teeth and Oropharynx as per pre-operative assessment

## 2019-03-15 NOTE — Op Note (Signed)
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  Work-up revealed severe cord compression at T11-12.  Patient also had a small partially calcified disc at T9-10 but was not causing cord compression we extensively talked about treatment options we exhausted all forms of conservative treatment and due to his progression of clinical syndrome imaging findings of a conservative treatment I recommended decompressive laminectomy at T11-12.  After extensive review of the imaging I did not feel that was the patient's best interest to perform a laminotomy at T9-10 and further destabilize the segment for some that was not causing any cord compression and partially calcified disc that potentially could create more problems than it could solve.  And I do not think he was symptomatic at that level.  Patient understood and agreed to proceed forward.  Operative procedure: Patient brought into the OR was induced under general anesthesia positioned prone Wilson frame his back was prepped and draped in routine sterile fashion identified the residual inferior intact spinous process at T12 and mapped out incision slightly superior to this.  Incision was made after infiltration of 10 cc lidocaine with epi and Bovie electrocautery was used to take down subcu tissue and subperiosteal dissection carried lamina of T11 and 12.  Intraoperative x-ray confirmed indication a T11-12 interspace and T12 pedicle.  So remove the spinous process at T11 began a drilling down the lamina and thinning it out and then exhaling it  laterally marching laterally along the gutters and then removing en bloc the central lamina I decompress the thecal sac and spinal cord at the T11-12 interspace level.  I marched superior to the top of the T11 lamina margin inferior I removed the superior aspect the T12 lamina I was able to underbite the medial facet complex decompressing the lateral gutters palpating both the T11 and T12 pedicles and extend the laminotomy to the just inferior aspect of the T12 pedicle.  There is no further stenosis prior to decompression there was marked hourglass compression thecal sac just below the T11-12 to space.  At the end decompression was no further stenosis was copiously irrigated meticulous hemostasis was maintained Gelfoam was overlaid top of the dura and Marcaine was injected in the fascia and subcutaneous tissue.  Wound was then closed interrupted Vicryl running 4 subcuticular Dermabond benzoin Steri-Strips and a sterile dressing.  At the end the case all needle count sponge counts were correct.

## 2019-03-15 NOTE — Anesthesia Postprocedure Evaluation (Signed)
Anesthesia Post Note  Patient: Justin Key  Procedure(s) Performed: Laminectomy and Foraminotomy - Thoracic eleven -twelve (N/A Back)     Patient location during evaluation: PACU Anesthesia Type: General Level of consciousness: awake and alert Pain management: pain level controlled Vital Signs Assessment: post-procedure vital signs reviewed and stable Respiratory status: spontaneous breathing, nonlabored ventilation and respiratory function stable Cardiovascular status: blood pressure returned to baseline and stable Postop Assessment: no apparent nausea or vomiting Anesthetic complications: no    Last Vitals:  Vitals:   03/15/19 1034 03/15/19 1216  BP: (!) 154/92 (!) 81/64  Pulse: 82 94  Resp: 18 18  Temp: (!) 36.4 C 36.6 C  SpO2: 100% 100%    Last Pain:  Vitals:   03/15/19 1227  TempSrc:   PainSc: 8                  Lidia Collum

## 2019-03-15 NOTE — Telephone Encounter (Signed)
Please check to see if this preop clearance is still needed, the clearance request was scanned in yesterday, but patient just had surgery this morning based on record

## 2019-03-15 NOTE — Telephone Encounter (Signed)
Called the office of [facility] and was transferred to [facility]. Lorriane Shire was asked if the surgery clearance was still needed since the surgery was done already. She informed me that the patient was probably cleared by anesthesia. Made Almyra Deforest PA-C who is covering the Pre-op pool aware and what  Lorriane Shire stated that maybe one of our doctors could check him out. Verbally gave the information to [place].

## 2019-03-15 NOTE — H&P (Signed)
Justin Key is an 68 y.o. male.   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  Due to patient progression of clinical syndrome imaging findings failed conservative treatment I recommended decompressive thoracic ectomy at T11-12 possible decompressive laminotomy at T9-10.  I extensively gone over the risks and benefits of the procedure with him as well as perioperative course expectations of outcome and alternatives of surgery and he understood and agreed to proceed forward.  Past Medical History:  Diagnosis Date  . Back pain   . Coronary artery disease   . Diabetes mellitus without complication ([place])   . Failure to thrive in adult   . GERD (gastroesophageal reflux disease)   . Myocardial infarction ([place]) 1997    Past Surgical History:  Procedure Laterality Date  . BACK SURGERY     microdiskectomy x2  . BIOPSY  06/21/2018   Procedure: BIOPSY;  Surgeon: Wilford Corner, MD;  Location: [facility];  Service: Endoscopy;;  . COLONOSCOPY  07/12/2012   Procedure: COLONOSCOPY;  Surgeon: Lear Ng, MD;  Location: [facility];  Service: Endoscopy;  Laterality: N/A;  . COLONOSCOPY WITH PROPOFOL N/A 06/21/2018   Procedure: COLONOSCOPY WITH PROPOFOL;  Surgeon: Wilford Corner, MD;  Location: [facility];  Service: Endoscopy;  Laterality: N/A;  . ESOPHAGEAL MANOMETRY N/A 11/15/2017   Procedure: ESOPHAGEAL MANOMETRY (EM);  Surgeon: Wilford Corner, MD;  Location: [facility];  Service: Endoscopy;  Laterality: N/A;  . ESOPHAGOGASTRODUODENOSCOPY  07/12/2012   Procedure: ESOPHAGOGASTRODUODENOSCOPY (EGD);  Surgeon: Lear Ng, MD;  Location: [facility];  Service: Endoscopy;  Laterality: N/A;  . ESOPHAGOGASTRODUODENOSCOPY  (EGD) WITH PROPOFOL N/A 06/21/2018   Procedure: ESOPHAGOGASTRODUODENOSCOPY (EGD) WITH PROPOFOL;  Surgeon: Wilford Corner, MD;  Location: [facility];  Service: Endoscopy;  Laterality: N/A;  . INTRAVASCULAR ULTRASOUND/IVUS N/A 08/24/2018   Procedure: Intravascular Ultrasound/IVUS;  Surgeon: Belva Crome, MD;  Location: [place] [facility];  Service: Cardiovascular;  Laterality: N/A;  . KNEE ARTHROSCOPY  1988   Left  . LEFT HEART CATH AND CORONARY ANGIOGRAPHY N/A 08/24/2018   Procedure: LEFT HEART CATH AND CORONARY ANGIOGRAPHY;  Surgeon: Belva Crome, MD;  Location: [place] [facility];  Service: Cardiovascular;  Laterality: N/A;  . POLYPECTOMY  06/21/2018   Procedure: POLYPECTOMY;  Surgeon: Wilford Corner, MD;  Location: [facility];  Service: Endoscopy;;  . SPINAL CORD STIMULATOR IMPLANT    . SPINAL CORD STIMULATOR REMOVAL    . SPINAL FUSION     x 3    History reviewed. No pertinent family history. Social History:  reports that he quit smoking about 6 years ago. His smoking use included cigarettes. He has a 20.00 pack-year smoking history. He has never used smokeless tobacco. He reports previous alcohol use. He reports current drug use. Drug: Marijuana.  Allergies:  Allergies  Allergen Reactions  . Humibid La [Guaifenesin] Other (See Comments)    hallucinations    Medications Prior to Admission  Medication Sig Dispense Refill  . aspirin EC 81 MG tablet Take 1 tablet (81 mg total) by mouth daily. 90 tablet 3  . docusate sodium (COLACE) 100 MG capsule Take 100 mg by mouth 2 (two) times daily.    Marland Kitchen glimepiride (AMARYL) 4 MG tablet Take 8 mg by mouth daily with breakfast.     . ibuprofen (ADVIL,MOTRIN) 200  MG tablet Take 800 mg by mouth every 8 (eight) hours as needed for moderate pain.     Marland Kitchen JANUVIA 100 MG tablet Take 100 mg daily by mouth.  3  . lidocaine (LIDODERM) 5 % Place 1 patch onto the skin daily as needed (for back pain.).     . Magnesium 250 MG TABS Take 250 mg by mouth  daily.    Marland Kitchen MELATONIN PO Take 12 mg by mouth at bedtime as needed (for sleep).    . metFORMIN (GLUCOPHAGE-XR) 500 MG 24 hr tablet Take 500 mg by mouth 2 (two) times daily.  6  . metoprolol succinate (TOPROL-XL) 25 MG 24 hr tablet Take 1 tablet (25 mg total) by mouth daily. 90 tablet 3  . nitroGLYCERIN (NITROSTAT) 0.4 MG SL tablet Place 1 tablet (0.4 mg total) under the tongue every 5 (five) minutes as needed for chest pain. 25 tablet 3  . pantoprazole (PROTONIX) 40 MG tablet Take 40 mg by mouth 2 (two) times daily.    . traZODone (DESYREL) 50 MG tablet Take 50-100 mg by mouth at bedtime as needed for sleep.  5  . guaiFENesin (ROBITUSSIN) 100 MG/5ML liquid Take 10 mLs (200 mg total) by mouth 3 (three) times daily as needed for cough. (Patient not taking: Reported on 03/06/2019) 120 mL 0    Results for orders placed or performed during the hospital encounter of 03/15/19 (from the past 48 hour(s))  Glucose, capillary     Status: None   Collection Time: 03/15/19  6:08 AM  Result Value Ref Range   Glucose-Capillary 90 70 - 99 mg/dL   No results found.  Review of Systems  Musculoskeletal: Positive for back pain.  Neurological: Positive for tingling, sensory change and focal weakness.    Blood pressure 124/88, pulse 75, temperature 98.9 F (37.2 C), temperature source Oral, resp. rate 18, height 6\' 3"  (1.905 m), weight 72.6 kg, SpO2 98 %. Physical Exam  Constitutional: He is oriented to person, place, and time. He appears well-developed.  HENT:  Head: Normocephalic.  Eyes: Pupils are equal, round, and reactive to light.  Neck: Normal range of motion.  Respiratory: Effort normal.  GI: Soft. Bowel sounds are normal.  Neurological: He is alert and oriented to person, place, and time. He has normal strength. GCS eye subscore is 4. GCS verbal subscore is 5. GCS motor subscore is 6.  Strength is 5 out of 5 iliopsoas, quads, hamstrings, bilateral partial foot drops at 4 out of 5.  Skin: Skin is  warm and dry.     Assessment/Plan 68 year old gentleman presents for decompressive thoracic laminectomy at T11-12 possible laminotomy at T9-10.  Muhamed Luecke P, MD 03/15/2019, 7:26 AM

## 2019-03-15 NOTE — Progress Notes (Signed)
PT Cancellation Note  Patient Details Name: Justin Key MRN: [id number] DOB: [birth year]   Cancelled Treatment:      Attempted to see patient for initial eval. Per nursing the patient not going home and to check with patient to see what he wants to do. Patient reports he just received dilaudid and is nauseated. He wishes to be seen on 03/16/2019.    Carney Living PT DPT  03/15/2019, 1:46 PM

## 2019-03-15 NOTE — Transfer of Care (Signed)
Immediate Anesthesia Transfer of Care Note  Patient: Justin Key  Procedure(s) Performed: Laminectomy and Foraminotomy - Thoracic eleven -twelve (N/A Back)  Patient Location: PACU  Anesthesia Type:General  Level of Consciousness: awake, alert  and oriented  Airway & Oxygen Therapy: Patient Spontanous Breathing and Patient connected to face mask oxygen  Post-op Assessment: Report given to RN  Post vital signs: Reviewed and stable  Last Vitals:  Vitals Value Taken Time  BP 109/62 03/15/19 0913  Temp    Pulse 81 03/15/19 0921  Resp 23 03/15/19 0922  SpO2 100 % 03/15/19 0921  Vitals shown include unvalidated device data.  Last Pain:  Vitals:   03/15/19 0637  TempSrc:   PainSc: 8       Patients Stated Pain Goal: 3 (50/38/88 2800)  Complications: No apparent anesthesia complications

## 2019-03-15 NOTE — Evaluation (Signed)
Physical Therapy Evaluation Patient Details Name: Justin Key MRN: [id number] DOB: 1951/02/10 Today's Date: 03/15/2019   History of Present Illness  Pt is a 68 y/o male s/p T11-12 laminectomy. PMH includes CAD, DM, and MI.   Clinical Impression  Patient is s/p above surgery resulting in the deficits listed below (see PT Problem List). Pt mild unsteadiness during gait requiring min guard to min A for mobility with cane this session. Reviewed back precautions and generalized walking program with pt. Patient will benefit from skilled PT to increase their independence and safety with mobility (while adhering to their precautions) to allow discharge to the venue listed below.     Follow Up Recommendations No PT follow up;Supervision for mobility/OOB    Equipment Recommendations  None recommended by PT    Recommendations for Other Services       Precautions / Restrictions Precautions Precautions: Back Precaution Booklet Issued: No Precaution Comments: Verbally reviewed back precautions.  Restrictions Weight Bearing Restrictions: No      Mobility  Bed Mobility Overal bed mobility: Needs Assistance Bed Mobility: Supine to Sit     Supine to sit: Supervision     General bed mobility comments: Supervision for safety. Cues for log roll technique to maintain precautions.   Transfers Overall transfer level: Needs assistance Equipment used: Straight cane Transfers: Sit to/from Stand Sit to Stand: Min assist         General transfer comment: Min A for steadying assist. Pt initially with posterior lean, however, able to correct with cues.   Ambulation/Gait Ambulation/Gait assistance: Min guard Gait Distance (Feet): 150 Feet Assistive device: Straight cane Gait Pattern/deviations: Step-through pattern;Decreased stride length Gait velocity: Decreased    General Gait Details: Slow, mildly unsteady gait. Min guard A for steadying assist. Educated about generalized walking  program to perform at home.   Stairs            Wheelchair Mobility    Modified Rankin (Stroke Patients Only)       Balance Overall balance assessment: Needs assistance Sitting-balance support: No upper extremity supported;Feet supported Sitting balance-Leahy Scale: Good     Standing balance support: Single extremity supported;During functional activity Standing balance-Leahy Scale: Poor Standing balance comment: Reliant on at least 1 UE support                              Pertinent Vitals/Pain Pain Assessment: Faces Faces Pain Scale: Hurts little more Pain Location: back Pain Descriptors / Indicators: Aching;Operative site guarding Pain Intervention(s): Limited activity within patient's tolerance;Monitored during session;Repositioned    Home Living Family/patient expects to be discharged to:: Private residence Living Arrangements: Spouse/significant other Available Help at Discharge: Family;Available 24 hours/day Type of Home: House Home Access: Stairs to enter Entrance Stairs-Rails: None Entrance Stairs-Number of Steps: 4 Home Layout: Two level Home Equipment: Cane - single point;Walker - 2 wheels      Prior Function Level of Independence: Independent with assistive device(s)         Comments: Reports he used a cane     Hand Dominance        Extremity/Trunk Assessment   Upper Extremity Assessment Upper Extremity Assessment: Defer to OT evaluation    Lower Extremity Assessment Lower Extremity Assessment: Generalized weakness    Cervical / Trunk Assessment Cervical / Trunk Assessment: Other exceptions Cervical / Trunk Exceptions: s/p thoracic surgery   Communication   Communication: No difficulties  Cognition Arousal/Alertness: Awake/alert Behavior During  Therapy: WFL for tasks assessed/performed Overall Cognitive Status: Within Functional Limits for tasks assessed                                         General Comments      Exercises     Assessment/Plan    PT Assessment Patient needs continued PT services  PT Problem List Decreased strength;Decreased balance;Decreased mobility;Decreased knowledge of use of DME;Decreased knowledge of precautions;Pain       PT Treatment Interventions Gait training;DME instruction;Stair training;Functional mobility training;Therapeutic activities;Balance training;Therapeutic exercise;Neuromuscular re-education;Wheelchair mobility training    PT Goals (Current goals can be found in the Care Plan section)  Acute Rehab PT Goals Patient Stated Goal: to go home PT Goal Formulation: With patient Time For Goal Achievement: 03/29/19 Potential to Achieve Goals: Good    Frequency Min 5X/week   Barriers to discharge        Co-evaluation               AM-PAC PT "6 Clicks" Mobility  Outcome Measure Help needed turning from your back to your side while in a flat bed without using bedrails?: A Little Help needed moving from lying on your back to sitting on the side of a flat bed without using bedrails?: A Little Help needed moving to and from a bed to a chair (including a wheelchair)?: A Little Help needed standing up from a chair using your arms (e.g., wheelchair or bedside chair)?: A Little Help needed to walk in hospital room?: A Little Help needed climbing 3-5 steps with a railing? : A Lot 6 Click Score: 17    End of Session Equipment Utilized During Treatment: Gait belt Activity Tolerance: Patient tolerated treatment well Patient left: in bed;with call bell/phone within reach(sitting EOB ) Nurse Communication: Mobility status PT Visit Diagnosis: Unsteadiness on feet (R26.81);Muscle weakness (generalized) (M62.81)    Time: 1829-9371 PT Time Calculation (min) (ACUTE ONLY): 15 min   Charges:   PT Evaluation $PT Eval Low Complexity: [place], PT, DPT  Acute Rehabilitation Services  Pager: [phone] Office: [phone]   Rudean Hitt 03/15/2019, 5:58 PM

## 2019-03-16 ENCOUNTER — Encounter (HOSPITAL_COMMUNITY): Payer: Self-pay | Admitting: Neurosurgery

## 2019-03-16 DIAGNOSIS — M4804 Spinal stenosis, thoracic region: Secondary | ICD-10-CM | POA: Diagnosis not present

## 2019-03-16 LAB — GLUCOSE, CAPILLARY: Glucose-Capillary: 74 mg/dL (ref 70–99)

## 2019-03-16 NOTE — Discharge Summary (Signed)
  Physician Discharge Summary  Patient ID: ALROY PORTELA MRN: [id number] DOB/AGE: [birth year] 68 y.o. Estimated body mass index is 20 kg/m as calculated from the following:   Height as of this encounter: 6\' 3"  (1.905 m).   Weight as of this encounter: 72.6 kg.   Admit date: 03/15/2019 Discharge date: 03/16/2019  Admission Diagnoses: Thoracic myelopathy  Discharge Diagnoses: Same Active Problems:   Myelopathy [facility])   Discharged Condition: good  Hospital Course: Patient is admitted to the hospital underwent decompressive thoracic laminectomy postop is doing very well significant provement in with his legs feel postoperatively increased improved walking.  Incision clean dry and intact plan discharge home  Consults: Significant Diagnostic Studies: Treatments: T11-12 decompressive laminectomy Discharge Exam: Blood pressure 114/69, pulse 64, temperature 97.7 F (36.5 C), temperature source Oral, resp. rate 19, height 6\' 3"  (1.905 m), weight 72.6 kg, SpO2 100 %. Strength 5 out of 5 wound clean dry and intact  Disposition: Home      Signed: Amoria Mclees P 03/16/2019, 7:38 AM

## 2019-03-16 NOTE — Progress Notes (Signed)
Patient is discharged rom room 3C09 at this time. Alert and in stable condition. IV site d/c'd and instructions read to patient with understanding verbalized. Left unit via wheelchair with all belongings at side.

## 2019-03-16 NOTE — Discharge Instructions (Signed)

## 2019-03-16 NOTE — Progress Notes (Signed)
Physical Therapy Treatment Patient Details Name: Justin Key MRN: [id number] DOB: 03-06-1951 Today's Date: 03/16/2019    History of Present Illness Pt is a 68 y/o male s/p T11-12 laminectomy. PMH includes CAD, DM, and MI.     PT Comments    Pt progressing towards physical therapy goals. Required frequent cues for maintenance of precautions. Recommend use of RW for now as pt still reaching for outside support with SPC. Pt educated on precautions, stair negotiation, car transfer, and activity progression. Will continue to follow however pt anticipates d/c home today.     Follow Up Recommendations  No PT follow up;Supervision for mobility/OOB     Equipment Recommendations  None recommended by PT    Recommendations for Other Services       Precautions / Restrictions Precautions Precautions: Back Precaution Booklet Issued: Yes (comment) Precaution Comments: Verbally reviewed back precautions during functional mobility.  Restrictions Weight Bearing Restrictions: No    Mobility  Bed Mobility Overal bed mobility: Needs Assistance Bed Mobility: Sidelying to Sit;Rolling Rolling: Supervision Sidelying to sit: Supervision Supine to sit: Supervision     General bed mobility comments: Supervision for safety. Cues for log roll technique to maintain precautions.   Transfers Overall transfer level: Needs assistance Equipment used: Straight cane;Rolling walker (2 wheeled) Transfers: Sit to/from Stand Sit to Stand: Supervision         General transfer comment: close supervision for safety. safer with RW.   Ambulation/Gait Ambulation/Gait assistance: Min guard Gait Distance (Feet): 200 Feet Assistive device: Straight cane Gait Pattern/deviations: Step-through pattern;Decreased stride length Gait velocity: Decreased  Gait velocity interpretation: <1.8 ft/sec, indicate of risk for recurrent falls General Gait Details: Slow, mildly unsteady gait. Min guard A for steadying  assist. Educated about generalized walking program to perform at home.    Stairs Stairs: Yes Stairs assistance: Min guard Stair Management: Two rails;Step to pattern;Forwards Number of Stairs: 10 General stair comments: VC's for sequencing and general safety.    Wheelchair Mobility    Modified Rankin (Stroke Patients Only)       Balance Overall balance assessment: Needs assistance Sitting-balance support: No upper extremity supported;Feet supported Sitting balance-Leahy Scale: Good     Standing balance support: Single extremity supported;During functional activity Standing balance-Leahy Scale: Poor Standing balance comment: Reliant on at least 1 UE support                             Cognition Arousal/Alertness: Awake/alert Behavior During Therapy: WFL for tasks assessed/performed Overall Cognitive Status: Impaired/Different from baseline Area of Impairment: Safety/judgement                         Safety/Judgement: Decreased awareness of safety     General Comments: Pt impulsive with movements and requires verbal cues to slow down and adhere to back precautions.      Exercises      General Comments        Pertinent Vitals/Pain Pain Assessment: Faces Faces Pain Scale: Hurts little more Pain Location: back Pain Descriptors / Indicators: Aching;Operative site guarding Pain Intervention(s): Monitored during session    Home Living Family/patient expects to be discharged to:: Private residence Living Arrangements: Spouse/significant other Available Help at Discharge: Family;Available 24 hours/day Type of Home: House Home Access: Stairs to enter Entrance Stairs-Rails: None Home Layout: Two level Home Equipment: Cane - single point;Walker - 2 wheels;Shower seat      Prior Function  Level of Independence: Independent with assistive device(s)      Comments: Reports he used a cane   PT Goals (current goals can now be found in the care  plan section) Acute Rehab PT Goals Patient Stated Goal: to go home PT Goal Formulation: With patient Time For Goal Achievement: 03/29/19 Potential to Achieve Goals: Good Progress towards PT goals: Progressing toward goals    Frequency    Min 5X/week      PT Plan Current plan remains appropriate    Co-evaluation              AM-PAC PT "6 Clicks" Mobility   Outcome Measure  Help needed turning from your back to your side while in a flat bed without using bedrails?: A Little Help needed moving from lying on your back to sitting on the side of a flat bed without using bedrails?: A Little Help needed moving to and from a bed to a chair (including a wheelchair)?: A Little Help needed standing up from a chair using your arms (e.g., wheelchair or bedside chair)?: A Little Help needed to walk in hospital room?: A Little Help needed climbing 3-5 steps with a railing? : A Lot 6 Click Score: 17    End of Session Equipment Utilized During Treatment: Gait belt Activity Tolerance: Patient tolerated treatment well Patient left: in bed;with call bell/phone within reach(sitting EOB ) Nurse Communication: Mobility status PT Visit Diagnosis: Unsteadiness on feet (R26.81);Muscle weakness (generalized) (M62.81)     Time: 7290-2111 PT Time Calculation (min) (ACUTE ONLY): 31 min  Charges:  $Gait Training: 23-37 mins                     Rolinda Roan, PT, DPT Acute Rehabilitation Services Pager: [phone] Office: [phone]    Thelma Comp 03/16/2019, 11:14 AM

## 2019-03-16 NOTE — Progress Notes (Signed)
Occupational Therapy Evaluation Patient Details Name: Justin Key MRN: [id number] DOB: [birth year] Today's Date: 03/16/2019    History of Present Illness Pt is a 68 y/o male s/p T11-12 laminectomy. PMH includes CAD, DM, and MI.    Clinical Impression   Pt s/p above surgery.  PTA pt is independent with cane during ADLs. Pt requiring min guard assist for functional mobility within room with cane. Requires supervision with frequent cueing for safety and to adhere to back precautions since he is very impulsive with movements. Will continue to follow acutely in order to maximize safety and independence with ADLs.    Follow Up Recommendations  No OT follow up;Supervision/Assistance - 24 hour    Equipment Recommendations  None recommended by OT    Recommendations for Other Services       Precautions / Restrictions Precautions Precautions: Back Precaution Booklet Issued: Yes (comment) Precaution Comments: Verbally reviewed back precautions.  Restrictions Weight Bearing Restrictions: No      Mobility Bed Mobility Overal bed mobility: Needs Assistance Bed Mobility: Sidelying to Sit;Rolling Rolling: Supervision Sidelying to sit: Supervision       General bed mobility comments: Supervision for safety. Cues for log roll technique to maintain precautions.   Transfers Overall transfer level: Needs assistance Equipment used: Straight cane Transfers: Sit to/from Stand Sit to Stand: Supervision         General transfer comment: close supervision for safety    Balance                                           ADL either performed or assessed with clinical judgement   ADL Overall ADL's : Needs assistance/impaired Eating/Feeding: Independent;Sitting   Grooming: Supervision/safety;Standing   Upper Body Bathing: Supervision/ safety;Sitting   Lower Body Bathing: Supervison/ safety;Sit to/from stand;Cueing for back precautions;Cueing for safety   Upper  Body Dressing : Supervision/safety;Sitting   Lower Body Dressing: Supervision/safety;Cueing for safety;Cueing for back precautions;Adhering to back precautions;Sit to/from stand   Toilet Transfer: Min guard;Ambulation Toilet Transfer Details (indicate cue type and reason): simulated Toileting- Clothing Manipulation and Hygiene: Supervision/safety;Cueing for safety;Sit to/from stand;Cueing for back precautions       Functional mobility during ADLs: Min guard       Vision         Perception     Praxis      Pertinent Vitals/Pain Pain Assessment: Faces Faces Pain Scale: Hurts little more Pain Location: back Pain Descriptors / Indicators: Aching;Operative site guarding Pain Intervention(s): Limited activity within patient's tolerance;Monitored during session;Repositioned     Hand Dominance     Extremity/Trunk Assessment Upper Extremity Assessment Upper Extremity Assessment: Overall WFL for tasks assessed   Lower Extremity Assessment Lower Extremity Assessment: Defer to PT evaluation   Cervical / Trunk Assessment Cervical / Trunk Assessment: Other exceptions Cervical / Trunk Exceptions: s/p thoracic surgery    Communication Communication Communication: No difficulties   Cognition Arousal/Alertness: Awake/alert Behavior During Therapy: WFL for tasks assessed/performed Overall Cognitive Status: Impaired/Different from baseline Area of Impairment: Safety/judgement                         Safety/Judgement: Decreased awareness of safety     General Comments: Pt impulsive with movements and requires verbal cues to slow down and adhere to back precautions.   General Comments       Exercises  Shoulder Instructions      Home Living Family/patient expects to be discharged to:: Private residence Living Arrangements: Spouse/significant other Available Help at Discharge: Family;Available 24 hours/day Type of Home: House Home Access: Stairs to  enter [employer] of Steps: 4 Entrance Stairs-Rails: None Home Layout: Two level Alternate Level Stairs-Number of Steps: flight Alternate Level Stairs-Rails: Right;Left Bathroom Shower/Tub: Teacher, early years/pre: Standard     Home Equipment: Cane - single point;Walker - 2 wheels;Shower seat          Prior Functioning/Environment Level of Independence: Independent with assistive device(s)        Comments: Reports he used a cane        OT Problem List: Decreased safety awareness;Decreased knowledge of use of DME or AE;Decreased knowledge of precautions;Pain      OT Treatment/Interventions: Self-care/ADL training;DME and/or AE instruction;Therapeutic activities;Patient/family education    OT Goals(Current goals can be found in the care plan section) Acute Rehab OT Goals Patient Stated Goal: to go home OT Goal Formulation: With patient Time For Goal Achievement: 03/30/19 Potential to Achieve Goals: Good  OT Frequency: Min 2X/week   Barriers to D/C:            Co-evaluation              AM-PAC OT "6 Clicks" Daily Activity     Outcome Measure Help from another person eating meals?: None Help from another person taking care of personal grooming?: A Little Help from another person toileting, which includes using toliet, bedpan, or urinal?: A Little Help from another person bathing (including washing, rinsing, drying)?: A Little(cues for safety and precautions) Help from another person to put on and taking off regular upper body clothing?: A Little(cues for safety and precautions) Help from another person to put on and taking off regular lower body clothing?: A Little(cues for safety and precautions) 6 Click Score: 19   End of Session Nurse Communication: Mobility status  Activity Tolerance: Patient tolerated treatment well Patient left: in bed;with call bell/phone within reach  OT Visit Diagnosis: Pain                Time:  7322-0254 OT Time Calculation (min): 23 min Charges:  OT General Charges $OT Visit: 1 Visit OT Evaluation $OT Eval Low Complexity: 1 Low OT Treatments $Self Care/Home Management : 8-22 mins   Darrol Jump OTR/L Acute Rehab Services [phone] 03/16/2019, 10:31 AM

## 2019-03-21 DIAGNOSIS — F5101 Primary insomnia: Secondary | ICD-10-CM | POA: Diagnosis not present

## 2019-03-21 MED FILL — traZODone HCL 50 MG TABS: 50 | 30 days supply | Qty: 60 | Fill #0

## 2019-03-27 MED FILL — METFORMIN HCL ER 500 MG TB2: 500 | 30 days supply | Qty: 60 | Fill #2

## 2019-04-25 DIAGNOSIS — H0014 Chalazion left upper eyelid: Secondary | ICD-10-CM | POA: Diagnosis not present

## 2019-04-25 MED FILL — TOBRAMYCIN 0.3 % SOLN: 0.3 | 25 days supply | Qty: 5 | Fill #0

## 2019-04-26 MED FILL — traZODone HCL 50 MG TABS: 50 | 30 days supply | Qty: 60 | Fill #0

## 2019-04-26 MED FILL — KETOCONAZOLE 2% CREAM: 2 | 15 days supply | Qty: 30 | Fill #1

## 2019-05-01 MED FILL — JANUVIA 100 MG TABLET: 100 | 30 days supply | Qty: 30 | Fill #0

## 2019-05-16 MED FILL — metFORMIN HCL ER 500 MG TB2: 500 | 30 days supply | Qty: 60 | Fill #0

## 2019-05-17 DIAGNOSIS — F5101 Primary insomnia: Secondary | ICD-10-CM | POA: Diagnosis not present

## 2019-05-17 DIAGNOSIS — R0989 Other specified symptoms and signs involving the circulatory and respiratory systems: Secondary | ICD-10-CM | POA: Diagnosis not present

## 2019-05-23 DIAGNOSIS — I10 Essential (primary) hypertension: Secondary | ICD-10-CM | POA: Diagnosis not present

## 2019-05-23 DIAGNOSIS — E7800 Pure hypercholesterolemia, unspecified: Secondary | ICD-10-CM | POA: Diagnosis not present

## 2019-05-23 DIAGNOSIS — E1165 Type 2 diabetes mellitus with hyperglycemia: Secondary | ICD-10-CM | POA: Diagnosis not present

## 2019-05-30 DIAGNOSIS — E1165 Type 2 diabetes mellitus with hyperglycemia: Secondary | ICD-10-CM | POA: Diagnosis not present

## 2019-05-30 DIAGNOSIS — I10 Essential (primary) hypertension: Secondary | ICD-10-CM | POA: Diagnosis not present

## 2019-05-30 DIAGNOSIS — Z7689 Persons encountering health services in other specified circumstances: Secondary | ICD-10-CM | POA: Diagnosis not present

## 2019-05-30 DIAGNOSIS — E7800 Pure hypercholesterolemia, unspecified: Secondary | ICD-10-CM | POA: Diagnosis not present

## 2019-05-30 DIAGNOSIS — R5383 Other fatigue: Secondary | ICD-10-CM | POA: Diagnosis not present

## 2019-06-12 ENCOUNTER — Telehealth: Payer: Self-pay

## 2019-06-12 NOTE — Telephone Encounter (Signed)
Called and spoke w/ pt. Pt states he has been having increased thick, frothy sputum for over 6 months now. Denies any acute symptoms such as fever/chills/body aches. He is inquiring if he could get a sputum culture. Since he has not been seen in over a year ([facility] 10/12/2018), I offered pt an appt w/ MW, which he agreed to. Pt's spouse, Mariann Laster (on [place]), helped set up this appt. She agreed to scheduling an appt for 06/14/2019 at 3:15 PM EDT. Mariann Laster is aware of the new office location, universal mask policy, and no visitors policy. Pt is negative for COVID screen. Appt has been scheduled. Nothing further needed at this time.

## 2019-06-13 MED FILL — JANUVIA 100 MG TABLET: 100 | 30 days supply | Qty: 30 | Fill #1

## 2019-06-13 MED FILL — GLIMEPIRIDE 4 MG TABS: 4 | 90 days supply | Qty: 180 | Fill #1

## 2019-06-13 MED FILL — METOPROLOL SUCCINATE ER 25: 25 | 90 days supply | Qty: 90 | Fill #1

## 2019-06-14 ENCOUNTER — Encounter: Payer: Self-pay | Admitting: Internal Medicine

## 2019-06-14 ENCOUNTER — Ambulatory Visit (INDEPENDENT_AMBULATORY_CARE_PROVIDER_SITE_OTHER): Payer: 59 | Admitting: Internal Medicine

## 2019-06-14 ENCOUNTER — Other Ambulatory Visit: Payer: Self-pay

## 2019-06-14 DIAGNOSIS — R05 Cough: Secondary | ICD-10-CM | POA: Diagnosis not present

## 2019-06-14 DIAGNOSIS — R918 Other nonspecific abnormal finding of lung field: Secondary | ICD-10-CM | POA: Diagnosis not present

## 2019-06-14 DIAGNOSIS — R058 Other specified cough: Secondary | ICD-10-CM

## 2019-06-14 MED ORDER — FLUTTER DEVI: 0 refills | Status: DC

## 2019-06-14 MED FILL — METFORMIN HCL ER 500 MG TB2: 500 | 30 days supply | Qty: 60 | Fill #0

## 2019-06-14 MED FILL — traZODone HCL 50 MG TABS: 50 | 90 days supply | Qty: 180 | Fill #0

## 2019-06-14 NOTE — Patient Instructions (Addendum)
We will be calling you to set up a ct chest in July 2021   We will provide you with a flutter valve  Please remember to go to the lab department   for your tests - we will call you with the results when they are available.      Protonix Take 30- 60 min before your first and last meals of the day   GERD (REFLUX)  is an extremely common cause of respiratory symptoms just like yours , many times with no obvious heartburn at all.    It can be treated with medication, but also with lifestyle changes including elevation of the head of your bed (ideally with 6-8inch blocks under the headboard of your bed),  Smoking cessation, avoidance of late meals, excessive alcohol, and avoid fatty foods, chocolate, peppermint, colas, red wine, and acidic juices such as orange juice.  NO MINT OR MENTHOL PRODUCTS SO NO COUGH DROPS  USE SUGARLESS CANDY INSTEAD (Jolley ranchers or Stover's or Life Savers) or even ice chips will also do - the key is to swallow to prevent all throat clearing. NO OIL BASED VITAMINS - use powdered substitutes.  Avoid fish oil when coughing.   Please schedule a follow up office visit in 6 weeks, call sooner if needed pfts and cxr

## 2019-06-14 NOTE — Progress Notes (Signed)
Subjective:     Patient ID: Justin Key, male   DOB: [birth year],    MRN: [id number]    Brief patient profile:  65  yobm retired nurse  quit smoking 2013  With  w/u for anorexia/ wt loss > GG density RLL on CT chest 08/20/17 so referred to pulmonary clinic 10/12/2017 by Dr   Kathyrn Lass     History of Present Illness  10/12/2017 1st [facility] office visit/ [name]   Chief Complaint  Patient presents with  . New Consult    CT scan showed nodule RLL, weight loss  started w/d from narcotics for chronic back pain  early June 2018 weaned off mid July  2018 completely off all rx and rough time since with fatigue/ wt loss/ back pain but no resp symptoms   rec  We will call you in 6 months to be sure the follow up CT is done - if you have any new respiratory symptoms in meantime please call for earlier follow up      06/14/2019  Acute ose ov/Annastyn Silvey re: GG nodule / chronic cough in gastroparesis  Chief Complaint  Patient presents with  . Acute Visit    Cough with thick, white, frothy sputum for the past 6 months. He has noticed some wheezing.   Dyspnea:  No shopping / rarely leaves home due to weakness/ back pain not doe  Cough: more p supper / assoc subjective wheeze/ no sinus symptoms/ worse p burps / denies obviously choking on food or flare of cough during or shortly after swallowing/ mucus is white and variably copious "but I'm not really coughing"  Sleeping: bed flat. Sleeps On side  SABA use: none 02: none    No obvious patterns in day to day or daytime variability or assoc   purulent sputum or mucus plugs or hemoptysis or cp or chest tightness,   or overt sinus or hb symptoms.   Sleeping  without nocturnal    of respiratory  c/o's or need for noct saba. Also denies any obvious fluctuation of symptoms with weather or environmental changes or other aggravating or alleviating factors except as outlined above   No unusual exposure hx or h/o childhood pna/ asthma or knowledge of  premature birth.  Current Allergies, Complete Past Medical History, Past Surgical History, Family History, and Social History were reviewed in [employer] record.  ROS  The following are not active complaints unless bolded Hoarseness, sore throat, dysphagia, dental problems, itching, sneezing,  nasal congestion or discharge of excess mucus or purulent secretions, ear ache,   fever, chills, sweats, unintended wt loss or wt gain, classically pleuritic or exertional cp,  orthopnea pnd or arm/hand swelling  or leg swelling, presyncope, palpitations, abdominal pain, anorexia, nausea, vomiting, diarrhea  or change in bowel habits or change in bladder habits, change in stools or change in urine, dysuria, hematuria,  rash, arthralgias/back pain , visual complaints, headache, numbness, weakness or ataxia or problems with walking or coordination,  change in mood or  Memory. Severe nail fungus changes         Current Meds  Medication Sig  . aspirin EC 81 MG tablet Take 1 tablet (81 mg total) by mouth daily.  Marland Kitchen docusate sodium (COLACE) 100 MG capsule Take 100 mg by mouth 2 (two) times daily.  Marland Kitchen glimepiride (AMARYL) 4 MG tablet Take 8 mg by mouth daily with breakfast.   . guaiFENesin (ROBITUSSIN) 100 MG/5ML liquid Take 10 mLs (200 mg  total) by mouth 3 (three) times daily as needed for cough.  Marland Kitchen ibuprofen (ADVIL,MOTRIN) 200 MG tablet Take 800 mg by mouth every 8 (eight) hours as needed for moderate pain.   Marland Kitchen JANUVIA 100 MG tablet Take 100 mg daily by mouth.  . lidocaine (LIDODERM) 5 % Place 1 patch onto the skin daily as needed (for back pain.).   . Magnesium 250 MG TABS Take 250 mg by mouth daily.  Marland Kitchen MELATONIN PO Take 12 mg by mouth at bedtime as needed (for sleep).  . metFORMIN (GLUCOPHAGE-XR) 500 MG 24 hr tablet Take 500 mg by mouth 2 (two) times daily.  . metoprolol succinate (TOPROL-XL) 25 MG 24 hr tablet Take 1 tablet (25 mg total) by mouth daily.  . nitroGLYCERIN (NITROSTAT) 0.4  MG SL tablet Place 1 tablet (0.4 mg total) under the tongue every 5 (five) minutes as needed for chest pain.  . pantoprazole (PROTONIX) 40 MG tablet Take 40 mg by mouth 2 (two) times daily.  . traZODone (DESYREL) 50 MG tablet Take 50-100 mg by mouth at bedtime as needed for sleep.                Objective:   Physical Exam      06/14/2019       163   10/12/17 162 lb 12.8 oz (73.8 kg)  07/30/17 220 lb (99.8 kg)  07/12/12 220 lb (99.8 kg)     Vital signs reviewed - Note on arrival 02 sats  98% on RA    Thin chronically ill appearing bm wearing corsette     HEENT : pt wearing mask not removed for exam due to covid -19 concerns.    NECK :  without JVD/Nodes/TM/ nl carotid upstrokes bilaterally   LUNGS: no acc muscle use,  Nl contour chest which is clear to A and P bilaterally without cough on insp or exp maneuvers   CV:  RRR  no s3 or murmur or increase in P2, and no edema   ABD:  soft and nontender with nl inspiratory excursion in the supine position. No bruits or organomegaly appreciated, bowel sounds nl/ corsette in place   MS:  Nl gait/ ext warm without deformities, calf tenderness, cyanosis or clubbing No obvious joint restrictions   SKIN: warm and dry with classic severe onychomycosis both hands   NEURO:  alert, approp, nl sensorium with  no motor or cerebellar deficits apparent.          I personally reviewed images and agree with radiology impression as follows:  CXR:   02/16/2019  No active cardiopulmonary disease.  Labs ordered 06/14/2019  :  Sputum gm stain and culture  Assessment:

## 2019-06-14 NOTE — Assessment & Plan Note (Signed)
CT chest  08/20/17  RLL gg  18 mm> in reminder file for 03/20/18  - 10/12/2017  ESR = 10  - 10/12/2017   Quant TB neg - CT chest 04/07/2018 Persistent ground-glass nodule in the RIGHT lower lobe. Per consensus recommendation, follow-up CT without contrast in 24 months from current month.> reminder for 03/2020   Discussed in detail all the  indications, usual  risks and alternatives  relative to the benefits with patient who agrees to proceed with w/u as above

## 2019-06-15 ENCOUNTER — Encounter: Payer: Self-pay | Admitting: Internal Medicine

## 2019-06-15 DIAGNOSIS — R058 Other specified cough: Secondary | ICD-10-CM | POA: Insufficient documentation

## 2019-06-15 DIAGNOSIS — R05 Cough: Secondary | ICD-10-CM | POA: Insufficient documentation

## 2019-06-15 NOTE — Assessment & Plan Note (Addendum)
Onset around 11/2018 assoc with gastroparesis, worse p belching  - rx flutter valve 06/14/2019   Above h/o is typical of Upper airway cough syndrome (previously labeled PNDS),  is so named because it's frequently impossible to sort out how much is  CR/sinusitis with freq throat clearing (which can be related to primary GERD)   vs  causing  secondary (" extra esophageal")  GERD from wide swings in gastric pressure that occur with throat clearing, often  promoting self use of mint and menthol lozenges that reduce the lower esophageal sphincter tone and exacerbate the problem further in a cyclical fashion.   These are the same pts (now being labeled as having "irritable larynx syndrome" by some cough centers) who not infrequently have a history of having failed to tolerate ace inhibitors,  dry powder inhalers or biphosphonates or report having atypical/extraesophageal reflux symptoms that don't respond to standard doses of PPI  and are easily confused as having aecopd or asthma flares by even experienced allergists/ pulmonologists (myself included).   rec max rx for gerd/ bed blocks and gi f/u   Pt requested sputum culture but with report of white mucus and clear exam this is unlikely to help as I strongly feel the problem is gi related.   >>>   Needs return in 6 weeks for baseline cxr/ pfts and note risk of bronchiectasis but not apparent clinically yet   I had an extended discussion with the patient/wife  reviewing all relevant studies completed to date and  lasting 15 to 20 minutes of a 25 minute acute office visit    I performed device teaching  using a teach back technique which also  extended face to face time for this visit (flutter valve)  Each maintenance medication was reviewed in detail including most importantly the difference between maintenance and prns and under what circumstances the prns are to be triggered using an action plan format that is not reflected in the computer generated  alphabetically organized AVS.     Please see AVS for specific instructions unique to this visit that I personally wrote and verbalized to the the pt in detail and then reviewed with pt  by my nurse highlighting any  changes in therapy recommended at today's visit to their plan of care.

## 2019-06-17 LAB — RESPIRATORY CULTURE OR RESPIRATORY AND SPUTUM CULTURE
MICRO NUMBER:: 913888
RESULT:: NORMAL
SPECIMEN QUALITY:: ADEQUATE

## 2019-07-24 DIAGNOSIS — Z131 Encounter for screening for diabetes mellitus: Secondary | ICD-10-CM | POA: Diagnosis not present

## 2019-07-24 DIAGNOSIS — Z79890 Hormone replacement therapy (postmenopausal): Secondary | ICD-10-CM | POA: Diagnosis not present

## 2019-07-24 DIAGNOSIS — E559 Vitamin D deficiency, unspecified: Secondary | ICD-10-CM | POA: Diagnosis not present

## 2019-07-24 DIAGNOSIS — Z1329 Encounter for screening for other suspected endocrine disorder: Secondary | ICD-10-CM | POA: Diagnosis not present

## 2019-07-24 DIAGNOSIS — E038 Other specified hypothyroidism: Secondary | ICD-10-CM | POA: Diagnosis not present

## 2019-07-24 DIAGNOSIS — E2740 Unspecified adrenocortical insufficiency: Secondary | ICD-10-CM | POA: Diagnosis not present

## 2019-07-24 DIAGNOSIS — E291 Testicular hypofunction: Secondary | ICD-10-CM | POA: Diagnosis not present

## 2019-07-24 DIAGNOSIS — Z125 Encounter for screening for malignant neoplasm of prostate: Secondary | ICD-10-CM | POA: Diagnosis not present

## 2019-07-24 DIAGNOSIS — E539 Vitamin B deficiency, unspecified: Secondary | ICD-10-CM | POA: Diagnosis not present

## 2019-08-03 MED FILL — JANUVIA 100 MG TABLET: 100 | 30 days supply | Qty: 30 | Fill #0

## 2019-08-15 MED FILL — LIOTHYRONINE SODIUM 5 MCG T: 5 | 30 days supply | Qty: 60 | Fill #0

## 2019-08-22 MED FILL — AMOXICILLIN 500 MG CAPSULE: 500 | 7 days supply | Qty: 21 | Fill #0

## 2019-08-25 ENCOUNTER — Other Ambulatory Visit: Payer: Self-pay | Admitting: Interventional Cardiology

## 2019-08-25 MED FILL — metFORMIN HCL ER 500 MG TB2: 500 | 90 days supply | Qty: 180 | Fill #1

## 2019-09-01 MED FILL — METOPROLOL SUCCINATE ER 25: 25 | 90 days supply | Qty: 90 | Fill #0

## 2019-09-08 ENCOUNTER — Other Ambulatory Visit: Payer: Self-pay | Admitting: Interventional Cardiology

## 2019-09-08 DIAGNOSIS — R0789 Other chest pain: Secondary | ICD-10-CM

## 2019-09-25 MED FILL — traZODone HCL 50 MG TABS: 50 | 90 days supply | Qty: 180 | Fill #0

## 2019-09-29 ENCOUNTER — Ambulatory Visit: Payer: 59 | Admitting: Interventional Cardiology

## 2019-10-03 MED FILL — PANTOPRAZOLE SOD DR 40 MG T: 40 | 90 days supply | Qty: 180 | Fill #1

## 2019-10-09 MED FILL — AMOXICILLIN 500 MG CAPSULE: 500 | 7 days supply | Qty: 21 | Fill #0

## 2019-10-19 MED FILL — JANUVIA 100 MG TABLET: 100 | 30 days supply | Qty: 30 | Fill #1

## 2019-10-19 MED FILL — GLIMEPIRIDE 4 MG TABLET: 4 | 90 days supply | Qty: 180 | Fill #0

## 2019-11-22 IMAGING — CT CT CHEST W/O CM
2 of 3 series · 15 of 36 positions shown, 18 images · non-contrast
Comparison: CT 08/20/2017

CLINICAL DATA: Follow-up lung nodule.

EXAM:
CT CHEST WITHOUT CONTRAST
TECHNIQUE: Multidetector CT imaging of the chest was performed following the
standard protocol without IV contrast.

[Series 2: thorax · axial · 0.76mm/px · z∈[-354,-66]mm · 12 of 170 slices shown, 15 images]
[im 13/170  mediastinal]
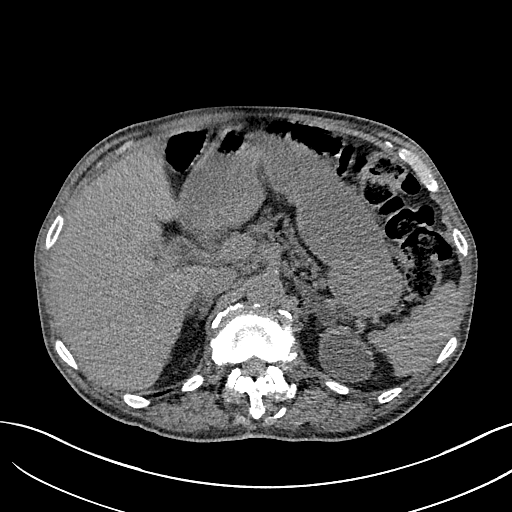
[im 13/170  lung]
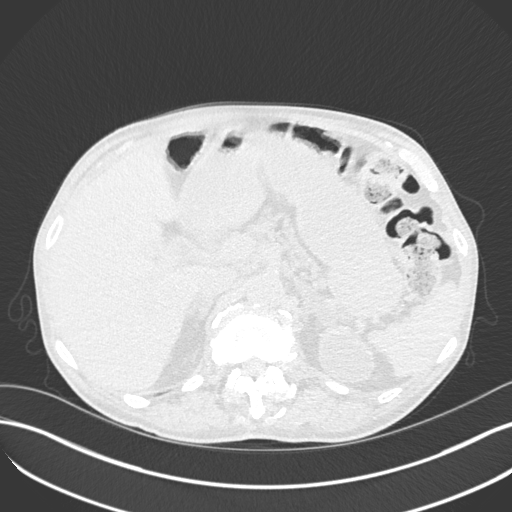
[im 26/170  lung]
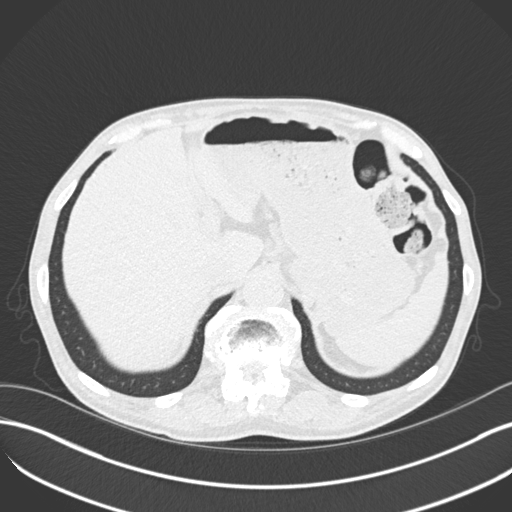
[im 38/170  lung]
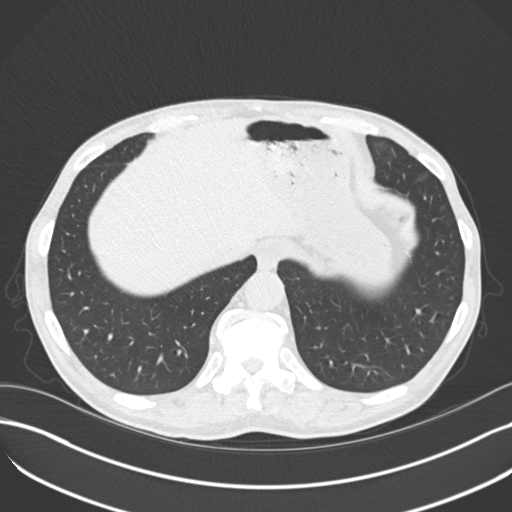
[im 51/170  lung]
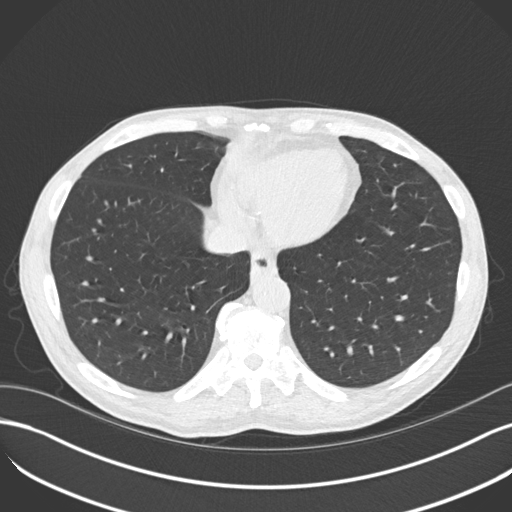
[im 63/170  mediastinal]
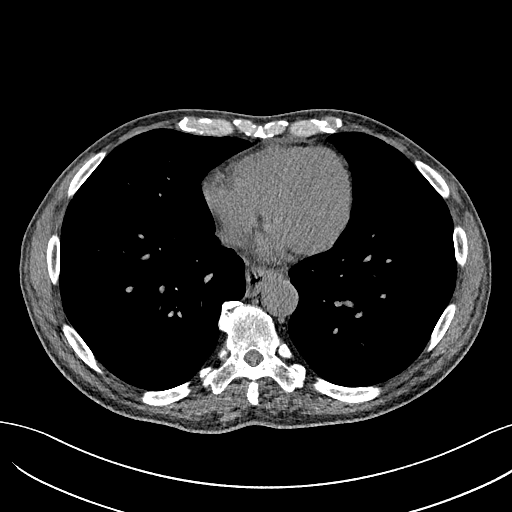
[im 63/170  lung]
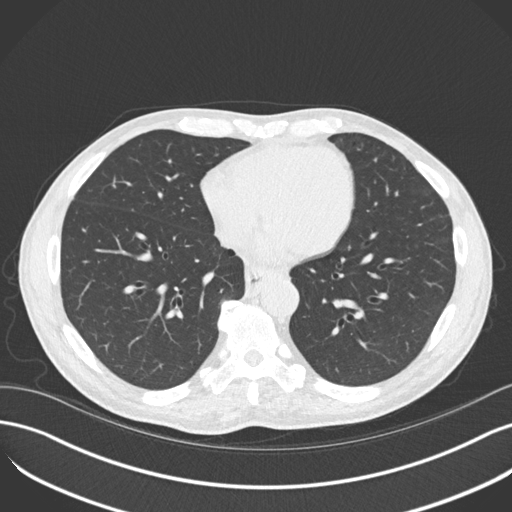
[im 76/170  lung]
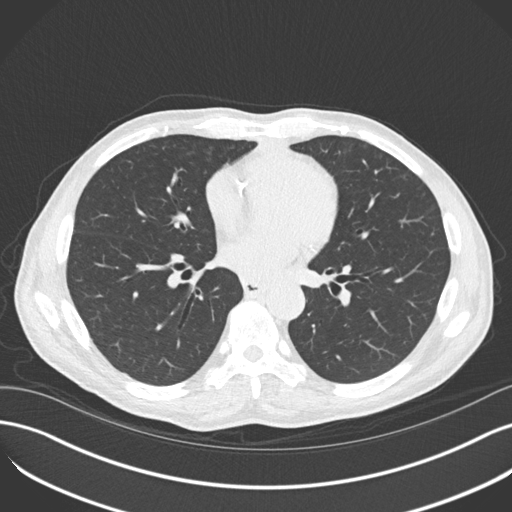
[im 94/170  lung]
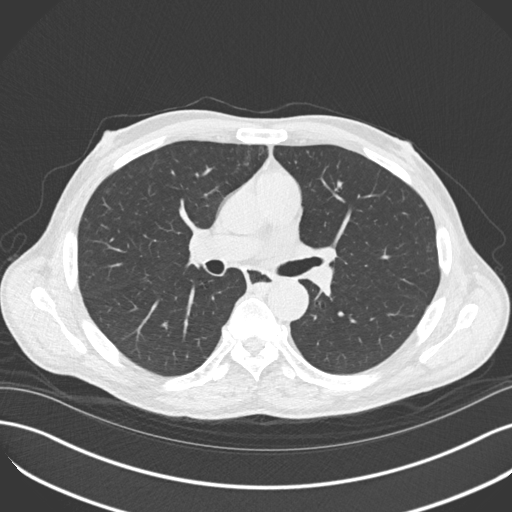
[im 107/170  lung]
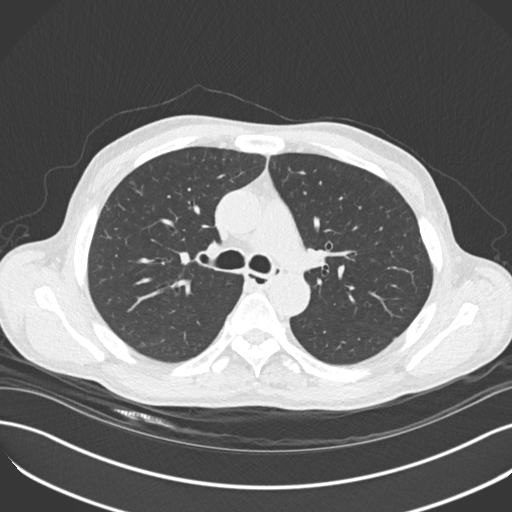
[im 119/170  mediastinal]
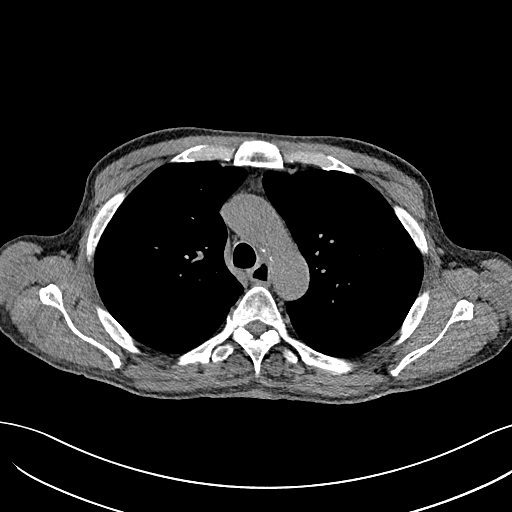
[im 119/170  lung]
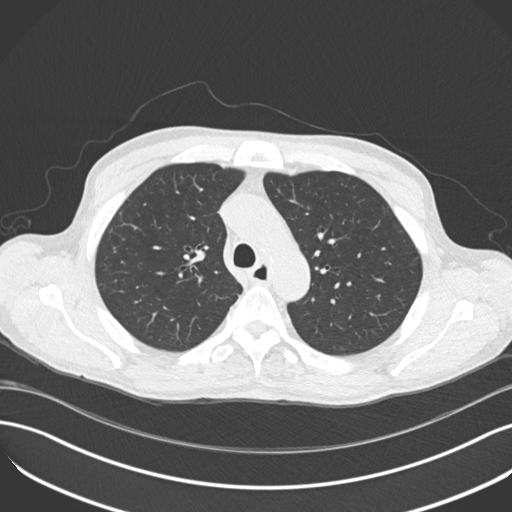
[im 132/170  lung]
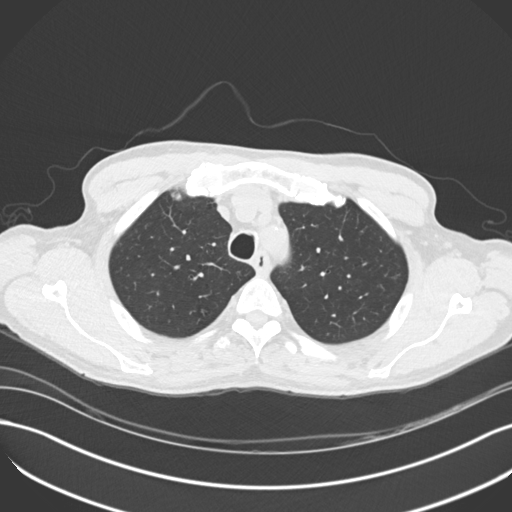
[im 144/170  lung]
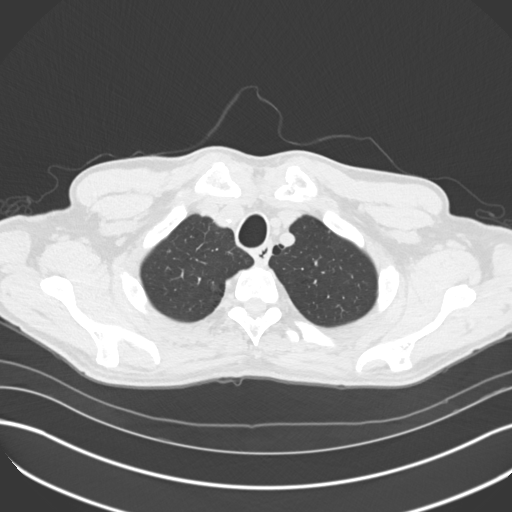
[im 157/170  lung]
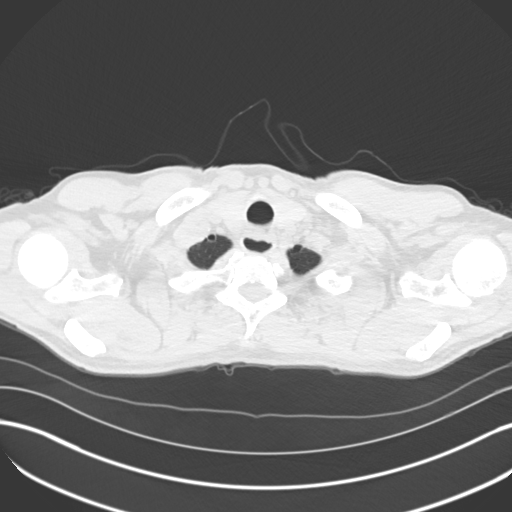

[Series 5: coronal · coronal · 0.66mm/px · 3 of 117 slices shown]
[im 24/117  lung]
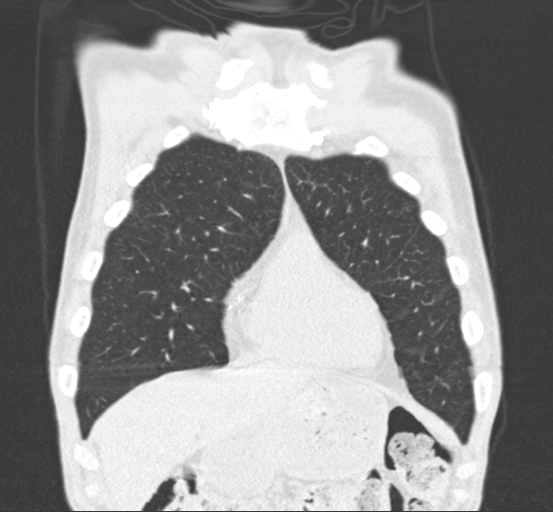
[im 47/117  lung]
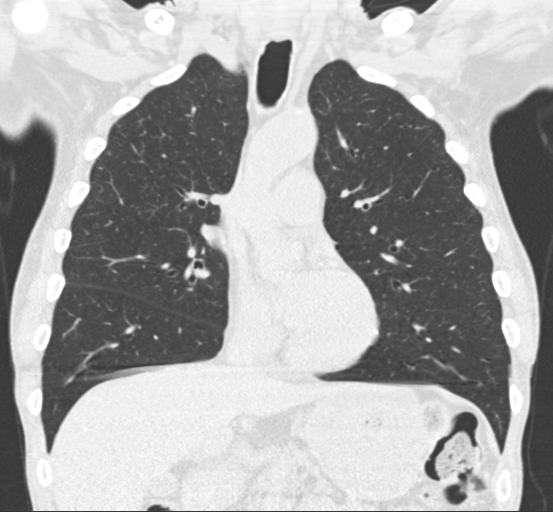
[im 70/117  lung]
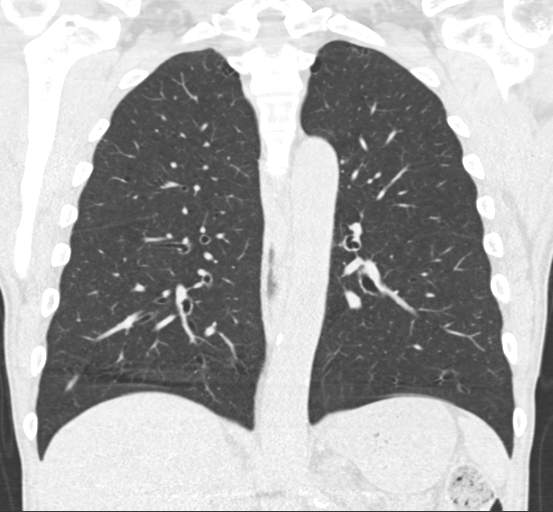

[15 of 36 positions shown; findings below may reference images not displayed]

FINDINGS: Cardiovascular: Coronary artery calcification and aortic
atherosclerotic calcification.

Mediastinum/Nodes: No axillary supraclavicular adenopathy. No
mediastinal adenopathy. There is thickening distal esophagus which
is mild circumferential measuring approximately 5 mm in single wall
thickness (image 113/2). This finding is similar to comparison exam.

Lungs/Pleura: Persistent ground-glass nodule in the lateral aspect
of the RIGHT lower lobe measures 14 x 14 mm compared with 18 x 13
mm. Visually the lesion appears very similar. No new nodules are
present. Airways normal.

Upper Abdomen: Limited view of the liver, kidneys, pancreas are
unremarkable. LEFT adrenal adenoma

Musculoskeletal: Posterior lumbar fusion no aggressive osseous
lesion
IMPRESSION: L1 persistent ground-glass nodule in the RIGHT lower lobe.

1. Persistent ground-glass nodule in the RIGHT lower lobe. Per
consensus recommendation, follow-up CT without contrast in 24 months
from current month. This recommendation follows the consensus
statement: Guidelines for Management of Incidental Pulmonary Nodules
Detected on CT Images: From the [HOSPITAL] 7313; Radiology
7313; [DATE].
2. Circumferential thickening of the distal esophagus suggests
esophagitis. Recommend clinical correlation.

## 2019-11-22 MED FILL — metFORMIN HCL ER 500 MG TB2: 500 | 60 days supply | Qty: 120 | Fill #2

## 2019-11-22 MED FILL — JANUVIA 100 MG TABLET: 100 | 60 days supply | Qty: 60 | Fill #2

## 2019-11-22 MED FILL — LIOTHYRONINE SODIUM 5 MCG T: 5 | 30 days supply | Qty: 60 | Fill #1

## 2019-11-28 DIAGNOSIS — E1165 Type 2 diabetes mellitus with hyperglycemia: Secondary | ICD-10-CM | POA: Diagnosis not present

## 2019-11-28 DIAGNOSIS — E7800 Pure hypercholesterolemia, unspecified: Secondary | ICD-10-CM | POA: Diagnosis not present

## 2019-11-28 DIAGNOSIS — K3184 Gastroparesis: Secondary | ICD-10-CM | POA: Diagnosis not present

## 2019-11-28 DIAGNOSIS — Z9119 Patient's noncompliance with other medical treatment and regimen: Secondary | ICD-10-CM | POA: Diagnosis not present

## 2019-11-28 DIAGNOSIS — I10 Essential (primary) hypertension: Secondary | ICD-10-CM | POA: Diagnosis not present

## 2019-11-28 NOTE — Progress Notes (Signed)
He has chronic severe pain syndrome and could not wait for me. I was running late although not disturbingly so. He was accompanied by his wife.

## 2019-11-29 ENCOUNTER — Other Ambulatory Visit: Payer: Self-pay

## 2019-11-29 ENCOUNTER — Encounter: Payer: Self-pay | Admitting: Interventional Cardiology

## 2019-11-29 ENCOUNTER — Ambulatory Visit (INDEPENDENT_AMBULATORY_CARE_PROVIDER_SITE_OTHER): Payer: 59 | Admitting: Interventional Cardiology

## 2019-11-29 VITALS — BP 136/68 | HR 73 | Ht 74.0 in | Wt 165.6 lb

## 2019-11-29 DIAGNOSIS — I25118 Atherosclerotic heart disease of native coronary artery with other forms of angina pectoris: Secondary | ICD-10-CM | POA: Diagnosis not present

## 2019-11-29 DIAGNOSIS — I10 Essential (primary) hypertension: Secondary | ICD-10-CM

## 2019-11-29 DIAGNOSIS — Z7189 Other specified counseling: Secondary | ICD-10-CM

## 2019-11-29 DIAGNOSIS — E785 Hyperlipidemia, unspecified: Secondary | ICD-10-CM

## 2019-11-29 DIAGNOSIS — E1100 Type 2 diabetes mellitus with hyperosmolarity without nonketotic hyperglycemic-hyperosmolar coma (NKHHC): Secondary | ICD-10-CM

## 2019-12-06 MED FILL — traZODone HCL 50 MG TABS: 50 | 90 days supply | Qty: 180 | Fill #1

## 2019-12-07 NOTE — Progress Notes (Signed)
Cardiology Office Note:    Date:  12/08/2019   ID:  Justin Key, DOB [birth year], MRN [id number]  PCP:  Kathyrn Lass, MD  Cardiologist:  Sinclair Grooms, MD   Referring MD: Kathyrn Lass, MD   Chief Complaint  Patient presents with  . Coronary Artery Disease  . Advice Only    Peripheral/pedal edema    History of Present Illness:    Justin Key is a 69 y.o. male with a hx of CAD with prior myocardial infarction 1997 treated with angioplasty. Long-standing history of type 2 diabetes, hypertension, chronic pain syndrome related to lumbar disc disease, and hyperlipidemia.Recent cath with significant CAD treated medically.  Justin Key has no cardiac complaints.  He is having a difficult time eating.  He is losing weight and basically appears emaciated.  They are concerned that dorsal pedal edema is related to heart failure.  He is not short of breath.  He is not having chest pain.  There is no orthopnea, PND, or nitroglycerin use.  He was in the office 1 week ago, left before I could see him because of severe pain syndrome.  He uses lighted Derm, no opiates, and has near continuous nausea, vomiting, and early satiety.  Past Medical History:  Diagnosis Date  . Back pain   . Coronary artery disease   . Diabetes mellitus without complication ([place])   . Failure to thrive in adult   . GERD (gastroesophageal reflux disease)   . Myocardial infarction ([place]) 1997    Past Surgical History:  Procedure Laterality Date  . BACK SURGERY     microdiskectomy x2  . BIOPSY  06/21/2018   Procedure: BIOPSY;  Surgeon: Wilford Corner, MD;  Location: [facility];  Service: Endoscopy;;  . COLONOSCOPY  07/12/2012   Procedure: COLONOSCOPY;  Surgeon: Lear Ng, MD;  Location: [facility];  Service: Endoscopy;  Laterality: N/A;  . COLONOSCOPY WITH PROPOFOL N/A 06/21/2018   Procedure: COLONOSCOPY WITH PROPOFOL;  Surgeon: Wilford Corner, MD;  Location: [facility];  Service:  Endoscopy;  Laterality: N/A;  . ESOPHAGEAL MANOMETRY N/A 11/15/2017   Procedure: ESOPHAGEAL MANOMETRY (EM);  Surgeon: Wilford Corner, MD;  Location: [facility];  Service: Endoscopy;  Laterality: N/A;  . ESOPHAGOGASTRODUODENOSCOPY  07/12/2012   Procedure: ESOPHAGOGASTRODUODENOSCOPY (EGD);  Surgeon: Lear Ng, MD;  Location: [facility];  Service: Endoscopy;  Laterality: N/A;  . ESOPHAGOGASTRODUODENOSCOPY (EGD) WITH PROPOFOL N/A 06/21/2018   Procedure: ESOPHAGOGASTRODUODENOSCOPY (EGD) WITH PROPOFOL;  Surgeon: Wilford Corner, MD;  Location: [facility];  Service: Endoscopy;  Laterality: N/A;  . INTRAVASCULAR ULTRASOUND/IVUS N/A 08/24/2018   Procedure: Intravascular Ultrasound/IVUS;  Surgeon: Belva Crome, MD;  Location: [facility];  Service: Cardiovascular;  Laterality: N/A;  . KNEE ARTHROSCOPY  1988   Left  . LEFT HEART CATH AND CORONARY ANGIOGRAPHY N/A 08/24/2018   Procedure: LEFT HEART CATH AND CORONARY ANGIOGRAPHY;  Surgeon: Belva Crome, MD;  Location: [facility];  Service: Cardiovascular;  Laterality: N/A;  . POLYPECTOMY  06/21/2018   Procedure: POLYPECTOMY;  Surgeon: Wilford Corner, MD;  Location: [facility];  Service: Endoscopy;;  . SPINAL CORD STIMULATOR IMPLANT    . SPINAL CORD STIMULATOR REMOVAL    . SPINAL FUSION     x 3  . THORACIC DISCECTOMY N/A 03/15/2019   Procedure: Laminectomy and Foraminotomy - Thoracic eleven -twelve;  Surgeon: Kary Kos, MD;  Location: [facility];  Service: Neurosurgery;  Laterality: N/A;    Current Medications: Current Meds  Medication Sig  .  aspirin EC 81 MG tablet Take 1 tablet (81 mg total) by mouth daily.  Marland Kitchen docusate sodium (COLACE) 100 MG capsule Take 100 mg by mouth 2 (two) times daily.  Marland Kitchen glimepiride (AMARYL) 4 MG tablet Take 8 mg by mouth daily with breakfast.   . ibuprofen (ADVIL,MOTRIN) 200 MG tablet Take 800 mg by mouth every 8 (eight) hours as needed for moderate pain.   Marland Kitchen JANUVIA 100 MG tablet Take 100  mg daily by mouth.  . lidocaine (LIDODERM) 5 % Place 1 patch onto the skin daily as needed (for back pain.).   . Magnesium 250 MG TABS Take 250 mg by mouth daily.  Marland Kitchen MELATONIN PO Take 12 mg by mouth at bedtime as needed (for sleep).  . metFORMIN (GLUCOPHAGE-XR) 500 MG 24 hr tablet Take 500 mg by mouth 2 (two) times daily.  . metoprolol succinate (TOPROL-XL) 25 MG 24 hr tablet Take 1 tablet (25 mg total) by mouth daily. Please make yearly appt with Dr. Tamala Julian for January before anymore refills. 1st attempt  . nitroGLYCERIN (NITROSTAT) 0.4 MG SL tablet Place 1 tablet (0.4 mg total) under the tongue every 5 (five) minutes as needed for chest pain.  . pantoprazole (PROTONIX) 40 MG tablet Take 40 mg by mouth 2 (two) times daily.  Marland Kitchen Respiratory Therapy Supplies (FLUTTER) DEVI Use as directed  . traZODone (DESYREL) 50 MG tablet Take 50-100 mg by mouth at bedtime as needed for sleep.     Allergies:   Humibid la [guaifenesin]   Social History   Socioeconomic History  . Marital status: Married    Spouse name: Not on file  . Number of children: Not on file  . Years of education: Not on file  . Highest education level: Not on file  Occupational History  . Not on file  Tobacco Use  . Smoking status: Former Smoker    Packs/day: 1.00    Years: 20.00    Pack years: 20.00    Types: Cigarettes    Quit date: 04/09/2012    Years since quitting: 7.6  . Smokeless tobacco: Never Used  . Tobacco comment: smoking THC for pain/appetite   Substance and Sexual Activity  . Alcohol use: Not Currently  . Drug use: Yes    Types: Marijuana    Comment: twice daily  . Sexual activity: Yes    Birth control/protection: None  Other Topics Concern  . Not on file  Social History Narrative  . Not on file   Social Determinants of Health   Financial Resource Strain:   . Difficulty of Paying Living Expenses:   Food Insecurity:   . Worried About Charity fundraiser in the Last Year:   . Arboriculturist in the  Last Year:   Transportation Needs:   . Film/video editor (Medical):   Marland Kitchen Lack of Transportation (Non-Medical):   Physical Activity:   . Days of Exercise per Week:   . Minutes of Exercise per Session:   Stress:   . Feeling of Stress :   Social Connections:   . Frequency of Communication with Friends and Family:   . Frequency of Social Gatherings with Friends and Family:   . Attends Religious Services:   . Active Member of Clubs or Organizations:   . Attends Archivist Meetings:   Marland Kitchen Marital Status:      Family History: The patient's family history is not on file.  ROS:   Please see the history of present  illness.    Had back surgery last June.  Still in significant back pain and says that surgery did not help.  He is to have upper GI and colon endoscopy performed but late last year he was unable to get scheduled because of the pandemic.  I did inform them that the GI endoscopy clinic is again open and that they should call again to schedule.  All other systems reviewed and are negative.  EKGs/Labs/Other Studies Reviewed:    The following studies were reviewed today:  Cardiac catheterization 08/24/2018:  Diagnostic Dominance: Right    EKG:  EKG rhythm, prominent voltage, question left ventricular hypertrophy, and PVCs.  This tracing was performed on 11/30/2019 on aborted office clinic when he developed severe back pain and could not wait for me to see him.  Recent Labs: 03/08/2019: BUN 21; Creatinine, Ser 1.08; Hemoglobin 13.9; Platelets 207; Potassium 3.9; Sodium 138  Recent Lipid Panel    Component Value Date/Time   CHOL  12/18/2008 0315    179        ATP III CLASSIFICATION:  <200     mg/dL   Desirable  200-239  mg/dL   Borderline High  >=240    mg/dL   High          TRIG 64 12/18/2008 0315   HDL 42 12/18/2008 0315   CHOLHDL 4.3 12/18/2008 0315   VLDL 13 12/18/2008 0315   LDLCALC (H) 12/18/2008 0315    124        Total Cholesterol/HDL:CHD  Risk Coronary Heart Disease Risk Table                     Men   Women  1/2 Average Risk   3.4   3.3  Average Risk       5.0   4.4  2 X Average Risk   9.6   7.1  3 X Average Risk  23.4   11.0        Use the calculated Patient Ratio above and the CHD Risk Table to determine the patient's CHD Risk.        ATP III CLASSIFICATION (LDL):  <100     mg/dL   Optimal  100-129  mg/dL   Near or Above                    Optimal  130-159  mg/dL   Borderline  160-189  mg/dL   High  >190     mg/dL   Very High    Physical Exam:    VS:  BP 124/60   Pulse 83   Ht 6\' 2"  (1.88 m)   Wt 164 lb 6.4 oz (74.6 kg)   SpO2 99%   BMI 21.11 kg/m     Wt Readings from Last 3 Encounters:  12/08/19 164 lb 6.4 oz (74.6 kg)  11/29/19 165 lb 9.6 oz (75.1 kg)  06/14/19 163 lb (73.9 kg)     GEN: Emaciated appearing including a look of frailty.. No acute distress HEENT: Normal NECK: No JVD. LYMPHATICS: No lymphadenopathy CARDIAC:  RRR without murmur, gallop.  He has trace dorsal pedal edema in both feet.Marland Kitchen VASCULAR:  Normal Pulses. No bruits. RESPIRATORY:  Clear to auscultation without rales, wheezing or rhonchi  ABDOMEN: Soft, non-tender, non-distended, No pulsatile mass, MUSCULOSKELETAL: No deformity  SKIN: Warm and dry NEUROLOGIC:  Alert and oriented x 3 PSYCHIATRIC:  Normal affect   ASSESSMENT:    1. Coronary artery  disease of native artery of native heart with stable angina pectoris ([place])   2. Essential hypertension   3. Type 2 diabetes mellitus with hyperosmolarity without coma, without long-term current use of insulin ([place])   4. Hyperlipidemia LDL goal <70   5. Educated about COVID-19 virus infection    PLAN:    In order of problems listed above:  1. Not having angina or heart failure symptoms for that matter.  No clinical evidence of volume overload.  Minimal if any anginal complaints.  I do not believe his heart has anything to do with the pedal edema.  It is more likely related to low  albumin or some other mechanism.  I offered blood work but he was resistant.  I would consider hemoglobin and comprehensive metabolic panel if he would allow. 2. Blood pressure is lowish for the patient.  He should continue Toprol-XL 3. He probably also needs to have a hemoglobin A1c.  He has not been seeing his primary physician, Dr. Kathyrn Lass. 4. He continues on his therapy for diabetes.  He is not on a statin. 5. He is adamant against the COVID-19 vaccine until "the experiment is completed" which means that he wants to see how other people do before he takes the vaccine.  His wife is taken the vaccine.   Medication Adjustments/Labs and Tests Ordered: Current medicines are reviewed at length with the patient today.  Concerns regarding medicines are outlined above.  No orders of the defined types were placed in this encounter.  No orders of the defined types were placed in this encounter.   Patient Instructions  Medication Instructions:  Your physician recommends that you continue on your current medications as directed. Please refer to the Current Medication list given to you today.  *If you need a refill on your cardiac medications before your next appointment, please call your pharmacy*   Lab Work: None If you have labs (blood work) drawn today and your tests are completely normal, you will receive your results only by: Marland Kitchen MyChart Message (if you have MyChart) OR . A paper copy in the mail If you have any lab test that is abnormal or we need to change your treatment, we will call you to review the results.   Testing/Procedures: None   Follow-Up: At [facility], you and your health needs are our priority.  As part of our continuing mission to provide you with exceptional heart care, we have created designated Provider Care Teams.  These Care Teams include your primary Cardiologist (physician) and Advanced Practice Providers (APPs -  Physician Assistants and Nurse  Practitioners) who all work together to provide you with the care you need, when you need it.  We recommend signing up for the patient portal called "MyChart".  Sign up information is provided on this After Visit Summary.  MyChart is used to connect with patients for Virtual Visits (Telemedicine).  Patients are able to view lab/test results, encounter notes, upcoming appointments, etc.  Non-urgent messages can be sent to your provider as well.   To learn more about what you can do with MyChart, go to [email or web address].    Your next appointment:   12 month(s)  The format for your next appointment:   In Person  Provider:   You may see Sinclair Grooms, MD or one of the following Advanced Practice Providers on your designated Care Team:    Truitt Merle, NP  Cecilie Kicks, NP  Kathyrn Drown, NP  Other Instructions      Signed, Sinclair Grooms, MD  12/08/2019 2:56 PM    [facility]

## 2019-12-08 ENCOUNTER — Other Ambulatory Visit: Payer: Self-pay

## 2019-12-08 ENCOUNTER — Ambulatory Visit (INDEPENDENT_AMBULATORY_CARE_PROVIDER_SITE_OTHER): Payer: 59 | Admitting: Interventional Cardiology

## 2019-12-08 ENCOUNTER — Encounter: Payer: Self-pay | Admitting: Interventional Cardiology

## 2019-12-08 VITALS — BP 124/60 | HR 83 | Ht 74.0 in | Wt 164.4 lb

## 2019-12-08 DIAGNOSIS — I25118 Atherosclerotic heart disease of native coronary artery with other forms of angina pectoris: Secondary | ICD-10-CM

## 2019-12-08 DIAGNOSIS — E1100 Type 2 diabetes mellitus with hyperosmolarity without nonketotic hyperglycemic-hyperosmolar coma (NKHHC): Secondary | ICD-10-CM | POA: Diagnosis not present

## 2019-12-08 DIAGNOSIS — I10 Essential (primary) hypertension: Secondary | ICD-10-CM

## 2019-12-08 DIAGNOSIS — E785 Hyperlipidemia, unspecified: Secondary | ICD-10-CM

## 2019-12-08 DIAGNOSIS — Z7189 Other specified counseling: Secondary | ICD-10-CM | POA: Diagnosis not present

## 2019-12-08 NOTE — Patient Instructions (Signed)

## 2019-12-28 DIAGNOSIS — R634 Abnormal weight loss: Secondary | ICD-10-CM | POA: Diagnosis not present

## 2019-12-28 DIAGNOSIS — R609 Edema, unspecified: Secondary | ICD-10-CM | POA: Diagnosis not present

## 2019-12-28 MED FILL — MEGESTROL 20 MG TABLET: 20 | 30 days supply | Qty: 60 | Fill #0

## 2019-12-30 ENCOUNTER — Other Ambulatory Visit: Payer: Self-pay

## 2019-12-30 ENCOUNTER — Emergency Department (HOSPITAL_COMMUNITY)
Admission: EM | Admit: 2019-12-30 | Discharge: 2019-12-30 | Disposition: A | Discharge status: Home / Self Care | Payer: 59 | Attending: Emergency Medicine | Admitting: Emergency Medicine

## 2019-12-30 ENCOUNTER — Encounter (HOSPITAL_COMMUNITY): Payer: Self-pay | Admitting: *Deleted

## 2019-12-30 ENCOUNTER — Emergency Department (HOSPITAL_COMMUNITY): Payer: 59

## 2019-12-30 DIAGNOSIS — Z7984 Long term (current) use of oral hypoglycemic drugs: Secondary | ICD-10-CM | POA: Diagnosis not present

## 2019-12-30 DIAGNOSIS — Z79899 Other long term (current) drug therapy: Secondary | ICD-10-CM | POA: Diagnosis not present

## 2019-12-30 DIAGNOSIS — G8929 Other chronic pain: Secondary | ICD-10-CM

## 2019-12-30 DIAGNOSIS — R112 Nausea with vomiting, unspecified: Secondary | ICD-10-CM | POA: Insufficient documentation

## 2019-12-30 DIAGNOSIS — R0989 Other specified symptoms and signs involving the circulatory and respiratory systems: Secondary | ICD-10-CM | POA: Diagnosis not present

## 2019-12-30 DIAGNOSIS — Z7982 Long term (current) use of aspirin: Secondary | ICD-10-CM | POA: Diagnosis not present

## 2019-12-30 DIAGNOSIS — E119 Type 2 diabetes mellitus without complications: Secondary | ICD-10-CM | POA: Insufficient documentation

## 2019-12-30 DIAGNOSIS — R1110 Vomiting, unspecified: Secondary | ICD-10-CM | POA: Diagnosis not present

## 2019-12-30 DIAGNOSIS — I10 Essential (primary) hypertension: Secondary | ICD-10-CM | POA: Insufficient documentation

## 2019-12-30 DIAGNOSIS — I2510 Atherosclerotic heart disease of native coronary artery without angina pectoris: Secondary | ICD-10-CM | POA: Insufficient documentation

## 2019-12-30 DIAGNOSIS — R1031 Right lower quadrant pain: Secondary | ICD-10-CM | POA: Insufficient documentation

## 2019-12-30 DIAGNOSIS — R110 Nausea: Secondary | ICD-10-CM

## 2019-12-30 LAB — CBC WITH DIFFERENTIAL/PLATELET
Abs Immature Granulocytes: 0.03 10*3/uL (ref 0.00–0.07)
Basophils Absolute: 0 10*3/uL (ref 0.0–0.1)
Basophils Relative: 0 %
Eosinophils Absolute: 0 10*3/uL (ref 0.0–0.5)
Eosinophils Relative: 0 %
HCT: 45.9 % (ref 39.0–52.0)
Hemoglobin: 15.5 g/dL (ref 13.0–17.0)
Immature Granulocytes: 0 %
Lymphocytes Relative: 19 %
Lymphs Abs: 2.1 10*3/uL (ref 0.7–4.0)
MCH: 29.6 pg (ref 26.0–34.0)
MCHC: 33.8 g/dL (ref 30.0–36.0)
MCV: 87.8 fL (ref 80.0–100.0)
Monocytes Absolute: 0.8 10*3/uL (ref 0.1–1.0)
Monocytes Relative: 8 %
Neutro Abs: 7.7 10*3/uL (ref 1.7–7.7)
Neutrophils Relative %: 73 %
Platelets: 243 10*3/uL (ref 150–400)
RBC: 5.23 MIL/uL (ref 4.22–5.81)
RDW: 13.4 % (ref 11.5–15.5)
WBC: 10.7 10*3/uL — ABNORMAL HIGH (ref 4.0–10.5)
nRBC: 0 % (ref 0.0–0.2)

## 2019-12-30 LAB — URINALYSIS, ROUTINE W REFLEX MICROSCOPIC
Bacteria, UA: NONE SEEN
Bilirubin Urine: NEGATIVE
Glucose, UA: >=500 mg/dL — AB
Hgb urine dipstick: NEGATIVE
Ketones, ur: NEGATIVE mg/dL
Leukocytes,Ua: NEGATIVE
Nitrite: NEGATIVE
Protein, ur: 100 mg/dL — AB
Specific Gravity, Urine: 1.021 (ref 1.005–1.030)
pH: 6 (ref 5.0–8.0)

## 2019-12-30 LAB — COMPREHENSIVE METABOLIC PANEL
ALT: 39 U/L (ref 0–44)
AST: 37 U/L (ref 15–41)
Albumin: 4.4 g/dL (ref 3.5–5.0)
Alkaline Phosphatase: 73 U/L (ref 38–126)
Anion gap: 12 (ref 5–15)
BUN: 19 mg/dL (ref 8–23)
CO2: 26 mmol/L (ref 22–32)
Calcium: 10 mg/dL (ref 8.9–10.3)
Chloride: 100 mmol/L (ref 98–111)
Creatinine, Ser: 1.05 mg/dL (ref 0.61–1.24)
GFR calc Af Amer: >60 mL/min (ref >60)
GFR calc non Af Amer: >60 mL/min (ref >60)
Glucose, Bld: 264 mg/dL — ABNORMAL HIGH (ref 70–99)
Potassium: 3.9 mmol/L (ref 3.5–5.1)
Sodium: 138 mmol/L (ref 135–145)
Total Bilirubin: 0.6 mg/dL (ref 0.3–1.2)
Total Protein: 8.4 g/dL — ABNORMAL HIGH (ref 6.5–8.1)

## 2019-12-30 LAB — LIPASE, BLOOD: Lipase: 65 U/L — ABNORMAL HIGH (ref 11–51)

## 2019-12-30 MED ORDER — PROMETHAZINE HCL 12.5 MG PO TABS: 12.5000 mg | ORAL_TABLET | Freq: Four times a day (QID) | ORAL | 0 refills | Status: DC | PRN

## 2019-12-30 MED ORDER — METOCLOPRAMIDE HCL 10 MG PO TABS: 10.0000 mg | ORAL_TABLET | Freq: Three times a day (TID) | ORAL | 0 refills | Status: DC | PRN

## 2019-12-30 MED ORDER — ONDANSETRON HCL 4 MG/2ML IJ SOLN
4.0000 mg | Freq: Once | INTRAMUSCULAR | Status: AC
  Administered 2019-12-30: 4 mg via INTRAVENOUS
  Filled 2019-12-30: qty 2

## 2019-12-30 MED ORDER — METOCLOPRAMIDE HCL 5 MG/ML IJ SOLN
10.0000 mg | Freq: Once | INTRAMUSCULAR | Status: AC
  Administered 2019-12-30: 10 mg via INTRAVENOUS
  Filled 2019-12-30: qty 2

## 2019-12-30 MED ORDER — SODIUM CHLORIDE (PF) 0.9 % IJ SOLN
INTRAMUSCULAR | Status: AC
  Filled 2019-12-30: qty 50

## 2019-12-30 MED ORDER — IOHEXOL 300 MG/ML  SOLN
100.0000 mL | Freq: Once | INTRAMUSCULAR | Status: AC | PRN
  Administered 2019-12-30: 100 mL via INTRAVENOUS

## 2019-12-30 MED ORDER — LORAZEPAM 2 MG/ML IJ SOLN
1.0000 mg | Freq: Once | INTRAMUSCULAR | Status: AC
  Administered 2019-12-30: 15:00:00 1 mg via INTRAVENOUS
  Filled 2019-12-30: qty 1

## 2019-12-30 NOTE — ED Provider Notes (Signed)
[facility] DEPT Provider Note   CSN: [id number] Arrival date & time: 12/30/19  [id number]     History Chief Complaint  Patient presents with  . Nausea  . Abdominal Pain    Justin Key is a 69 y.o. male with CAD, Type 2 DM, HTN, chronic back pain who presents with N/V.  Over the past 3 days he has had intractable nausea and vomiting with liquids and solids.  He has been trying to eat soft foods such as oatmeal and soup but within 30 minutes he will have episodes of emesis.  He states that he has not been able to sleep because he will vomit throughout the night.  He reports difficulty eating for years.  Its been going on since approximately 2018.  He used to weigh 220 pounds and is currently around 168 pounds.  He also reports issues with chronic constipation.  He states he has had 6 lower back surgeries and was on oxycodone for years but even after coming off oxycodone he has continued to have issues with constipation.  His last bowel movement was several days ago and he describes it as passing small balls.  He takes Dulcolax for constipation without significant relief.  Prior chart review he had an EGD with Dr. Cari Caraway in 2019 which did not show any significant abnormality.  He has an appointment coming up with GI but is unsure of when that will be.  He is also been diagnosed with probable gastroparesis by his endocrinologist.  Has not tried Reglan.  He feels weak and lightheaded at times but has not passed out.  He also is reporting some right lower quadrant abdominal pain which is intermittent and has been going on for months.  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 Pt is a retired Marine scientist. He denies fever, chills. He has left sided chest pain with vomiting and problems with chest congestion for about a year. He is a former cigarette smoker. He has been smoking marijuana to help with his chronic nausea, pain, and  GI issues and states that sometimes it helps and sometimes it doesn't   HPI     Past Medical History:  Diagnosis Date  . Back pain   . Coronary artery disease   . Diabetes mellitus without complication ([place])   . Failure to thrive in adult   . GERD (gastroesophageal reflux disease)   . Myocardial infarction [facility]) 1997    Patient Active Problem List   Diagnosis Date Noted  . Upper airway cough syndrome 06/15/2019  . Myelopathy ([place]) 03/15/2019  . CAD (coronary artery disease), native coronary artery 08/24/2018  . Angina pectoris ([place]) 08/24/2018  . GERD (gastroesophageal reflux disease) 06/21/2018  . Personal history of colonic polyps 06/21/2018  . Pulmonary infiltrate present on computed tomography 10/12/2017    Past Surgical History:  Procedure Laterality Date  . BACK SURGERY     microdiskectomy x2  . BIOPSY  06/21/2018   Procedure: BIOPSY;  Surgeon: Wilford Corner, MD;  Location: [facility];  Service: Endoscopy;;  . COLONOSCOPY  07/12/2012   Procedure: COLONOSCOPY;  Surgeon: Lear Ng, MD;  Location: [facility];  Service: Endoscopy;  Laterality: N/A;  . COLONOSCOPY WITH PROPOFOL N/A 06/21/2018   Procedure: COLONOSCOPY WITH PROPOFOL;  Surgeon: Wilford Corner, MD;  Location: [facility];  Service: Endoscopy;  Laterality: N/A;  . ESOPHAGEAL MANOMETRY N/A 11/15/2017   Procedure: ESOPHAGEAL MANOMETRY (EM);  Surgeon: Michail Sermon,  Evette Doffing, MD;  Location: [facility];  Service: Endoscopy;  Laterality: N/A;  . ESOPHAGOGASTRODUODENOSCOPY  07/12/2012   Procedure: ESOPHAGOGASTRODUODENOSCOPY (EGD);  Surgeon: Lear Ng, MD;  Location: [facility];  Service: Endoscopy;  Laterality: N/A;  . ESOPHAGOGASTRODUODENOSCOPY (EGD) WITH PROPOFOL N/A 06/21/2018   Procedure: ESOPHAGOGASTRODUODENOSCOPY (EGD) WITH PROPOFOL;  Surgeon: Wilford Corner, MD;  Location: [facility];  Service: Endoscopy;  Laterality: N/A;  . INTRAVASCULAR ULTRASOUND/IVUS N/A 08/24/2018    Procedure: Intravascular Ultrasound/IVUS;  Surgeon: Belva Crome, MD;  Location: [facility];  Service: Cardiovascular;  Laterality: N/A;  . KNEE ARTHROSCOPY  1988   Left  . LEFT HEART CATH AND CORONARY ANGIOGRAPHY N/A 08/24/2018   Procedure: LEFT HEART CATH AND CORONARY ANGIOGRAPHY;  Surgeon: Belva Crome, MD;  Location: [facility];  Service: Cardiovascular;  Laterality: N/A;  . POLYPECTOMY  06/21/2018   Procedure: POLYPECTOMY;  Surgeon: Wilford Corner, MD;  Location: [facility];  Service: Endoscopy;;  . SPINAL CORD STIMULATOR IMPLANT    . SPINAL CORD STIMULATOR REMOVAL    . SPINAL FUSION     x 3  . THORACIC DISCECTOMY N/A 03/15/2019   Procedure: Laminectomy and Foraminotomy - Thoracic eleven -twelve;  Surgeon: Kary Kos, MD;  Location: [place] [facility];  Service: Neurosurgery;  Laterality: N/A;       No family history on file.  Social History   Tobacco Use  . Smoking status: Former Smoker    Packs/day: 1.00    Years: 20.00    Pack years: 20.00    Types: Cigarettes    Quit date: 04/09/2012    Years since quitting: 7.7  . Smokeless tobacco: Never Used  . Tobacco comment: smoking THC for pain/appetite   Substance Use Topics  . Alcohol use: Not Currently  . Drug use: Yes    Types: Marijuana    Comment: twice daily    Home Medications Prior to Admission medications   Medication Sig Start Date End Date Taking? Authorizing Provider  aspirin EC 81 MG tablet Take 1 tablet (81 mg total) by mouth daily. 10/18/18   Belva Crome, MD  docusate sodium (COLACE) 100 MG capsule Take 100 mg by mouth 2 (two) times daily.    [provider]  glimepiride (AMARYL) 4 MG tablet Take 8 mg by mouth daily with breakfast.     [provider]  ibuprofen (ADVIL,MOTRIN) 200 MG tablet Take 800 mg by mouth every 8 (eight) hours as needed for moderate pain.     [provider]  JANUVIA 100 MG tablet Take 100 mg daily by mouth. 07/21/17   [provider]    lidocaine (LIDODERM) 5 % Place 1 patch onto the skin daily as needed (for back pain.).  02/13/16   [provider]  Magnesium 250 MG TABS Take 250 mg by mouth daily.    [provider]  MELATONIN PO Take 12 mg by mouth at bedtime as needed (for sleep).    [provider]  metFORMIN (GLUCOPHAGE-XR) 500 MG 24 hr tablet Take 500 mg by mouth 2 (two) times daily. 07/18/18   [provider]  metoprolol succinate (TOPROL-XL) 25 MG 24 hr tablet Take 1 tablet (25 mg total) by mouth daily. Please make yearly appt with Dr. Tamala Julian for January before anymore refills. 1st attempt 08/25/19   Belva Crome, MD  nitroGLYCERIN (NITROSTAT) 0.4 MG SL tablet Place 1 tablet (0.4 mg total) under the tongue every 5 (five) minutes as needed for chest pain.  10/18/18 12/08/19  Belva Crome, MD  pantoprazole (PROTONIX) 40 MG tablet Take 40 mg by mouth 2 (two) times daily.    [provider]  Respiratory Therapy Supplies (FLUTTER) DEVI Use as directed 06/14/19   Tanda Rockers, MD  traZODone (DESYREL) 50 MG tablet Take 50-100 mg by mouth at bedtime as needed for sleep. 06/06/18   [provider]    Allergies    Humibid la [guaifenesin]  Review of Systems   Review of Systems  Constitutional: Negative for chills and fever.  HENT: Positive for congestion.   Respiratory: Negative for shortness of breath.   Cardiovascular: Positive for chest pain (with vomiting).  Gastrointestinal: Positive for abdominal pain, constipation, nausea and vomiting. Negative for diarrhea.  Genitourinary:       +chronic incontinence  Musculoskeletal: Positive for back pain (chronic).  Neurological: Positive for weakness and light-headedness. Negative for syncope.  All other systems reviewed and are negative.   Physical Exam Updated Vital Signs BP 129/64 (BP Location: Right Arm)   Pulse 91   Temp 98.1 F (36.7 C) (Oral)   Resp 18   Ht 6\' 3"  (1.905 m)   Wt 76.2 kg   SpO2 97%   BMI  21.00 kg/m   Physical Exam Vitals and nursing note reviewed.  Constitutional:      General: He is not in acute distress.    Appearance: Normal appearance. He is well-developed. He is not ill-appearing.     Comments: Calm, cooperative. Tearful at times  HENT:     Head: Normocephalic and atraumatic.  Eyes:     General: No scleral icterus.       Right eye: No discharge.        Left eye: No discharge.     Conjunctiva/sclera: Conjunctivae normal.     Pupils: Pupils are equal, round, and reactive to light.     Comments: Dysconjugate gaze  Cardiovascular:     Rate and Rhythm: Normal rate and regular rhythm.  Pulmonary:     Effort: Pulmonary effort is normal. No respiratory distress.     Breath sounds: Wheezing (on left side) present.  Abdominal:     General: There is no distension.     Palpations: Abdomen is soft.     Tenderness: There is no abdominal tenderness.  Musculoskeletal:     Cervical back: Normal range of motion.  Skin:    General: Skin is warm and dry.  Neurological:     Mental Status: He is alert and oriented to person, place, and time.  Psychiatric:        Mood and Affect: Mood normal.        Behavior: Behavior normal.     ED Results / Procedures / Treatments   Labs (all labs ordered are listed, but only abnormal results are displayed) Labs Reviewed  CBC WITH DIFFERENTIAL/PLATELET - Abnormal; Notable for the following components:      Result Value   WBC 10.7 (*)    All other components within normal limits  COMPREHENSIVE METABOLIC PANEL - Abnormal; Notable for the following components:   Glucose, Bld 264 (*)    Total Protein 8.4 (*)    All other components within normal limits  LIPASE, BLOOD - Abnormal; Notable for the following components:   Lipase 65 (*)    All other components within normal limits  URINALYSIS, ROUTINE W REFLEX MICROSCOPIC - Abnormal; Notable for the following components:   Glucose, UA >=500 (*)    Protein, ur  100 (*)    All other  components within normal limits    EKG None  Radiology DG Chest 2 View  Result Date: 12/30/2019 CLINICAL DATA:  Congestion.  Nausea and vomiting for 3 days. EXAM: CHEST - 2 VIEW COMPARISON:  Feb 16, 2019 FINDINGS: Mild anterior wedging of a lower thoracic vertebral body is stable since May of 2020. The heart, hila, and mediastinum are normal. No pneumothorax. No nodules or masses. No focal infiltrates. No overt edema. IMPRESSION: No active cardiopulmonary disease. Electronically Signed   By: Dorise Bullion III M.D   On: 12/30/2019 12:32   CT ABDOMEN PELVIS W CONTRAST  Result Date: 12/30/2019 CLINICAL DATA:  Nausea and vomiting 3 days. Intermittent right lower quadrant pain. EXAM: CT ABDOMEN AND PELVIS WITH CONTRAST TECHNIQUE: Multidetector CT imaging of the abdomen and pelvis was performed using the standard protocol following bolus administration of intravenous contrast. CONTRAST:  163mL OMNIPAQUE IOHEXOL 300 MG/ML  SOLN COMPARISON:  12/03/2018, 04/25/2007 FINDINGS: Lower chest: Subtle 1.1 cm focus of hazy attenuation over the lateral right lower lobe. This may simply represent atelectasis, although infection or developing ground-glass nodule is possible. Possible mild stable wall thickening of the distal esophagus near the gastroesophageal junction likely due to reflux esophagitis. Hepatobiliary: Liver and biliary tree are normal. Gallbladder is normal in size without wall thickening or inflammatory changes. Possible punctate single stone near the gallbladder base. Pancreas: Normal. Spleen: Normal. Adrenals/Urinary Tract: Mild symmetric prominence of the adrenal glands similar to 2008. Kidneys are normal in size and demonstrate multiple bilateral cysts unchanged. No evidence hydronephrosis or nephrolithiasis. Ureters and bladder are unremarkable. Stomach/Bowel: Stomach is empty. Small bowel is unremarkable. Appendix is normal. Colon is unremarkable. Vascular/Lymphatic: Moderate calcified plaque over  the abdominal aorta which is normal in caliber. No adenopathy. Reproductive: Unremarkable. Other: No free fluid or focal inflammatory change. Musculoskeletal: Posterior fusion hardware intact from L1-L5. IMPRESSION: 1.  No acute findings in the abdomen/pelvis. 2.  Multiple bilateral renal cysts unchanged. 3.  Possible single punctate gallstone. 4. Subtle peripheral 1.1 cm focus of hazy/ground glass attenuation over the periphery of the right lower lobe. This may represent atelectasis versus early infection or peripheral ground-glass nodule. Consider follow-up chest CT 6 weeks. 5.  Aortic Atherosclerosis (ICD10-I70.0). Electronically Signed   By: Marin Olp M.D.   On: 12/30/2019 13:28    Procedures Procedures (including critical care time)  Medications Ordered in ED Medications  sodium chloride (PF) 0.9 % injection (has no administration in time range)  metoCLOPramide (REGLAN) injection 10 mg (10 mg Intravenous Given 12/30/19 1148)  iohexol (OMNIPAQUE) 300 MG/ML solution 100 mL (100 mLs Intravenous Contrast Given 12/30/19 1236)  ondansetron (ZOFRAN) injection 4 mg (4 mg Intravenous Given 12/30/19 1346)  LORazepam (ATIVAN) injection 1 mg (1 mg Intravenous Given 12/30/19 1433)    ED Course  I have reviewed the triage vital signs and the nursing notes.  Pertinent labs & imaging results that were available during my care of the patient were reviewed by me and considered in my medical decision making (see chart for details).  69 year old male presents with chronic nausea, vomiting, constipation and chronic right lower quadrant abdominal pain which have been worse over the past 3 days.  His vital signs are normal.  He is thin appearing but BMI is normal.  He has had a significant amount of unintentional weight loss over the past couple of years.  On exam he is tearful and anxious.  Heart is regular rate and rhythm.  There are coarse breath sounds and wheezing on the left side of the lungs. Abdomen is  soft and non-tender.  Will obtain labs, urine, chest x-ray, CT abdomen and pelvis.  Will attempt trial of Reglan  CBC is remarkable for mild leukocytosis of 10.7.  CMP is remarkable for hyperglycemia (264).  Lipase is minimally elevated to 65.  Exam is not consistent with pancreatitis.  UA is reassuring.  Chest x-ray is negative.  CT abdomen and pelvis does not show any acute abnormalities.  Shared visit with Dr. Roderic Palau.  Patient is complaining of significant anxiety.  Dose of Ativan given.  Patient was p.o. challenged and tolerated crackers and fluids.  Discussed with his wife at bedside that we will do a trial of scheduled Reglan.  He already has a follow-up with GI at the end of the month.  We will also give some additional low-dose of Phenergan for breakthrough nausea.  He is also requesting medication for anxiety but he is encouraged to follow-up with his PCP regarding this issue.  MDM Rules/Calculators/A&P                       Final Clinical Impression(s) / ED Diagnoses Final diagnoses:  Chronic nausea  Chronic RLQ pain    Rx / DC Orders ED Discharge Orders    None       Recardo Evangelist, PA-C 12/30/19 1721    Milton Ferguson, MD 12/31/19 [phone]

## 2019-12-30 NOTE — Discharge Instructions (Signed)
Please start Reglan and take three times daily for nausea and to help with digestion Take Phenergan as needed for breakthrough nausea Please follow up with Dr. Michail Sermon

## 2019-12-30 NOTE — ED Triage Notes (Signed)
Pt reports nausea vomiting x 3 days. He also has intermittent right lower quadrant pain. Pt has been able to keep fluids down but no solids. He has hx of chronic constipation.

## 2020-01-08 DIAGNOSIS — R351 Nocturia: Secondary | ICD-10-CM | POA: Diagnosis not present

## 2020-01-08 DIAGNOSIS — N5201 Erectile dysfunction due to arterial insufficiency: Secondary | ICD-10-CM | POA: Diagnosis not present

## 2020-01-08 DIAGNOSIS — N3941 Urge incontinence: Secondary | ICD-10-CM | POA: Diagnosis not present

## 2020-01-08 DIAGNOSIS — R102 Pelvic and perineal pain: Secondary | ICD-10-CM | POA: Diagnosis not present

## 2020-01-08 DIAGNOSIS — N401 Enlarged prostate with lower urinary tract symptoms: Secondary | ICD-10-CM | POA: Diagnosis not present

## 2020-01-11 MED FILL — HYDROCODON-APAP 10-325: 10-325 | 5 days supply | Qty: 20 | Fill #0

## 2020-01-11 MED FILL — AMOXICILLIN 500 MG CAPSULE: 500 | 10 days supply | Qty: 30 | Fill #0

## 2020-01-15 ENCOUNTER — Other Ambulatory Visit (HOSPITAL_COMMUNITY): Payer: Self-pay | Admitting: Gastroenterology

## 2020-01-15 DIAGNOSIS — R1031 Right lower quadrant pain: Secondary | ICD-10-CM | POA: Diagnosis not present

## 2020-01-15 DIAGNOSIS — Z86010 Personal history of colonic polyps: Secondary | ICD-10-CM | POA: Diagnosis not present

## 2020-01-15 DIAGNOSIS — K219 Gastro-esophageal reflux disease without esophagitis: Secondary | ICD-10-CM | POA: Diagnosis not present

## 2020-01-15 DIAGNOSIS — R112 Nausea with vomiting, unspecified: Secondary | ICD-10-CM | POA: Diagnosis not present

## 2020-01-15 MED FILL — ONDANSETRON HCL 4 MG TABS: 4 | 20 days supply | Qty: 60 | Fill #0

## 2020-01-16 ENCOUNTER — Other Ambulatory Visit: Payer: Self-pay | Admitting: Gastroenterology

## 2020-01-19 ENCOUNTER — Other Ambulatory Visit: Payer: Self-pay | Admitting: Gastroenterology

## 2020-01-25 DIAGNOSIS — H25041 Posterior subcapsular polar age-related cataract, right eye: Secondary | ICD-10-CM | POA: Diagnosis not present

## 2020-01-25 DIAGNOSIS — H40051 Ocular hypertension, right eye: Secondary | ICD-10-CM | POA: Diagnosis not present

## 2020-01-30 ENCOUNTER — Other Ambulatory Visit: Payer: Self-pay | Admitting: Gastroenterology

## 2020-02-02 ENCOUNTER — Other Ambulatory Visit (HOSPITAL_COMMUNITY)
Admission: RE | Admit: 2020-02-02 | Discharge: 2020-02-02 | Disposition: A | Discharge status: Home / Self Care | Payer: 59 | Source: Ambulatory Visit | Attending: Gastroenterology | Admitting: Gastroenterology

## 2020-02-02 ENCOUNTER — Other Ambulatory Visit: Payer: Self-pay | Admitting: Interventional Cardiology

## 2020-02-02 ENCOUNTER — Other Ambulatory Visit: Payer: Self-pay

## 2020-02-02 DIAGNOSIS — Z20822 Contact with and (suspected) exposure to covid-19: Secondary | ICD-10-CM | POA: Diagnosis not present

## 2020-02-02 DIAGNOSIS — Z01812 Encounter for preprocedural laboratory examination: Secondary | ICD-10-CM | POA: Insufficient documentation

## 2020-02-02 LAB — SARS CORONAVIRUS 2 (TAT 6-24 HRS): SARS Coronavirus 2: NEGATIVE

## 2020-02-02 MED ORDER — METOPROLOL SUCCINATE ER 25 MG PO TB24: 25.0000 mg | ORAL_TABLET | Freq: Every day | ORAL | 2 refills | Status: DC

## 2020-02-06 ENCOUNTER — Ambulatory Visit (HOSPITAL_COMMUNITY): Payer: 59 | Admitting: Anesthesiology

## 2020-02-06 ENCOUNTER — Encounter (HOSPITAL_COMMUNITY)
Admission: RE | Disposition: A | Discharge status: Home / Self Care | Payer: Self-pay | Source: Home / Self Care | Attending: Gastroenterology

## 2020-02-06 ENCOUNTER — Ambulatory Visit (HOSPITAL_COMMUNITY)
Admission: RE | Admit: 2020-02-06 | Discharge: 2020-02-06 | Disposition: A | Discharge status: Home / Self Care | Payer: 59 | Attending: Gastroenterology | Admitting: Gastroenterology

## 2020-02-06 ENCOUNTER — Encounter (HOSPITAL_COMMUNITY): Payer: Self-pay | Admitting: Gastroenterology

## 2020-02-06 ENCOUNTER — Other Ambulatory Visit: Payer: Self-pay

## 2020-02-06 DIAGNOSIS — Z801 Family history of malignant neoplasm of trachea, bronchus and lung: Secondary | ICD-10-CM | POA: Diagnosis not present

## 2020-02-06 DIAGNOSIS — K635 Polyp of colon: Secondary | ICD-10-CM | POA: Diagnosis not present

## 2020-02-06 DIAGNOSIS — I252 Old myocardial infarction: Secondary | ICD-10-CM | POA: Diagnosis not present

## 2020-02-06 DIAGNOSIS — Z8379 Family history of other diseases of the digestive system: Secondary | ICD-10-CM | POA: Diagnosis not present

## 2020-02-06 DIAGNOSIS — K5730 Diverticulosis of large intestine without perforation or abscess without bleeding: Secondary | ICD-10-CM | POA: Diagnosis not present

## 2020-02-06 DIAGNOSIS — D124 Benign neoplasm of descending colon: Secondary | ICD-10-CM | POA: Insufficient documentation

## 2020-02-06 DIAGNOSIS — Z1211 Encounter for screening for malignant neoplasm of colon: Secondary | ICD-10-CM | POA: Diagnosis not present

## 2020-02-06 DIAGNOSIS — Z86010 Personal history of colonic polyps: Secondary | ICD-10-CM | POA: Insufficient documentation

## 2020-02-06 DIAGNOSIS — Z888 Allergy status to other drugs, medicaments and biological substances status: Secondary | ICD-10-CM | POA: Diagnosis not present

## 2020-02-06 DIAGNOSIS — Z7982 Long term (current) use of aspirin: Secondary | ICD-10-CM | POA: Diagnosis not present

## 2020-02-06 DIAGNOSIS — K3189 Other diseases of stomach and duodenum: Secondary | ICD-10-CM | POA: Insufficient documentation

## 2020-02-06 DIAGNOSIS — D123 Benign neoplasm of transverse colon: Secondary | ICD-10-CM | POA: Insufficient documentation

## 2020-02-06 DIAGNOSIS — E785 Hyperlipidemia, unspecified: Secondary | ICD-10-CM | POA: Diagnosis not present

## 2020-02-06 DIAGNOSIS — K2970 Gastritis, unspecified, without bleeding: Secondary | ICD-10-CM | POA: Diagnosis not present

## 2020-02-06 DIAGNOSIS — I2510 Atherosclerotic heart disease of native coronary artery without angina pectoris: Secondary | ICD-10-CM | POA: Insufficient documentation

## 2020-02-06 DIAGNOSIS — R112 Nausea with vomiting, unspecified: Secondary | ICD-10-CM | POA: Insufficient documentation

## 2020-02-06 DIAGNOSIS — Z803 Family history of malignant neoplasm of breast: Secondary | ICD-10-CM | POA: Diagnosis not present

## 2020-02-06 DIAGNOSIS — Z79899 Other long term (current) drug therapy: Secondary | ICD-10-CM | POA: Diagnosis not present

## 2020-02-06 DIAGNOSIS — Z87891 Personal history of nicotine dependence: Secondary | ICD-10-CM | POA: Insufficient documentation

## 2020-02-06 DIAGNOSIS — K2950 Unspecified chronic gastritis without bleeding: Secondary | ICD-10-CM | POA: Diagnosis not present

## 2020-02-06 DIAGNOSIS — D122 Benign neoplasm of ascending colon: Secondary | ICD-10-CM | POA: Diagnosis not present

## 2020-02-06 DIAGNOSIS — K2100 Gastro-esophageal reflux disease with esophagitis, without bleeding: Secondary | ICD-10-CM | POA: Insufficient documentation

## 2020-02-06 DIAGNOSIS — I10 Essential (primary) hypertension: Secondary | ICD-10-CM | POA: Diagnosis not present

## 2020-02-06 DIAGNOSIS — Z7984 Long term (current) use of oral hypoglycemic drugs: Secondary | ICD-10-CM | POA: Insufficient documentation

## 2020-02-06 DIAGNOSIS — K219 Gastro-esophageal reflux disease without esophagitis: Secondary | ICD-10-CM | POA: Diagnosis not present

## 2020-02-06 DIAGNOSIS — K640 First degree hemorrhoids: Secondary | ICD-10-CM | POA: Diagnosis not present

## 2020-02-06 HISTORY — PX: POLYPECTOMY: SHX5525

## 2020-02-06 HISTORY — PX: BIOPSY: SHX5522

## 2020-02-06 HISTORY — PX: COLONOSCOPY WITH PROPOFOL: SHX5780

## 2020-02-06 HISTORY — PX: ESOPHAGOGASTRODUODENOSCOPY (EGD) WITH PROPOFOL: SHX5813

## 2020-02-06 HISTORY — PX: FOREIGN BODY RETRIEVAL: CATH118241

## 2020-02-06 SURGERY — COLONOSCOPY WITH PROPOFOL
Anesthesia: Monitor Anesthesia Care

## 2020-02-06 SURGERY — ESOPHAGOGASTRODUODENOSCOPY (EGD) WITH PROPOFOL
Anesthesia: Monitor Anesthesia Care

## 2020-02-06 MED ORDER — LIDOCAINE HCL 1 % IJ SOLN
INTRAMUSCULAR | Status: DC | PRN
  Administered 2020-02-06: 50 mg via INTRADERMAL

## 2020-02-06 MED ORDER — PROPOFOL 10 MG/ML IV BOLUS
INTRAVENOUS | Status: DC | PRN
  Administered 2020-02-06: 10 mg via INTRAVENOUS

## 2020-02-06 MED ORDER — SODIUM CHLORIDE 0.9 % IV SOLN: INTRAVENOUS | Status: DC

## 2020-02-06 MED ORDER — PROPOFOL 500 MG/50ML IV EMUL
INTRAVENOUS | Status: AC
  Filled 2020-02-06: qty 50

## 2020-02-06 MED ORDER — PROPOFOL 500 MG/50ML IV EMUL
INTRAVENOUS | Status: DC | PRN
  Administered 2020-02-06: 100 ug/kg/min via INTRAVENOUS

## 2020-02-06 MED ORDER — LACTATED RINGERS IV SOLN
INTRAVENOUS | Status: DC | PRN
Start: 2020-02-06 — End: 2020-02-06

## 2020-02-06 SURGICAL SUPPLY — 25 items

## 2020-02-06 NOTE — Anesthesia Preprocedure Evaluation (Addendum)
Anesthesia Evaluation  Patient identified by MRN, date of birth, ID band Patient awake    Reviewed: Allergy & Precautions, NPO status , Patient's Chart, lab work & pertinent test results  Airway Mallampati: II  TM Distance: >3 FB     Dental  (+) Dental Advisory Given   Pulmonary former smoker,    breath sounds clear to auscultation       Cardiovascular hypertension, Pt. on medications and Pt. on home beta blockers + CAD and + Past MI   Rhythm:Regular Rate:Normal     Neuro/Psych negative neurological ROS     GI/Hepatic Neg liver ROS, GERD  ,  Endo/Other  diabetes, Type 2  Renal/GU negative Renal ROS     Musculoskeletal   Abdominal   Peds  Hematology negative hematology ROS (+)   Anesthesia Other Findings   Reproductive/Obstetrics                             Anesthesia Physical Anesthesia Plan  ASA: III  Anesthesia Plan: MAC   Post-op Pain Management:    Induction: Intravenous  PONV Risk Score and Plan: 1 and Propofol infusion, Ondansetron and Treatment may vary due to age or medical condition  Airway Management Planned: Natural Airway and Nasal Cannula  Additional Equipment: None  Intra-op Plan:   Post-operative Plan:   Informed Consent: I have reviewed the patients History and Physical, chart, labs and discussed the procedure including the risks, benefits and alternatives for the proposed anesthesia with the patient or authorized representative who has indicated his/her understanding and acceptance.       Plan Discussed with: CRNA  Anesthesia Plan Comments:         Anesthesia Quick Evaluation

## 2020-02-06 NOTE — Op Note (Signed)
[facility] Patient Name: Justin Key Procedure Date: 02/06/2020 MRN: [id number] Attending MD: Lear Ng , MD Date of Birth: 07/31/1951 CSN: [id number] Age: 69 Admit Type: Outpatient Procedure:                Colonoscopy Indications:              High risk colon cancer surveillance: Personal                            history of colonic polyps, Last colonoscopy: date                            unknown Providers:                Lear Ng, MD, Benetta Spar RN, RN,                            Laverda Sorenson, Technician, Karis Juba, CRNA Referring MD:             Kathyrn Lass Medicines:                Propofol per Anesthesia, Monitored Anesthesia Care Complications:            No immediate complications. Estimated Blood Loss:     Estimated blood loss: none. Procedure:                Pre-Anesthesia Assessment:                           - Prior to the procedure, a History and Physical                            was performed, and patient medications and                            allergies were reviewed. The patient's tolerance of                            previous anesthesia was also reviewed. The risks                            and benefits of the procedure and the sedation                            options and risks were discussed with the patient.                            All questions were answered, and informed consent                            was obtained. Prior Anticoagulants: The patient has                            taken no previous anticoagulant or antiplatelet  agents. ASA Grade Assessment: III - A patient with                            severe systemic disease. After reviewing the risks                            and benefits, the patient was deemed in                            satisfactory condition to undergo the procedure.                           After obtaining informed consent, the colonoscope                             was passed under direct vision. Throughout the                            procedure, the patient's blood pressure, pulse, and                            oxygen saturations were monitored continuously. The                            [id number] Olympus pediatric colonscope                            was introduced through the anus and advanced to the                            the cecum, identified by appendiceal orifice and                            ileocecal valve. The colonoscopy was performed with                            difficulty due to significant looping, a tortuous                            colon and fair prep. Successful completion of the                            procedure was aided by straightening and shortening                            the scope to obtain bowel loop reduction, applying                            abdominal pressure and lavage. The patient                            tolerated the procedure well. The quality of the  bowel preparation was fair. The ileocecal valve,                            appendiceal orifice, and rectum were photographed. Scope In: 1:54:39 PM Scope Out: 2:36:37 PM Scope Withdrawal Time: 0 hours 26 minutes 47 seconds  Total Procedure Duration: 0 hours 41 minutes 58 seconds  Findings:      The perianal and digital rectal examinations were normal.      Nine sessile and semi-sessile polyps were found in the sigmoid colon,       descending colon and transverse colon. The polyps were 4 to 12 mm in       size. These polyps were removed with a hot snare. Resection and       retrieval were complete. Estimated blood loss: none.      A 25 mm polyp was found in the ascending colon. The polyp was sessile.       The polyp was removed with a hot snare. Resection and retrieval were       complete. Estimated blood loss: none.      Scattered small-mouthed diverticula were found in the  sigmoid colon.      Internal hemorrhoids were found during retroflexion. The hemorrhoids       were medium-sized and Grade I (internal hemorrhoids that do not       prolapse). Impression:               - Preparation of the colon was fair.                           - Nine 4 to 12 mm polyps in the sigmoid colon, in                            the descending colon and in the transverse colon,                            removed with a hot snare. Resected and retrieved.                           - One 25 mm polyp in the ascending colon, removed                            with a hot snare. Resected and retrieved.                           - Diverticulosis in the sigmoid colon.                           - Internal hemorrhoids. Moderate Sedation:      Not Applicable - Patient had care per Anesthesia. Recommendation:           - Await pathology results.                           - No ibuprofen, naproxen, or other non-steroidal                            anti-inflammatory drugs for  2 weeks.                           - Await pathology results.                           - Repeat colonoscopy for surveillance based on                            pathology results. Procedure Code(s):        --- Professional ---                           [phone], Colonoscopy, flexible; with removal of                            tumor(s), polyp(s), or other lesion(s) by snare                            technique Diagnosis Code(s):        --- Professional ---                           Z86.010, Personal history of colonic polyps                           K63.5, Polyp of colon                           K64.0, First degree hemorrhoids                           K57.30, Diverticulosis of large intestine without                            perforation or abscess without bleeding CPT copyright 2019 [employer]. All rights reserved. The codes documented in this report are preliminary and upon coder review may  be  revised to meet current compliance requirements. Lear Ng, MD 02/06/2020 2:53:59 PM This report has been signed electronically. Number of Addenda: 0

## 2020-02-06 NOTE — Op Note (Addendum)
[facility] Patient Name: Justin Key Procedure Date: 02/06/2020 MRN: [id number] Attending MD: Lear Ng , MD Date of Birth: 03-13-1951 CSN: [id number] Age: 69 Admit Type: Outpatient Procedure:                Upper GI endoscopy Indications:              Esophageal reflux, Nausea with vomiting Providers:                Lear Ng, MD, Benetta Spar RN, RN,                            Laverda Sorenson, Technician, Karis Juba, CRNA Referring MD:             Kathyrn Lass Medicines:                Propofol per Anesthesia, Monitored Anesthesia Care Complications:            No immediate complications. Estimated Blood Loss:     Estimated blood loss was minimal. Procedure:                Pre-Anesthesia Assessment:                           - Prior to the procedure, a History and Physical                            was performed, and patient medications and                            allergies were reviewed. The patient's tolerance of                            previous anesthesia was also reviewed. The risks                            and benefits of the procedure and the sedation                            options and risks were discussed with the patient.                            All questions were answered, and informed consent                            was obtained. Prior Anticoagulants: The patient has                            taken no previous anticoagulant or antiplatelet                            agents. ASA Grade Assessment: III - A patient with                            severe systemic disease. After reviewing the risks  and benefits, the patient was deemed in                            satisfactory condition to undergo the procedure.                           After obtaining informed consent, the endoscope was                            passed under direct vision. Throughout the   procedure, the patient's blood pressure, pulse, and                            oxygen saturations were monitored continuously. The                            [id number] Olympus gastroscope was                            introduced through the mouth, and advanced to the                            second part of duodenum. The upper GI endoscopy was                            accomplished without difficulty. The patient                            tolerated the procedure well. Scope In: Scope Out: Findings:      The examined esophagus was normal.      The Z-line was regular and was found 42 cm from the incisors.      Localized moderate inflammation characterized by congestion (edema) and       erythema was found in the prepyloric region of the stomach. Biopsies       were taken with a cold forceps for histology. Estimated blood loss was       minimal.      Patchy mild mucosal changes characterized by congestion, erythema and       erosion were found in the duodenal bulb.      The exam of the duodenum was otherwise normal. Impression:               - Normal esophagus.                           - Z-line regular, 42 cm from the incisors.                           - Gastritis. Biopsied.                           - Mucosal changes in the duodenum. Moderate Sedation:      Not Applicable - Patient had care per Anesthesia. Recommendation:           - Patient has a contact number available for  emergencies. The signs and symptoms of potential                            delayed complications were discussed with the                            patient. Return to normal activities tomorrow.                            Written discharge instructions were provided to the                            patient.                           - Await pathology results.                           - Follow an antireflux regimen. Procedure Code(s):        --- Professional ---                            [phone], Esophagogastroduodenoscopy, flexible,                            transoral; with biopsy, single or multiple Diagnosis Code(s):        --- Professional ---                           K21.9, Gastro-esophageal reflux disease without                            esophagitis                           R11.2, Nausea with vomiting, unspecified                           K29.70, Gastritis, unspecified, without bleeding                           K31.89, Other diseases of stomach and duodenum CPT copyright 2019 [employer]. All rights reserved. The codes documented in this report are preliminary and upon coder review may  be revised to meet current compliance requirements. Lear Ng, MD 02/06/2020 2:47:52 PM This report has been signed electronically. Number of Addenda: 0

## 2020-02-06 NOTE — Transfer of Care (Signed)
Immediate Anesthesia Transfer of Care Note  Patient: [name]  Procedure(s) Performed: COLONOSCOPY WITH PROPOFOL (N/A ) ESOPHAGOGASTRODUODENOSCOPY (EGD) WITH PROPOFOL (N/A ) BIOPSY POLYPECTOMY FOREIGN BODY RETRIEVAL (N/A )  Patient Location: PACU  Anesthesia Type:MAC  Level of Consciousness: drowsy, patient cooperative and responds to stimulation  Airway & Oxygen Therapy: Patient Spontanous Breathing and Patient connected to face mask oxygen  Post-op Assessment: Report given to RN and Post -op Vital signs reviewed and stable  Post vital signs: Reviewed and stable  Last Vitals:  Vitals Value Taken Time  BP 175/94 02/06/20 1445  Temp    Pulse 81 02/06/20 1446  Resp 12 02/06/20 1446  SpO2 100 % 02/06/20 1446  Vitals shown include unvalidated device data.  Last Pain:  Vitals:   02/06/20 1308  TempSrc: Oral  PainSc: 0-No pain         Complications: No apparent anesthesia complications

## 2020-02-06 NOTE — Discharge Instructions (Signed)

## 2020-02-07 ENCOUNTER — Other Ambulatory Visit: Payer: Self-pay

## 2020-02-07 LAB — SURGICAL PATHOLOGY

## 2020-02-07 NOTE — Anesthesia Postprocedure Evaluation (Signed)
Anesthesia Post Note  Patient: Justin Key  Procedure(s) Performed: COLONOSCOPY WITH PROPOFOL (N/A ) ESOPHAGOGASTRODUODENOSCOPY (EGD) WITH PROPOFOL (N/A ) BIOPSY POLYPECTOMY FOREIGN BODY RETRIEVAL (N/A )     Patient location during evaluation: PACU Anesthesia Type: MAC Level of consciousness: awake and alert Pain management: pain level controlled Vital Signs Assessment: post-procedure vital signs reviewed and stable Respiratory status: spontaneous breathing, nonlabored ventilation, respiratory function stable and patient connected to nasal cannula oxygen Cardiovascular status: stable and blood pressure returned to baseline Postop Assessment: no apparent nausea or vomiting Anesthetic complications: no    Last Vitals:  Vitals:   02/06/20 1500 02/06/20 1508  BP: (!) 156/100 (!) 175/105  Pulse: 66   Resp: 16 11  Temp:    SpO2: 100%     Last Pain:  Vitals:   02/06/20 1500  TempSrc:   PainSc: 0-No pain                 Tiajuana Amass

## 2020-02-21 MED FILL — FREESTYLE LITE TEST STRIP: 90 days supply | Qty: 200 | Fill #1

## 2020-03-06 MED FILL — JANUVIA 100 MG TABLET: 100 | 30 days supply | Qty: 30 | Fill #3

## 2020-03-19 ENCOUNTER — Other Ambulatory Visit: Payer: Self-pay | Admitting: Internal Medicine

## 2020-03-19 DIAGNOSIS — R918 Other nonspecific abnormal finding of lung field: Secondary | ICD-10-CM

## 2020-03-26 DIAGNOSIS — K5904 Chronic idiopathic constipation: Secondary | ICD-10-CM | POA: Diagnosis not present

## 2020-03-26 DIAGNOSIS — Z125 Encounter for screening for malignant neoplasm of prostate: Secondary | ICD-10-CM | POA: Diagnosis not present

## 2020-03-26 DIAGNOSIS — Z6822 Body mass index (BMI) 22.0-22.9, adult: Secondary | ICD-10-CM | POA: Diagnosis not present

## 2020-03-26 DIAGNOSIS — R600 Localized edema: Secondary | ICD-10-CM | POA: Diagnosis not present

## 2020-03-26 DIAGNOSIS — R109 Unspecified abdominal pain: Secondary | ICD-10-CM | POA: Diagnosis not present

## 2020-03-26 DIAGNOSIS — R634 Abnormal weight loss: Secondary | ICD-10-CM | POA: Diagnosis not present

## 2020-03-26 DIAGNOSIS — E1169 Type 2 diabetes mellitus with other specified complication: Secondary | ICD-10-CM | POA: Diagnosis not present

## 2020-03-26 DIAGNOSIS — R9389 Abnormal findings on diagnostic imaging of other specified body structures: Secondary | ICD-10-CM | POA: Diagnosis not present

## 2020-03-27 MED FILL — traMADol HCL 50 MG TABS: 50 | 2 days supply | Qty: 16 | Fill #0

## 2020-04-09 MED FILL — JANUVIA 100 MG TABLET: 100 | 30 days supply | Qty: 30 | Fill #4

## 2020-04-10 ENCOUNTER — Ambulatory Visit
Admission: RE | Admit: 2020-04-10 | Discharge: 2020-04-10 | Disposition: A | Discharge status: Home / Self Care | Payer: 59 | Source: Ambulatory Visit | Attending: Internal Medicine | Admitting: Internal Medicine

## 2020-04-10 DIAGNOSIS — R911 Solitary pulmonary nodule: Secondary | ICD-10-CM | POA: Diagnosis not present

## 2020-04-10 DIAGNOSIS — R918 Other nonspecific abnormal finding of lung field: Secondary | ICD-10-CM

## 2020-04-11 ENCOUNTER — Telehealth: Payer: Self-pay | Admitting: Internal Medicine

## 2020-04-11 DIAGNOSIS — R911 Solitary pulmonary nodule: Secondary | ICD-10-CM

## 2020-04-11 NOTE — Telephone Encounter (Signed)
Called and spoke with Patient.  Dr Gustavus Bryant results and recommendations given.  Understanding stated.  CT chest order placed for 03/2021.  Nothing further at this time.

## 2020-04-11 NOTE — Progress Notes (Signed)
LMTCB

## 2020-05-01 MED FILL — GLIMEPIRIDE 4 MG TABS: 4 | 90 days supply | Qty: 180 | Fill #2

## 2020-05-21 DIAGNOSIS — H5213 Myopia, bilateral: Secondary | ICD-10-CM | POA: Diagnosis not present

## 2020-05-21 DIAGNOSIS — H524 Presbyopia: Secondary | ICD-10-CM | POA: Diagnosis not present

## 2020-05-21 DIAGNOSIS — H52203 Unspecified astigmatism, bilateral: Secondary | ICD-10-CM | POA: Diagnosis not present

## 2020-05-22 ENCOUNTER — Other Ambulatory Visit (HOSPITAL_COMMUNITY): Payer: Self-pay | Admitting: Endocrinology

## 2020-05-22 MED FILL — JANUVIA 100 MG TABLET: 100 | 30 days supply | Qty: 30 | Fill #0

## 2020-05-28 DIAGNOSIS — Z9119 Patient's noncompliance with other medical treatment and regimen: Secondary | ICD-10-CM | POA: Diagnosis not present

## 2020-05-28 DIAGNOSIS — E7800 Pure hypercholesterolemia, unspecified: Secondary | ICD-10-CM | POA: Diagnosis not present

## 2020-05-28 DIAGNOSIS — E1165 Type 2 diabetes mellitus with hyperglycemia: Secondary | ICD-10-CM | POA: Diagnosis not present

## 2020-05-28 DIAGNOSIS — K3184 Gastroparesis: Secondary | ICD-10-CM | POA: Diagnosis not present

## 2020-05-28 DIAGNOSIS — I10 Essential (primary) hypertension: Secondary | ICD-10-CM | POA: Diagnosis not present

## 2020-06-13 MED FILL — traZODone HCL 50 MG TABS: 50 | 90 days supply | Qty: 180 | Fill #0

## 2020-06-20 DIAGNOSIS — R591 Generalized enlarged lymph nodes: Secondary | ICD-10-CM | POA: Diagnosis not present

## 2020-06-20 DIAGNOSIS — R600 Localized edema: Secondary | ICD-10-CM | POA: Diagnosis not present

## 2020-07-18 MED FILL — JANUVIA 100 MG TABLET: 100 | 30 days supply | Qty: 30 | Fill #1

## 2020-08-07 ENCOUNTER — Other Ambulatory Visit (HOSPITAL_COMMUNITY): Payer: Self-pay | Admitting: Family Medicine

## 2020-08-07 MED FILL — PANTOPRAZOLE SOD DR 40 MG T: 40 | 90 days supply | Qty: 180 | Fill #0

## 2020-08-07 MED FILL — GLIMEPIRIDE 4 MG TABS: 4 | 90 days supply | Qty: 180 | Fill #3

## 2020-08-07 MED FILL — FREESTYLE LITE TEST STRIP: 90 days supply | Qty: 200 | Fill #0

## 2020-08-07 MED FILL — METOPROLOL SUCCINATE ER 25: 25 | 90 days supply | Qty: 90 | Fill #1

## 2020-08-08 MED FILL — metFORMIN HCL ER 500 MG TB2: 500 | 90 days supply | Qty: 180 | Fill #0

## 2020-08-13 ENCOUNTER — Other Ambulatory Visit (HOSPITAL_COMMUNITY): Payer: Self-pay | Admitting: Pain Medicine

## 2020-08-13 MED FILL — LIDOCAINE 5 % PTCH: 5 | 30 days supply | Qty: 60 | Fill #0

## 2020-08-30 MED FILL — JANUVIA 100 MG TABLET: 100 | 30 days supply | Qty: 30 | Fill #2

## 2020-09-23 ENCOUNTER — Other Ambulatory Visit (HOSPITAL_COMMUNITY): Payer: Self-pay | Admitting: Family Medicine

## 2020-09-23 MED FILL — traZODone HCL 50 MG TABS: 50 | 90 days supply | Qty: 180 | Fill #0

## 2020-09-26 ENCOUNTER — Telehealth: Payer: Self-pay | Admitting: Internal Medicine

## 2020-09-26 DIAGNOSIS — R918 Other nonspecific abnormal finding of lung field: Secondary | ICD-10-CM

## 2020-09-26 NOTE — Telephone Encounter (Signed)
Called and spoke with pt's wife [name] letting her know the info stated by MW and she verbalized understanding. Stated to her that we would place specimen cup up front at office for her to come by and pick up and she verbalized understanding. Nothing further needed.

## 2020-09-26 NOTE — Telephone Encounter (Signed)
MW pts wife is wanting to know if we can order another sputum culture and they do this in the morning.  She feels that the last one was not good enough.  Please advise. Thanks

## 2020-09-26 NOTE — Telephone Encounter (Signed)
Fine with me, dx pulmonary infiltrates

## 2020-10-03 IMAGING — DX CHEST - 2 VIEW
3 series · 3 of 3 positions shown · non-contrast
Comparison: Chest x-ray dated 07/30/2017.

CLINICAL DATA: Productive cough

EXAM:
CHEST - 2 VIEW

[chest pa (1 of 2)]
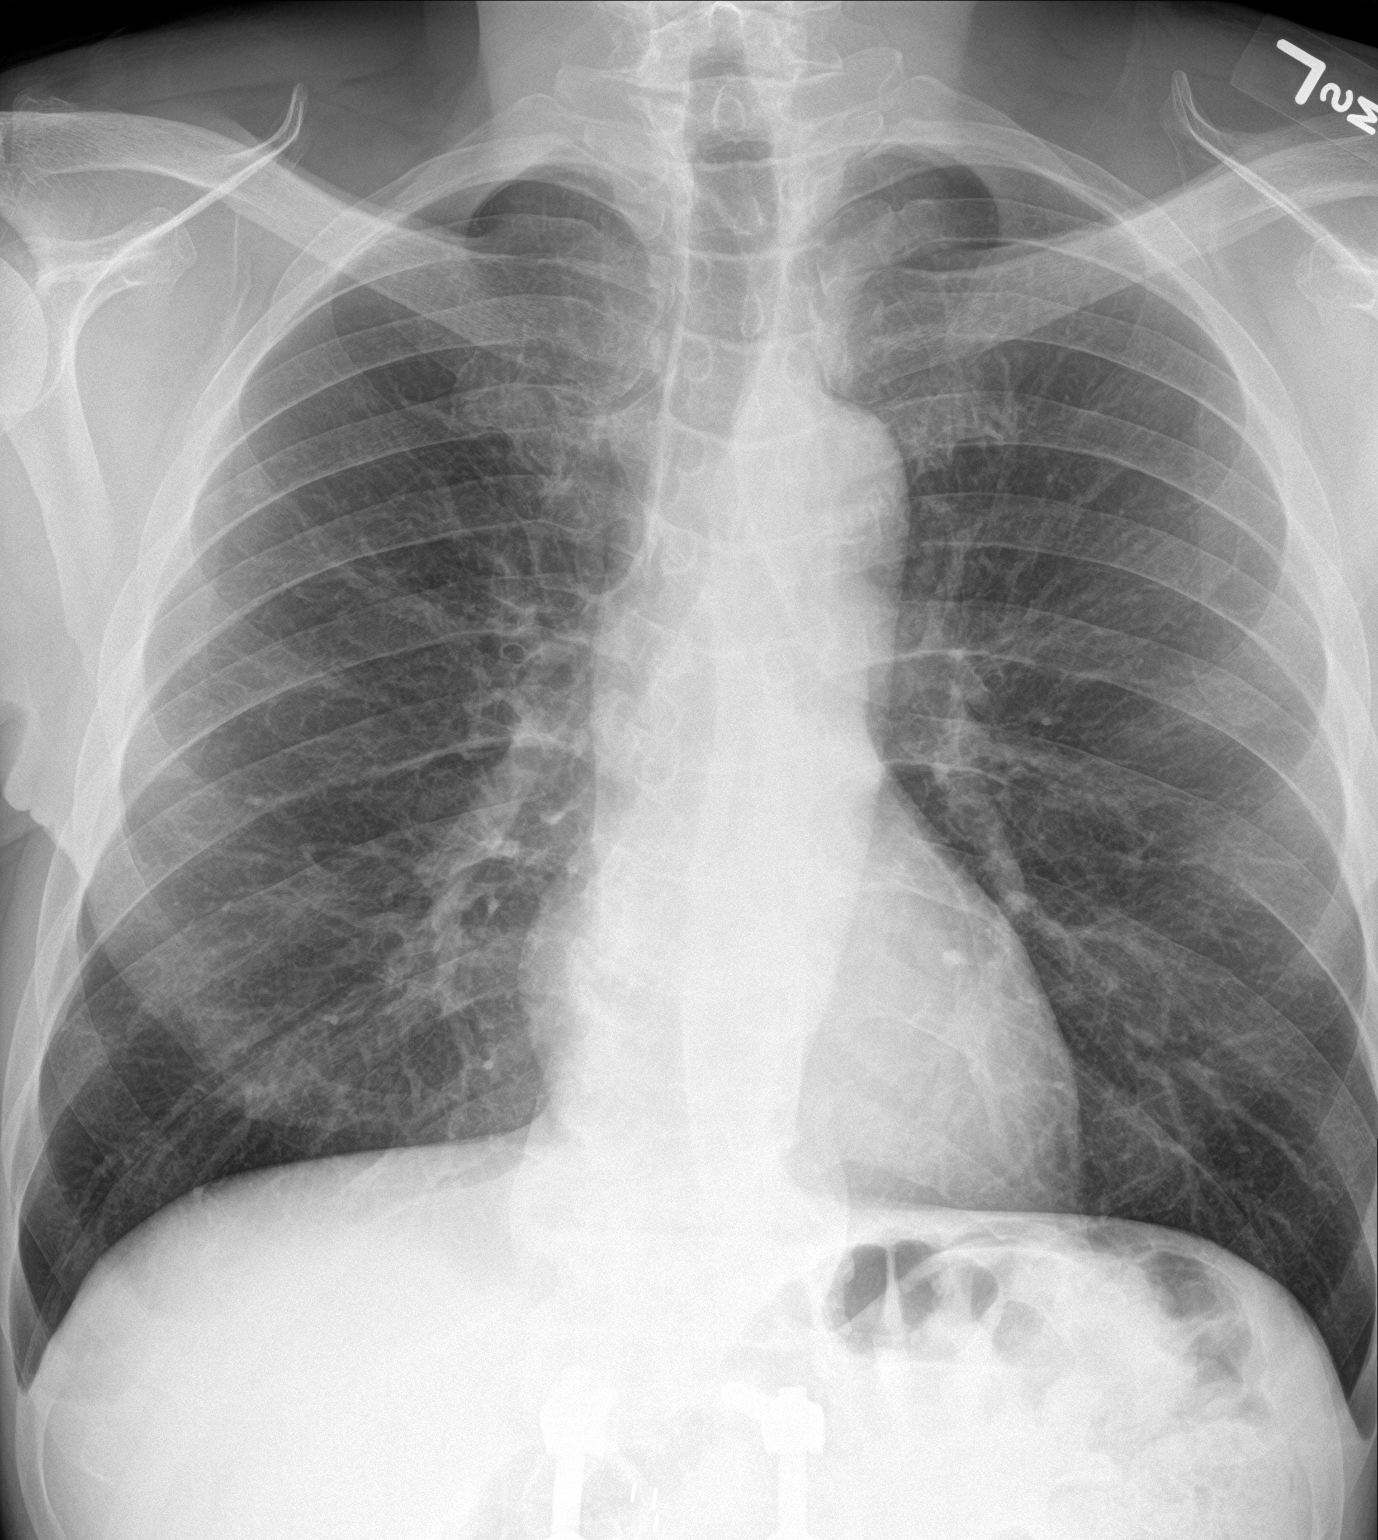

[chest lat]
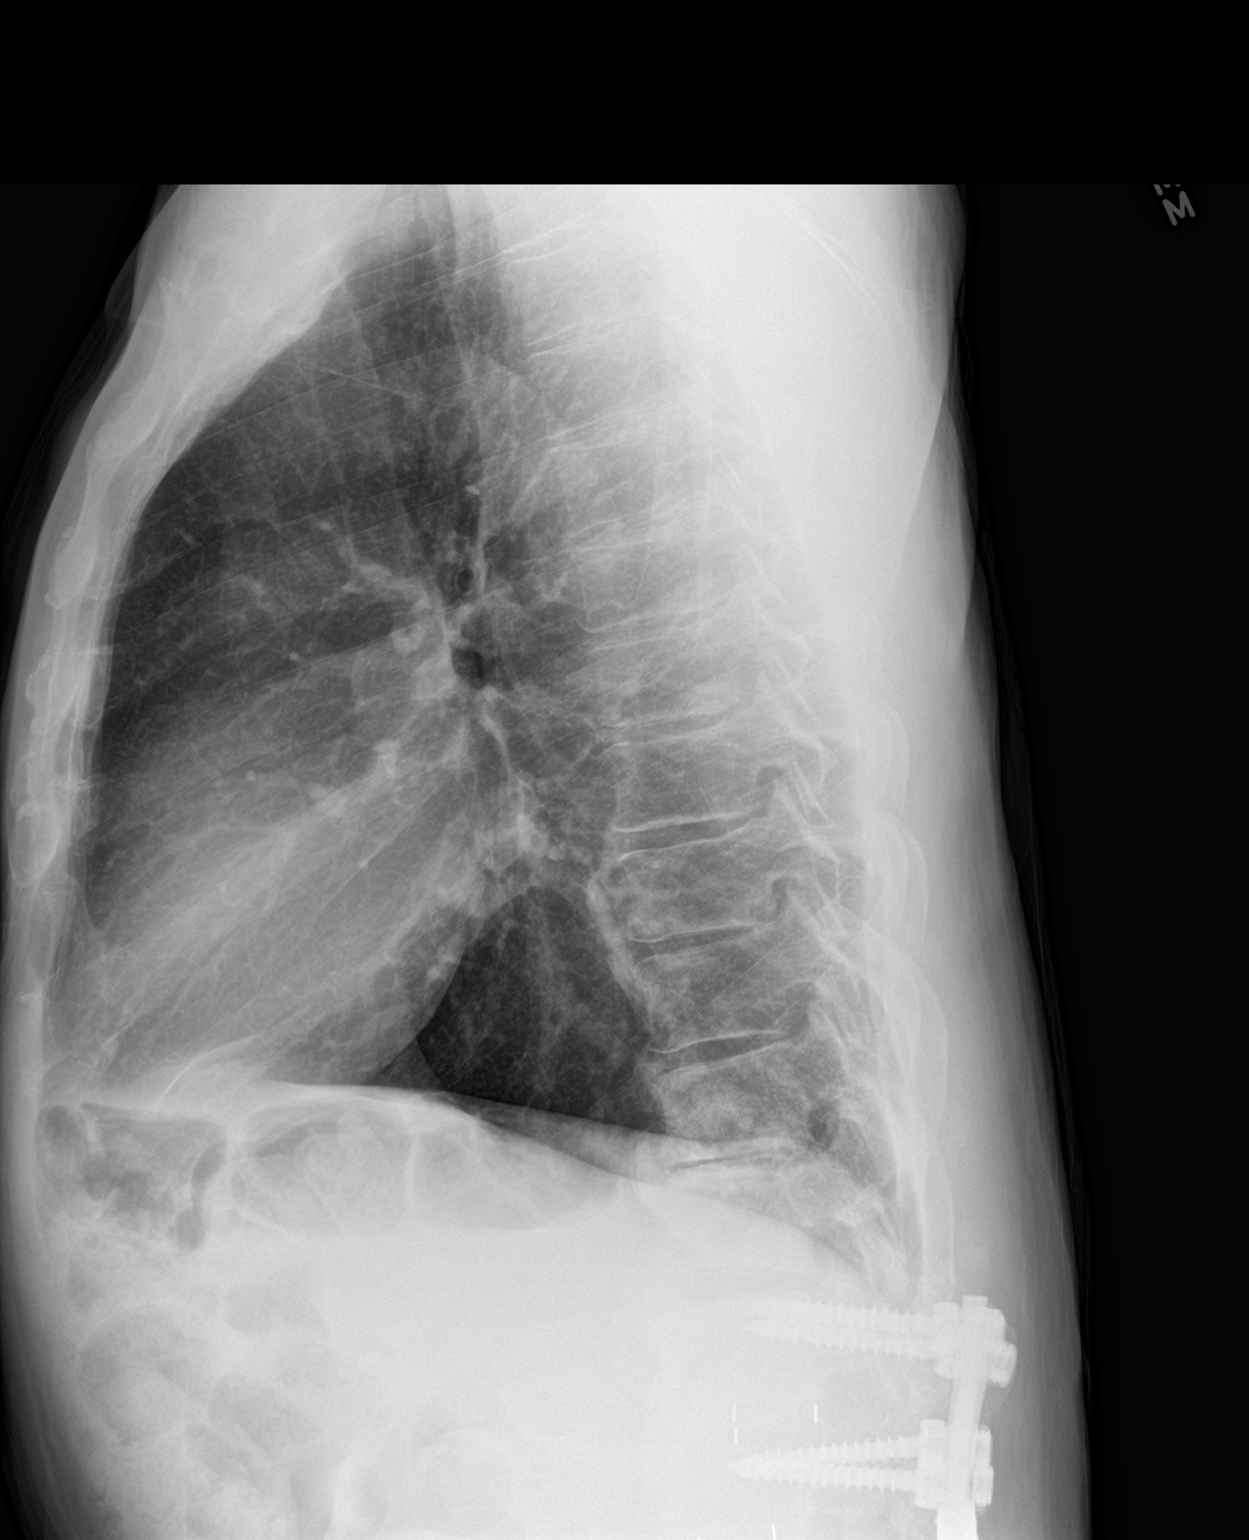

[chest pa (2 of 2)]
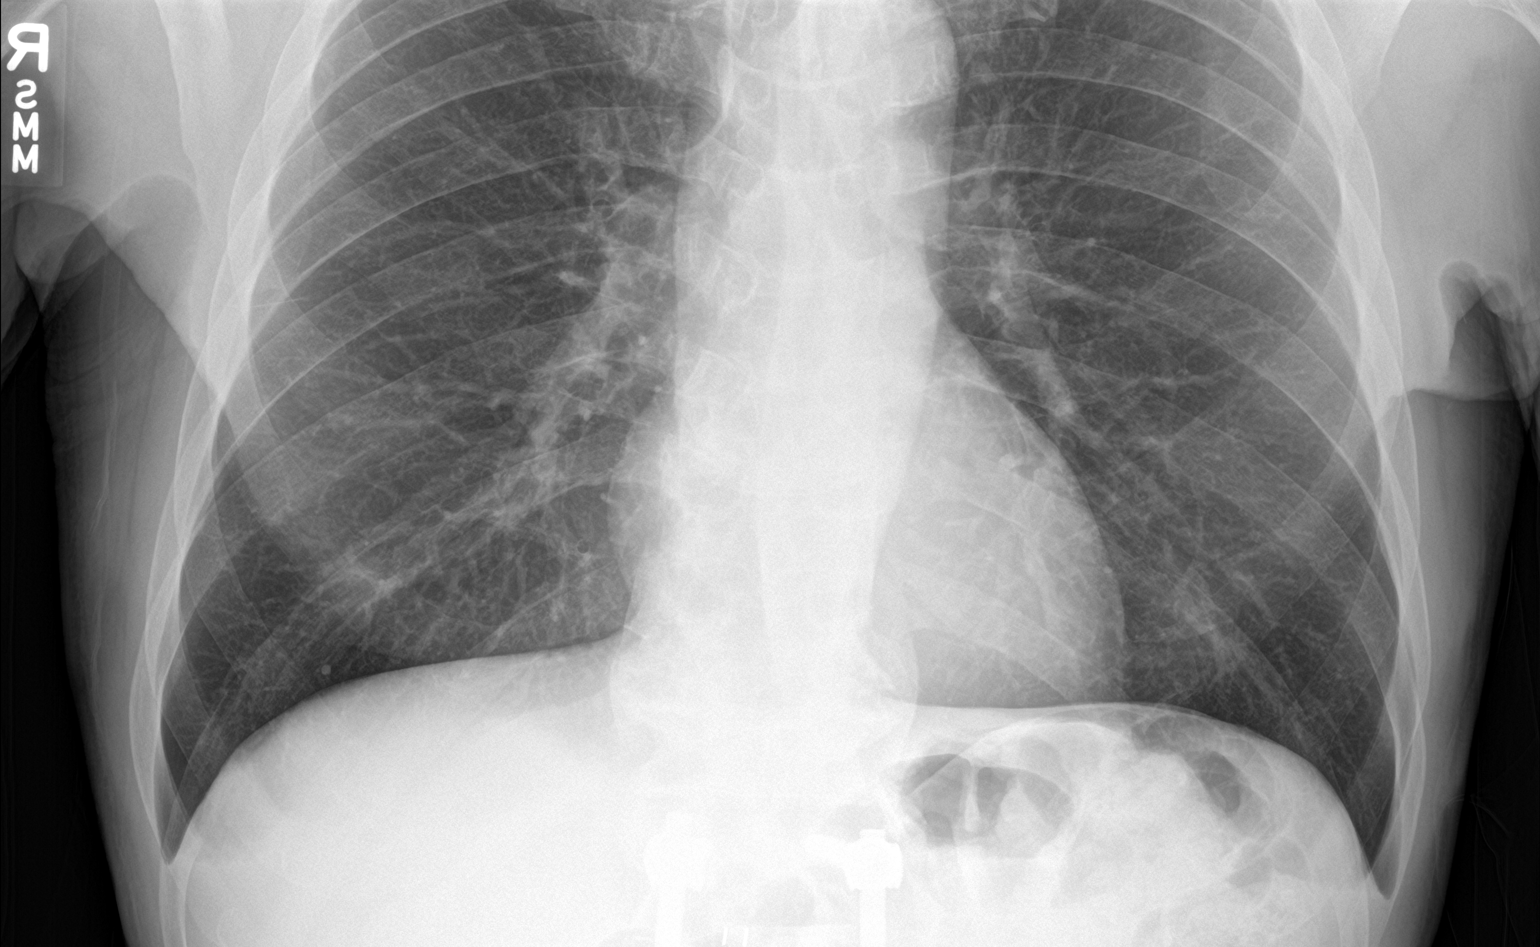

[3 of 3 positions shown; findings below may reference images not displayed]

FINDINGS: The heart size and mediastinal contours are within normal limits.
Both lungs are clear. The visualized skeletal structures are
unremarkable.
IMPRESSION: No active cardiopulmonary disease.

## 2020-10-04 ENCOUNTER — Other Ambulatory Visit: Payer: 59

## 2020-10-04 DIAGNOSIS — R918 Other nonspecific abnormal finding of lung field: Secondary | ICD-10-CM

## 2020-10-07 ENCOUNTER — Other Ambulatory Visit (HOSPITAL_COMMUNITY): Payer: Self-pay | Admitting: Endocrinology

## 2020-10-07 LAB — RESPIRATORY CULTURE OR RESPIRATORY AND SPUTUM CULTURE
MICRO NUMBER:: 11420040
RESULT:: NORMAL
SPECIMEN QUALITY:: ADEQUATE

## 2020-10-07 MED FILL — JANUVIA 100 MG TABLET: 100 | 30 days supply | Qty: 30 | Fill #3

## 2020-10-14 ENCOUNTER — Telehealth: Payer: Self-pay | Admitting: Internal Medicine

## 2020-10-14 NOTE — Telephone Encounter (Signed)
Wants test results from last week -  Looks like sputum cx 10/04/20 showed normal flora -  Did you want anything else added to results /message ?  No office visit since 05/2019 .

## 2020-10-14 NOTE — Telephone Encounter (Signed)
Let pt know neg result but needs ov if new resp symptoms are developing as can't tell much from a single specimen

## 2020-10-14 NOTE — Telephone Encounter (Signed)
Left message for Justin Key, Pts wife (DPR) to call back regarding results.

## 2020-10-15 NOTE — Telephone Encounter (Signed)
Called spoke with patient's wife.  She is going to talk with her Husband and call us back if they want to make an appointment.   Nothing further needed at this time.

## 2020-10-18 MED FILL — ONDANSETRON HCL 4 MG TABS: 4 | 20 days supply | Qty: 60 | Fill #1

## 2020-10-22 ENCOUNTER — Other Ambulatory Visit (HOSPITAL_COMMUNITY): Payer: Self-pay | Admitting: Family Medicine

## 2020-10-22 DIAGNOSIS — Z0000 Encounter for general adult medical examination without abnormal findings: Secondary | ICD-10-CM | POA: Diagnosis not present

## 2020-10-22 DIAGNOSIS — R5383 Other fatigue: Secondary | ICD-10-CM | POA: Diagnosis not present

## 2020-10-22 DIAGNOSIS — K2970 Gastritis, unspecified, without bleeding: Secondary | ICD-10-CM | POA: Diagnosis not present

## 2020-10-22 DIAGNOSIS — Z79899 Other long term (current) drug therapy: Secondary | ICD-10-CM | POA: Diagnosis not present

## 2020-10-22 DIAGNOSIS — Z86010 Personal history of colonic polyps: Secondary | ICD-10-CM | POA: Diagnosis not present

## 2020-10-22 DIAGNOSIS — F5101 Primary insomnia: Secondary | ICD-10-CM | POA: Diagnosis not present

## 2020-10-22 DIAGNOSIS — K5904 Chronic idiopathic constipation: Secondary | ICD-10-CM | POA: Diagnosis not present

## 2020-10-22 DIAGNOSIS — K219 Gastro-esophageal reflux disease without esophagitis: Secondary | ICD-10-CM | POA: Diagnosis not present

## 2020-10-22 DIAGNOSIS — I700 Atherosclerosis of aorta: Secondary | ICD-10-CM | POA: Diagnosis not present

## 2020-10-22 DIAGNOSIS — E1169 Type 2 diabetes mellitus with other specified complication: Secondary | ICD-10-CM | POA: Diagnosis not present

## 2020-10-22 DIAGNOSIS — Z6822 Body mass index (BMI) 22.0-22.9, adult: Secondary | ICD-10-CM | POA: Diagnosis not present

## 2020-10-22 MED FILL — METOCLOPRAMIDE 5 MG TABLET: 5 | 30 days supply | Qty: 60 | Fill #0

## 2020-10-25 ENCOUNTER — Other Ambulatory Visit (HOSPITAL_COMMUNITY): Payer: Self-pay | Admitting: Family Medicine

## 2020-10-25 DIAGNOSIS — K3184 Gastroparesis: Secondary | ICD-10-CM

## 2020-10-30 ENCOUNTER — Telehealth: Payer: Self-pay | Admitting: Internal Medicine

## 2020-10-30 NOTE — Telephone Encounter (Signed)
Hi Dr. Carlean Purl,  We received a referral from PCP Dr. Kathyrn Lass for this patient to be seen for gastroparesis, constipation and fatigue. Patient is a current patient with Dr. Wilford Corner.  Patient is requesting a transfer of care states he is not satisfied with their office.  Please advise on scheduling.  Thank you

## 2020-11-04 NOTE — Telephone Encounter (Signed)
Called patient to schedule left voicemail. 

## 2020-11-05 ENCOUNTER — Ambulatory Visit (HOSPITAL_COMMUNITY)
Admission: RE | Admit: 2020-11-05 | Discharge: 2020-11-05 | Disposition: A | Discharge status: Home / Self Care | Payer: 59 | Source: Ambulatory Visit | Attending: Family Medicine | Admitting: Family Medicine

## 2020-11-05 ENCOUNTER — Other Ambulatory Visit: Payer: Self-pay

## 2020-11-05 ENCOUNTER — Other Ambulatory Visit (HOSPITAL_COMMUNITY): Payer: Self-pay | Admitting: Endocrinology

## 2020-11-05 DIAGNOSIS — R109 Unspecified abdominal pain: Secondary | ICD-10-CM | POA: Diagnosis not present

## 2020-11-05 DIAGNOSIS — K3184 Gastroparesis: Secondary | ICD-10-CM | POA: Insufficient documentation

## 2020-11-05 DIAGNOSIS — R112 Nausea with vomiting, unspecified: Secondary | ICD-10-CM | POA: Diagnosis not present

## 2020-11-05 MED ORDER — TECHNETIUM TC 99M SULFUR COLLOID
2.0000 | Freq: Once | INTRAVENOUS | Status: AC | PRN
  Administered 2020-11-05: 2 via INTRAVENOUS

## 2020-11-05 MED FILL — GLIMEPIRIDE 4 MG TABS: 4 | 90 days supply | Qty: 180 | Fill #0

## 2020-11-19 MED FILL — JANUVIA 100 MG TABLET: 100 | 30 days supply | Qty: 30 | Fill #4

## 2020-11-25 DIAGNOSIS — K3184 Gastroparesis: Secondary | ICD-10-CM | POA: Diagnosis not present

## 2020-11-25 DIAGNOSIS — E1165 Type 2 diabetes mellitus with hyperglycemia: Secondary | ICD-10-CM | POA: Diagnosis not present

## 2020-11-25 DIAGNOSIS — I10 Essential (primary) hypertension: Secondary | ICD-10-CM | POA: Diagnosis not present

## 2020-11-25 DIAGNOSIS — E7800 Pure hypercholesterolemia, unspecified: Secondary | ICD-10-CM | POA: Diagnosis not present

## 2020-12-09 ENCOUNTER — Other Ambulatory Visit (HOSPITAL_BASED_OUTPATIENT_CLINIC_OR_DEPARTMENT_OTHER): Payer: Self-pay

## 2020-12-17 MED FILL — JANUVIA 100 MG TABLET: 100 | 30 days supply | Qty: 30 | Fill #5

## 2020-12-19 MED FILL — METOPROLOL SUCCINATE ER 25: 25 | 90 days supply | Qty: 90 | Fill #2

## 2020-12-31 ENCOUNTER — Other Ambulatory Visit (HOSPITAL_COMMUNITY): Payer: Self-pay

## 2020-12-31 ENCOUNTER — Ambulatory Visit (INDEPENDENT_AMBULATORY_CARE_PROVIDER_SITE_OTHER): Payer: 59 | Admitting: Internal Medicine

## 2020-12-31 ENCOUNTER — Encounter: Payer: Self-pay | Admitting: Internal Medicine

## 2020-12-31 ENCOUNTER — Other Ambulatory Visit: Payer: Self-pay

## 2020-12-31 VITALS — BP 130/60 | HR 73 | Ht 72.0 in | Wt 163.5 lb

## 2020-12-31 DIAGNOSIS — Z86010 Personal history of colonic polyps: Secondary | ICD-10-CM | POA: Diagnosis not present

## 2020-12-31 DIAGNOSIS — K224 Dyskinesia of esophagus: Secondary | ICD-10-CM | POA: Diagnosis not present

## 2020-12-31 DIAGNOSIS — R110 Nausea: Secondary | ICD-10-CM

## 2020-12-31 DIAGNOSIS — K222 Esophageal obstruction: Secondary | ICD-10-CM

## 2020-12-31 DIAGNOSIS — Z9889 Other specified postprocedural states: Secondary | ICD-10-CM

## 2020-12-31 DIAGNOSIS — K5909 Other constipation: Secondary | ICD-10-CM

## 2020-12-31 DIAGNOSIS — G8929 Other chronic pain: Secondary | ICD-10-CM | POA: Diagnosis not present

## 2020-12-31 DIAGNOSIS — K6289 Other specified diseases of anus and rectum: Secondary | ICD-10-CM

## 2020-12-31 DIAGNOSIS — M545 Low back pain, unspecified: Secondary | ICD-10-CM | POA: Diagnosis not present

## 2020-12-31 DIAGNOSIS — M4714 Other spondylosis with myelopathy, thoracic region: Secondary | ICD-10-CM

## 2020-12-31 DIAGNOSIS — R32 Unspecified urinary incontinence: Secondary | ICD-10-CM

## 2020-12-31 DIAGNOSIS — F5104 Psychophysiologic insomnia: Secondary | ICD-10-CM

## 2020-12-31 MED ORDER — MIRTAZAPINE 15 MG PO TABS
15.0000 mg | ORAL_TABLET | Freq: Every day | ORAL | 0 refills | Status: DC
Start: 2020-12-31 — End: 2021-04-08
  Filled 2020-12-31: qty 90, 90d supply, fill #0

## 2020-12-31 NOTE — Patient Instructions (Addendum)
You will be contacted by [facility] in the next 2 days to arrange a Barium Swallow.  The number on your caller ID will be [phone], please answer when they call.  If you have not heard from them in 2 days please call [phone] to schedule.     We have sent the following medications to your pharmacy for you to pick up at your convenience: Mirtazapine, don't start this until 01/03/21  Stop your trazodone.  We have placed a referral for pelvic floor physical therapy. They will contact you about an appointment.  We will work on a referral to physical medicine and rehab.  You have been scheduled for an esophageal manometry test at [facility] on 01/15/2021 at 12:30pm. Please arrive 30 minutes prior to your procedure for registration. You will need to go to outpatient registration (1st floor of the hospital) first. Make certain to bring your insurance cards as well as a complete list of medications.  Please remember the following:  1) Do not take any muscle relaxants, xanax (alprazolam) or ativan for 1 day prior to your test as well as the day of the test.  2) Nothing to eat or drink after 12:00 midnight on the night before your test.  3) Hold all diabetic medications/insulin the morning of the test. You may eat and take your medications after the test.  It will take at least 2 weeks to receive the results of this test from your physician.  ------------------------------------------ ABOUT ESOPHAGEAL MANOMETRY Esophageal manometry (muh-NOM-uh-tree) is a test that gauges how well your esophagus works. Your esophagus is the long, muscular tube that connects your throat to your stomach. Esophageal manometry measures the rhythmic muscle contractions (peristalsis) that occur in your esophagus when you swallow. Esophageal manometry also measures the coordination and force exerted by the muscles of your esophagus.  During esophageal manometry, a thin, flexible tube  (catheter) that contains sensors is passed through your nose, down your esophagus and into your stomach. Esophageal manometry can be helpful in diagnosing some mostly uncommon disorders that affect your esophagus.  Why it's done Esophageal manometry is used to evaluate the movement (motility) of food through the esophagus and into the stomach. The test measures how well the circular bands of muscle (sphincters) at the top and bottom of your esophagus open and close, as well as the pressure, strength and pattern of the wave of esophageal muscle contractions that moves food along.  What you can expect Esophageal manometry is an outpatient procedure done without sedation. Most people tolerate it well. You may be asked to change into a hospital gown before the test starts.  During esophageal manometry  . While you are sitting up, a member of your health care team sprays your throat with a numbing medication or puts numbing gel in your nose or both.  . A catheter is guided through your nose into your esophagus. The catheter may be sheathed in a water-filled sleeve. It doesn't interfere with your breathing. However, your eyes may water, and you may gag. You may have a slight nosebleed from irritation.  . After the catheter is in place, you may be asked to lie on your back on an exam table, or you may be asked to remain seated.  . You then swallow small sips of water. As you do, a computer connected to the catheter records the pressure, strength and pattern of your esophageal muscle contractions.  . During the test, you'll be asked to breathe  slowly and smoothly, remain as still as possible, and swallow only when you're asked to do so.  . A member of your health care team may move the catheter down into your stomach while the catheter continues its measurements.  . The catheter then is slowly withdrawn. The test usually lasts 20 to 30 minutes.  After esophageal manometry  When your esophageal manometry is  complete, you may return to your normal activities  This test typically takes 30-45 minutes to complete. ________________________________________________________________________________   I appreciate the opportunity to care for you. Silvano Rusk, MD, [facility]
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
## 2021-01-01 ENCOUNTER — Encounter: Payer: Self-pay | Admitting: Internal Medicine

## 2021-01-01 DIAGNOSIS — K5909 Other constipation: Secondary | ICD-10-CM | POA: Insufficient documentation

## 2021-01-01 DIAGNOSIS — R32 Unspecified urinary incontinence: Secondary | ICD-10-CM | POA: Insufficient documentation

## 2021-01-01 DIAGNOSIS — R110 Nausea: Secondary | ICD-10-CM | POA: Insufficient documentation

## 2021-01-01 DIAGNOSIS — F5104 Psychophysiologic insomnia: Secondary | ICD-10-CM | POA: Insufficient documentation

## 2021-01-01 DIAGNOSIS — K6289 Other specified diseases of anus and rectum: Secondary | ICD-10-CM | POA: Insufficient documentation

## 2021-01-01 DIAGNOSIS — K222 Esophageal obstruction: Secondary | ICD-10-CM | POA: Insufficient documentation

## 2021-01-01 DIAGNOSIS — Z9889 Other specified postprocedural states: Secondary | ICD-10-CM | POA: Insufficient documentation

## 2021-01-01 DIAGNOSIS — M545 Low back pain, unspecified: Secondary | ICD-10-CM | POA: Insufficient documentation

## 2021-01-01 DIAGNOSIS — G8929 Other chronic pain: Secondary | ICD-10-CM | POA: Insufficient documentation

## 2021-01-02 ENCOUNTER — Telehealth: Payer: Self-pay

## 2021-01-02 NOTE — Telephone Encounter (Signed)
I have messaged one of the physicians in the physical medicine practice to sort through this.  Stay tuned

## 2021-01-02 NOTE — Telephone Encounter (Signed)
-----   Message from Gatha Mayer, MD sent at 01/01/2021  6:18 PM EDT ----- Regarding: Referral to physical medicine Please refer him to physical medicine and rehabilitation ([facility]) regarding numerous spine surgeries and thoracic myelopathy these evaluate and treat

## 2021-01-02 NOTE — Telephone Encounter (Signed)
I called the [facility] physical medicine and rehabilitation center at [phone]. They said since he has had spinal surgery with Dr Saintclair Halsted that he needs to follow up with them before any appointments can be made. I  have not called [facility] yet, please advise Sir.

## 2021-01-03 ENCOUNTER — Other Ambulatory Visit (HOSPITAL_COMMUNITY): Payer: Self-pay

## 2021-01-13 ENCOUNTER — Other Ambulatory Visit (HOSPITAL_COMMUNITY): Payer: Self-pay

## 2021-01-13 MED FILL — Glimepiride Tab 4 MG: ORAL | 90 days supply | Qty: 180 | Fill #0 | Status: AC

## 2021-01-14 ENCOUNTER — Telehealth: Payer: Self-pay

## 2021-01-14 NOTE — Telephone Encounter (Signed)
I spoke with Mr Bugaj wife Mariann Laster and confirmed the address to send the new instructions to for his esophageal mano and COVID testing. His test had to be r/s'ed because I had failed to set up his Covid testing appointment. I apologized for this.

## 2021-01-16 ENCOUNTER — Other Ambulatory Visit (HOSPITAL_COMMUNITY): Payer: Self-pay

## 2021-01-20 ENCOUNTER — Other Ambulatory Visit (HOSPITAL_COMMUNITY): Payer: Self-pay

## 2021-01-20 ENCOUNTER — Other Ambulatory Visit: Payer: Self-pay | Admitting: Internal Medicine

## 2021-01-20 DIAGNOSIS — R911 Solitary pulmonary nodule: Secondary | ICD-10-CM

## 2021-01-20 MED ORDER — METFORMIN HCL ER 500 MG PO TB24
500.0000 mg | ORAL_TABLET | Freq: Two times a day (BID) | ORAL | 6 refills | Status: DC
  Filled 2021-01-20: qty 60, 30d supply, fill #0
  Filled 2021-04-20: qty 60, 30d supply, fill #1
  Filled 2021-05-26: qty 60, 30d supply, fill #2
  Filled 2021-09-29: qty 60, 30d supply, fill #3
  Filled 2021-12-09: qty 60, 30d supply, fill #4

## 2021-01-20 MED ORDER — JANUVIA 100 MG PO TABS
100.0000 mg | ORAL_TABLET | Freq: Every day | ORAL | 5 refills | Status: DC
  Filled 2021-01-20: qty 30, 30d supply, fill #0
  Filled 2021-02-25: qty 30, 30d supply, fill #1
  Filled 2021-04-07: qty 30, 30d supply, fill #2
  Filled 2021-04-29: qty 30, 30d supply, fill #3
  Filled 2021-05-28: qty 30, 30d supply, fill #4

## 2021-01-21 ENCOUNTER — Other Ambulatory Visit: Payer: Self-pay | Admitting: Internal Medicine

## 2021-01-21 ENCOUNTER — Ambulatory Visit (HOSPITAL_COMMUNITY)
Admission: RE | Admit: 2021-01-21 | Discharge: 2021-01-21 | Disposition: A | Discharge status: Home / Self Care | Payer: 59 | Source: Ambulatory Visit | Attending: Internal Medicine | Admitting: Internal Medicine

## 2021-01-21 ENCOUNTER — Other Ambulatory Visit: Payer: Self-pay

## 2021-01-21 DIAGNOSIS — K224 Dyskinesia of esophagus: Secondary | ICD-10-CM | POA: Diagnosis not present

## 2021-01-21 DIAGNOSIS — R1310 Dysphagia, unspecified: Secondary | ICD-10-CM | POA: Diagnosis not present

## 2021-01-23 DIAGNOSIS — R102 Pelvic and perineal pain: Secondary | ICD-10-CM | POA: Diagnosis not present

## 2021-01-23 DIAGNOSIS — N3941 Urge incontinence: Secondary | ICD-10-CM | POA: Diagnosis not present

## 2021-01-24 ENCOUNTER — Other Ambulatory Visit (HOSPITAL_COMMUNITY)
Admission: RE | Admit: 2021-01-24 | Discharge: 2021-01-24 | Disposition: A | Discharge status: Home / Self Care | Payer: 59 | Source: Ambulatory Visit | Attending: Internal Medicine | Admitting: Internal Medicine

## 2021-01-24 DIAGNOSIS — Z01812 Encounter for preprocedural laboratory examination: Secondary | ICD-10-CM | POA: Insufficient documentation

## 2021-01-24 DIAGNOSIS — Z20822 Contact with and (suspected) exposure to covid-19: Secondary | ICD-10-CM | POA: Diagnosis not present

## 2021-01-24 LAB — SARS CORONAVIRUS 2 (TAT 6-24 HRS): SARS Coronavirus 2: NEGATIVE

## 2021-01-29 ENCOUNTER — Encounter (HOSPITAL_COMMUNITY)
Admission: RE | Disposition: A | Discharge status: Home / Self Care | Payer: Self-pay | Source: Home / Self Care | Attending: Internal Medicine

## 2021-01-29 ENCOUNTER — Encounter (HOSPITAL_COMMUNITY): Payer: Self-pay | Admitting: Internal Medicine

## 2021-01-29 ENCOUNTER — Ambulatory Visit (HOSPITAL_COMMUNITY)
Admission: RE | Admit: 2021-01-29 | Discharge: 2021-01-29 | Disposition: A | Discharge status: Home / Self Care | Payer: 59 | Attending: Internal Medicine | Admitting: Internal Medicine

## 2021-01-29 DIAGNOSIS — R1319 Other dysphagia: Secondary | ICD-10-CM

## 2021-01-29 DIAGNOSIS — R1310 Dysphagia, unspecified: Secondary | ICD-10-CM | POA: Diagnosis not present

## 2021-01-29 DIAGNOSIS — R112 Nausea with vomiting, unspecified: Secondary | ICD-10-CM | POA: Diagnosis not present

## 2021-01-29 HISTORY — PX: ESOPHAGEAL MANOMETRY: SHX5429

## 2021-01-29 SURGERY — MANOMETRY, ESOPHAGUS

## 2021-01-29 MED ORDER — LIDOCAINE VISCOUS HCL 2 % MT SOLN
OROMUCOSAL | Status: AC
  Filled 2021-01-29: qty 15

## 2021-01-29 SURGICAL SUPPLY — 2 items
FACESHIELD LNG OPTICON STERILE (SAFETY) IMPLANT
GLOVE BIO SURGEON STRL SZ8 (GLOVE) ×4 IMPLANT

## 2021-01-29 NOTE — Progress Notes (Signed)
Esophageal manometry performed per protocol without complications.  Patient tolerated well. 

## 2021-01-30 ENCOUNTER — Other Ambulatory Visit (HOSPITAL_COMMUNITY): Payer: Self-pay

## 2021-01-30 MED ORDER — PANTOPRAZOLE SODIUM 40 MG PO TBEC
40.0000 mg | DELAYED_RELEASE_TABLET | Freq: Two times a day (BID) | ORAL | 1 refills | Status: DC
  Filled 2021-01-30: qty 180, 90d supply, fill #0

## 2021-02-13 DIAGNOSIS — R1319 Other dysphagia: Secondary | ICD-10-CM

## 2021-02-25 ENCOUNTER — Other Ambulatory Visit (HOSPITAL_COMMUNITY): Payer: Self-pay

## 2021-03-19 DIAGNOSIS — N3941 Urge incontinence: Secondary | ICD-10-CM | POA: Diagnosis not present

## 2021-04-07 ENCOUNTER — Other Ambulatory Visit (HOSPITAL_COMMUNITY): Payer: Self-pay

## 2021-04-07 MED FILL — Glimepiride Tab 4 MG: ORAL | 90 days supply | Qty: 180 | Fill #1 | Status: AC

## 2021-04-08 ENCOUNTER — Encounter: Payer: Self-pay | Admitting: Internal Medicine

## 2021-04-08 ENCOUNTER — Other Ambulatory Visit (HOSPITAL_COMMUNITY): Payer: Self-pay

## 2021-04-08 ENCOUNTER — Ambulatory Visit (INDEPENDENT_AMBULATORY_CARE_PROVIDER_SITE_OTHER): Payer: 59 | Admitting: Internal Medicine

## 2021-04-08 VITALS — BP 136/70 | HR 72 | Ht 72.0 in | Wt 172.0 lb

## 2021-04-08 DIAGNOSIS — R32 Unspecified urinary incontinence: Secondary | ICD-10-CM

## 2021-04-08 DIAGNOSIS — R110 Nausea: Secondary | ICD-10-CM

## 2021-04-08 DIAGNOSIS — K5909 Other constipation: Secondary | ICD-10-CM

## 2021-04-08 DIAGNOSIS — K222 Esophageal obstruction: Secondary | ICD-10-CM | POA: Diagnosis not present

## 2021-04-08 DIAGNOSIS — K30 Functional dyspepsia: Secondary | ICD-10-CM | POA: Diagnosis not present

## 2021-04-08 DIAGNOSIS — R198 Other specified symptoms and signs involving the digestive system and abdomen: Secondary | ICD-10-CM

## 2021-04-08 DIAGNOSIS — E1165 Type 2 diabetes mellitus with hyperglycemia: Secondary | ICD-10-CM | POA: Diagnosis not present

## 2021-04-08 DIAGNOSIS — R0989 Other specified symptoms and signs involving the circulatory and respiratory systems: Secondary | ICD-10-CM

## 2021-04-08 DIAGNOSIS — Z86010 Personal history of colonic polyps: Secondary | ICD-10-CM

## 2021-04-08 MED ORDER — BUSPIRONE HCL 10 MG PO TABS
10.0000 mg | ORAL_TABLET | Freq: Three times a day (TID) | ORAL | 1 refills | Status: DC
  Filled 2021-04-08: qty 270, 90d supply, fill #0

## 2021-04-08 MED ORDER — MIRTAZAPINE 30 MG PO TABS
30.0000 mg | ORAL_TABLET | Freq: Every day | ORAL | 1 refills | Status: DC
Start: 2021-04-08 — End: 2021-06-25
  Filled 2021-04-08: qty 90, 90d supply, fill #0

## 2021-04-08 NOTE — Patient Instructions (Signed)
You have been scheduled for an endoscopy and colonoscopy. Please follow the written instructions given to you at your visit today. Please pick up your prep supplies at the pharmacy within the next 1-3 days. If you use inhalers (even only as needed), please bring them with you on the day of your procedure.  Stop drinking juice per Dr Carlean Purl.  We have sent the following medications to your pharmacy for you to pick up at your convenience: Mirtazapine - Dr Carlean Purl is increasing the dose on this and he also sent in buspirone.  We have placed a referral for pelvic floor physical therapy. Reach out to them if you don't hear from them soon.  I appreciate the opportunity to care for you. Silvano Rusk, MD, [facility]
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
## 2021-04-14 ENCOUNTER — Other Ambulatory Visit: Payer: Self-pay

## 2021-04-14 ENCOUNTER — Ambulatory Visit
Admission: RE | Admit: 2021-04-14 | Discharge: 2021-04-14 | Disposition: A | Discharge status: Home / Self Care | Payer: 59 | Source: Ambulatory Visit | Attending: Internal Medicine | Admitting: Internal Medicine

## 2021-04-14 DIAGNOSIS — J439 Emphysema, unspecified: Secondary | ICD-10-CM | POA: Diagnosis not present

## 2021-04-14 DIAGNOSIS — I700 Atherosclerosis of aorta: Secondary | ICD-10-CM | POA: Diagnosis not present

## 2021-04-14 DIAGNOSIS — R911 Solitary pulmonary nodule: Secondary | ICD-10-CM | POA: Diagnosis not present

## 2021-04-16 NOTE — Progress Notes (Signed)
Spoke with pt and notified of results per Dr. Wert. Pt verbalized understanding and denied any questions. 

## 2021-04-21 ENCOUNTER — Other Ambulatory Visit (HOSPITAL_COMMUNITY): Payer: Self-pay

## 2021-04-23 ENCOUNTER — Other Ambulatory Visit: Payer: Self-pay | Admitting: Interventional Cardiology

## 2021-04-23 ENCOUNTER — Other Ambulatory Visit (HOSPITAL_COMMUNITY): Payer: Self-pay

## 2021-04-23 MED ORDER — METOPROLOL SUCCINATE ER 25 MG PO TB24
25.0000 mg | ORAL_TABLET | Freq: Every day | ORAL | 0 refills | Status: DC
  Filled 2021-04-23: qty 30, 30d supply, fill #0

## 2021-04-29 ENCOUNTER — Other Ambulatory Visit (HOSPITAL_COMMUNITY): Payer: Self-pay

## 2021-04-29 NOTE — Telephone Encounter (Signed)
Patient is scheduled for pelvic floor rehab 06/02/21 at the [place] location.

## 2021-05-01 ENCOUNTER — Other Ambulatory Visit (HOSPITAL_COMMUNITY): Payer: Self-pay

## 2021-05-01 DIAGNOSIS — E1165 Type 2 diabetes mellitus with hyperglycemia: Secondary | ICD-10-CM | POA: Diagnosis not present

## 2021-05-01 MED ORDER — JARDIANCE 10 MG PO TABS
10.0000 mg | ORAL_TABLET | Freq: Every day | ORAL | 6 refills | Status: DC
  Filled 2021-05-01: qty 30, 30d supply, fill #0
  Filled 2021-05-30: qty 30, 30d supply, fill #1
  Filled 2021-05-31: qty 30, 30d supply, fill #2

## 2021-05-14 DIAGNOSIS — I10 Essential (primary) hypertension: Secondary | ICD-10-CM | POA: Diagnosis not present

## 2021-05-14 DIAGNOSIS — E1165 Type 2 diabetes mellitus with hyperglycemia: Secondary | ICD-10-CM | POA: Diagnosis not present

## 2021-05-14 DIAGNOSIS — K3184 Gastroparesis: Secondary | ICD-10-CM | POA: Diagnosis not present

## 2021-05-14 DIAGNOSIS — E7800 Pure hypercholesterolemia, unspecified: Secondary | ICD-10-CM | POA: Diagnosis not present

## 2021-05-26 ENCOUNTER — Other Ambulatory Visit (HOSPITAL_COMMUNITY): Payer: Self-pay

## 2021-05-27 ENCOUNTER — Other Ambulatory Visit (HOSPITAL_COMMUNITY): Payer: Self-pay

## 2021-05-28 ENCOUNTER — Other Ambulatory Visit (HOSPITAL_COMMUNITY): Payer: Self-pay

## 2021-05-28 MED FILL — Glimepiride Tab 4 MG: ORAL | 90 days supply | Qty: 180 | Fill #2 | Status: CN

## 2021-05-29 ENCOUNTER — Other Ambulatory Visit (HOSPITAL_COMMUNITY): Payer: Self-pay

## 2021-05-29 MED ORDER — GLIMEPIRIDE 4 MG PO TABS
ORAL_TABLET | ORAL | 5 refills | Status: DC
  Filled 2021-05-29 – 2021-06-16 (×2): qty 180, 90d supply, fill #0

## 2021-05-30 ENCOUNTER — Other Ambulatory Visit (HOSPITAL_COMMUNITY): Payer: Self-pay

## 2021-06-02 ENCOUNTER — Other Ambulatory Visit (HOSPITAL_COMMUNITY): Payer: Self-pay

## 2021-06-02 ENCOUNTER — Encounter: Payer: Self-pay | Admitting: Physical Therapy

## 2021-06-02 ENCOUNTER — Ambulatory Visit: Payer: 59 | Attending: Internal Medicine | Admitting: Physical Therapy

## 2021-06-02 ENCOUNTER — Other Ambulatory Visit: Payer: Self-pay

## 2021-06-02 DIAGNOSIS — R279 Unspecified lack of coordination: Secondary | ICD-10-CM | POA: Insufficient documentation

## 2021-06-02 DIAGNOSIS — M6281 Muscle weakness (generalized): Secondary | ICD-10-CM | POA: Insufficient documentation

## 2021-06-02 NOTE — Therapy (Signed)
[facility] [address] [place], [place], [address] Phone: [phone]   Fax:  [phone]  Physical Therapy Evaluation  Patient Details  Name: Justin Key MRN: [id number] Date of Birth: 26-Dec-1950 Referring Provider (PT): Gatha Mayer, MD   Encounter Date: 06/02/2021   PT End of Session - 06/02/21 1743     Visit Number 1    Date for PT Re-Evaluation 08/25/21    Authorization Type [employer] employee    PT Start Time 1619    PT Stop Time 1658    PT Time Calculation (min) 39 min    Activity Tolerance Patient tolerated treatment well    Behavior During Therapy [facility] for tasks assessed/performed             Past Medical History:  Diagnosis Date   Adenomatous polyps    Back pain    Barrett's esophagus    Coronary artery disease    Diabetes mellitus without complication (HCC)    Failure to thrive in adult    Gastroparesis    GERD (gastroesophageal reflux disease)    Hyperlipidemia    Hypertension    Lumbar disc disease    Lumbar spinal stenosis    Myocardial infarction [facility]) 1997   Thoracic myelopathy 2020    Past Surgical History:  Procedure Laterality Date   BACK SURGERY     microdiskectomy x2   BIOPSY  06/21/2018   Procedure: BIOPSY;  Surgeon: Wilford Corner, MD;  [facility];  Service: Endoscopy;;   BIOPSY  02/06/2020   Procedure: BIOPSY;  Surgeon: Wilford Corner, MD;  [facility];  Service: Endoscopy;;   COLONOSCOPY  07/12/2012   Procedure: COLONOSCOPY;  Surgeon: Lear Ng, MD;  [facility];  Service: Endoscopy;  Laterality: N/A;   COLONOSCOPY WITH PROPOFOL N/A 06/21/2018   Procedure: COLONOSCOPY WITH PROPOFOL;  Surgeon: Wilford Corner, MD;  [facility];  Service: Endoscopy;  Laterality: N/A;   COLONOSCOPY WITH PROPOFOL N/A 02/06/2020   Procedure: COLONOSCOPY WITH PROPOFOL;  Surgeon: Wilford Corner, MD;  [facility];  Service: Endoscopy;  Laterality: N/A;   ESOPHAGEAL MANOMETRY N/A 11/15/2017   Procedure: ESOPHAGEAL MANOMETRY (EM);  Surgeon: Wilford Corner, MD;  [facility];  Service: Endoscopy;  Laterality: N/A;   ESOPHAGEAL MANOMETRY  01/29/2021   Procedure: ESOPHAGEAL MANOMETRY (EM);  Surgeon: Gatha Mayer, MD;  [facility];  Service: Endoscopy;;   ESOPHAGOGASTRODUODENOSCOPY  07/12/2012   Procedure: ESOPHAGOGASTRODUODENOSCOPY (EGD);  Surgeon: Lear Ng, MD;  [facility];  Service: Endoscopy;  Laterality: N/A;   ESOPHAGOGASTRODUODENOSCOPY (EGD) WITH PROPOFOL N/A 06/21/2018   Procedure: ESOPHAGOGASTRODUODENOSCOPY (EGD) WITH PROPOFOL;  Surgeon: Wilford Corner, MD;  [facility];  Service: Endoscopy;  Laterality: N/A;   ESOPHAGOGASTRODUODENOSCOPY (EGD) WITH PROPOFOL N/A 02/06/2020   Procedure: ESOPHAGOGASTRODUODENOSCOPY (EGD) WITH PROPOFOL;  Surgeon: Wilford Corner, MD;  [facility];  Service: Endoscopy;  Laterality: N/A;   FOREIGN BODY RETRIEVAL N/A 02/06/2020   Procedure: FOREIGN BODY RETRIEVAL;  Surgeon: Wilford Corner, MD;  [facility];  Service: Endoscopy;  Laterality: N/A;   INTRAVASCULAR ULTRASOUND/IVUS N/A 08/24/2018   Procedure: Intravascular Ultrasound/IVUS;  Surgeon: Belva Crome, MD;  Location: [place] [facility];  Service: Cardiovascular;  Laterality: N/A;   KNEE ARTHROSCOPY  1988   Left   LEFT HEART CATH AND CORONARY ANGIOGRAPHY N/A 08/24/2018   Procedure: LEFT HEART CATH AND CORONARY ANGIOGRAPHY;  Surgeon: Belva Crome, MD;  Location: [place]  CV [facility];  Service: Cardiovascular;  Laterality: N/A;   POLYPECTOMY  06/21/2018   Procedure: POLYPECTOMY;  Surgeon: Wilford Corner, MD;  [facility];  Service: Endoscopy;;   POLYPECTOMY  02/06/2020   Procedure: POLYPECTOMY;  Surgeon: Wilford Corner, MD;  [facility];  Service: Endoscopy;;   SPINAL CORD STIMULATOR IMPLANT      SPINAL CORD STIMULATOR REMOVAL     SPINAL FUSION     x 3   THORACIC DISCECTOMY N/A 03/15/2019   Procedure: Laminectomy and Foraminotomy - Thoracic eleven -twelve;  Surgeon: Kary Kos, MD;  Location: [place];  Service: Neurosurgery;  Laterality: N/A;    There were no vitals filed for this visit.    Subjective Assessment - 06/02/21 1624     Subjective Pt states he has constipation since  being on pain medicine and back surgeries.  Pt states fiber helps and try not to strain. Pt has a BM every 3 days . Normally it was daily but that was 12 years ago.  I have more back pain with the constipation and feel like I am sitting on a rock when very constipation. Urinary leakage has been the last year because I don't have the feeling and don't know I am going and it is full bladder incontinence.  Urge and has to go immediately. 3 pads/day.    Pertinent History 6 back surgeries; chronic pain med, chronic back pain    Patient Stated Goals decrease leakage and improve bowel regularity    Currently in Pain? Yes    Pain Score 5    this is my normal level   Pain Location Back    Pain Orientation Lower    Pain Descriptors / Indicators Stabbing;Aching    Pain Type Chronic pain    Pain Onset More than a month ago    Multiple Pain Sites No                [facility] PT Assessment - 06/02/21 0001       Assessment   Medical Diagnosis K59.09 (ICD-10-CM) - Chronic constipation;R32 (ICD-10-CM) - Urinary incontinence, unspecified type    Referring Provider (PT) Gatha Mayer, MD    Prior Therapy No      Precautions   Precautions None      Balance Screen   Has the patient fallen in the past 6 months No      [place] residence    Living Arrangements Spouse/significant other      Prior Function   Level of Independence Independent    Leisure very sedentary      Cognition   Overall Cognitive Status Within Functional Limits for tasks assessed      Posture/Postural  Control   Posture/Postural Control Postural limitations    Postural Limitations Decreased lumbar lordosis;Decreased thoracic kyphosis;Rounded Shoulders;Flexed trunk   low thoracic rounding     ROM / Strength   AROM / PROM / Strength PROM;Strength      PROM   Overall PROM Comments hip flex 75% bil      Strength   Overall Strength Comments 4/5 LE and core      Flexibility   Soft Tissue Assessment /Muscle Length yes    Hamstrings 70% bil      Palpation   Palpation comment tight lubmar, gluteals      Ambulation/Gait   Assistive device 4-wheeled walker    Gait Pattern Decreased stride length;Trunk flexed  Objective measurements completed on examination: See above findings.     Pelvic Floor Special Questions - 06/02/21 0001     Urinary Leakage Yes    Pad use 4/day    Urinary frequency every 2 hours    Caffeine beverages 8 glasses/day at least    Exam Type Deferred   external exam   Palpation externally to clothing    Strength # of reps 3    Strength # of seconds 4                       PT Education - 06/02/21 1743     Education Details Access Code: [email or web address]    Person(s) Educated Patient    Methods Explanation;Demonstration;Tactile cues;Verbal cues;Handout    Comprehension Verbalized understanding;Returned demonstration                 PT Long Term Goals - 06/02/21 1736       PT LONG TERM GOAL #1   Title Pt will be ind with advanced HEP    Time 12    Period Weeks    Status New    Target Date 08/25/21      PT LONG TERM GOAL #2   Title Pt will report at least 4 BMs per week without straining    Time 12    Period Weeks    Status New    Target Date 08/25/21      PT LONG TERM GOAL #3   Title Pt will report he can tell when he has to urinate and not having leakage that he is unaware of    Time 12    Period Weeks    Status New    Target Date 08/25/21      PT LONG TERM GOAL #4   Title Pt will report  at least 50% less urinary leakge due to improved strength of the pelvic floor    Time 12    Period Weeks    Status New    Target Date 08/25/21                    Plan - 06/02/21 1744     Clinical Impression Statement Pt presents to skilled PT due to urinary leakage and constipation. Pt has history of 6 back surgeries with complex chronic low back pain. Pt ambulates with 4 wheeled walker and needs UE support at all times. Pt has flexed trunk and weakness of abomen.  Pt has weakness in LE and tight hamstrings and hip flexion.  Pt is able to engage pelvic floor for up to 4 seconds. pt demosntrates flat lumbar spine and lower thoracic region demonstrates increased flexion.  Pt will benefit from skilled PT to address impairmentions mentioned above and improve bowel and bladder control    Personal Factors and Comorbidities Comorbidity 3+    Comorbidities back surgery x6 and chronic back pain    Examination-Activity Limitations Toileting;Continence    Examination-Participation Restrictions Community Activity    Stability/Clinical Decision Making Evolving/Moderate complexity    Clinical Decision Making Moderate    Rehab Potential Good    PT Frequency 1x / week    PT Duration 12 weeks    PT Treatment/Interventions ADLs/Self Care Home Management;Biofeedback;Cryotherapy;Electrical Stimulation;Moist Heat;Therapeutic activities;Therapeutic exercise;Neuromuscular re-education;Manual techniques;Taping;Passive range of motion;Dry needling    PT Next Visit Plan discuss biofeedback; f/u on initial HEP; pelvic floor and core strenght    PT Home Exercise Plan Access Code: [email or web address]  Consulted and Agree with Plan of Care Patient             Patient will benefit from skilled therapeutic intervention in order to improve the following deficits and impairments:  Abnormal gait, Pain, Postural dysfunction, Impaired flexibility, Increased fascial restricitons, Decreased strength, Decreased  coordination, Decreased endurance, Increased muscle spasms, Decreased activity tolerance  Visit Diagnosis: Muscle weakness (generalized)  Unspecified lack of coordination     Problem List Patient Active Problem List   Diagnosis Date Noted   Esophageal dysphagia    Chronic constipation 01/01/2021   Chronic nausea 01/01/2021   Urinary incontinence 01/01/2021   Chronic bilateral low back pain 01/01/2021   Esophagogastric junction outflow obstruction 01/01/2021   Personal history of multiple spine surgeries 01/01/2021   Chronic insomnia 01/01/2021   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, [place], [place], [address] Phone: [phone]   Fax:  [phone]  Name: Justin Key MRN: [id number] Date of Birth: Sep 22, 1950

## 2021-06-02 NOTE — Patient Instructions (Signed)
Access Code: [email or web address] URL: [email or web address] Date: 06/02/2021 Prepared by: Jari Favre  Exercises Supine Hip Internal and External Rotation - 1 x daily - 7 x weekly - 10 reps - 1 sets - 5 sec hold Supine Pelvic Floor Contraction - 3 x daily - 7 x weekly - 10 reps - 1 sets - 3 sec hold Seated Correct Posture - 1 x daily - 7 x weekly - 3 sets - 10 reps

## 2021-06-16 ENCOUNTER — Other Ambulatory Visit (HOSPITAL_COMMUNITY): Payer: Self-pay

## 2021-06-16 DIAGNOSIS — E1165 Type 2 diabetes mellitus with hyperglycemia: Secondary | ICD-10-CM | POA: Diagnosis not present

## 2021-06-16 MED ORDER — GLIMEPIRIDE 4 MG PO TABS
8.0000 mg | ORAL_TABLET | Freq: Every day | ORAL | 4 refills | Status: DC
  Filled 2021-06-16: qty 180, 90d supply, fill #0

## 2021-06-16 MED ORDER — TRESIBA FLEXTOUCH 100 UNIT/ML ~~LOC~~ SOPN
PEN_INJECTOR | SUBCUTANEOUS | 6 refills | Status: DC
  Filled 2021-06-16: qty 6, 75d supply, fill #0
  Filled 2021-09-02: qty 6, 75d supply, fill #1
  Filled 2021-11-03: qty 6, 56d supply, fill #2
  Filled 2022-02-15: qty 6, 56d supply, fill #3

## 2021-06-16 MED ORDER — INSULIN PEN NEEDLE 32G X 4 MM MISC
6 refills | Status: DC
  Filled 2021-06-16: qty 100, 90d supply, fill #0
  Filled 2021-08-29: qty 100, 90d supply, fill #1
  Filled 2021-12-16: qty 100, 90d supply, fill #2
  Filled 2022-03-04: qty 100, 90d supply, fill #3

## 2021-06-16 MED ORDER — JARDIANCE 10 MG PO TABS
10.0000 mg | ORAL_TABLET | Freq: Every day | ORAL | 4 refills | Status: DC
  Filled 2021-06-16: qty 90, 90d supply, fill #0
  Filled 2021-10-13: qty 90, 90d supply, fill #1
  Filled 2022-02-13: qty 90, 90d supply, fill #2
  Filled 2022-02-18 – 2022-05-30 (×3): qty 90, 90d supply, fill #3

## 2021-06-16 MED ORDER — METFORMIN HCL ER 500 MG PO TB24
500.0000 mg | ORAL_TABLET | Freq: Two times a day (BID) | ORAL | 4 refills | Status: DC
  Filled 2021-06-16: qty 180, 90d supply, fill #0

## 2021-06-16 MED ORDER — JANUVIA 100 MG PO TABS
100.0000 mg | ORAL_TABLET | Freq: Every day | ORAL | 4 refills | Status: DC
  Filled 2021-06-16: qty 30, 30d supply, fill #0
  Filled 2021-08-04: qty 30, 30d supply, fill #1
  Filled 2021-09-02: qty 30, 30d supply, fill #2
  Filled 2021-10-13: qty 30, 30d supply, fill #3
  Filled 2021-11-19: qty 30, 30d supply, fill #4
  Filled 2021-12-31: qty 30, 30d supply, fill #5
  Filled 2022-02-13: qty 30, 30d supply, fill #6
  Filled 2022-03-26: qty 30, 30d supply, fill #7
  Filled 2022-05-01: qty 30, 30d supply, fill #8
  Filled 2022-05-30 (×2): qty 30, 30d supply, fill #9

## 2021-06-18 ENCOUNTER — Other Ambulatory Visit (HOSPITAL_COMMUNITY): Payer: Self-pay

## 2021-06-18 MED ORDER — LIDOCAINE 5 % EX PTCH
MEDICATED_PATCH | CUTANEOUS | 11 refills | Status: AC
  Filled 2021-06-18: qty 60, 30d supply, fill #0

## 2021-06-19 ENCOUNTER — Other Ambulatory Visit (HOSPITAL_COMMUNITY): Payer: Self-pay

## 2021-06-21 ENCOUNTER — Other Ambulatory Visit (HOSPITAL_COMMUNITY): Payer: Self-pay

## 2021-06-23 ENCOUNTER — Other Ambulatory Visit (HOSPITAL_COMMUNITY): Payer: Self-pay

## 2021-06-24 ENCOUNTER — Telehealth: Payer: Self-pay | Admitting: Internal Medicine

## 2021-06-24 NOTE — Telephone Encounter (Signed)
Pt called stating he has not had a BM since starting 2-day prep yesterday. States he took 5 dulcolax tablets yesterday evening and another 5 tablets this morning and he has drank the 32oz of miralax per prep instructions and has not had a BM. Reports last BM was yesterday prior to taking prep medication. Procedure is scheduled with Dr. Carlean Purl 10/5 at 1:30. Please advise on what to instruct for pt.

## 2021-06-24 NOTE — Telephone Encounter (Signed)
Called pt to give instructions per Dr. Celesta Aver recommendations. Pt denies abdominal pain, bloating, swelling or distress. Will plan to proceed with fleets enema prior to prep this evening. Instructed pt that if he continues to not have a bowel movement after the enemas/prep to call back and speak with the MD on call. Pt verbalized understanding.

## 2021-06-24 NOTE — Telephone Encounter (Signed)
He should take 1-2 Fleet enemas before starting the prep tonight.  Hopefully that will get things started.  I would anticipate this will (hopefully) get things started and he can still clean out ok.  I am assuming he is not in abdominal distress - I.e. pain, etc.  As long as he is not having significant abdominal pain or other distress would proceed as planned after the enemas

## 2021-06-24 NOTE — Telephone Encounter (Signed)
Pt has procedure tomorrow - taking 2-day prep and still has not had BM.  Please call and speak to either him or his wife.  Thank you.

## 2021-06-25 ENCOUNTER — Other Ambulatory Visit: Payer: Self-pay

## 2021-06-25 ENCOUNTER — Ambulatory Visit (AMBULATORY_SURGERY_CENTER): Payer: 59 | Admitting: Internal Medicine

## 2021-06-25 ENCOUNTER — Encounter: Payer: Self-pay | Admitting: Internal Medicine

## 2021-06-25 ENCOUNTER — Other Ambulatory Visit (HOSPITAL_COMMUNITY): Payer: Self-pay

## 2021-06-25 VITALS — BP 165/85 | HR 68 | Temp 97.3°F | Resp 17 | Ht 72.0 in | Wt 172.0 lb

## 2021-06-25 DIAGNOSIS — D122 Benign neoplasm of ascending colon: Secondary | ICD-10-CM

## 2021-06-25 DIAGNOSIS — I2510 Atherosclerotic heart disease of native coronary artery without angina pectoris: Secondary | ICD-10-CM | POA: Diagnosis not present

## 2021-06-25 DIAGNOSIS — Z86010 Personal history of colonic polyps: Secondary | ICD-10-CM

## 2021-06-25 DIAGNOSIS — K449 Diaphragmatic hernia without obstruction or gangrene: Secondary | ICD-10-CM | POA: Diagnosis not present

## 2021-06-25 DIAGNOSIS — R0989 Other specified symptoms and signs involving the circulatory and respiratory systems: Secondary | ICD-10-CM

## 2021-06-25 DIAGNOSIS — E119 Type 2 diabetes mellitus without complications: Secondary | ICD-10-CM | POA: Diagnosis not present

## 2021-06-25 DIAGNOSIS — I10 Essential (primary) hypertension: Secondary | ICD-10-CM | POA: Diagnosis not present

## 2021-06-25 DIAGNOSIS — D123 Benign neoplasm of transverse colon: Secondary | ICD-10-CM

## 2021-06-25 MED ORDER — SODIUM CHLORIDE 0.9 % IV SOLN
4.0000 mg | Freq: Once | INTRAVENOUS | Status: AC
  Administered 2021-06-25: 4 mg via INTRAVENOUS

## 2021-06-25 MED ORDER — MIRTAZAPINE 15 MG PO TABS
15.0000 mg | ORAL_TABLET | Freq: Every day | ORAL | 3 refills | Status: DC
  Filled 2021-06-25: qty 90, 90d supply, fill #0
  Filled 2021-09-16: qty 90, 90d supply, fill #1
  Filled 2022-04-07: qty 90, 90d supply, fill #2

## 2021-06-25 MED ORDER — SODIUM CHLORIDE 0.9 % IV SOLN: 500.0000 mL | Freq: Once | INTRAVENOUS | Status: DC

## 2021-06-25 NOTE — Progress Notes (Signed)
Report to PACU, RN, vss, BBS= Clear.  

## 2021-06-25 NOTE — Progress Notes (Signed)
On arrival to PACU pt drowsy and passing air from colonoscopy. He did spit up thick, clear phlegm on arrival as well. Abdomen was soft. During his stay in PACU, pt had multiple episodes of vomiting/spitting up clear phlegm and one episode of vomiting up liquid. Zofran 4mg  given IV at 1444, per MD orders. Prior to Zofran, Levsin 0.25mg  SL given per protocol for gas pains but pt vomited them up before they could dissolve. Pt c/o pain in his abdomen but after further discussion with the MD it felt more like the chronic pain that he experiences in his back. MD palpated abdomen and it was soft. After Zofran given, pt continued to have episodes of vomiting clear phlegm; per MD pt has chronic nausea as well. Pt ambulated to bathroom with the RN and wife's assistance. Abdomen was slightly distended and pt was not able to pass air on the toilet. Ambulated back to the bed and sat on the side. Pt stated he wanted to get dressed and leave at this time. RN stated that she wanted to be sure the pt felt well enough to leave with the nausea and vomiting/heaving and slight distension of the abdomen. Pt states again that he is ready to leave. RN asked pt's wife if she felt comfortable with pt discharging at this time and she stated that she was. RN reviewed discharge instructions with pt and his wife. Reviewed signs and symptoms to notify the MD about. Also reviewed things to do at home to help with gas relief including: warm drinks, ambulation/repositioning, heating pad to abdomen, or using OTC GasX. Pt and his wife verbalized understanding of discharge instructions. Provided pt with emesis basins, urinal and tissues to travel home with.

## 2021-06-25 NOTE — Progress Notes (Signed)
Called to room to assist during endoscopic procedure.  Patient ID and intended procedure confirmed with present staff. Received instructions for my participation in the procedure from the performing physician.  

## 2021-06-25 NOTE — Op Note (Addendum)
[facility] Patient Name: Justin Key Procedure Date: 06/25/2021 12:40 PM MRN: [id number] Endoscopist: Gatha Mayer , MD Age: 70 Referring MD:  Date of Birth: 1951-07-22 Gender: Male Account #: [id number] Procedure:                Colonoscopy Indications:              Surveillance: History of adenomatous polyps,                            inadequate prep on last exam (<12yr) Medicines:                Propofol per Anesthesia, Monitored Anesthesia Care Procedure:                Pre-Anesthesia Assessment:                           - Prior to the procedure, a History and Physical                            was performed, and patient medications and                            allergies were reviewed. The patient's tolerance of                            previous anesthesia was also reviewed. The risks                            and benefits of the procedure and the sedation                            options and risks were discussed with the patient.                            All questions were answered, and informed consent                            was obtained. Prior Anticoagulants: The patient has                            taken no previous anticoagulant or antiplatelet                            agents. ASA Grade Assessment: III - A patient with                            severe systemic disease. After reviewing the risks                            and benefits, the patient was deemed in                            satisfactory condition to undergo the procedure.  After obtaining informed consent, the colonoscope                            was passed under direct vision. Throughout the                            procedure, the patient's blood pressure, pulse, and                            oxygen saturations were monitored continuously. The                            Olympus CF-HQ190L [id number]) Colonoscope was                             introduced through the anus and advanced to the the                            cecum, identified by appendiceal orifice and                            ileocecal valve. The colonoscopy was somewhat                            difficult due to significant looping. Successful                            completion of the procedure was aided by                            straightening and shortening the scope to obtain                            bowel loop reduction and applying abdominal                            pressure. The patient tolerated the procedure well.                            The quality of the bowel preparation was adequate.                            The bowel preparation used was Miralax via split                            dose instruction. The ileocecal valve, appendiceal                            orifice, and rectum were photographed. Scope In: 1:43:15 PM Scope Out: 2:19:24 PM Scope Withdrawal Time: 0 hours 22 minutes 3 seconds  Total Procedure Duration: 0 hours 36 minutes 9 seconds  Findings:                 The digital rectal exam findings include decreased  sphincter tone.                           A 10 mm polyp was found in the transverse colon.                            The polyp was pedunculated. The polyp was removed                            with a hot snare. Resection and retrieval were                            complete. Verification of patient identification                            for the specimen was done. Estimated blood loss:                            none.                           Four sessile polyps were found in the transverse                            colon and ascending colon. The polyps were 2 to 5                            mm in size. These polyps were removed with a cold                            snare. Resection and retrieval were complete.                           Multiple diverticula were found in the  sigmoid                            colon and descending colon.                           The sigmoid colon was moderately redundant.                           Internal hemorrhoids were found.                           Two small angiodysplastic lesions without bleeding                            were found in the cecum.                           The exam was otherwise without abnormality on                            direct and retroflexion views. Complications:  No immediate complications. Estimated blood loss:                            Minimal. Estimated Blood Loss:     Estimated blood loss was minimal. Impression:               - Decreased sphincter tone found on digital rectal                            exam.                           - One 10 mm polyp in the transverse colon, removed                            with a hot snare. Resected and retrieved.                           - Four 2 to 5 mm polyps in the transverse colon and                            in the ascending colon, removed with a cold snare.                            Resected and retrieved.                           - Diverticulosis in the sigmoid colon and in the                            descending colon.                           - Redundant colon.                           - Internal hemorrhoids.                           - Two non-bleeding colonic angiodysplastic lesions.                           - The examination was otherwise normal on direct                            and retroflexion views. Recommendation:           - Patient has a contact number available for                            emergencies. The signs and symptoms of potential                            delayed complications were discussed with the  patient. Return to normal activities tomorrow.                            Written discharge instructions were provided to the                            patient.                            - No aspirin, ibuprofen, naproxen, or other                            non-steroidal anti-inflammatory drugs for 2 weeks                            after polyp removal.                           - No recommendation at this time regarding repeat                            colonoscopy.                           - Await pathology results. Gatha Mayer, MD 06/25/2021 2:50:47 PM This report has been signed electronically.

## 2021-06-25 NOTE — Op Note (Addendum)
[facility] Patient Name: Justin Key Procedure Date: 06/25/2021 12:40 PM MRN: [id number] Endoscopist: Gatha Mayer , MD Age: 70 Referring MD:  Date of Birth: Jan 21, 1951 Gender: Male Account #: [id number] Procedure:                Upper GI endoscopy Indications:              Globus sensation Medicines:                Propofol per Anesthesia, Monitored Anesthesia Care Procedure:                Pre-Anesthesia Assessment:                           - Prior to the procedure, a History and Physical                            was performed, and patient medications and                            allergies were reviewed. The patient's tolerance of                            previous anesthesia was also reviewed. The risks                            and benefits of the procedure and the sedation                            options and risks were discussed with the patient.                            All questions were answered, and informed consent                            was obtained. Prior Anticoagulants: The patient has                            taken no previous anticoagulant or antiplatelet                            agents. ASA Grade Assessment: III - A patient with                            severe systemic disease. After reviewing the risks                            and benefits, the patient was deemed in                            satisfactory condition to undergo the procedure.                           After obtaining informed consent, the endoscope was  passed under direct vision. Throughout the                            procedure, the patient's blood pressure, pulse, and                            oxygen saturations were monitored continuously. The                            GIF HQ190 #[id number] was introduced through the                            mouth, and advanced to the second part of duodenum.                            The upper  GI endoscopy was accomplished without                            difficulty. The patient tolerated the procedure                            well. Scope In: Scope Out: Findings:                 The examined esophagus was moderately tortuous.                           The lumen of the esophagus was mildly dilated.                           The gastroesophageal flap valve was visualized                            endoscopically and classified as Hill Grade III                            (minimal fold, loose to endoscope, hiatal hernia                            likely).                           A 2 cm hiatal hernia was present.                           The exam was otherwise without abnormality.                           The cardia and gastric fundus were normal on                            retroflexion.                           The scope was withdrawn. Dilation was performed in  the entire esophagus with a Maloney dilator with                            moderate resistance at 54 Fr. Estimated blood loss:                            none. Reinspection w/o trauma Complications:            No immediate complications. Estimated Blood Loss:     Estimated blood loss: none. Impression:               - Tortuous esophagus.                           - Dilation in the entire esophagus.                           - Gastroesophageal flap valve classified as Hill                            Grade III (minimal fold, loose to endoscope, hiatal                            hernia likely).                           - 2 cm hiatal hernia.                           - The examination was otherwise normal.                           - Dilation performed in the entire esophagus. 71 Fr                            Maloney - no treauma seen on re-inspection                           - No specimens collected. Recommendation:           - Patient has a contact number available for                             emergencies. The signs and symptoms of potential                            delayed complications were discussed with the                            patient. Return to normal activities tomorrow.                            Written discharge instructions were provided to the                            patient.                           -  See the other procedure note for documentation of                            additional recommendations. Gatha Mayer, MD 06/25/2021 2:37:34 PM This report has been signed electronically.

## 2021-06-25 NOTE — Patient Instructions (Addendum)
I dilated the esophagus to see if that helps the sensation of something in the throat (globus) disappear.  Small hiatal hernia, esophagus a little dilated and tortuous - suggestive of decreased motility.  I found and removed 5 colon polyps, 4 tiny and one medium.  Also saw diverticulosis and something called AVM's (2). AVM's are surface blood vessels larger than normal - can sometimes bleed.  I will let you know pathology results and when to have another routine colonoscopy by mail and/or My Chart.  I appreciate the opportunity to care for you. Gatha Mayer, MD, [facility]  No aspirin, ibuprofen, naproxen, or other non-steriodal anti-inflammatory drugs (NSAIDs) for 2 weeks after polyp removal. You may use Tylenol if needed for mild pain or fever.    Handouts provided on polyps, diverticulosis, hemorrhoids and hiatal hernia.    YOU HAD AN ENDOSCOPIC PROCEDURE TODAY AT [facility]:   Refer to the procedure report that was given to you for any specific questions about what was found during the examination.  If the procedure report does not answer your questions, please call your gastroenterologist to clarify.  If you requested that your care partner not be given the details of your procedure findings, then the procedure report has been included in a sealed envelope for you to review at your convenience later.  YOU SHOULD EXPECT: Some feelings of bloating in the abdomen. Passage of more gas than usual.  Walking can help get rid of the air that was put into your GI tract during the procedure and reduce the bloating. If you had a lower endoscopy (such as a colonoscopy or flexible sigmoidoscopy) you may notice spotting of blood in your stool or on the toilet paper. If you underwent a bowel prep for your procedure, you may not have a normal bowel movement for a few days.  Please Note:  You might notice some irritation and congestion in your nose or some drainage.  This is from the oxygen  used during your procedure.  There is no need for concern and it should clear up in a day or so.  SYMPTOMS TO REPORT IMMEDIATELY:  Following lower endoscopy (colonoscopy or flexible sigmoidoscopy):  Excessive amounts of blood in the stool  Significant tenderness or worsening of abdominal pains  Swelling of the abdomen that is new, acute  Fever of 100F or higher  Following upper endoscopy (EGD)  Vomiting of blood or coffee ground material  New chest pain or pain under the shoulder blades  Painful or persistently difficult swallowing  New shortness of breath  Fever of 100F or higher  Black, tarry-looking stools  For urgent or emergent issues, a gastroenterologist can be reached at any hour by calling [phone]. Do not use MyChart messaging for urgent concerns.    DIET:  We do recommend a small meal at first, but then you may proceed to your regular diet.  Drink plenty of fluids but you should avoid alcoholic beverages for 24 hours.  ACTIVITY:  You should plan to take it easy for the rest of today and you should NOT DRIVE or use heavy machinery until tomorrow (because of the sedation medicines used during the test).    FOLLOW UP: Our staff will call the number listed on your records 48-72 hours following your procedure to check on you and address any questions or concerns that you may have regarding the information given to you following your procedure. If we do not reach you, we will leave  a message.  We will attempt to reach you two times.  During this call, we will ask if you have developed any symptoms of COVID 19. If you develop any symptoms (ie: fever, flu-like symptoms, shortness of breath, cough etc.) before then, please call [phone].  If you test positive for Covid 19 in the 2 weeks post procedure, please call and report this information to [place].    If any biopsies were taken you will be contacted by phone or by letter within the next 1-3 weeks.  Please call us at [phone] if you have not heard about the biopsies in 3 weeks.    SIGNATURES/CONFIDENTIALITY: You and/or your care partner have signed paperwork which will be entered into your electronic medical record.  These signatures attest to the fact that that the information above on your After Visit Summary has been reviewed and is understood.  Full responsibility of the confidentiality of this discharge information lies with you and/or your care-partner.

## 2021-06-25 NOTE — Progress Notes (Signed)
VS by DT  Pt's states no medical or surgical changes since previsit or office visit.  
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
## 2021-06-27 ENCOUNTER — Telehealth: Payer: Self-pay | Admitting: *Deleted

## 2021-06-27 ENCOUNTER — Telehealth: Payer: Self-pay

## 2021-06-27 NOTE — Telephone Encounter (Signed)
  Follow up Call-  Call back number 06/25/2021  Post procedure Call Back phone  # [phone]  Permission to leave phone message Yes  Some recent data might be hidden     Patient questions:  Do you have a fever, pain , or abdominal swelling? No. Pain Score  0 *  Have you tolerated food without any problems? Yes.    Have you been able to return to your normal activities? Yes.    Do you have any questions about your discharge instructions: Diet   No. Medications  No. Follow up visit  No.  Do you have questions or concerns about your Care? No.  Actions: * If pain score is 4 or above: No action needed, pain <4.  Have you developed a fever since your procedure? no  2.   Have you had an respiratory symptoms (SOB or cough) since your procedure? no  3.   Have you tested positive for COVID 19 since your procedure no  4.   Have you had any family members/close contacts diagnosed with the COVID 19 since your procedure?  no   If yes to any of these questions please route to Joylene John, RN and Joella Prince, RN

## 2021-06-27 NOTE — Telephone Encounter (Signed)
  Follow up Call-  Call back number 06/25/2021  Post procedure Call Back phone  # [phone]  Permission to leave phone message Yes  Some recent data might be hidden    First attempt for follow up phone call. No answer at number given.  Left message on voicemail.

## 2021-06-30 ENCOUNTER — Encounter: Payer: Self-pay | Admitting: Internal Medicine

## 2021-07-08 ENCOUNTER — Other Ambulatory Visit (HOSPITAL_COMMUNITY): Payer: Self-pay

## 2021-07-08 MED FILL — Glucose Blood Test Strip: 90 days supply | Qty: 200 | Fill #0 | Status: AC

## 2021-08-04 ENCOUNTER — Other Ambulatory Visit (HOSPITAL_COMMUNITY): Payer: Self-pay

## 2021-08-26 ENCOUNTER — Encounter: Payer: Self-pay | Admitting: Interventional Cardiology

## 2021-08-30 ENCOUNTER — Other Ambulatory Visit (HOSPITAL_COMMUNITY): Payer: Self-pay

## 2021-09-02 ENCOUNTER — Other Ambulatory Visit (HOSPITAL_COMMUNITY): Payer: Self-pay

## 2021-09-02 MED FILL — Glimepiride Tab 4 MG: ORAL | 90 days supply | Qty: 180 | Fill #2 | Status: AC

## 2021-09-03 ENCOUNTER — Other Ambulatory Visit (HOSPITAL_COMMUNITY): Payer: Self-pay

## 2021-09-04 ENCOUNTER — Other Ambulatory Visit (HOSPITAL_COMMUNITY): Payer: Self-pay

## 2021-09-10 ENCOUNTER — Other Ambulatory Visit (HOSPITAL_COMMUNITY): Payer: Self-pay

## 2021-09-10 DIAGNOSIS — E7800 Pure hypercholesterolemia, unspecified: Secondary | ICD-10-CM | POA: Diagnosis not present

## 2021-09-10 DIAGNOSIS — F332 Major depressive disorder, recurrent severe without psychotic features: Secondary | ICD-10-CM | POA: Diagnosis not present

## 2021-09-10 DIAGNOSIS — R296 Repeated falls: Secondary | ICD-10-CM | POA: Diagnosis not present

## 2021-09-10 DIAGNOSIS — E559 Vitamin D deficiency, unspecified: Secondary | ICD-10-CM | POA: Diagnosis not present

## 2021-09-10 DIAGNOSIS — F5101 Primary insomnia: Secondary | ICD-10-CM | POA: Diagnosis not present

## 2021-09-10 DIAGNOSIS — E1169 Type 2 diabetes mellitus with other specified complication: Secondary | ICD-10-CM | POA: Diagnosis not present

## 2021-09-10 DIAGNOSIS — R531 Weakness: Secondary | ICD-10-CM | POA: Diagnosis not present

## 2021-09-10 MED ORDER — SERTRALINE HCL 25 MG PO TABS
25.0000 mg | ORAL_TABLET | Freq: Every day | ORAL | 0 refills | Status: DC
  Filled 2021-09-10: qty 90, 90d supply, fill #0

## 2021-09-11 ENCOUNTER — Other Ambulatory Visit (HOSPITAL_COMMUNITY): Payer: Self-pay

## 2021-09-16 ENCOUNTER — Other Ambulatory Visit (HOSPITAL_COMMUNITY): Payer: Self-pay

## 2021-09-17 ENCOUNTER — Other Ambulatory Visit (HOSPITAL_COMMUNITY): Payer: Self-pay

## 2021-09-17 ENCOUNTER — Other Ambulatory Visit: Payer: Self-pay | Admitting: Interventional Cardiology

## 2021-09-17 MED ORDER — METOPROLOL SUCCINATE ER 25 MG PO TB24
25.0000 mg | ORAL_TABLET | Freq: Every day | ORAL | 3 refills | Status: DC
  Filled 2021-09-17: qty 90, 90d supply, fill #0
  Filled 2021-12-31: qty 30, 30d supply, fill #1
  Filled 2021-12-31: qty 90, 90d supply, fill #1

## 2021-09-19 ENCOUNTER — Other Ambulatory Visit (HOSPITAL_COMMUNITY): Payer: Self-pay

## 2021-09-23 DIAGNOSIS — F339 Major depressive disorder, recurrent, unspecified: Secondary | ICD-10-CM | POA: Diagnosis not present

## 2021-09-29 ENCOUNTER — Other Ambulatory Visit (HOSPITAL_COMMUNITY): Payer: Self-pay

## 2021-10-03 DIAGNOSIS — F339 Major depressive disorder, recurrent, unspecified: Secondary | ICD-10-CM | POA: Diagnosis not present

## 2021-10-09 DIAGNOSIS — F339 Major depressive disorder, recurrent, unspecified: Secondary | ICD-10-CM | POA: Diagnosis not present

## 2021-10-13 ENCOUNTER — Other Ambulatory Visit (HOSPITAL_COMMUNITY): Payer: Self-pay

## 2021-10-23 ENCOUNTER — Ambulatory Visit: Payer: 59 | Admitting: Interventional Cardiology

## 2021-11-03 ENCOUNTER — Other Ambulatory Visit (HOSPITAL_COMMUNITY): Payer: Self-pay

## 2021-11-04 ENCOUNTER — Other Ambulatory Visit (HOSPITAL_COMMUNITY): Payer: Self-pay

## 2021-11-04 MED ORDER — FREESTYLE LITE TEST VI STRP
ORAL_STRIP | 4 refills | Status: AC
  Filled 2021-11-04: qty 200, 90d supply, fill #0

## 2021-11-05 DIAGNOSIS — H25813 Combined forms of age-related cataract, bilateral: Secondary | ICD-10-CM | POA: Diagnosis not present

## 2021-11-13 ENCOUNTER — Other Ambulatory Visit (HOSPITAL_COMMUNITY): Payer: Self-pay

## 2021-11-19 ENCOUNTER — Other Ambulatory Visit (HOSPITAL_BASED_OUTPATIENT_CLINIC_OR_DEPARTMENT_OTHER): Payer: Self-pay

## 2021-11-19 ENCOUNTER — Other Ambulatory Visit (HOSPITAL_COMMUNITY): Payer: Self-pay

## 2021-11-19 MED ORDER — METOCLOPRAMIDE HCL 5 MG PO TABS
ORAL_TABLET | ORAL | 0 refills | Status: DC
  Filled 2021-11-19: qty 60, 30d supply, fill #0

## 2021-11-24 ENCOUNTER — Other Ambulatory Visit (HOSPITAL_COMMUNITY): Payer: Self-pay

## 2021-11-24 MED ORDER — GLIMEPIRIDE 4 MG PO TABS
8.0000 mg | ORAL_TABLET | Freq: Every day | ORAL | 1 refills | Status: DC
  Filled 2021-11-24 (×2): qty 180, 90d supply, fill #0
  Filled 2022-03-10: qty 180, 90d supply, fill #1

## 2021-11-25 ENCOUNTER — Other Ambulatory Visit (HOSPITAL_COMMUNITY): Payer: Self-pay

## 2021-12-09 ENCOUNTER — Other Ambulatory Visit (HOSPITAL_COMMUNITY): Payer: Self-pay

## 2021-12-17 ENCOUNTER — Other Ambulatory Visit (HOSPITAL_COMMUNITY): Payer: Self-pay
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
## 2021-12-24 ENCOUNTER — Encounter: Payer: Self-pay | Admitting: Interventional Cardiology

## 2021-12-24 ENCOUNTER — Ambulatory Visit (INDEPENDENT_AMBULATORY_CARE_PROVIDER_SITE_OTHER): Payer: HMO | Admitting: Interventional Cardiology

## 2021-12-24 VITALS — BP 138/64 | HR 65 | Ht 72.0 in | Wt 187.2 lb

## 2021-12-24 DIAGNOSIS — E785 Hyperlipidemia, unspecified: Secondary | ICD-10-CM

## 2021-12-24 DIAGNOSIS — R0989 Other specified symptoms and signs involving the circulatory and respiratory systems: Secondary | ICD-10-CM

## 2021-12-24 DIAGNOSIS — Z789 Other specified health status: Secondary | ICD-10-CM | POA: Diagnosis not present

## 2021-12-24 DIAGNOSIS — I25118 Atherosclerotic heart disease of native coronary artery with other forms of angina pectoris: Secondary | ICD-10-CM

## 2021-12-24 DIAGNOSIS — E1159 Type 2 diabetes mellitus with other circulatory complications: Secondary | ICD-10-CM | POA: Diagnosis not present

## 2021-12-24 DIAGNOSIS — K222 Esophageal obstruction: Secondary | ICD-10-CM

## 2021-12-24 NOTE — Patient Instructions (Signed)
Medication Instructions:  ?Your physician recommends that you continue on your current medications as directed. Please refer to the Current Medication list given to you today. ? ?*If you need a refill on your cardiac medications before your next appointment, please call your pharmacy* ? ? ?Lab Work: ?None ?If you have labs (blood work) drawn today and your tests are completely normal, you will receive your results only by: ?MyChart Message (if you have MyChart) OR ?A paper copy in the mail ?If you have any lab test that is abnormal or we need to change your treatment, we will call you to review the results. ? ? ?Testing/Procedures: ?Your physician has requested that you have a lower or upper extremity arterial duplex. This test is an ultrasound of the arteries in the legs or arms. It looks at arterial blood flow in the legs and arms. Allow one hour for Lower and Upper Arterial scans. There are no restrictions or special instructions ? ? ?Follow-Up: ?At [facility], you and your health needs are our priority.  As part of our continuing mission to provide you with exceptional heart care, we have created designated Provider Care Teams.  These Care Teams include your primary Cardiologist (physician) and Advanced Practice Providers (APPs -  Physician Assistants and Nurse Practitioners) who all work together to provide you with the care you need, when you need it. ? ?We recommend signing up for the patient portal called "MyChart".  Sign up information is provided on this After Visit Summary.  MyChart is used to connect with patients for Virtual Visits (Telemedicine).  Patients are able to view lab/test results, encounter notes, upcoming appointments, etc.  Non-urgent messages can be sent to your provider as well.   ?To learn more about what you can do with MyChart, go to [email or web address].   ? ?Your next appointment:   ?1 year(s) ? ?The format for your next appointment:   ?In Person ? ?Provider:   ?Sinclair Grooms, MD  ? ? ?Other Instructions ?  ?

## 2021-12-26 NOTE — Addendum Note (Signed)
Addended by: Ma Hillock on: 12/26/2021 02:39 PM ? ? Modules accepted: Orders ? ?

## 2021-12-29 ENCOUNTER — Other Ambulatory Visit (HOSPITAL_COMMUNITY): Payer: Self-pay

## 2021-12-30 ENCOUNTER — Other Ambulatory Visit (HOSPITAL_COMMUNITY): Payer: Self-pay

## 2021-12-30 MED ORDER — SODIUM FLUORIDE 1.1 % DT CREA
TOPICAL_CREAM | DENTAL | 2 refills | Status: DC
  Filled 2021-12-30 – 2022-01-27 (×2): qty 51, 30d supply, fill #0

## 2021-12-31 ENCOUNTER — Other Ambulatory Visit (HOSPITAL_COMMUNITY): Payer: Self-pay

## 2022-01-02 ENCOUNTER — Other Ambulatory Visit: Payer: Self-pay | Admitting: Interventional Cardiology

## 2022-01-02 DIAGNOSIS — R0989 Other specified symptoms and signs involving the circulatory and respiratory systems: Secondary | ICD-10-CM

## 2022-01-07 ENCOUNTER — Other Ambulatory Visit (HOSPITAL_COMMUNITY): Payer: Self-pay

## 2022-01-12 ENCOUNTER — Ambulatory Visit (HOSPITAL_COMMUNITY)
Admission: RE | Admit: 2022-01-12 | Discharge: 2022-01-12 | Disposition: A | Discharge status: Home / Self Care | Payer: PPO | Source: Ambulatory Visit | Attending: Cardiology | Admitting: Cardiology

## 2022-01-12 DIAGNOSIS — R0989 Other specified symptoms and signs involving the circulatory and respiratory systems: Secondary | ICD-10-CM | POA: Diagnosis not present

## 2022-01-27 ENCOUNTER — Other Ambulatory Visit (HOSPITAL_COMMUNITY): Payer: Self-pay

## 2022-01-28 DIAGNOSIS — H25811 Combined forms of age-related cataract, right eye: Secondary | ICD-10-CM | POA: Diagnosis not present

## 2022-02-03 ENCOUNTER — Other Ambulatory Visit (HOSPITAL_COMMUNITY): Payer: Self-pay

## 2022-02-03 MED ORDER — OFLOXACIN 0.3 % OP SOLN
OPHTHALMIC | 0 refills | Status: DC
Start: 2022-01-28 — End: 2022-02-09
  Filled 2022-02-03: qty 5, 7d supply, fill #0

## 2022-02-03 MED ORDER — PREDNISOLONE ACETATE 1 % OP SUSP
OPHTHALMIC | 2 refills | Status: DC
Start: 2022-01-28 — End: 2022-04-29
  Filled 2022-02-03: qty 5, 7d supply, fill #0

## 2022-02-08 ENCOUNTER — Other Ambulatory Visit (HOSPITAL_COMMUNITY): Payer: Self-pay

## 2022-02-09 ENCOUNTER — Other Ambulatory Visit (HOSPITAL_COMMUNITY): Payer: Self-pay

## 2022-02-09 DIAGNOSIS — H25811 Combined forms of age-related cataract, right eye: Secondary | ICD-10-CM | POA: Diagnosis not present

## 2022-02-09 DIAGNOSIS — H52221 Regular astigmatism, right eye: Secondary | ICD-10-CM | POA: Diagnosis not present

## 2022-02-09 MED ORDER — OFLOXACIN 0.3 % OP SOLN
OPHTHALMIC | 1 refills | Status: DC
  Filled 2022-02-09: qty 5, 7d supply, fill #0

## 2022-02-09 MED ORDER — SERTRALINE HCL 25 MG PO TABS
25.0000 mg | ORAL_TABLET | Freq: Every day | ORAL | 0 refills | Status: DC
  Filled 2022-02-09: qty 90, 90d supply, fill #0

## 2022-02-09 MED ORDER — PREDNISOLONE ACETATE 1 % OP SUSP
OPHTHALMIC | 2 refills | Status: DC
  Filled 2022-02-09: qty 5, 30d supply, fill #0

## 2022-02-10 ENCOUNTER — Other Ambulatory Visit (HOSPITAL_COMMUNITY): Payer: Self-pay

## 2022-02-13 ENCOUNTER — Other Ambulatory Visit (HOSPITAL_COMMUNITY): Payer: Self-pay

## 2022-02-17 ENCOUNTER — Other Ambulatory Visit (HOSPITAL_COMMUNITY): Payer: Self-pay

## 2022-02-17 MED ORDER — METOCLOPRAMIDE HCL 5 MG PO TABS
ORAL_TABLET | ORAL | 0 refills | Status: DC
  Filled 2022-02-17: qty 60, 30d supply, fill #0

## 2022-02-18 ENCOUNTER — Other Ambulatory Visit (HOSPITAL_COMMUNITY): Payer: Self-pay

## 2022-02-20 ENCOUNTER — Other Ambulatory Visit: Payer: Self-pay | Admitting: Interventional Cardiology

## 2022-02-20 ENCOUNTER — Other Ambulatory Visit (HOSPITAL_COMMUNITY): Payer: Self-pay

## 2022-02-20 MED ORDER — METOPROLOL SUCCINATE ER 25 MG PO TB24
25.0000 mg | ORAL_TABLET | Freq: Every day | ORAL | 3 refills | Status: DC
  Filled 2022-02-20: qty 90, 90d supply, fill #0
  Filled 2022-06-14: qty 90, 90d supply, fill #1
  Filled 2022-10-15 (×2): qty 90, 90d supply, fill #2

## 2022-02-20 MED ORDER — METFORMIN HCL ER 500 MG PO TB24
500.0000 mg | ORAL_TABLET | Freq: Two times a day (BID) | ORAL | 6 refills | Status: DC
  Filled 2022-02-20: qty 60, 30d supply, fill #0
  Filled 2022-04-19: qty 60, 30d supply, fill #1
  Filled 2022-06-08: qty 60, 30d supply, fill #2
  Filled 2022-07-22: qty 60, 30d supply, fill #3
  Filled 2022-08-27: qty 60, 30d supply, fill #4
  Filled 2022-10-03: qty 60, 30d supply, fill #5
  Filled 2022-11-03: qty 60, 30d supply, fill #6

## 2022-02-25 ENCOUNTER — Other Ambulatory Visit (HOSPITAL_COMMUNITY): Payer: Self-pay

## 2022-02-25 MED ORDER — OFLOXACIN 0.3 % OP SOLN
OPHTHALMIC | 1 refills | Status: DC
  Filled 2022-02-25: qty 5, 7d supply, fill #0

## 2022-02-25 MED ORDER — PREDNISOLONE ACETATE 1 % OP SUSP
OPHTHALMIC | 2 refills | Status: DC
  Filled 2022-02-25: qty 5, 28d supply, fill #0

## 2022-03-02 ENCOUNTER — Other Ambulatory Visit (HOSPITAL_COMMUNITY): Payer: Self-pay

## 2022-03-05 ENCOUNTER — Other Ambulatory Visit (HOSPITAL_COMMUNITY): Payer: Self-pay

## 2022-03-06 ENCOUNTER — Other Ambulatory Visit (HOSPITAL_COMMUNITY): Payer: Self-pay

## 2022-03-06 MED ORDER — TRESIBA FLEXTOUCH 100 UNIT/ML ~~LOC~~ SOPN
PEN_INJECTOR | SUBCUTANEOUS | 0 refills | Status: DC
  Filled 2022-03-06: qty 3, 37d supply, fill #0
  Filled 2022-05-05: qty 3, 28d supply, fill #0

## 2022-03-09 ENCOUNTER — Other Ambulatory Visit (HOSPITAL_COMMUNITY): Payer: Self-pay

## 2022-03-09 DIAGNOSIS — E7800 Pure hypercholesterolemia, unspecified: Secondary | ICD-10-CM | POA: Diagnosis not present

## 2022-03-09 DIAGNOSIS — I10 Essential (primary) hypertension: Secondary | ICD-10-CM | POA: Diagnosis not present

## 2022-03-09 DIAGNOSIS — E1165 Type 2 diabetes mellitus with hyperglycemia: Secondary | ICD-10-CM | POA: Diagnosis not present

## 2022-03-09 MED ORDER — BD PEN NEEDLE NANO 2ND GEN 32G X 4 MM MISC
6 refills | Status: DC
  Filled 2022-03-09: qty 100, 30d supply, fill #0

## 2022-03-10 ENCOUNTER — Other Ambulatory Visit (HOSPITAL_COMMUNITY): Payer: Self-pay

## 2022-03-13 DIAGNOSIS — H25812 Combined forms of age-related cataract, left eye: Secondary | ICD-10-CM | POA: Diagnosis not present

## 2022-03-13 DIAGNOSIS — H2512 Age-related nuclear cataract, left eye: Secondary | ICD-10-CM | POA: Diagnosis not present

## 2022-03-13 DIAGNOSIS — H269 Unspecified cataract: Secondary | ICD-10-CM | POA: Diagnosis not present

## 2022-03-16 ENCOUNTER — Other Ambulatory Visit (HOSPITAL_COMMUNITY): Payer: Self-pay

## 2022-03-16 MED ORDER — CLINDAMYCIN HCL 150 MG PO CAPS
ORAL_CAPSULE | ORAL | 0 refills | Status: DC
  Filled 2022-03-16: qty 4, 1d supply, fill #0

## 2022-03-17 ENCOUNTER — Other Ambulatory Visit (HOSPITAL_COMMUNITY): Payer: Self-pay

## 2022-03-17 MED ORDER — AMOXICILLIN 500 MG PO CAPS
ORAL_CAPSULE | ORAL | 0 refills | Status: DC
  Filled 2022-03-17: qty 15, 5d supply, fill #0

## 2022-03-17 MED ORDER — HYDROCODONE-ACETAMINOPHEN 5-325 MG PO TABS
ORAL_TABLET | ORAL | 0 refills | Status: DC
  Filled 2022-03-17: qty 10, 3d supply, fill #0

## 2022-03-18 ENCOUNTER — Other Ambulatory Visit (HOSPITAL_COMMUNITY): Payer: Self-pay

## 2022-03-26 ENCOUNTER — Other Ambulatory Visit (HOSPITAL_COMMUNITY): Payer: Self-pay

## 2022-04-07 ENCOUNTER — Other Ambulatory Visit (HOSPITAL_COMMUNITY): Payer: Self-pay

## 2022-04-08 ENCOUNTER — Other Ambulatory Visit (HOSPITAL_COMMUNITY): Payer: Self-pay

## 2022-04-08 DIAGNOSIS — N5201 Erectile dysfunction due to arterial insufficiency: Secondary | ICD-10-CM | POA: Diagnosis not present

## 2022-04-08 DIAGNOSIS — N3941 Urge incontinence: Secondary | ICD-10-CM | POA: Diagnosis not present

## 2022-04-08 DIAGNOSIS — N50811 Right testicular pain: Secondary | ICD-10-CM | POA: Diagnosis not present

## 2022-04-08 MED ORDER — METOCLOPRAMIDE HCL 5 MG PO TABS
ORAL_TABLET | ORAL | 0 refills | Status: DC
  Filled 2022-04-08: qty 60, 30d supply, fill #0

## 2022-04-20 ENCOUNTER — Other Ambulatory Visit (HOSPITAL_COMMUNITY): Payer: Self-pay

## 2022-04-28 ENCOUNTER — Encounter (HOSPITAL_COMMUNITY): Payer: Self-pay

## 2022-04-28 ENCOUNTER — Other Ambulatory Visit: Payer: Self-pay

## 2022-04-28 ENCOUNTER — Emergency Department (HOSPITAL_COMMUNITY): Payer: PPO

## 2022-04-28 ENCOUNTER — Ambulatory Visit: Payer: HMO | Admitting: Podiatry

## 2022-04-28 ENCOUNTER — Observation Stay (HOSPITAL_COMMUNITY)
Admission: EM | Admit: 2022-04-28 | Discharge: 2022-04-29 | Disposition: A | Discharge status: Home / Self Care | Payer: PPO | Attending: Emergency Medicine | Admitting: Emergency Medicine

## 2022-04-28 DIAGNOSIS — Z7984 Long term (current) use of oral hypoglycemic drugs: Secondary | ICD-10-CM | POA: Diagnosis not present

## 2022-04-28 DIAGNOSIS — R079 Chest pain, unspecified: Secondary | ICD-10-CM | POA: Diagnosis not present

## 2022-04-28 DIAGNOSIS — R1013 Epigastric pain: Secondary | ICD-10-CM | POA: Diagnosis not present

## 2022-04-28 DIAGNOSIS — E1143 Type 2 diabetes mellitus with diabetic autonomic (poly)neuropathy: Secondary | ICD-10-CM | POA: Diagnosis present

## 2022-04-28 DIAGNOSIS — R1010 Upper abdominal pain, unspecified: Secondary | ICD-10-CM | POA: Insufficient documentation

## 2022-04-28 DIAGNOSIS — Z79899 Other long term (current) drug therapy: Secondary | ICD-10-CM | POA: Insufficient documentation

## 2022-04-28 DIAGNOSIS — Z8719 Personal history of other diseases of the digestive system: Secondary | ICD-10-CM | POA: Insufficient documentation

## 2022-04-28 DIAGNOSIS — I739 Peripheral vascular disease, unspecified: Secondary | ICD-10-CM | POA: Diagnosis not present

## 2022-04-28 DIAGNOSIS — I152 Hypertension secondary to endocrine disorders: Secondary | ICD-10-CM | POA: Diagnosis present

## 2022-04-28 DIAGNOSIS — R1084 Generalized abdominal pain: Secondary | ICD-10-CM

## 2022-04-28 DIAGNOSIS — K5730 Diverticulosis of large intestine without perforation or abscess without bleeding: Secondary | ICD-10-CM | POA: Insufficient documentation

## 2022-04-28 DIAGNOSIS — E1169 Type 2 diabetes mellitus with other specified complication: Secondary | ICD-10-CM | POA: Insufficient documentation

## 2022-04-28 DIAGNOSIS — X58XXXA Exposure to other specified factors, initial encounter: Secondary | ICD-10-CM | POA: Insufficient documentation

## 2022-04-28 DIAGNOSIS — M5134 Other intervertebral disc degeneration, thoracic region: Secondary | ICD-10-CM | POA: Insufficient documentation

## 2022-04-28 DIAGNOSIS — I119 Hypertensive heart disease without heart failure: Secondary | ICD-10-CM | POA: Diagnosis not present

## 2022-04-28 DIAGNOSIS — K2270 Barrett's esophagus without dysplasia: Secondary | ICD-10-CM | POA: Insufficient documentation

## 2022-04-28 DIAGNOSIS — Z87891 Personal history of nicotine dependence: Secondary | ICD-10-CM | POA: Diagnosis not present

## 2022-04-28 DIAGNOSIS — I700 Atherosclerosis of aorta: Secondary | ICD-10-CM | POA: Insufficient documentation

## 2022-04-28 DIAGNOSIS — Z7982 Long term (current) use of aspirin: Secondary | ICD-10-CM | POA: Insufficient documentation

## 2022-04-28 DIAGNOSIS — R0789 Other chest pain: Secondary | ICD-10-CM | POA: Diagnosis not present

## 2022-04-28 DIAGNOSIS — I10 Essential (primary) hypertension: Secondary | ICD-10-CM | POA: Diagnosis not present

## 2022-04-28 DIAGNOSIS — Z981 Arthrodesis status: Secondary | ICD-10-CM | POA: Insufficient documentation

## 2022-04-28 DIAGNOSIS — E785 Hyperlipidemia, unspecified: Secondary | ICD-10-CM | POA: Diagnosis not present

## 2022-04-28 DIAGNOSIS — R739 Hyperglycemia, unspecified: Secondary | ICD-10-CM | POA: Diagnosis not present

## 2022-04-28 DIAGNOSIS — G8929 Other chronic pain: Secondary | ICD-10-CM | POA: Diagnosis not present

## 2022-04-28 DIAGNOSIS — N289 Disorder of kidney and ureter, unspecified: Secondary | ICD-10-CM

## 2022-04-28 DIAGNOSIS — I491 Atrial premature depolarization: Secondary | ICD-10-CM | POA: Insufficient documentation

## 2022-04-28 DIAGNOSIS — K219 Gastro-esophageal reflux disease without esophagitis: Secondary | ICD-10-CM | POA: Diagnosis not present

## 2022-04-28 DIAGNOSIS — Z794 Long term (current) use of insulin: Secondary | ICD-10-CM | POA: Insufficient documentation

## 2022-04-28 DIAGNOSIS — E1159 Type 2 diabetes mellitus with other circulatory complications: Secondary | ICD-10-CM | POA: Diagnosis present

## 2022-04-28 DIAGNOSIS — R110 Nausea: Secondary | ICD-10-CM | POA: Diagnosis not present

## 2022-04-28 DIAGNOSIS — I493 Ventricular premature depolarization: Secondary | ICD-10-CM | POA: Diagnosis not present

## 2022-04-28 DIAGNOSIS — I708 Atherosclerosis of other arteries: Secondary | ICD-10-CM | POA: Diagnosis not present

## 2022-04-28 DIAGNOSIS — M549 Dorsalgia, unspecified: Secondary | ICD-10-CM | POA: Diagnosis not present

## 2022-04-28 DIAGNOSIS — K3184 Gastroparesis: Secondary | ICD-10-CM | POA: Diagnosis not present

## 2022-04-28 DIAGNOSIS — E1165 Type 2 diabetes mellitus with hyperglycemia: Secondary | ICD-10-CM | POA: Insufficient documentation

## 2022-04-28 DIAGNOSIS — E278 Other specified disorders of adrenal gland: Secondary | ICD-10-CM | POA: Insufficient documentation

## 2022-04-28 DIAGNOSIS — F1290 Cannabis use, unspecified, uncomplicated: Secondary | ICD-10-CM | POA: Insufficient documentation

## 2022-04-28 DIAGNOSIS — E119 Type 2 diabetes mellitus without complications: Secondary | ICD-10-CM

## 2022-04-28 DIAGNOSIS — R778 Other specified abnormalities of plasma proteins: Secondary | ICD-10-CM | POA: Diagnosis not present

## 2022-04-28 DIAGNOSIS — E1151 Type 2 diabetes mellitus with diabetic peripheral angiopathy without gangrene: Secondary | ICD-10-CM | POA: Diagnosis not present

## 2022-04-28 DIAGNOSIS — I252 Old myocardial infarction: Secondary | ICD-10-CM | POA: Insufficient documentation

## 2022-04-28 DIAGNOSIS — S90422A Blister (nonthermal), left great toe, initial encounter: Secondary | ICD-10-CM | POA: Insufficient documentation

## 2022-04-28 DIAGNOSIS — I2510 Atherosclerotic heart disease of native coronary artery without angina pectoris: Secondary | ICD-10-CM | POA: Diagnosis present

## 2022-04-28 DIAGNOSIS — I2583 Coronary atherosclerosis due to lipid rich plaque: Secondary | ICD-10-CM | POA: Diagnosis not present

## 2022-04-28 LAB — BASIC METABOLIC PANEL
Anion gap: 12 (ref 5–15)
BUN: 16 mg/dL (ref 8–23)
CO2: 21 mmol/L — ABNORMAL LOW (ref 22–32)
Calcium: 9.5 mg/dL (ref 8.9–10.3)
Chloride: 105 mmol/L (ref 98–111)
Creatinine, Ser: 1.33 mg/dL — ABNORMAL HIGH (ref 0.61–1.24)
GFR, Estimated: 58 mL/min — ABNORMAL LOW (ref >60)
Glucose, Bld: 243 mg/dL — ABNORMAL HIGH (ref 70–99)
Potassium: 4.1 mmol/L (ref 3.5–5.1)
Sodium: 138 mmol/L (ref 135–145)

## 2022-04-28 LAB — HEPATIC FUNCTION PANEL
ALT: 14 U/L (ref 0–44)
AST: 20 U/L (ref 15–41)
Albumin: 3.4 g/dL — ABNORMAL LOW (ref 3.5–5.0)
Alkaline Phosphatase: 75 U/L (ref 38–126)
Bilirubin, Direct: 0.2 mg/dL (ref 0.0–0.2)
Indirect Bilirubin: 0.6 mg/dL (ref 0.3–0.9)
Total Bilirubin: 0.8 mg/dL (ref 0.3–1.2)
Total Protein: 7.3 g/dL (ref 6.5–8.1)

## 2022-04-28 LAB — CBC
HCT: 45.1 % (ref 39.0–52.0)
Hemoglobin: 14.9 g/dL (ref 13.0–17.0)
MCH: 28.7 pg (ref 26.0–34.0)
MCHC: 33 g/dL (ref 30.0–36.0)
MCV: 86.7 fL (ref 80.0–100.0)
Platelets: 236 10*3/uL (ref 150–400)
RBC: 5.2 MIL/uL (ref 4.22–5.81)
RDW: 14 % (ref 11.5–15.5)
WBC: 11.2 10*3/uL — ABNORMAL HIGH (ref 4.0–10.5)
nRBC: 0 % (ref 0.0–0.2)

## 2022-04-28 LAB — CBG MONITORING, ED: Glucose-Capillary: 179 mg/dL — ABNORMAL HIGH (ref 70–99)

## 2022-04-28 LAB — PROTIME-INR
INR: 1 (ref 0.8–1.2)
Prothrombin Time: 13.1 seconds (ref 11.4–15.2)

## 2022-04-28 LAB — TROPONIN I (HIGH SENSITIVITY)
Troponin I (High Sensitivity): 29 ng/L — ABNORMAL HIGH (ref <18)
Troponin I (High Sensitivity): 46 ng/L — ABNORMAL HIGH (ref <18)
Troponin I (High Sensitivity): 61 ng/L — ABNORMAL HIGH (ref <18)

## 2022-04-28 LAB — LIPASE, BLOOD: Lipase: 24 U/L (ref 11–51)

## 2022-04-28 LAB — HEMOGLOBIN A1C
Hgb A1c MFr Bld: 7.8 % — ABNORMAL HIGH (ref 4.8–5.6)
Mean Plasma Glucose: 177.16 mg/dL

## 2022-04-28 MED ORDER — SODIUM CHLORIDE 0.9 % IV SOLN: INTRAVENOUS | Status: DC

## 2022-04-28 MED ORDER — SODIUM CHLORIDE 0.9% FLUSH
3.0000 mL | Freq: Two times a day (BID) | INTRAVENOUS | Status: DC
  Administered 2022-04-28: 3 mL via INTRAVENOUS

## 2022-04-28 MED ORDER — INSULIN ASPART 100 UNIT/ML IJ SOLN: 0.0000 [IU] | Freq: Every day | INTRAMUSCULAR | Status: DC

## 2022-04-28 MED ORDER — MIRTAZAPINE 15 MG PO TABS
15.0000 mg | ORAL_TABLET | Freq: Every day | ORAL | Status: DC
  Administered 2022-04-28: 15 mg via ORAL
  Filled 2022-04-28 (×2): qty 1

## 2022-04-28 MED ORDER — LABETALOL HCL 5 MG/ML IV SOLN
10.0000 mg | Freq: Once | INTRAVENOUS | Status: AC
  Administered 2022-04-28: 10 mg via INTRAVENOUS
  Filled 2022-04-28: qty 4

## 2022-04-28 MED ORDER — METOPROLOL SUCCINATE ER 25 MG PO TB24
25.0000 mg | ORAL_TABLET | Freq: Every day | ORAL | Status: DC
  Administered 2022-04-29: 25 mg via ORAL
  Filled 2022-04-28: qty 1

## 2022-04-28 MED ORDER — INSULIN DEGLUDEC 100 UNIT/ML ~~LOC~~ SOPN: 8.0000 [IU] | PEN_INJECTOR | Freq: Every day | SUBCUTANEOUS | Status: DC

## 2022-04-28 MED ORDER — ACETAMINOPHEN 325 MG PO TABS
650.0000 mg | ORAL_TABLET | Freq: Four times a day (QID) | ORAL | Status: DC | PRN
  Administered 2022-04-28: 650 mg via ORAL
  Filled 2022-04-28: qty 2

## 2022-04-28 MED ORDER — LIDOCAINE 5 % EX PTCH: 1.0000 | MEDICATED_PATCH | Freq: Two times a day (BID) | CUTANEOUS | Status: DC | PRN

## 2022-04-28 MED ORDER — ONDANSETRON HCL 4 MG/2ML IJ SOLN
4.0000 mg | Freq: Once | INTRAMUSCULAR | Status: AC
  Administered 2022-04-28: 4 mg via INTRAVENOUS
  Filled 2022-04-28: qty 2

## 2022-04-28 MED ORDER — NITROGLYCERIN 0.4 MG SL SUBL: 0.4000 mg | SUBLINGUAL_TABLET | SUBLINGUAL | Status: DC | PRN

## 2022-04-28 MED ORDER — IOHEXOL 350 MG/ML SOLN
100.0000 mL | Freq: Once | INTRAVENOUS | Status: AC | PRN
  Administered 2022-04-28: 100 mL via INTRAVENOUS

## 2022-04-28 MED ORDER — ASPIRIN 81 MG PO TBEC
81.0000 mg | DELAYED_RELEASE_TABLET | Freq: Every day | ORAL | Status: DC
  Administered 2022-04-29: 81 mg via ORAL
  Filled 2022-04-28: qty 1

## 2022-04-28 MED ORDER — SENNOSIDES-DOCUSATE SODIUM 8.6-50 MG PO TABS: 1.0000 | ORAL_TABLET | Freq: Every evening | ORAL | Status: DC | PRN

## 2022-04-28 MED ORDER — INSULIN ASPART 100 UNIT/ML IJ SOLN
0.0000 [IU] | Freq: Three times a day (TID) | INTRAMUSCULAR | Status: DC
  Administered 2022-04-29: 2 [IU] via SUBCUTANEOUS

## 2022-04-28 MED ORDER — PANTOPRAZOLE SODIUM 40 MG IV SOLR
40.0000 mg | Freq: Once | INTRAVENOUS | Status: AC
  Administered 2022-04-28: 40 mg via INTRAVENOUS
  Filled 2022-04-28: qty 10

## 2022-04-28 MED ORDER — ALUM & MAG HYDROXIDE-SIMETH 200-200-20 MG/5ML PO SUSP
30.0000 mL | Freq: Once | ORAL | Status: AC
  Administered 2022-04-28: 30 mL via ORAL
  Filled 2022-04-28: qty 30

## 2022-04-28 MED ORDER — SERTRALINE HCL 50 MG PO TABS
25.0000 mg | ORAL_TABLET | Freq: Every day | ORAL | Status: DC
  Filled 2022-04-28 (×2): qty 1

## 2022-04-28 MED ORDER — HYDROCODONE-ACETAMINOPHEN 5-325 MG PO TABS
1.0000 | ORAL_TABLET | Freq: Four times a day (QID) | ORAL | Status: DC | PRN
  Administered 2022-04-29 (×2): 1 via ORAL
  Filled 2022-04-28 (×2): qty 1

## 2022-04-28 MED ORDER — METOCLOPRAMIDE HCL 5 MG/ML IJ SOLN
10.0000 mg | Freq: Three times a day (TID) | INTRAMUSCULAR | Status: DC
  Administered 2022-04-28 – 2022-04-29 (×2): 10 mg via INTRAVENOUS
  Filled 2022-04-28 (×2): qty 2

## 2022-04-28 MED ORDER — MORPHINE SULFATE (PF) 4 MG/ML IV SOLN
4.0000 mg | Freq: Once | INTRAVENOUS | Status: AC
  Administered 2022-04-28: 4 mg via INTRAVENOUS
  Filled 2022-04-28: qty 1

## 2022-04-28 MED ORDER — HEPARIN BOLUS VIA INFUSION
4000.0000 [IU] | Freq: Once | INTRAVENOUS | Status: AC
  Administered 2022-04-28: 4000 [IU] via INTRAVENOUS
  Filled 2022-04-28: qty 4000

## 2022-04-28 MED ORDER — INSULIN GLARGINE-YFGN 100 UNIT/ML ~~LOC~~ SOLN
6.0000 [IU] | Freq: Every day | SUBCUTANEOUS | Status: DC
  Administered 2022-04-28: 6 [IU] via SUBCUTANEOUS
  Filled 2022-04-28 (×2): qty 0.06

## 2022-04-28 MED ORDER — HEPARIN (PORCINE) 25000 UT/250ML-% IV SOLN
1100.0000 [IU]/h | INTRAVENOUS | Status: DC
  Administered 2022-04-28: 1100 [IU]/h via INTRAVENOUS
  Filled 2022-04-28: qty 250

## 2022-04-28 MED ORDER — PANTOPRAZOLE SODIUM 40 MG PO TBEC
40.0000 mg | DELAYED_RELEASE_TABLET | Freq: Two times a day (BID) | ORAL | Status: DC
  Administered 2022-04-28 – 2022-04-29 (×2): 40 mg via ORAL
  Filled 2022-04-28 (×2): qty 1

## 2022-04-28 MED ORDER — METOCLOPRAMIDE HCL 5 MG/ML IJ SOLN
10.0000 mg | Freq: Once | INTRAMUSCULAR | Status: AC
  Administered 2022-04-28: 10 mg via INTRAVENOUS
  Filled 2022-04-28: qty 2

## 2022-04-28 MED ORDER — ACETAMINOPHEN 650 MG RE SUPP: 650.0000 mg | Freq: Four times a day (QID) | RECTAL | Status: DC | PRN

## 2022-04-28 NOTE — Assessment & Plan Note (Signed)
Creatinine mildly elevated at 1.33, baseline appears around 1.0.  Start gentle IV fluid hydration tonight.  Hold Jardiance, metformin.  Repeat labs in AM.

## 2022-04-28 NOTE — ED Notes (Addendum)
Pt requesting to leave, attempted to educate pt on importance of waiting for lab results. PA Merleen Nicely made aware, PA to bedside.

## 2022-04-28 NOTE — ED Triage Notes (Signed)
Pt arrives to ED from Home BIB [facility] due to Chest Pain. Per EMS pt began having Nausea this morning around 6am and Left Side Chest Pain around 10:30am. 1 nitro sublingual, Zofran IV and '324mg'$  Aspirin administered by EMS. Denies vomitting. Pain 6/10. Hx HTN and Diabetes. Pt A/O x4.  BP 180/90 HR 74 O2 99% RA CBG 299

## 2022-04-28 NOTE — ED Provider Notes (Signed)
[facility] EMERGENCY DEPARTMENT Provider Note   CSN: [id number] Arrival date & time: 04/28/22  1132     History  Chief Complaint  Patient presents with   Chest Pain    Justin Key is a 71 y.o. male.  Justin Key is a 72 y.o. male with a history of hypertension, hyperlipidemia, coronary artery disease, diabetes, who presents to the emergency department for evaluation of chest pain, vomiting and abdominal pain.  Patient arrives via EMS reporting that around 6 AM this morning he started having nausea and vomiting and then began experiencing left-sided chest pain around 10:30 AM.  He also reports some upper abdominal pain.  He reports multiple back surgeries and chronic back pain, from the heaving with vomiting he started to have back pain as well.  He received nitro, Zofran and aspirin by EMS without much improvement.  History of hypertension but reports he is only on metoprolol for high blood pressure.  Followed by Dr. 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  Reports that he has a history of gastroparesis with vomiting but does not typically have chest pain with this.  Patient also reports daily marijuana use.  The history is provided by the patient, the spouse and medical records.  Chest Pain Associated symptoms: abdominal pain, back pain, nausea and vomiting   Associated symptoms: no cough, no dizziness, no fever and no shortness of breath        Home Medications Prior to Admission medications   Medication Sig Start Date End Date Taking? Authorizing Provider  aspirin EC 81 MG tablet Take 1 tablet (81 mg total) by mouth daily. 10/18/18  Yes Belva Crome, MD  carboxymethylcellulose (REFRESH PLUS) 0.5 % SOLN Place 1 drop into both eyes 3 (three) times daily as needed (dry eyes).   Yes [provider]  empagliflozin (JARDIANCE) 10 MG TABS tablet Take 1 tablet (10  mg total) by mouth daily. 06/16/21  Yes   glimepiride (AMARYL) 4 MG tablet TAKE 2 TABLETS BY MOUTH ONCE A DAY 11/24/21  Yes   insulin degludec (TRESIBA FLEXTOUCH) 100 UNIT/ML FlexTouch Pen Inject 8 units into the skin every evening 03/06/22  Yes   lidocaine (LIDODERM) 5 % Apply 2 patches to affected area daily for up to 12 hours.  Remove for 12 hours & repeat as directed. Patient taking differently: Place 1-2 patches onto the skin every 12 (twelve) hours as needed (pain). 06/18/21  Yes   metFORMIN (GLUCOPHAGE-XR) 500 MG 24 hr tablet TAKE 1 TABLET BY MOUTH TWO TIMES DAILY 02/20/22  Yes   metoCLOPramide (REGLAN) 5 MG tablet TAKE 1 TABLET BY MOUTH 2 TIMES DAILY BEFORE MEALS AS NEEDED Patient taking differently: Take 5 mg by mouth 2 (two) times daily as needed for nausea or vomiting. 04/08/22  Yes   metoprolol succinate (TOPROL-XL) 25 MG 24 hr tablet Take 1 tablet (25 mg total) by mouth daily. 02/20/22  Yes Belva Crome, MD  mirtazapine (REMERON) 15 MG tablet Take 1 tablet (15 mg total) by mouth at bedtime. 06/25/21  Yes Gatha Mayer, MD  pantoprazole (PROTONIX) 40 MG tablet Take 1 tablet (40 mg total) by mouth 2 (two) times daily. 09/02/20  Yes   sertraline (ZOLOFT) 25 MG tablet Take 1 tablet by mouth daily. 02/09/22  Yes   sitaGLIPtin (JANUVIA) 100 MG tablet Take 1 tablet (100 mg total) by mouth daily. 06/16/21  Yes  Vibegron (GEMTESA) 75 MG TABS Take 1 tablet by mouth daily.   Yes [provider]  amoxicillin (AMOXIL) 500 MG capsule Take 1 capsule by mouth three times daily until gone. Patient not taking: Reported on 04/28/2022 03/17/22     clindamycin (CLEOCIN) 150 MG capsule Take all 4 capsules by mouth 1 hour prior to surgery Patient not taking: Reported on 04/28/2022 03/16/22     glucose blood (FREESTYLE LITE) test strip Use to check blood glucose 2 times daily 11/04/21   Kathyrn Lass, MD  HYDROcodone-acetaminophen (NORCO/VICODIN) 5-325 MG tablet Take 1 tablet by mouth every 6 hours if needed for  pain. Patient not taking: Reported on 04/28/2022 03/17/22     ibuprofen (ADVIL,MOTRIN) 200 MG tablet Take 600-800 mg by mouth every 8 (eight) hours as needed for moderate pain.     [provider]  insulin degludec (TRESIBA FLEXTOUCH) 100 UNIT/ML FlexTouch Pen Inject 8 units subcutaneous every evening Patient not taking: Reported on 04/28/2022 06/16/21     Insulin Pen Needle (BD PEN NEEDLE NANO 2ND GEN) 32G X 4 MM MISC Use as directed once a day 03/09/22     Insulin Pen Needle 32G X 4 MM MISC Use as directed daily 06/16/21     nitroGLYCERIN (NITROSTAT) 0.4 MG SL tablet Place 1 tablet (0.4 mg total) under the tongue every 5 (five) minutes as needed for chest pain. Patient not taking: Reported on 04/08/2021 10/18/18 04/08/21  Belva Crome, MD  ofloxacin (OCUFLOX) 0.3 % ophthalmic solution Instill 1 drop into right eye 4 times daily for 1 week Patient not taking: Reported on 04/28/2022 02/09/22     ofloxacin (OCUFLOX) 0.3 % ophthalmic solution Place 1 drop into right eye 4 times a day for 1 week Patient not taking: Reported on 04/28/2022 02/19/22     prednisoLONE acetate (PRED FORTE) 1 % ophthalmic suspension Instill 1 drop into right eye 4 times a day for 1 week, then 2  times a day for 1 week, then daily for 2 weeks then STOP. Patient not taking: Reported on 04/28/2022 01/28/22     prednisoLONE acetate (PRED FORTE) 1 % ophthalmic suspension Instill 1 drop into right eye 4 times a day for 1 week, then 2  times a day for 1 week, then daily for 2 weeks then STOP Patient not taking: Reported on 04/28/2022 02/09/22     prednisoLONE acetate (PRED FORTE) 1 % ophthalmic suspension Place 1 drop into right eye 4 times a day for 1 week, then 2 times a day for 1 week, then daily for 2 weeks then STOP Patient not taking: Reported on 04/28/2022 02/19/22     sodium fluoride (PREVIDENT 5000 PLUS) 1.1 % CREA dental cream Apply pea sized amount to toothbrush, brush thoroughly for 2 minutes preferably at bedtime (Don't eat, drink or  rinse for 30 minutes) 12/30/21         Allergies    Humibid la [guaifenesin]    Review of Systems   Review of Systems  Constitutional:  Negative for chills and fever.  Respiratory:  Negative for cough and shortness of breath.   Cardiovascular:  Positive for chest pain.  Gastrointestinal:  Positive for abdominal pain, nausea and vomiting.  Musculoskeletal:  Positive for back pain.  Skin:  Negative for color change and rash.  Neurological:  Negative for dizziness, syncope and light-headedness.    Physical Exam Updated Vital Signs BP (!) 146/83   Pulse 74   Temp 99 F (37.2 C) (Oral)  Resp 13   Ht '6\' 3"'$  (1.905 m)   Wt 84.8 kg   SpO2 99%   BMI 23.37 kg/m  Physical Exam Vitals and nursing note reviewed.  Constitutional:      General: He is not in acute distress.    Appearance: Normal appearance. He is well-developed. He is ill-appearing. He is not diaphoretic.     Comments: Alert, somewhat ill-appearing, appears uncomfortable  HENT:     Head: Normocephalic and atraumatic.  Eyes:     General:        Right eye: No discharge.        Left eye: No discharge.     Pupils: Pupils are equal, round, and reactive to light.  Cardiovascular:     Rate and Rhythm: Normal rate and regular rhythm.     Pulses:          Radial pulses are 2+ on the right side and 2+ on the left side.       Dorsalis pedis pulses are 2+ on the right side and 0 on the left side.       Posterior tibial pulses are 2+ on the right side and detected w/ Doppler on the left side.     Heart sounds: Normal heart sounds.  Pulmonary:     Effort: Pulmonary effort is normal. No respiratory distress.     Breath sounds: Normal breath sounds. No wheezing or rales.     Comments: Respirations equal and unlabored, patient able to speak in full sentences, lungs clear to auscultation bilaterally  Chest:     Chest wall: No tenderness.  Abdominal:     General: Bowel sounds are normal. There is no distension.     Palpations:  Abdomen is soft. There is no mass.     Tenderness: There is abdominal tenderness. There is no guarding.     Comments: Abdomen soft, nondistended, mild generalized tenderness, most noted in the epigastric region  Musculoskeletal:        General: No deformity.     Cervical back: Neck supple.     Comments: Left foot appears slightly dusky, still warm and equal in temperature compared to the right foot, dopplerable PT pulse but no dopplerable or palpable DP pulse.  Small wound to the tip of the left great toe that patient reports he stubbed earlier today, no active bleeding or signs of infection  Skin:    General: Skin is warm and dry.     Capillary Refill: Capillary refill takes less than 2 seconds.  Neurological:     Mental Status: He is alert and oriented to person, place, and time.     Coordination: Coordination normal.     Comments: Speech is clear, able to follow commands Moves extremities without ataxia, coordination intact  Psychiatric:        Mood and Affect: Mood normal.        Behavior: Behavior normal.     ED Results / Procedures / Treatments   Labs (all labs ordered are listed, but only abnormal results are displayed) Labs Reviewed  BASIC METABOLIC PANEL - Abnormal; Notable for the following components:      Result Value   CO2 21 (*)    Glucose, Bld 243 (*)    Creatinine, Ser 1.33 (*)    GFR, Estimated 58 (*)    All other components within normal limits  CBC - Abnormal; Notable for the following components:   WBC 11.2 (*)    All other components  within normal limits  HEPATIC FUNCTION PANEL - Abnormal; Notable for the following components:   Albumin 3.4 (*)    All other components within normal limits  TROPONIN I (HIGH SENSITIVITY) - Abnormal; Notable for the following components:   Troponin I (High Sensitivity) 29 (*)    All other components within normal limits  TROPONIN I (HIGH SENSITIVITY) - Abnormal; Notable for the following components:   Troponin I (High  Sensitivity) 46 (*)    All other components within normal limits  LIPASE, BLOOD  PROTIME-INR  HEPARIN LEVEL (UNFRACTIONATED)    EKG EKG Interpretation  Date/Time:  Tuesday April 28 2022 12:56:41 EDT Ventricular Rate:  90 PR Interval:  158 QRS Duration: 95 QT Interval:  383 QTC Calculation: 469 R Axis:   82 Text Interpretation: Sinus rhythm Ventricular premature complex Probable left atrial enlargement Borderline right axis deviation Confirmed by Davonna Belling [phone]) on 04/28/2022 7:21:54 PM  Radiology CT Angio Chest/Abd/Pel for Dissection W and/or Wo Contrast  Result Date: 04/28/2022 CLINICAL DATA:  Chest and back pain as well as possible lower extremity ischemia. Evaluate for acute dissection EXAM: CT ANGIOGRAPHY CHEST, ABDOMEN AND PELVIS AND RUNOFF TECHNIQUE: Multidetector CT imaging through the chest, abdomen and pelvis was performed using the standard protocol during bolus administration of intravenous contrast. Multiplanar reconstructed images and MIPs were obtained and reviewed to evaluate the vascular anatomy. RADIATION DOSE REDUCTION: This exam was performed according to the departmental dose-optimization program which includes automated exposure control, adjustment of the mA and/or kV according to patient size and/or use of iterative reconstruction technique. CONTRAST:  171m OMNIPAQUE IOHEXOL 350 MG/ML SOLN COMPARISON:  Prior CT scan of the chest 04/14/2021; CT scan of the abdomen and pelvis 4101 FINDINGS: CTA CHEST FINDINGS Cardiovascular: 4 vessel arch anatomy. The left vertebral artery arises directly from the aorta. Mild scattered atherosclerotic plaque. No evidence of aneurysm or dissection. The heart is normal in size. Concentric hypertrophy of the left ventricular myocardium suggests systemic arterial hypertension. Atherosclerotic plaque present along the coronary arteries. The main pulmonary artery is normal in size. No pericardial effusion. Mediastinum/Nodes: Unremarkable  CT appearance of the thyroid gland. No suspicious mediastinal or hilar adenopathy. No soft tissue mediastinal mass. Diffuse mild thickening of the distal esophagus measuring up to 7 mm. Lungs/Pleura: Several emphysematous blebs are present in the lung apices. Otherwise, the lungs are largely clear. No pleural effusion or pneumothorax. Musculoskeletal: No chest wall abnormality. No acute or significant osseous findings. Review of the MIP images confirms the above findings. CTA ABDOMEN AND PELVIS FINDINGS VASCULAR Aorta: Extensive heterogeneous atherosclerotic plaque throughout the abdominal aorta. The aorta is mildly ectatic but not aneurysmal measuring only up to 2.5 cm. No evidence of dissection. Celiac: Patent without evidence of aneurysm, dissection, vasculitis or significant stenosis. Lateral segmental branch of the left hepatic artery is replaced to the left gastric artery. SMA: Patent without evidence of aneurysm, dissection, vasculitis or significant stenosis. Renals: Solitary renal arteries bilaterally. Heterogeneous atherosclerotic plaque results in mild to moderate stenosis bilaterally. No changes of fibromuscular dysplasia. IMA: Patent at the origin. There is a high-grade stenosis approximately 2 cm from the origin secondary to mixed calcified and fibrofatty atherosclerotic plaque. The artery remains patent. RIGHT Lower Extremity Inflow: Extensive heterogeneous atherosclerotic plaque throughout the common, internal and external iliac arteries. Moderate stenosis at the origin of the internal iliac artery. Probable mild to moderate stenosis of the proximal external iliac artery. The vessels remain patent. No evidence of aneurysm or dissection. Outflow: Common femoral artery  is mildly disease. Profunda femoral branches are widely patent. Moderate stenosis at the origin of the superficial femoral artery. The superficial femoral artery is then diffusely diseased with multiple focal areas of calcified and  fibrofatty atherosclerotic plaque. Focal moderate to high-grade stenosis distally in the abductor canal. The artery remains patent. Focal high-grade stenosis of the P1 segment of the popliteal artery. The remainder of the popliteal artery is diseased but patent. Runoff: Proximal occlusion of the anterior tibial artery. The posterior tibial artery remains patent into the foot. LEFT Lower Extremity Inflow: Extensive heterogeneous atherosclerotic plaque without significant stenosis throughout the common iliac artery. Occlusion of the origin of the internal iliac artery. The external iliac artery is diffusely disease but there is no focal stenosis. Outflow: The common femoral artery is mildly disease but remains patent. Profunda femoral branches are widely patent. Complete occlusion of the SFA beginning shortly after the origin. The occlusion extends through the entire SFA and the P1 and P2 segments of the popliteal artery. The popliteal artery then reconstitutes just below the patella. Runoff: Somewhat limited evaluation of the runoff arteries secondary to diffuse disease proximally. The posterior tibial artery appears patent to the ankle. The anterior tibial artery is diffusely occluded. The posterior tibial artery occludes proximally but then reconstitutes and remains patent to the ankle. Veins: No focal venous abnormality. Review of the MIP images confirms the above findings. NON-VASCULAR Hepatobiliary: No focal liver abnormality is seen. No gallstones, gallbladder wall thickening, or biliary dilatation. Pancreas: Unremarkable. No pancreatic ductal dilatation or surrounding inflammatory changes. Spleen: Normal in size without focal abnormality. Adrenals/Urinary Tract: Diffuse adreniform thickening of the glands bilaterally consistent with adrenal hyperplasia. Numerous circumscribed water attenuation simple cysts throughout both kidneys. No hydronephrosis, nephrolithiasis or enhancing renal mass. Stomach/Bowel:  Colonic diverticular disease without CT evidence of active inflammation. No evidence of obstruction or focal bowel wall thickening. Normal appendix in the right lower quadrant. The terminal ileum is unremarkable. Lymphatic: No suspicious lymphadenopathy. Reproductive: Prostate is unremarkable. Other: No abdominal wall hernia or abnormality. No abdominopelvic ascites. Musculoskeletal: L1-L5 posterior lumbar interbody fusion with interbody grafts at each level. No evidence of immediate hardware complication. Focal degenerative disc disease present at T11-T12. Review of the MIP images confirms the above findings. IMPRESSION: VASCULAR 1. No evidence of acute aortic dissection or aneurysm. 2. 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 The popliteal artery then reconstitutes just inferior to the patella. 3. Left lower extremity runoff disease with single-vessel runoff to the ankle in the form of the peroneal artery. The posterior tibial artery is occluded proximally but then reconstitutes. 4. More mild peripheral arterial disease throughout the right lower extremity as detailed above. 5. Concentric hypertrophy of the left ventricular myocardium suggests systemic arterial hypertension. 6. Aortic and coronary artery atherosclerotic calcifications. NON VASCULAR 1. No acute abnormality within the chest, abdomen or pelvis. 2. Diffuse mild thickening of the distal esophagus measuring up to 7 mm. Differential considerations include esophagitis related to GERD, infectious/inflammatory esophagitis, and less likely malignancy. 3. Adrenal hyperplasia. 4. 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 5. 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 6. 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 Electronically Signed    By: Jacqulynn Cadet M.D.   On: 04/28/2022 15:49   CT Angio Aortobifemoral W and/or Wo Contrast  Result Date: 04/28/2022 CLINICAL  DATA:  Chest and back pain as well as possible lower extremity ischemia. Evaluate for acute dissection EXAM: CT ANGIOGRAPHY CHEST, ABDOMEN AND PELVIS AND RUNOFF TECHNIQUE: Multidetector CT imaging through the chest, abdomen and pelvis was performed using the standard protocol during bolus administration of intravenous contrast. Multiplanar reconstructed images and MIPs were obtained and reviewed to evaluate the vascular anatomy. RADIATION DOSE REDUCTION: This exam was performed according to the departmental dose-optimization program which includes automated exposure control, adjustment of the mA and/or kV according to patient size and/or use of iterative reconstruction technique. CONTRAST:  18m OMNIPAQUE IOHEXOL 350 MG/ML SOLN COMPARISON:  Prior CT scan of the chest 04/14/2021; CT scan of the abdomen and pelvis 4101 FINDINGS: CTA CHEST FINDINGS Cardiovascular: 4 vessel arch anatomy. The left vertebral artery arises directly from the aorta. Mild scattered atherosclerotic plaque. No evidence of aneurysm or dissection. The heart is normal in size. Concentric hypertrophy of the left ventricular myocardium suggests systemic arterial hypertension. Atherosclerotic plaque present along the coronary arteries. The main pulmonary artery is normal in size. No pericardial effusion. Mediastinum/Nodes: Unremarkable CT appearance of the thyroid gland. No suspicious mediastinal or hilar adenopathy. No soft tissue mediastinal mass. Diffuse mild thickening of the distal esophagus measuring up to 7 mm. Lungs/Pleura: Several emphysematous blebs are present in the lung apices. Otherwise, the lungs are largely clear. No pleural effusion or pneumothorax. Musculoskeletal: No chest wall abnormality. No acute or significant osseous findings. Review of the MIP images confirms the above findings. CTA ABDOMEN  AND PELVIS FINDINGS VASCULAR Aorta: Extensive heterogeneous atherosclerotic plaque throughout the abdominal aorta. The aorta is mildly ectatic but not aneurysmal measuring only up to 2.5 cm. No evidence of dissection. Celiac: Patent without evidence of aneurysm, dissection, vasculitis or significant stenosis. Lateral segmental branch of the left hepatic artery is replaced to the left gastric artery. SMA: Patent without evidence of aneurysm, dissection, vasculitis or significant stenosis. Renals: Solitary renal arteries bilaterally. Heterogeneous atherosclerotic plaque results in mild to moderate stenosis bilaterally. No changes of fibromuscular dysplasia. IMA: Patent at the origin. There is a high-grade stenosis approximately 2 cm from the origin secondary to mixed calcified and fibrofatty atherosclerotic plaque. The artery remains patent. RIGHT Lower Extremity Inflow: Extensive heterogeneous atherosclerotic plaque throughout the common, internal and external iliac arteries. Moderate stenosis at the origin of the internal iliac artery. Probable mild to moderate stenosis of the proximal external iliac artery. The vessels remain patent. No evidence of aneurysm or dissection. Outflow: Common femoral artery is mildly disease. Profunda femoral branches are widely patent. Moderate stenosis at the origin of the superficial femoral artery. The superficial femoral artery is then diffusely diseased with multiple focal areas of calcified and fibrofatty atherosclerotic plaque. Focal moderate to high-grade stenosis distally in the abductor canal. The artery remains patent. Focal high-grade stenosis of the P1 segment of the popliteal artery. The remainder of the popliteal artery is diseased but patent. Runoff: Proximal occlusion of the anterior tibial artery. The posterior tibial artery remains patent into the foot. LEFT Lower Extremity Inflow: Extensive heterogeneous atherosclerotic plaque without significant stenosis throughout  the common iliac artery. Occlusion of the origin of the internal iliac artery. The external iliac artery is diffusely disease but there is no focal stenosis. Outflow: The common femoral artery is mildly disease but remains patent. Profunda femoral branches are widely patent. Complete occlusion of the SFA beginning shortly after the origin. The occlusion extends through the entire SFA and the P1 and P2 segments of the popliteal artery. The popliteal  artery then reconstitutes just below the patella. Runoff: Somewhat limited evaluation of the runoff arteries secondary to diffuse disease proximally. The posterior tibial artery appears patent to the ankle. The anterior tibial artery is diffusely occluded. The posterior tibial artery occludes proximally but then reconstitutes and remains patent to the ankle. Veins: No focal venous abnormality. Review of the MIP images confirms the above findings. NON-VASCULAR Hepatobiliary: No focal liver abnormality is seen. No gallstones, gallbladder wall thickening, or biliary dilatation. Pancreas: Unremarkable. No pancreatic ductal dilatation or surrounding inflammatory changes. Spleen: Normal in size without focal abnormality. Adrenals/Urinary Tract: Diffuse adreniform thickening of the glands bilaterally consistent with adrenal hyperplasia. Numerous circumscribed water attenuation simple cysts throughout both kidneys. No hydronephrosis, nephrolithiasis or enhancing renal mass. Stomach/Bowel: Colonic diverticular disease without CT evidence of active inflammation. No evidence of obstruction or focal bowel wall thickening. Normal appendix in the right lower quadrant. The terminal ileum is unremarkable. Lymphatic: No suspicious lymphadenopathy. Reproductive: Prostate is unremarkable. Other: No abdominal wall hernia or abnormality. No abdominopelvic ascites. Musculoskeletal: L1-L5 posterior lumbar interbody fusion with interbody grafts at each level. No evidence of immediate hardware  complication. Focal degenerative disc disease present at T11-T12. Review of the MIP images confirms the above findings. IMPRESSION: VASCULAR 1. No evidence of acute aortic dissection or aneurysm. 2. 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 The popliteal artery then reconstitutes just inferior to the patella. 3. Left lower extremity runoff disease with single-vessel runoff to the ankle in the form of the peroneal artery. The posterior tibial artery is occluded proximally but then reconstitutes. 4. More mild peripheral arterial disease throughout the right lower extremity as detailed above. 5. Concentric hypertrophy of the left ventricular myocardium suggests systemic arterial hypertension. 6. Aortic and coronary artery atherosclerotic calcifications. NON VASCULAR 1. No acute abnormality within the chest, abdomen or pelvis. 2. Diffuse mild thickening of the distal esophagus measuring up to 7 mm. Differential considerations include esophagitis related to GERD, infectious/inflammatory esophagitis, and less likely malignancy. 3. Adrenal hyperplasia. 4. 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 5. 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 6. 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 Electronically Signed   By: Jacqulynn Cadet M.D.   On: 04/28/2022 15:49   DG Chest 2 View  Result Date: 04/28/2022 CLINICAL DATA:  Left chest pain EXAM: CHEST - 2 VIEW COMPARISON:  Chest radiograph done on 12/30/2019 and CT chest done on 04/14/2021 FINDINGS: Cardiac size is within normal limits. There are no signs of pulmonary edema or focal pulmonary consolidation. There is no pleural effusion or pneumothorax. There is previous surgical fusion in lumbar spine. IMPRESSION: No active cardiopulmonary disease.  Electronically Signed   By: Elmer Picker M.D.   On: 04/28/2022 12:13    Procedures .Critical Care  Performed by: Jacqlyn Larsen, PA-C Authorized by: Jacqlyn Larsen, PA-C   Critical care provider statement:    Critical care time (minutes):  30   Critical care was necessary to treat or prevent imminent or life-threatening deterioration of the following conditions:  Cardiac failure   Critical care was time spent personally by me on the following activities:  Development of treatment plan with patient or surrogate, discussions with consultants, evaluation of patient's response to treatment, examination of patient, ordering and review of laboratory studies, ordering and review of radiographic studies,  ordering and performing treatments and interventions, pulse oximetry, re-evaluation of patient's condition and review of old charts     Medications Ordered in ED Medications  heparin ADULT infusion 100 units/mL (25000 units/268m) (has no administration in time range)  heparin bolus via infusion 4,000 Units (has no administration in time range)  metoCLOPramide (REGLAN) injection 10 mg (10 mg Intravenous Given 04/28/22 1248)  morphine (PF) 4 MG/ML injection 4 mg (4 mg Intravenous Given 04/28/22 1248)  labetalol (NORMODYNE) injection 10 mg (10 mg Intravenous Given 04/28/22 1248)  iohexol (OMNIPAQUE) 350 MG/ML injection 100 mL (100 mLs Intravenous Contrast Given 04/28/22 1503)  ondansetron (ZOFRAN) injection 4 mg (4 mg Intravenous Given 04/28/22 1705)  pantoprazole (PROTONIX) injection 40 mg (40 mg Intravenous Given 04/28/22 1707)  alum & mag hydroxide-simeth (MAALOX/MYLANTA) 200-200-20 MG/5ML suspension 30 mL (30 mLs Oral Given 04/28/22 1708)    ED Course/ Medical Decision Making/ A&P                           Medical Decision Making Amount and/or Complexity of Data Reviewed Labs: ordered. Radiology: ordered.  Risk OTC drugs. Prescription drug management. Decision regarding  hospitalization.   LHECTOR TAFTis a 71y.o. male presents to the ED for concern of chest pain, abdominal pain, back pain, vomiting, lower extremity discoloration, this involves an extensive number of treatment options, and is a complaint that carries with it a high risk of complications and morbidity.  The differential diagnosis includes aortic dissection, ACS, PE, peripheral arterial disease, limb ischemia, gastritis, gastroparesis, hyperemesis   Additional history obtained:  Additional history obtained from wife at bedside External records from outside source obtained and reviewed including prior ED visits and outpatient cardiology follow-up, prior heart cath in 2019   Lab Tests:  I Ordered, reviewed, and interpreted labs.  The pertinent results include: Mild leukocytosis of 11.2, normal hemoglobin, renal function slightly worsening from baseline, glucose 243, normal anion gap, no other electrolyte derangements.  Initial troponin 29, lipase and LFTs unremarkable.  Delta troponin increased to 46   Imaging Studies ordered:  I ordered imaging studies including chest x-ray, CTA dissection study and lower extremity runoff CTA I independently visualized and interpreted imaging which showed chest x-ray without active cardiopulmonary disease.  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  Diffuse mild thickening of the esophagus I agree with the radiologist interpretation   Cardiac Monitoring:  The patient was maintained on a cardiac monitor.  I personally viewed and interpreted the cardiac monitored which showed an underlying rhythm of: Normal sinus rhythm   Medicines ordered and prescription drug management:  I ordered medication including labetalol for hypertension as well as pain and nausea medication, Protonix and Maalox given for possible esophagitis.  With elevation  of second troponin heparin therapy initiated Reevaluation of the patient after these medicines showed that the patient improved I have reviewed the patients home medicines and have made adjustments as needed   ED Course:  Patient presents with vomiting, chest and abdominal pain.  Appears uncomfortable on arrival.  History of gastroparesis but not typically with chest pain, concern for potential ACS or dissection.  Patient also with dusky left foot which wife reports has been a more chronic issue over the past month at least occurring intermittently.  Foot is still warm to the touch but unable to Doppler DP pulse.  Will get CTA  runoff of the lower extremities as well. CTA without evidence of dissection, chronic appearing occlusive arterial disease Initial troponin mildly elevated, pain improved with medication here in the ED and vomiting improved after Reglan and Zofran. Delta troponin increasing to 46.  Consult to cardiology. Care signed out to Dr. Alvino Chapel pending cardiology consultation and admission.  Heparin started.   Consultations Obtained:  I requested consultation with the vascular surgeon,  and discussed lab and imaging findings as well as pertinent plan -Dr. Doren Custard, this all seems chronic and patient has collateral flow, recommends outpatient follow-up with vascular surgery but no acute intervention Care signed out to Dr. Alvino Chapel pending cardiology consult and admission   Dispostion:  After consideration of the diagnostic results and the patients response to treatment feel that the patent would benefit from admission.          Final Clinical Impression(s) / ED Diagnoses Final diagnoses:  Left-sided chest pain  Elevated troponin  Generalized abdominal pain  Peripheral arterial disease [facility])    Rx / DC Orders ED Discharge Orders     None         Janet Berlin 04/28/22 1946    Davonna Belling, MD 04/28/22 [phone]

## 2022-04-28 NOTE — Hospital Course (Signed)
Justin Key is a 71 y.o. male with medical history significant for mod-severe multivessel CAD, PVD, insulin-dependent T2DM with gastroparesis, HTN, HLD, GERD with tortuous Barrett's esophagus, chronic back pain who is admitted for chest pain evaluation.

## 2022-04-28 NOTE — Progress Notes (Signed)
ANTICOAGULATION CONSULT NOTE - Initial Consult  Pharmacy Consult for Heparin Indication: chest pain/ACS  Allergies  Allergen Reactions   Humibid La [Guaifenesin] Other (See Comments)    hallucinations    Patient Measurements:   Heparin Dosing Weight: 84.8 kg  Vital Signs: Temp: 99 F (37.2 C) (08/08 1603) Temp Source: Oral (08/08 1603) BP: 146/83 (08/08 1815) Pulse Rate: 74 (08/08 1815)  Labs: Recent Labs    04/28/22 1209 04/28/22 1704  HGB 14.9  --   HCT 45.1  --   PLT 236  --   CREATININE 1.33*  --   TROPONINIHS 29* 46*    CrCl cannot be calculated (Unknown ideal weight.).   Medical History: Past Medical History:  Diagnosis Date   Adenomatous polyps    Back pain    Barrett's esophagus    Coronary artery disease    Diabetes mellitus without complication (HCC)    Failure to thrive in adult    Gastroparesis    GERD (gastroesophageal reflux disease)    Hyperlipidemia    Hypertension    Lumbar disc disease    Lumbar spinal stenosis    Myocardial infarction [facility]) 1997   Thoracic myelopathy 2020    Medications:  (Not in a hospital admission)  Scheduled:  Infusions:  PRN:   Assessment: 63 yom with a history of HTN, HLD, CAD, DM. Patient is presenting with chest pain. Heparin per pharmacy consult placed for chest pain/ACS.  Patient is not on anticoagulation prior to arrival.  Hgb 14.9; plt 236  Goal of Therapy:  Heparin level 0.3-0.7 units/ml Monitor platelets by anticoagulation protocol: Yes   Plan:  Give IV heparin 4000 units bolus x 1 Start heparin infusion at 1100 units/hr Check anti-Xa level at 0200 and daily while on heparin Continue to monitor H&H and platelets  Lorelei Pont, PharmD, BCPS 04/28/2022 7:15 PM ED Clinical Pharmacist -  [phone]

## 2022-04-28 NOTE — Assessment & Plan Note (Signed)
Not on statin due to intolerance.

## 2022-04-28 NOTE — H&P (Signed)
History and Physical    Justin Key VVO:[id number] DOB: 11-Dec-1950 DOA: 04/28/2022  PCP: Kathyrn Lass, MD  Patient coming from: Home  I have personally briefly reviewed patient's old medical records in [facility]  Chief Complaint: Chest pain  HPI: Justin Key is a 71 y.o. male with medical history significant for mod-severe multivessel CAD, PVD, insulin-dependent T2DM with gastroparesis, HTN, HLD, GERD with tortuous Barrett's esophagus, chronic back pain who presented to the ED for evaluation of chest pain.  Patient reports chronic issues with gastroparesis.  When he woke this morning he had typical gastroparesis symptoms with nausea and emesis.  Shortly afterwards he developed sharp midsternal chest pain as well as left axillary pain.  This was not typical of his usual gastroparesis.  He had associated diaphoresis without dyspnea.  He says he has not had any chest pain along time.  He did not take sublingual nitroglycerin as his medications were expired.  EMS were called.  He was given 1 sublingual nitroglycerin which did not immediately improve his chest pain.  He was also given aspirin 324 mg and IV Zofran.  BP was 180/90 with heart rate 74 and SPO2 99% on room air per ED documentation.  Patient reports taking aspirin 81 mg daily.  He does not take any statin due to reported intolerance in the past described as his joints locking up.  ED Course  Labs/Imaging on admission: I have personally reviewed following labs and imaging studies.  Initial vitals showed BP 194/96, pulse 75, RR 18, temp 98.2 F, SPO2 99% on room air.  Labs show sodium 138, potassium 4.1, bicarb 21, BUN 16, creatinine 1.33, serum glucose 243, WBC 11.2, hemoglobin 14.9, platelets 236,000, LFTs within normal limits, lipase 24, troponin 29 > 46.  2 view chest x-ray negative for focal consolidation, edema, effusion.  CTA chest/abdomen/pelvis was negative for acute abnormality.  Diffuse mild thickening of  the distal esophagus noted.  Diverticulosis without diverticulitis also reported.  CTA aorta and bifemoral study was negative for acute aortic dissection or aneurysm.  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  Mild PAD throughout the right lower extremity seen.  Aortic and coronary artery atherosclerotic calcification seen.  EDP spoke with on-call vascular surgery who reviewed findings and felt peripheral vascular findings all seem chronic with collateral flow.  Outpatient follow-up without acute intervention recommended.  EDP spoke with on-call cardiology who recommended medical admission for chest pain management, no indication for cardiac cath at this time, no other specific recommendations.  Patient was started on IV heparin.  Also received IV morphine, labetalol, Reglan, Zofran, Protonix while in the ED.  The hospitalist service was consulted to admit for further evaluation and management.  Review of Systems: All systems reviewed and are negative except as documented in history of present illness above.   Past Medical History:  Diagnosis Date   Adenomatous polyps    Back pain    Barrett's esophagus    Coronary artery disease    Diabetes mellitus without complication (HCC)    Failure to thrive in adult    Gastroparesis    GERD (gastroesophageal reflux disease)    Hyperlipidemia    Hypertension    Lumbar disc disease    Lumbar spinal stenosis    Myocardial infarction [facility]) 1997   Thoracic myelopathy 2020    Past Surgical History:  Procedure Laterality Date   BACK SURGERY     microdiskectomy x2  BIOPSY  06/21/2018   Procedure: BIOPSY;  Surgeon: Wilford Corner, MD;  Location: [facility];  Service: Endoscopy;;   BIOPSY  02/06/2020   Procedure: BIOPSY;  Surgeon: Wilford Corner, MD;  Location: [facility];  Service: Endoscopy;;   COLONOSCOPY  07/12/2012   Procedure: COLONOSCOPY;  Surgeon: Lear Ng,  MD;  Location: [facility];  Service: Endoscopy;  Laterality: N/A;   COLONOSCOPY WITH PROPOFOL N/A 06/21/2018   Procedure: COLONOSCOPY WITH PROPOFOL;  Surgeon: Wilford Corner, MD;  Location: [facility];  Service: Endoscopy;  Laterality: N/A;   COLONOSCOPY WITH PROPOFOL N/A 02/06/2020   Procedure: COLONOSCOPY WITH PROPOFOL;  Surgeon: Wilford Corner, MD;  [facility]: [facility];  Service: Endoscopy;  Laterality: N/A;   ESOPHAGEAL MANOMETRY N/A 11/15/2017   Procedure: ESOPHAGEAL MANOMETRY (EM);  Surgeon: Wilford Corner, MD;  [facility]: [facility];  Service: Endoscopy;  Laterality: N/A;   ESOPHAGEAL MANOMETRY  01/29/2021   Procedure: ESOPHAGEAL MANOMETRY (EM);  Surgeon: Gatha Mayer, MD;  [facility]: [facility];  Service: Endoscopy;;   ESOPHAGOGASTRODUODENOSCOPY  07/12/2012   Procedure: ESOPHAGOGASTRODUODENOSCOPY (EGD);  Surgeon: Lear Ng, MD;  Location: [facility];  Service: Endoscopy;  Laterality: N/A;   ESOPHAGOGASTRODUODENOSCOPY (EGD) WITH PROPOFOL N/A 06/21/2018   Procedure: ESOPHAGOGASTRODUODENOSCOPY (EGD) WITH PROPOFOL;  Surgeon: Wilford Corner, MD;  [facility];  Service: Endoscopy;  Laterality: N/A;   ESOPHAGOGASTRODUODENOSCOPY (EGD) WITH PROPOFOL N/A 02/06/2020   Procedure: ESOPHAGOGASTRODUODENOSCOPY (EGD) WITH PROPOFOL;  Surgeon: Wilford Corner, MD;  [facility];  Service: Endoscopy;  Laterality: N/A;   FOREIGN BODY RETRIEVAL N/A 02/06/2020   Procedure: FOREIGN BODY RETRIEVAL;  Surgeon: Wilford Corner, MD;  [facility];  Service: Endoscopy;  Laterality: N/A;   INTRAVASCULAR ULTRASOUND/IVUS N/A 08/24/2018   Procedure: Intravascular Ultrasound/IVUS;  Surgeon: Belva Crome, MD;  Location: [place] [facility];  Service: Cardiovascular;  Laterality: N/A;   KNEE ARTHROSCOPY  1988   Left   LEFT HEART CATH AND CORONARY ANGIOGRAPHY N/A 08/24/2018   Procedure: LEFT HEART CATH AND CORONARY ANGIOGRAPHY;  Surgeon: Belva Crome, MD;   Location: [place] [facility];  Service: Cardiovascular;  Laterality: N/A;   POLYPECTOMY  06/21/2018   Procedure: POLYPECTOMY;  Surgeon: Wilford Corner, MD;  [facility];  Service: Endoscopy;;   POLYPECTOMY  02/06/2020   Procedure: POLYPECTOMY;  Surgeon: Wilford Corner, MD;  [facility];  Service: Endoscopy;;   SPINAL CORD STIMULATOR IMPLANT     SPINAL CORD STIMULATOR REMOVAL     SPINAL FUSION     x 3   THORACIC DISCECTOMY N/A 03/15/2019   Procedure: Laminectomy and Foraminotomy - Thoracic eleven -twelve;  Surgeon: Kary Kos, MD;  Location: [facility];  Service: Neurosurgery;  Laterality: N/A;    Social History:  reports that he quit smoking about 10 years ago. His smoking use included cigarettes. He has a 20.00 pack-year smoking history. He has never used smokeless tobacco. He reports that he does not currently use alcohol. He reports current drug use. Frequency: 14.00 times per week. Drug: Marijuana.  Allergies  Allergen Reactions   Humibid La [Guaifenesin] Other (See Comments)    hallucinations    Family History  Problem Relation Age of Onset   Breast cancer Mother    Lung cancer Father    Liver disease Brother    Colon cancer Neg Hx      Prior to Admission medications   Medication Sig Start Date End Date Taking? Authorizing Provider  aspirin EC 81 MG tablet Take 1 tablet (81 mg  total) by mouth daily. 10/18/18  Yes Belva Crome, MD  carboxymethylcellulose (REFRESH PLUS) 0.5 % SOLN Place 1 drop into both eyes 3 (three) times daily as needed (dry eyes).   Yes [provider]  empagliflozin (JARDIANCE) 10 MG TABS tablet Take 1 tablet (10 mg total) by mouth daily. 06/16/21  Yes   glimepiride (AMARYL) 4 MG tablet TAKE 2 TABLETS BY MOUTH ONCE A DAY 11/24/21  Yes   insulin degludec (TRESIBA FLEXTOUCH) 100 UNIT/ML FlexTouch Pen Inject 8 units into the skin every evening 03/06/22  Yes   lidocaine (LIDODERM) 5 % Apply 2 patches to affected area daily for up to  12 hours.  Remove for 12 hours & repeat as directed. Patient taking differently: Place 1-2 patches onto the skin every 12 (twelve) hours as needed (pain). 06/18/21  Yes   metFORMIN (GLUCOPHAGE-XR) 500 MG 24 hr tablet TAKE 1 TABLET BY MOUTH TWO TIMES DAILY 02/20/22  Yes   metoCLOPramide (REGLAN) 5 MG tablet TAKE 1 TABLET BY MOUTH 2 TIMES DAILY BEFORE MEALS AS NEEDED Patient taking differently: Take 5 mg by mouth 2 (two) times daily as needed for nausea or vomiting. 04/08/22  Yes   metoprolol succinate (TOPROL-XL) 25 MG 24 hr tablet Take 1 tablet (25 mg total) by mouth daily. 02/20/22  Yes Belva Crome, MD  mirtazapine (REMERON) 15 MG tablet Take 1 tablet (15 mg total) by mouth at bedtime. 06/25/21  Yes Gatha Mayer, MD  pantoprazole (PROTONIX) 40 MG tablet Take 1 tablet (40 mg total) by mouth 2 (two) times daily. 09/02/20  Yes   sertraline (ZOLOFT) 25 MG tablet Take 1 tablet by mouth daily. 02/09/22  Yes   sitaGLIPtin (JANUVIA) 100 MG tablet Take 1 tablet (100 mg total) by mouth daily. 06/16/21  Yes   Vibegron (GEMTESA) 75 MG TABS Take 1 tablet by mouth daily.   Yes [provider]  amoxicillin (AMOXIL) 500 MG capsule Take 1 capsule by mouth three times daily until gone. Patient not taking: Reported on 04/28/2022 03/17/22     clindamycin (CLEOCIN) 150 MG capsule Take all 4 capsules by mouth 1 hour prior to surgery Patient not taking: Reported on 04/28/2022 03/16/22     glucose blood (FREESTYLE LITE) test strip Use to check blood glucose 2 times daily 11/04/21   Kathyrn Lass, MD  HYDROcodone-acetaminophen (NORCO/VICODIN) 5-325 MG tablet Take 1 tablet by mouth every 6 hours if needed for pain. Patient not taking: Reported on 04/28/2022 03/17/22     ibuprofen (ADVIL,MOTRIN) 200 MG tablet Take 600-800 mg by mouth every 8 (eight) hours as needed for moderate pain.     [provider]  insulin degludec (TRESIBA FLEXTOUCH) 100 UNIT/ML FlexTouch Pen Inject 8 units subcutaneous every evening Patient  not taking: Reported on 04/28/2022 06/16/21     Insulin Pen Needle (BD PEN NEEDLE NANO 2ND GEN) 32G X 4 MM MISC Use as directed once a day 03/09/22     Insulin Pen Needle 32G X 4 MM MISC Use as directed daily 06/16/21     nitroGLYCERIN (NITROSTAT) 0.4 MG SL tablet Place 1 tablet (0.4 mg total) under the tongue every 5 (five) minutes as needed for chest pain. Patient not taking: Reported on 04/08/2021 10/18/18 04/08/21  Belva Crome, MD  ofloxacin (OCUFLOX) 0.3 % ophthalmic solution Instill 1 drop into right eye 4 times daily for 1 week Patient not taking: Reported on 04/28/2022 02/09/22     ofloxacin (OCUFLOX) 0.3 % ophthalmic solution Place 1  drop into right eye 4 times a day for 1 week Patient not taking: Reported on 04/28/2022 02/19/22     prednisoLONE acetate (PRED FORTE) 1 % ophthalmic suspension Instill 1 drop into right eye 4 times a day for 1 week, then 2  times a day for 1 week, then daily for 2 weeks then STOP. Patient not taking: Reported on 04/28/2022 01/28/22     prednisoLONE acetate (PRED FORTE) 1 % ophthalmic suspension Instill 1 drop into right eye 4 times a day for 1 week, then 2  times a day for 1 week, then daily for 2 weeks then STOP Patient not taking: Reported on 04/28/2022 02/09/22     prednisoLONE acetate (PRED FORTE) 1 % ophthalmic suspension Place 1 drop into right eye 4 times a day for 1 week, then 2 times a day for 1 week, then daily for 2 weeks then STOP Patient not taking: Reported on 04/28/2022 02/19/22     sodium fluoride (PREVIDENT 5000 PLUS) 1.1 % CREA dental cream Apply pea sized amount to toothbrush, brush thoroughly for 2 minutes preferably at bedtime (Don't eat, drink or rinse for 30 minutes) 12/30/21       Physical Exam: Vitals:   04/28/22 1942 04/28/22 2006 04/28/22 2015 04/28/22 2045  BP: (!) 182/88  (!) 151/96 (!) 147/78  Pulse: 89  89 83  Resp: '20  19 17  '$ Temp:  98.8 F (37.1 C)    TempSrc:  Oral    SpO2: 99%  99% 98%  Weight:      Height:       Constitutional:  Resting in bed, NAD, calm, comfortable Eyes: EOMI, lids and conjunctivae normal ENMT: Mucous membranes are moist. Posterior pharynx clear of any exudate or lesions.Normal dentition.  Neck: normal, supple, no masses. Respiratory: clear to auscultation bilaterally, no wheezing, no crackles. Normal respiratory effort. No accessory muscle use.  Cardiovascular: Regular rate and rhythm, no murmurs / rubs / gallops. No extremity edema. Abdomen: no tenderness, no masses palpated. Musculoskeletal: no clubbing / cyanosis. No joint deformity upper and lower extremities. Good ROM, no contractures. Normal muscle tone.  Skin: no rashes, lesions, ulcers. No induration Neurologic: Sensation intact. Strength 5/5 in all 4.  Psychiatric: Alert and oriented x 3. Normal mood.   EKG: Personally reviewed. Initial EKG shows sinus rhythm, rate 74, PAC, no acute ischemic changes.  Repeat EKG shows sinus rhythm, PVCs.  Assessment/Plan Principal Problem:   Chest pain Active Problems:   CAD (coronary artery disease), native coronary artery   Insulin dependent type 2 diabetes mellitus ([place])   Hypertension associated with diabetes ([place])   Renal insufficiency   Gastroparesis due to DM ([place])   Peripheral artery disease (HCC)   GERD (gastroesophageal reflux disease)   Hyperlipidemia associated with type 2 diabetes mellitus ([place])   Justin Key is a 71 y.o. male with medical history significant for mod-severe multivessel CAD, PVD, insulin-dependent T2DM with gastroparesis, HTN, HLD, GERD with tortuous Barrett's esophagus, chronic back pain who is admitted for chest pain evaluation.  Assessment and Plan: * Chest pain Hx moderate-severe multivessel CAD Presenting with atypical chest pain.  Does have known diffuse CAD.  Pain not immediately improved with SL NTG but currently chest pain-free.  Mild troponin elevation 29 >> 46 on repeat.  EKGs with PACs and PVCs, no significant acute ischemic changes. -Started on IV  heparin, continue for now -Trend troponin -Continue aspirin 81 mg daily -Continue Toprol-XL 25 mg daily -Not on statin due to intolerance -  Discussed Zetia, he is interested but wants to discuss with his cardiologist, Dr. Daneen Schick -SL NTG as needed recurrent chest pain -EDP discussed with on-call cardiologist, medical management only for now  Insulin dependent type 2 diabetes mellitus ([place]) Hyperglycemic without evidence of DKA/HHS.  Continue home Tresiba 8 units every evening, add SSI.  Hold home Amaryl, metformin, Januvia for now.  Gastroparesis due to DM [facility]) Chronic issue, admits to dietary indiscretion.  Still having some nausea. -Start IV Reglan 10 mg every 8 hours -Advance diet as tolerated  Renal insufficiency Creatinine mildly elevated at 1.33, baseline appears around 1.0.  Start gentle IV fluid hydration tonight.  Hold Jardiance, metformin.  Repeat labs in AM.  Hypertension associated with diabetes (HCC) Continue Toprol-XL 25 mg daily.  Peripheral artery disease (HCC) CTA chest/abdomen/pelvis runoff study shows chronic PAD involving left superficial femoral artery, left posterior tibial artery, mild PAD throughout RLE.  Collaterals noted on imaging.  EDP discussed with on-call vascular surgery, no acute intervention recommended.  Follow-up as an outpatient. -Continue aspirin 81 mg daily -Not on statin due to intolerance  Hyperlipidemia associated with type 2 diabetes mellitus ([place]) Not on statin due to intolerance.  GERD (gastroesophageal reflux disease) CT with distal esophagitis appearance likely secondary to chronic GERD.  Continue Protonix.  DVT prophylaxis: Heparin drip Code Status: Full code, confirmed with patient on admission Family Communication: Discussed with patient, he has discussed with family Disposition Plan: From home and likely discharge to home pending clinical progress Consults called: EDP discussed with vascular surgery and cardiology Severity  of Illness: The appropriate patient status for this patient is OBSERVATION. Observation status is judged to be reasonable and necessary in order to provide the required intensity of service to ensure the patient's safety. The patient's presenting symptoms, physical exam findings, and initial radiographic and laboratory data in the context of their medical condition is felt to place them at decreased risk for further clinical deterioration. Furthermore, it is anticipated that the patient will be medically stable for discharge from the hospital within 2 midnights of admission.   Zada Finders MD [facility]  If 7PM-7AM, please contact night-coverage [email or web address]  04/28/2022, 10:15 PM

## 2022-04-28 NOTE — Assessment & Plan Note (Signed)
Chronic issue, admits to dietary indiscretion.  Still having some nausea. -Start IV Reglan 10 mg every 8 hours -Advance diet as tolerated

## 2022-04-28 NOTE — Assessment & Plan Note (Signed)
Continue Toprol-XL 25 mg daily.

## 2022-04-28 NOTE — Assessment & Plan Note (Signed)
CTA chest/abdomen/pelvis runoff study shows chronic PAD involving left superficial femoral artery, left posterior tibial artery, mild PAD throughout RLE.  Collaterals noted on imaging.  EDP discussed with on-call vascular surgery, no acute intervention recommended.  Follow-up as an outpatient. -Continue aspirin 81 mg daily -Not on statin due to intolerance

## 2022-04-28 NOTE — Assessment & Plan Note (Signed)
CT with distal esophagitis appearance likely secondary to chronic GERD.  Continue Protonix.

## 2022-04-28 NOTE — Assessment & Plan Note (Signed)
Hyperglycemic without evidence of DKA/HHS.  Continue home Tresiba 8 units every evening, add SSI.  Hold home Amaryl, metformin, Januvia for now.

## 2022-04-28 NOTE — Assessment & Plan Note (Signed)
Hx moderate-severe multivessel CAD Presenting with atypical chest pain.  Does have known diffuse CAD.  Pain not immediately improved with SL NTG but currently chest pain-free.  Mild troponin elevation 29 >> 46 on repeat.  EKGs with PACs and PVCs, no significant acute ischemic changes. -Started on IV heparin, continue for now -Trend troponin -Continue aspirin 81 mg daily -Continue Toprol-XL 25 mg daily -Not on statin due to intolerance -Discussed Zetia, he is interested but wants to discuss with his cardiologist, Dr. Daneen Schick -SL NTG as needed recurrent chest pain -EDP discussed with on-call cardiologist, medical management only for now

## 2022-04-29 ENCOUNTER — Telehealth: Payer: Self-pay | Admitting: Vascular Surgery

## 2022-04-29 ENCOUNTER — Other Ambulatory Visit (HOSPITAL_COMMUNITY): Payer: Self-pay

## 2022-04-29 DIAGNOSIS — I739 Peripheral vascular disease, unspecified: Secondary | ICD-10-CM | POA: Diagnosis not present

## 2022-04-29 DIAGNOSIS — R0789 Other chest pain: Secondary | ICD-10-CM

## 2022-04-29 LAB — CBC
HCT: 41.4 % (ref 39.0–52.0)
Hemoglobin: 14.1 g/dL (ref 13.0–17.0)
MCH: 29.1 pg (ref 26.0–34.0)
MCHC: 34.1 g/dL (ref 30.0–36.0)
MCV: 85.5 fL (ref 80.0–100.0)
Platelets: 234 10*3/uL (ref 150–400)
RBC: 4.84 MIL/uL (ref 4.22–5.81)
RDW: 14.1 % (ref 11.5–15.5)
WBC: 9.2 10*3/uL (ref 4.0–10.5)
nRBC: 0 % (ref 0.0–0.2)

## 2022-04-29 LAB — BASIC METABOLIC PANEL
Anion gap: 11 (ref 5–15)
BUN: 17 mg/dL (ref 8–23)
CO2: 20 mmol/L — ABNORMAL LOW (ref 22–32)
Calcium: 9.1 mg/dL (ref 8.9–10.3)
Chloride: 107 mmol/L (ref 98–111)
Creatinine, Ser: 1.25 mg/dL — ABNORMAL HIGH (ref 0.61–1.24)
GFR, Estimated: >60 mL/min (ref >60)
Glucose, Bld: 169 mg/dL — ABNORMAL HIGH (ref 70–99)
Potassium: 3.9 mmol/L (ref 3.5–5.1)
Sodium: 138 mmol/L (ref 135–145)

## 2022-04-29 LAB — GLUCOSE, CAPILLARY
Glucose-Capillary: 121 mg/dL — ABNORMAL HIGH (ref 70–99)
Glucose-Capillary: 187 mg/dL — ABNORMAL HIGH (ref 70–99)

## 2022-04-29 LAB — HEPARIN LEVEL (UNFRACTIONATED): Heparin Unfractionated: 0.37 IU/mL (ref 0.30–0.70)

## 2022-04-29 LAB — HIV ANTIBODY (ROUTINE TESTING W REFLEX): HIV Screen 4th Generation wRfx: NONREACTIVE

## 2022-04-29 MED ORDER — ONDANSETRON HCL 4 MG/2ML IJ SOLN
4.0000 mg | Freq: Once | INTRAMUSCULAR | Status: AC
  Administered 2022-04-29: 4 mg via INTRAVENOUS
  Filled 2022-04-29: qty 2

## 2022-04-29 MED ORDER — NITROGLYCERIN 0.4 MG SL SUBL
0.4000 mg | SUBLINGUAL_TABLET | SUBLINGUAL | 3 refills | Status: DC | PRN
Start: 2022-04-29 — End: 2023-09-27
  Filled 2022-04-29: qty 25, 7d supply, fill #0

## 2022-04-29 NOTE — Telephone Encounter (Signed)
-----   Message from Angelia Mould, MD sent at 04/28/2022  7:07 PM EDT ----- Regarding: office visit This patient needs a new patient visit in the next 2 to 3 weeks with chronic PAD.  Thank you CD

## 2022-04-29 NOTE — Discharge Summary (Signed)
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  He called EMS.  Was given 1 sublingual nitroglycerin that did not help, given aspirin and IV Zofran with improvement.  In the emergency room hemodynamically stable.  Troponin 29-46.  EKG nonischemic.  Chest x-ray essentially normal. CT angiogram of the chest abdomen pelvis along with CT angiogram of the aorta and bifemoral study was done. No evidence of dissection, he does have thickened distal esophagus. CTA with runoff showed no acute aortic dissection or aneurysm.  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  Case was discussed with cardiology and vascular surgery and admitted for observation.  Initially started on heparin infusion.  Chest pain with both typical and atypical features in a patient with known multivessel coronary artery disease. Remains chest pain-free since admission.  Troponins and EKG are nonischemic. Case was discussed with cardiology by ER physician.  Cardiology recommended conservative management.  Since he is chest pain-free now, he will go home with  outpatient follow-up.  He will continue aspirin and beta-blocker.  Was prescribed nitroglycerin. Also has significant history of gastroparesis, using Reglan as needed.  He will continue to use PPI twice daily.  Able to tolerate oral intake today.  Peripheral artery disease with blister left great toe: CT angiogram with chronic occlusion.  ER provider discussed case with Dr. Scot Dock, vascular surgery. They evaluated this as chronic findings and they will make an outpatient follow-up.  Did not recommend any inpatient intervention.   Other chronic medical issues remained stable.  Patient was given regular diet, heparin was discontinued.  He was mobilized in the hallway and monitored in the cardiac unit.  Asymptomatic since admission.  As per cardiology and vascular surgery recommendation he will continue outpatient follow-up.  Discharged with advice, added sublingual nitroglycerin on his regimen.    Discharge Diagnoses:  Principal Problem:   Chest pain Active Problems:   CAD (coronary artery disease), native coronary artery   Insulin dependent type 2 diabetes mellitus ([place])   Hypertension associated with diabetes ([place])   Renal insufficiency   Gastroparesis due to DM ([place])   Peripheral artery disease ([facility])   GERD (gastroesophageal reflux disease)   Hyperlipidemia associated with type 2 diabetes mellitus [facility])    Discharge Instructions  Discharge Instructions     Diet - low sodium heart healthy   Complete by: As directed    Diet Carb Modified   Complete by: As directed    Discharge wound care:   Complete by: As directed    Cleanse left great toe with NS and pat dry  Paint with betadine daily.   Increase activity slowly   Complete by: As directed       Allergies as of 04/29/2022       Reactions   Humibid  La [guaifenesin] Other (See Comments)   hallucinations        Medication List     STOP taking these medications    amoxicillin 500 MG capsule Commonly known as:  AMOXIL   clindamycin 150 MG capsule Commonly known as: Cleocin   HYDROcodone-acetaminophen 5-325 MG tablet Commonly known as: NORCO/VICODIN   ofloxacin 0.3 % ophthalmic solution Commonly known as: OCUFLOX       TAKE these medications    aspirin EC 81 MG tablet Take 1 tablet (81 mg total) by mouth daily.   carboxymethylcellulose 0.5 % Soln Commonly known as: REFRESH PLUS Place 1 drop into both eyes 3 (three) times daily as needed (dry eyes).   FREESTYLE LITE test strip Generic drug: glucose blood Use to check blood glucose 2 times daily   Gemtesa 75 MG Tabs Generic drug: Vibegron Take 1 tablet by mouth daily.   glimepiride 4 MG tablet Commonly known as: AMARYL TAKE 2 TABLETS BY MOUTH ONCE A DAY   ibuprofen 200 MG tablet Commonly known as: ADVIL Take 600-800 mg by mouth every 8 (eight) hours as needed for moderate pain.   Januvia 100 MG tablet Generic drug: sitaGLIPtin Take 1 tablet (100 mg total) by mouth daily.   Jardiance 10 MG Tabs tablet Generic drug: empagliflozin Take 1 tablet (10 mg total) by mouth daily.   lidocaine 5 % Commonly known as: LIDODERM Apply 2 patches to affected area daily for up to 12 hours.  Remove for 12 hours & repeat as directed. What changed:  how much to take when to take this reasons to take this   metFORMIN 500 MG 24 hr tablet Commonly known as: GLUCOPHAGE-XR TAKE 1 TABLET BY MOUTH TWO TIMES DAILY   metoCLOPramide 5 MG tablet Commonly known as: REGLAN TAKE 1 TABLET BY MOUTH 2 TIMES DAILY BEFORE MEALS AS NEEDED What changed:  how much to take how to take this when to take this reasons to take this   metoprolol succinate 25 MG 24 hr tablet Commonly known as: TOPROL-XL Take 1 tablet (25 mg total) by mouth daily.   mirtazapine 15 MG tablet Commonly known as: Remeron Take 1 tablet (15 mg total) by mouth at bedtime.   nitroGLYCERIN 0.4 MG SL tablet Commonly known as: NITROSTAT Place 1 tablet (0.4 mg total) under the  tongue every 5 (five) minutes as needed for chest pain.   pantoprazole 40 MG tablet Commonly known as: PROTONIX Take 1 tablet (40 mg total) by mouth 2 (two) times daily.   prednisoLONE acetate 1 % ophthalmic suspension Commonly known as: PRED FORTE Place 1 drop into right eye 4 times a day for 1 week, then 2 times a day for 1 week, then daily for 2 weeks then STOP What changed: Another medication with the same name was removed. Continue taking this medication, and follow the directions you see here.   sertraline 25 MG tablet Commonly known as: ZOLOFT Take 1 tablet by mouth daily.   Sodium Fluoride 5000 Plus 1.1 % Crea dental cream Generic drug: sodium fluoride Apply pea sized amount to toothbrush, brush thoroughly for 2 minutes preferably at bedtime (Don't eat, drink or rinse for 30 minutes)   Tyler Aas FlexTouch 100 UNIT/ML FlexTouch Pen Generic drug: insulin degludec Inject 8 units into the skin every evening   Unifine Pentips 32G X 4 MM Misc Generic drug: Insulin Pen Needle Use as directed daily   Unifine Pentips 32G X 4 MM Misc Generic drug: Insulin Pen Needle Use as  directed once a day               Discharge Care Instructions  (From admission, onward)           Start     Ordered   04/29/22 0000  Discharge wound care:       Comments: Cleanse left great toe with NS and pat dry  Paint with betadine daily.   04/29/22 1459            Allergies  Allergen Reactions   Humibid La [Guaifenesin] Other (See Comments)    hallucinations    Consultations: Cardiology and vascular surgery curbside by ER physician as documented in the chart.   Procedures/Studies: CT Angio Chest/Abd/Pel for Dissection W and/or Wo Contrast  Result Date: 04/28/2022 CLINICAL DATA:  Chest and back pain as well as possible lower extremity ischemia. Evaluate for acute dissection EXAM: CT ANGIOGRAPHY CHEST, ABDOMEN AND PELVIS AND RUNOFF TECHNIQUE: Multidetector CT imaging through the  chest, abdomen and pelvis was performed using the standard protocol during bolus administration of intravenous contrast. Multiplanar reconstructed images and MIPs were obtained and reviewed to evaluate the vascular anatomy. RADIATION DOSE REDUCTION: This exam was performed according to the departmental dose-optimization program which includes automated exposure control, adjustment of the mA and/or kV according to patient size and/or use of iterative reconstruction technique. CONTRAST:  194m OMNIPAQUE IOHEXOL 350 MG/ML SOLN COMPARISON:  Prior CT scan of the chest 04/14/2021; CT scan of the abdomen and pelvis 4101 FINDINGS: CTA CHEST FINDINGS Cardiovascular: 4 vessel arch anatomy. The left vertebral artery arises directly from the aorta. Mild scattered atherosclerotic plaque. No evidence of aneurysm or dissection. The heart is normal in size. Concentric hypertrophy of the left ventricular myocardium suggests systemic arterial hypertension. Atherosclerotic plaque present along the coronary arteries. The main pulmonary artery is normal in size. No pericardial effusion. Mediastinum/Nodes: Unremarkable CT appearance of the thyroid gland. No suspicious mediastinal or hilar adenopathy. No soft tissue mediastinal mass. Diffuse mild thickening of the distal esophagus measuring up to 7 mm. Lungs/Pleura: Several emphysematous blebs are present in the lung apices. Otherwise, the lungs are largely clear. No pleural effusion or pneumothorax. Musculoskeletal: No chest wall abnormality. No acute or significant osseous findings. Review of the MIP images confirms the above findings. CTA ABDOMEN AND PELVIS FINDINGS VASCULAR Aorta: Extensive heterogeneous atherosclerotic plaque throughout the abdominal aorta. The aorta is mildly ectatic but not aneurysmal measuring only up to 2.5 cm. No evidence of dissection. Celiac: Patent without evidence of aneurysm, dissection, vasculitis or significant stenosis. Lateral segmental branch of the  left hepatic artery is replaced to the left gastric artery. SMA: Patent without evidence of aneurysm, dissection, vasculitis or significant stenosis. Renals: Solitary renal arteries bilaterally. Heterogeneous atherosclerotic plaque results in mild to moderate stenosis bilaterally. No changes of fibromuscular dysplasia. IMA: Patent at the origin. There is a high-grade stenosis approximately 2 cm from the origin secondary to mixed calcified and fibrofatty atherosclerotic plaque. The artery remains patent. RIGHT Lower Extremity Inflow: Extensive heterogeneous atherosclerotic plaque throughout the common, internal and external iliac arteries. Moderate stenosis at the origin of the internal iliac artery. Probable mild to moderate stenosis of the proximal external iliac artery. The vessels remain patent. No evidence of aneurysm or dissection. Outflow: Common femoral artery is mildly disease. Profunda femoral branches are widely patent. Moderate stenosis at the origin of the superficial femoral artery. The superficial femoral artery is then diffusely diseased with multiple focal areas of calcified and fibrofatty atherosclerotic plaque. Focal  moderate to high-grade stenosis distally in the abductor canal. The artery remains patent. Focal high-grade stenosis of the P1 segment of the popliteal artery. The remainder of the popliteal artery is diseased but patent. Runoff: Proximal occlusion of the anterior tibial artery. The posterior tibial artery remains patent into the foot. LEFT Lower Extremity Inflow: Extensive heterogeneous atherosclerotic plaque without significant stenosis throughout the common iliac artery. Occlusion of the origin of the internal iliac artery. The external iliac artery is diffusely disease but there is no focal stenosis. Outflow: The common femoral artery is mildly disease but remains patent. Profunda femoral branches are widely patent. Complete occlusion of the SFA beginning shortly after the origin.  The occlusion extends through the entire SFA and the P1 and P2 segments of the popliteal artery. The popliteal artery then reconstitutes just below the patella. Runoff: Somewhat limited evaluation of the runoff arteries secondary to diffuse disease proximally. The posterior tibial artery appears patent to the ankle. The anterior tibial artery is diffusely occluded. The posterior tibial artery occludes proximally but then reconstitutes and remains patent to the ankle. Veins: No focal venous abnormality. Review of the MIP images confirms the above findings. NON-VASCULAR Hepatobiliary: No focal liver abnormality is seen. No gallstones, gallbladder wall thickening, or biliary dilatation. Pancreas: Unremarkable. No pancreatic ductal dilatation or surrounding inflammatory changes. Spleen: Normal in size without focal abnormality. Adrenals/Urinary Tract: Diffuse adreniform thickening of the glands bilaterally consistent with adrenal hyperplasia. Numerous circumscribed water attenuation simple cysts throughout both kidneys. No hydronephrosis, nephrolithiasis or enhancing renal mass. Stomach/Bowel: Colonic diverticular disease without CT evidence of active inflammation. No evidence of obstruction or focal bowel wall thickening. Normal appendix in the right lower quadrant. The terminal ileum is unremarkable. Lymphatic: No suspicious lymphadenopathy. Reproductive: Prostate is unremarkable. Other: No abdominal wall hernia or abnormality. No abdominopelvic ascites. Musculoskeletal: L1-L5 posterior lumbar interbody fusion with interbody grafts at each level. No evidence of immediate hardware complication. Focal degenerative disc disease present at T11-T12. Review of the MIP images confirms the above findings. IMPRESSION: VASCULAR 1. No evidence of acute aortic dissection or aneurysm. 2. 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 The popliteal artery then reconstitutes just inferior to the patella. 3. Left lower extremity runoff disease with single-vessel runoff to the ankle in the form of the peroneal artery. The posterior tibial artery is occluded proximally but then reconstitutes. 4. More mild peripheral arterial disease throughout the right lower extremity as detailed above. 5. Concentric hypertrophy of the left ventricular myocardium suggests systemic arterial hypertension. 6. Aortic and coronary artery atherosclerotic calcifications. NON VASCULAR 1. No acute abnormality within the chest, abdomen or pelvis. 2. Diffuse mild thickening of the distal esophagus measuring up to 7 mm. Differential considerations include esophagitis related to GERD, infectious/inflammatory esophagitis, and less likely malignancy. 3. Adrenal hyperplasia. 4. 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 5. 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 6. 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 Electronically Signed   By: Jacqulynn Cadet M.D.   On: 04/28/2022 15:49   CT Angio Aortobifemoral W and/or Wo Contrast  Result Date: 04/28/2022 CLINICAL DATA:  Chest and back pain as well as possible lower extremity ischemia. Evaluate for acute dissection EXAM: CT ANGIOGRAPHY CHEST, ABDOMEN AND PELVIS AND RUNOFF TECHNIQUE: Multidetector CT imaging through the chest, abdomen and pelvis was performed using  the standard protocol during bolus administration of intravenous contrast. Multiplanar reconstructed images and MIPs were obtained and reviewed to evaluate the vascular anatomy. RADIATION DOSE REDUCTION: This exam was performed according to the departmental dose-optimization program which includes automated exposure control, adjustment of the mA and/or kV according to patient size and/or use of iterative reconstruction technique. CONTRAST:  189m  OMNIPAQUE IOHEXOL 350 MG/ML SOLN COMPARISON:  Prior CT scan of the chest 04/14/2021; CT scan of the abdomen and pelvis 4101 FINDINGS: CTA CHEST FINDINGS Cardiovascular: 4 vessel arch anatomy. The left vertebral artery arises directly from the aorta. Mild scattered atherosclerotic plaque. No evidence of aneurysm or dissection. The heart is normal in size. Concentric hypertrophy of the left ventricular myocardium suggests systemic arterial hypertension. Atherosclerotic plaque present along the coronary arteries. The main pulmonary artery is normal in size. No pericardial effusion. Mediastinum/Nodes: Unremarkable CT appearance of the thyroid gland. No suspicious mediastinal or hilar adenopathy. No soft tissue mediastinal mass. Diffuse mild thickening of the distal esophagus measuring up to 7 mm. Lungs/Pleura: Several emphysematous blebs are present in the lung apices. Otherwise, the lungs are largely clear. No pleural effusion or pneumothorax. Musculoskeletal: No chest wall abnormality. No acute or significant osseous findings. Review of the MIP images confirms the above findings. CTA ABDOMEN AND PELVIS FINDINGS VASCULAR Aorta: Extensive heterogeneous atherosclerotic plaque throughout the abdominal aorta. The aorta is mildly ectatic but not aneurysmal measuring only up to 2.5 cm. No evidence of dissection. Celiac: Patent without evidence of aneurysm, dissection, vasculitis or significant stenosis. Lateral segmental branch of the left hepatic artery is replaced to the left gastric artery. SMA: Patent without evidence of aneurysm, dissection, vasculitis or significant stenosis. Renals: Solitary renal arteries bilaterally. Heterogeneous atherosclerotic plaque results in mild to moderate stenosis bilaterally. No changes of fibromuscular dysplasia. IMA: Patent at the origin. There is a high-grade stenosis approximately 2 cm from the origin secondary to mixed calcified and fibrofatty atherosclerotic plaque. The artery  remains patent. RIGHT Lower Extremity Inflow: Extensive heterogeneous atherosclerotic plaque throughout the common, internal and external iliac arteries. Moderate stenosis at the origin of the internal iliac artery. Probable mild to moderate stenosis of the proximal external iliac artery. The vessels remain patent. No evidence of aneurysm or dissection. Outflow: Common femoral artery is mildly disease. Profunda femoral branches are widely patent. Moderate stenosis at the origin of the superficial femoral artery. The superficial femoral artery is then diffusely diseased with multiple focal areas of calcified and fibrofatty atherosclerotic plaque. Focal moderate to high-grade stenosis distally in the abductor canal. The artery remains patent. Focal high-grade stenosis of the P1 segment of the popliteal artery. The remainder of the popliteal artery is diseased but patent. Runoff: Proximal occlusion of the anterior tibial artery. The posterior tibial artery remains patent into the foot. LEFT Lower Extremity Inflow: Extensive heterogeneous atherosclerotic plaque without significant stenosis throughout the common iliac artery. Occlusion of the origin of the internal iliac artery. The external iliac artery is diffusely disease but there is no focal stenosis. Outflow: The common femoral artery is mildly disease but remains patent. Profunda femoral branches are widely patent. Complete occlusion of the SFA beginning shortly after the origin. The occlusion extends through the entire SFA and the P1 and P2 segments of the popliteal artery. The popliteal artery then reconstitutes just below the patella. Runoff: Somewhat limited evaluation of the runoff arteries secondary to diffuse disease proximally. The posterior tibial artery appears patent to the ankle. The anterior tibial artery is diffusely occluded. The posterior tibial  artery occludes proximally but then reconstitutes and remains patent to the ankle. Veins: No focal  venous abnormality. Review of the MIP images confirms the above findings. NON-VASCULAR Hepatobiliary: No focal liver abnormality is seen. No gallstones, gallbladder wall thickening, or biliary dilatation. Pancreas: Unremarkable. No pancreatic ductal dilatation or surrounding inflammatory changes. Spleen: Normal in size without focal abnormality. Adrenals/Urinary Tract: Diffuse adreniform thickening of the glands bilaterally consistent with adrenal hyperplasia. Numerous circumscribed water attenuation simple cysts throughout both kidneys. No hydronephrosis, nephrolithiasis or enhancing renal mass. Stomach/Bowel: Colonic diverticular disease without CT evidence of active inflammation. No evidence of obstruction or focal bowel wall thickening. Normal appendix in the right lower quadrant. The terminal ileum is unremarkable. Lymphatic: No suspicious lymphadenopathy. Reproductive: Prostate is unremarkable. Other: No abdominal wall hernia or abnormality. No abdominopelvic ascites. Musculoskeletal: L1-L5 posterior lumbar interbody fusion with interbody grafts at each level. No evidence of immediate hardware complication. Focal degenerative disc disease present at T11-T12. Review of the MIP images confirms the above findings. IMPRESSION: VASCULAR 1. No evidence of acute aortic dissection or aneurysm. 2. 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 The popliteal artery then reconstitutes just inferior to the patella. 3. Left lower extremity runoff disease with single-vessel runoff to the ankle in the form of the peroneal artery. The posterior tibial artery is occluded proximally but then reconstitutes. 4. More mild peripheral arterial disease throughout the right lower extremity as detailed above. 5. Concentric hypertrophy of the left ventricular myocardium suggests systemic arterial hypertension. 6.  Aortic and coronary artery atherosclerotic calcifications. NON VASCULAR 1. No acute abnormality within the chest, abdomen or pelvis. 2. Diffuse mild thickening of the distal esophagus measuring up to 7 mm. Differential considerations include esophagitis related to GERD, infectious/inflammatory esophagitis, and less likely malignancy. 3. Adrenal hyperplasia. 4. 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 5. 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 6. 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 Electronically Signed   By: Jacqulynn Cadet M.D.   On: 04/28/2022 15:49   DG Chest 2 View  Result Date: 04/28/2022 CLINICAL DATA:  Left chest pain EXAM: CHEST - 2 VIEW COMPARISON:  Chest radiograph done on 12/30/2019 and CT chest done on 04/14/2021 FINDINGS: Cardiac size is within normal limits. There are no signs of pulmonary edema or focal pulmonary consolidation. There is no pleural effusion or pneumothorax. There is previous surgical fusion in lumbar spine. IMPRESSION: No active cardiopulmonary disease. Electronically Signed   By: Elmer Picker M.D.   On: 04/28/2022 12:13   (Echo, Carotid, EGD, Colonoscopy, ERCP)    Subjective: Examined patient in the morning rounds.  Denies any complaints.  He wanted to try food.  Tolerated well.  Walking in the hallway with physical therapist.  Lorelee Cover to go home.   Discharge Exam: Vitals:   04/29/22 0754 04/29/22 1125  BP: 131/73 (!) 167/86  Pulse: 76 68  Resp: 16 18  Temp: 98.2 F (36.8 C) 98.1 F (36.7 C)  SpO2: 99% 98%   Vitals:   04/29/22 0500 04/29/22 0504 04/29/22 0754 04/29/22 1125  BP:  (!) 156/72 131/73 (!) 167/86  Pulse:  67 76 68  Resp:  '16 16 18  '$ Temp:  98.2 F (36.8 C) 98.2 F (36.8 C) 98.1 F (36.7 C)  TempSrc:  Oral Oral Oral  SpO2:  98% 99% 98%  Weight: 84.9 kg     Height:        General: Pt is alert, awake, not in acute  distress Cardiovascular: RRR, S1/S2 +, no rubs, no gallops Respiratory: CTA bilaterally, no wheezing, no rhonchi Abdominal: Soft, NT, ND, bowel sounds + Extremities: no edema, no cyanosis Chronic ischemic changes with shiny skin left foot,poor pedal pulses, 2 x 2 cm bloody blister at the tip of left great toe.   The results of significant diagnostics from this hospitalization (including imaging, microbiology, ancillary and laboratory) are listed below for reference.     Microbiology: No results found for this or any previous visit (from the past 240 hour(s)).   Labs: BNP (last 3 results) No results for input(s): "BNP" in the last 8760 hours. Basic Metabolic Panel: Recent Labs  Lab 04/28/22 1209 04/29/22 0109  NA 138 138  K 4.1 3.9  CL 105 107  CO2 21* 20*  GLUCOSE 243* 169*  BUN 16 17  CREATININE 1.33* 1.25*  CALCIUM 9.5 9.1   Liver Function Tests: Recent Labs  Lab 04/28/22 1704  AST 20  ALT 14  ALKPHOS 75  BILITOT 0.8  PROT 7.3  ALBUMIN 3.4*   Recent Labs  Lab 04/28/22 1704  LIPASE 24   No results for input(s): "AMMONIA" in the last 168 hours. CBC: Recent Labs  Lab 04/28/22 1209 04/29/22 0109  WBC 11.2* 9.2  HGB 14.9 14.1  HCT 45.1 41.4  MCV 86.7 85.5  PLT 236 234   Cardiac Enzymes: No results for input(s): "CKTOTAL", "CKMB", "CKMBINDEX", "TROPONINI" in the last 168 hours. BNP: Invalid input(s): "POCBNP" CBG: Recent Labs  Lab 04/28/22 2303 04/29/22 0753 04/29/22 1124  GLUCAP 179* 121* 187*   D-Dimer No results for input(s): "DDIMER" in the last 72 hours. Hgb A1c Recent Labs    04/28/22 2258  HGBA1C 7.8*   Lipid Profile No results for input(s): "CHOL", "HDL", "LDLCALC", "TRIG", "CHOLHDL", "LDLDIRECT" in the last 72 hours. Thyroid function studies No results for input(s): "TSH", "T4TOTAL", "T3FREE", "THYROIDAB" in the last 72 hours.  Invalid input(s): "FREET3" Anemia work up No results for input(s): "VITAMINB12", "FOLATE",  "FERRITIN", "TIBC", "IRON", "RETICCTPCT" in the last 72 hours. Urinalysis    Component Value Date/Time   COLORURINE YELLOW 12/30/2019 1110   APPEARANCEUR CLEAR 12/30/2019 1110   LABSPEC 1.021 12/30/2019 1110   PHURINE 6.0 12/30/2019 1110   GLUCOSEU >=500 (A) 12/30/2019 1110   HGBUR NEGATIVE 12/30/2019 1110   BILIRUBINUR NEGATIVE 12/30/2019 1110   KETONESUR NEGATIVE 12/30/2019 1110   PROTEINUR 100 (A) 12/30/2019 1110   NITRITE NEGATIVE 12/30/2019 1110   LEUKOCYTESUR NEGATIVE 12/30/2019 1110   Sepsis Labs Recent Labs  Lab 04/28/22 1209 04/29/22 0109  WBC 11.2* 9.2   Microbiology No results found for this or any previous visit (from the past 240 hour(s)).   Time coordinating discharge:  28 minutes  SIGNED:   Barb Merino, MD  [facility] 04/29/2022, 2:59 PM

## 2022-04-29 NOTE — Consult Note (Signed)
[place] Nurse Consult Note: Reason for Consult: Blood filled blister to left great toe.  Patients states he first noticed the area on Friday (5 days ago).  It was a blood filled blister at that time.  He is unsure how it started.  The blister remains intact and is firm and reabsorbing.  History of CAD Wound type: unclear etiology Pressure Injury POA: NA Measurement: 2 cm x 2 cm intact blood filled blister Wound [id number] Drainage (amount, consistency, odor) none  Periwound: intact Dressing procedure/placement/frequency: Cleanse left great toe with NS and pat dry  Paint with betadine daily.  Will not follow at this time.  Please re-consult if needed.  Domenic Moras MSN, RN, FNP-BC CWON Wound, Ostomy, Continence Nurse Pager [phone]

## 2022-04-29 NOTE — ED Notes (Addendum)
ED TO INPATIENT HANDOFF REPORT  ED Nurse Name and Phone #: Iona Coach Name/Age/Gender Justin Key 71 y.o. male Room/Bed: 004C/004C  Code Status   Code Status: Full Code  Home/SNF/Other Home Patient oriented to: self, place, time, and situation Is this baseline? Yes   Triage Complete: Triage complete  Chief Complaint Chest pain due to coronary artery disease [facility]) [I25.119]  Triage Note Pt arrives to ED from Home BIB [facility] due to Chest Pain. Per EMS pt began having Nausea this morning around 6am and Left Side Chest Pain around 10:30am. 1 nitro sublingual, Zofran IV and '324mg'$  Aspirin administered by EMS. Denies vomitting. Pain 6/10. Hx HTN and Diabetes. Pt A/O x4.  BP 180/90 HR 74 O2 99% RA CBG 299    Allergies Allergies  Allergen Reactions   Humibid La [Guaifenesin] Other (See Comments)    hallucinations    Level of Care/Admitting Diagnosis ED Disposition     ED Disposition  Admit   Condition  --   Comment  Hospital Area: [facility] [[id number]]  Level of Care: Telemetry Cardiac [[id number]]  May place patient in observation at [facility] or [place] if equivalent level of care is available:: No  Covid Evaluation: Asymptomatic - no recent exposure (last 10 days) testing not required  Diagnosis: Chest pain due to coronary artery disease [facility]) [[id number]]  Admitting Physician: Lenore Cordia [[id number]]  Attending Physician: Lenore Cordia [[id number]]          B Medical/Surgery History Past Medical History:  Diagnosis Date   Adenomatous polyps    Back pain    Barrett's esophagus    Coronary artery disease    Diabetes mellitus without complication (HCC)    Failure to thrive in adult    Gastroparesis    GERD (gastroesophageal reflux disease)    Hyperlipidemia    Hypertension    Lumbar disc disease    Lumbar spinal stenosis    Myocardial infarction [facility]) 1997   Thoracic myelopathy 2020   Past Surgical History:  Procedure Laterality  Date   BACK SURGERY     microdiskectomy x2   BIOPSY  06/21/2018   Procedure: BIOPSY;  Surgeon: Wilford Corner, MD;  Location: [facility];  Service: Endoscopy;;   BIOPSY  02/06/2020   Procedure: BIOPSY;  Surgeon: Wilford Corner, MD;  [facility]: [facility];  Service: Endoscopy;;   COLONOSCOPY  07/12/2012   Procedure: COLONOSCOPY;  Surgeon: Lear Ng, MD;  [facility];  Service: Endoscopy;  Laterality: N/A;   COLONOSCOPY WITH PROPOFOL N/A 06/21/2018   Procedure: COLONOSCOPY WITH PROPOFOL;  Surgeon: Wilford Corner, MD;  [facility];  Service: Endoscopy;  Laterality: N/A;   COLONOSCOPY WITH PROPOFOL N/A 02/06/2020   Procedure: COLONOSCOPY WITH PROPOFOL;  Surgeon: Wilford Corner, MD;  [facility];  Service: Endoscopy;  Laterality: N/A;   ESOPHAGEAL MANOMETRY N/A 11/15/2017   Procedure: ESOPHAGEAL MANOMETRY (EM);  Surgeon: Wilford Corner, MD;  [facility];  Service: Endoscopy;  Laterality: N/A;   ESOPHAGEAL MANOMETRY  01/29/2021   Procedure: ESOPHAGEAL MANOMETRY (EM);  Surgeon: Gatha Mayer, MD;  [facility];  Service: Endoscopy;;   ESOPHAGOGASTRODUODENOSCOPY  07/12/2012   Procedure: ESOPHAGOGASTRODUODENOSCOPY (EGD);  Surgeon: Lear Ng, MD;  [facility];  Service: Endoscopy;  Laterality: N/A;   ESOPHAGOGASTRODUODENOSCOPY (EGD) WITH PROPOFOL N/A 06/21/2018   Procedure: ESOPHAGOGASTRODUODENOSCOPY (EGD) WITH PROPOFOL;  Surgeon: Wilford Corner, MD;  [facility];  Service: Endoscopy;  Laterality: N/A;   ESOPHAGOGASTRODUODENOSCOPY (  EGD) WITH PROPOFOL N/A 02/06/2020   Procedure: ESOPHAGOGASTRODUODENOSCOPY (EGD) WITH PROPOFOL;  Surgeon: Wilford Corner, MD;  [facility];  Service: Endoscopy;  Laterality: N/A;   FOREIGN BODY RETRIEVAL N/A 02/06/2020   Procedure: FOREIGN BODY RETRIEVAL;  Surgeon: Wilford Corner, MD;  [facility];  Service: Endoscopy;  Laterality: N/A;    INTRAVASCULAR ULTRASOUND/IVUS N/A 08/24/2018   Procedure: Intravascular Ultrasound/IVUS;  Surgeon: Belva Crome, MD;  Location: [facility];  Service: Cardiovascular;  Laterality: N/A;   KNEE ARTHROSCOPY  1988   Left   LEFT HEART CATH AND CORONARY ANGIOGRAPHY N/A 08/24/2018   Procedure: LEFT HEART CATH AND CORONARY ANGIOGRAPHY;  Surgeon: Belva Crome, MD;  Location: [facility];  Service: Cardiovascular;  Laterality: N/A;   POLYPECTOMY  06/21/2018   Procedure: POLYPECTOMY;  Surgeon: Wilford Corner, MD;  [facility];  Service: Endoscopy;;   POLYPECTOMY  02/06/2020   Procedure: POLYPECTOMY;  Surgeon: Wilford Corner, MD;  [facility];  Service: Endoscopy;;   SPINAL CORD STIMULATOR IMPLANT     SPINAL CORD STIMULATOR REMOVAL     SPINAL FUSION     x 3   THORACIC DISCECTOMY N/A 03/15/2019   Procedure: Laminectomy and Foraminotomy - Thoracic eleven -twelve;  Surgeon: Kary Kos, MD;  Location: [place];  Service: Neurosurgery;  Laterality: N/A;     A IV Location/Drains/Wounds Patient Lines/Drains/Airways Status     Active Line/Drains/Airways     Name Placement date Placement time Site Days   Peripheral IV 04/28/22 18 G Left Antecubital 04/28/22  --  Antecubital  1   Peripheral IV 04/28/22 20 G Anterior;Right Forearm 04/28/22  1954  Forearm  1   Incision (Closed) 03/15/19 Back Other (Comment) 03/15/19  0830  -- 1141            Intake/Output Last 24 hours No intake or output data in the 24 hours ending 04/29/22 0250  Labs/Imaging Results for orders placed or performed during the hospital encounter of 04/28/22 (from the past 48 hour(s))  Basic metabolic panel     Status: Abnormal   Collection Time: 04/28/22 12:09 PM  Result Value Ref Range   Sodium 138 135 - 145 mmol/L   Potassium 4.1 3.5 - 5.1 mmol/L   Chloride 105 98 - 111 mmol/L   CO2 21 (L) 22 - 32 mmol/L   Glucose, Bld 243 (H) 70 - 99 mg/dL    Comment: Glucose reference range applies  only to samples taken after fasting for at least 8 hours.   BUN 16 8 - 23 mg/dL   Creatinine, Ser 1.33 (H) 0.61 - 1.24 mg/dL   Calcium 9.5 8.9 - 10.3 mg/dL   GFR, Estimated 58 (L) >60 mL/min    Comment: (NOTE) Calculated using the CKD-EPI Creatinine Equation (2021)    Anion gap 12 5 - 15    Comment: Performed at [facility] [address], [place], [place] [address]  CBC     Status: Abnormal   Collection Time: 04/28/22 12:09 PM  Result Value Ref Range   WBC 11.2 (H) 4.0 - 10.5 K/uL   RBC 5.20 4.22 - 5.81 MIL/uL   Hemoglobin 14.9 13.0 - 17.0 g/dL   HCT 45.1 39.0 - 52.0 %   MCV 86.7 80.0 - 100.0 fL   MCH 28.7 26.0 - 34.0 pg   MCHC 33.0 30.0 - 36.0 g/dL   RDW 14.0 11.5 - 15.5 %   Platelets 236 150 - 400 K/uL  nRBC 0.0 0.0 - 0.2 %    Comment: Performed at [facility], [place] [address], [place], [place] [address]  Troponin I (High Sensitivity)     Status: Abnormal   Collection Time: 04/28/22 12:09 PM  Result Value Ref Range   Troponin I (High Sensitivity) 29 (H) <18 ng/L    Comment: (NOTE) Elevated high sensitivity troponin I (hsTnI) values and significant  changes across serial measurements may suggest ACS but many other  chronic and acute conditions are known to elevate hsTnI results.  Refer to the "Links" section for chest pain algorithms and additional  guidance. Performed at [facility], [place] [address], [place], [place] [address]   Hepatic function panel     Status: Abnormal   Collection Time: 04/28/22  5:04 PM  Result Value Ref Range   Total Protein 7.3 6.5 - 8.1 g/dL   Albumin 3.4 (L) 3.5 - 5.0 g/dL   AST 20 15 - 41 U/L   ALT 14 0 - 44 U/L   Alkaline Phosphatase 75 38 - 126 U/L   Total Bilirubin 0.8 0.3 - 1.2 mg/dL   Bilirubin, Direct 0.2 0.0 - 0.2 mg/dL   Indirect Bilirubin 0.6 0.3 - 0.9 mg/dL    Comment: Performed at [facility] [address] [place], [place] [address]  Lipase, blood     Status: None   Collection Time: 04/28/22   5:04 PM  Result Value Ref Range   Lipase 24 11 - 51 U/L    Comment: Performed at [facility] [address], [place], [place] [address]  Troponin I (High Sensitivity)     Status: Abnormal   Collection Time: 04/28/22  5:04 PM  Result Value Ref Range   Troponin I (High Sensitivity) 46 (H) <18 ng/L    Comment: (NOTE) Elevated high sensitivity troponin I (hsTnI) values and significant  changes across serial measurements may suggest ACS but many other  chronic and acute conditions are known to elevate hsTnI results.  Refer to the "Links" section for chest pain algorithms and additional  guidance. Performed at [facility], [place] [address], [place], [place] [address]   Protime-INR     Status: None   Collection Time: 04/28/22  7:54 PM  Result Value Ref Range   Prothrombin Time 13.1 11.4 - 15.2 seconds   INR 1.0 0.8 - 1.2    Comment: (NOTE) INR goal varies based on device and disease states. Performed at [facility], [place] [address] [place], [place] [address]   Hemoglobin A1c     Status: Abnormal   Collection Time: 04/28/22 10:58 PM  Result Value Ref Range   Hgb A1c MFr Bld 7.8 (H) 4.8 - 5.6 %    Comment: (NOTE) Pre diabetes:          5.7%-6.4%  Diabetes:              >6.4%  Glycemic control for   <7.0% adults with diabetes    Mean Plasma Glucose 177.16 mg/dL    Comment: Performed at [facility], [place] [address] [place], [place] [address]  Troponin I (High Sensitivity)     Status: Abnormal   Collection Time: 04/28/22 10:58 PM  Result Value Ref Range   Troponin I (High Sensitivity) 61 (H) <18 ng/L    Comment: (NOTE) Elevated high sensitivity troponin I (hsTnI) values and significant  changes across serial measurements may suggest ACS but many other  chronic and acute conditions are known to elevate hsTnI results.  Refer to the "Links" section for chest pain algorithms and additional  guidance. Performed at [facility], [place] [address],  [place], [place] [address]   CBG monitoring, ED     Status: Abnormal   Collection Time: 04/28/22 11:03 PM  Result Value Ref Range   Glucose-Capillary 179 (H) 70 - 99 mg/dL    Comment: Glucose reference range applies only to samples taken after fasting for at least 8 hours.  Heparin level (unfractionated)     Status: None   Collection Time: 04/29/22  1:09 AM  Result Value Ref Range   Heparin Unfractionated 0.37 0.30 - 0.70 IU/mL    Comment: (NOTE) The clinical reportable range upper limit is being lowered to >1.10 to align with the FDA approved guidance for the current laboratory assay.  If heparin results are below expected values, and patient dosage has  been confirmed, suggest follow up testing of antithrombin III levels. Performed at [facility], [place] [address], [place], [place] [address]   HIV Antibody (routine testing w rflx)     Status: None   Collection Time: 04/29/22  1:09 AM  Result Value Ref Range   HIV Screen 4th Generation wRfx Non Reactive Non Reactive    Comment: Performed at [facility], [place] [address] [place], [place] [address]  Basic metabolic panel     Status: Abnormal   Collection Time: 04/29/22  1:09 AM  Result Value Ref Range   Sodium 138 135 - 145 mmol/L   Potassium 3.9 3.5 - 5.1 mmol/L   Chloride 107 98 - 111 mmol/L   CO2 20 (L) 22 - 32 mmol/L   Glucose, Bld 169 (H) 70 - 99 mg/dL    Comment: Glucose reference range applies only to samples taken after fasting for at least 8 hours.   BUN 17 8 - 23 mg/dL   Creatinine, Ser 1.25 (H) 0.61 - 1.24 mg/dL   Calcium 9.1 8.9 - 10.3 mg/dL   GFR, Estimated >60 >60 mL/min    Comment: (NOTE) Calculated using the CKD-EPI Creatinine Equation (2021)    Anion gap 11 5 - 15    Comment: Performed at [facility] [address] [place] [address]  CBC     Status: None   Collection Time: 04/29/22  1:09 AM  Result Value Ref Range   WBC 9.2 4.0 - 10.5 K/uL   RBC 4.84 4.22 - 5.81 MIL/uL    Hemoglobin 14.1 13.0 - 17.0 g/dL   HCT 41.4 39.0 - 52.0 %   MCV 85.5 80.0 - 100.0 fL   MCH 29.1 26.0 - 34.0 pg   MCHC 34.1 30.0 - 36.0 g/dL   RDW 14.1 11.5 - 15.5 %   Platelets 234 150 - 400 K/uL   nRBC 0.0 0.0 - 0.2 %    Comment: Performed at [facility], [place] [address] [place], [place] [address]   CT Angio Chest/Abd/Pel for Dissection W and/or Wo Contrast  Result Date: 04/28/2022 CLINICAL DATA:  Chest and back pain as well as possible lower extremity ischemia. Evaluate for acute dissection EXAM: CT ANGIOGRAPHY CHEST, ABDOMEN AND PELVIS AND RUNOFF TECHNIQUE: Multidetector CT imaging through the chest, abdomen and pelvis was performed using the standard protocol during bolus administration of intravenous contrast. Multiplanar reconstructed images and MIPs were obtained and reviewed to evaluate the vascular anatomy. RADIATION DOSE REDUCTION: This exam was performed  according to the departmental dose-optimization program which includes automated exposure control, adjustment of the mA and/or kV according to patient size and/or use of iterative reconstruction technique. CONTRAST:  171m OMNIPAQUE IOHEXOL 350 MG/ML SOLN COMPARISON:  Prior CT scan of the chest 04/14/2021; CT scan of the abdomen and pelvis 4101 FINDINGS: CTA CHEST FINDINGS Cardiovascular: 4 vessel arch anatomy. The left vertebral artery arises directly from the aorta. Mild scattered atherosclerotic plaque. No evidence of aneurysm or dissection. The heart is normal in size. Concentric hypertrophy of the left ventricular myocardium suggests systemic arterial hypertension. Atherosclerotic plaque present along the coronary arteries. The main pulmonary artery is normal in size. No pericardial effusion. Mediastinum/Nodes: Unremarkable CT appearance of the thyroid gland. No suspicious mediastinal or hilar adenopathy. No soft tissue mediastinal mass. Diffuse mild thickening of the distal esophagus measuring up to 7 mm. Lungs/Pleura: Several  emphysematous blebs are present in the lung apices. Otherwise, the lungs are largely clear. No pleural effusion or pneumothorax. Musculoskeletal: No chest wall abnormality. No acute or significant osseous findings. Review of the MIP images confirms the above findings. CTA ABDOMEN AND PELVIS FINDINGS VASCULAR Aorta: Extensive heterogeneous atherosclerotic plaque throughout the abdominal aorta. The aorta is mildly ectatic but not aneurysmal measuring only up to 2.5 cm. No evidence of dissection. Celiac: Patent without evidence of aneurysm, dissection, vasculitis or significant stenosis. Lateral segmental branch of the left hepatic artery is replaced to the left gastric artery. SMA: Patent without evidence of aneurysm, dissection, vasculitis or significant stenosis. Renals: Solitary renal arteries bilaterally. Heterogeneous atherosclerotic plaque results in mild to moderate stenosis bilaterally. No changes of fibromuscular dysplasia. IMA: Patent at the origin. There is a high-grade stenosis approximately 2 cm from the origin secondary to mixed calcified and fibrofatty atherosclerotic plaque. The artery remains patent. RIGHT Lower Extremity Inflow: Extensive heterogeneous atherosclerotic plaque throughout the common, internal and external iliac arteries. Moderate stenosis at the origin of the internal iliac artery. Probable mild to moderate stenosis of the proximal external iliac artery. The vessels remain patent. No evidence of aneurysm or dissection. Outflow: Common femoral artery is mildly disease. Profunda femoral branches are widely patent. Moderate stenosis at the origin of the superficial femoral artery. The superficial femoral artery is then diffusely diseased with multiple focal areas of calcified and fibrofatty atherosclerotic plaque. Focal moderate to high-grade stenosis distally in the abductor canal. The artery remains patent. Focal high-grade stenosis of the P1 segment of the popliteal artery. The  remainder of the popliteal artery is diseased but patent. Runoff: Proximal occlusion of the anterior tibial artery. The posterior tibial artery remains patent into the foot. LEFT Lower Extremity Inflow: Extensive heterogeneous atherosclerotic plaque without significant stenosis throughout the common iliac artery. Occlusion of the origin of the internal iliac artery. The external iliac artery is diffusely disease but there is no focal stenosis. Outflow: The common femoral artery is mildly disease but remains patent. Profunda femoral branches are widely patent. Complete occlusion of the SFA beginning shortly after the origin. The occlusion extends through the entire SFA and the P1 and P2 segments of the popliteal artery. The popliteal artery then reconstitutes just below the patella. Runoff: Somewhat limited evaluation of the runoff arteries secondary to diffuse disease proximally. The posterior tibial artery appears patent to the ankle. The anterior tibial artery is diffusely occluded. The posterior tibial artery occludes proximally but then reconstitutes and remains patent to the ankle. Veins: No focal venous abnormality. Review of the MIP images confirms the above findings. NON-VASCULAR Hepatobiliary: No focal  liver abnormality is seen. No gallstones, gallbladder wall thickening, or biliary dilatation. Pancreas: Unremarkable. No pancreatic ductal dilatation or surrounding inflammatory changes. Spleen: Normal in size without focal abnormality. Adrenals/Urinary Tract: Diffuse adreniform thickening of the glands bilaterally consistent with adrenal hyperplasia. Numerous circumscribed water attenuation simple cysts throughout both kidneys. No hydronephrosis, nephrolithiasis or enhancing renal mass. Stomach/Bowel: Colonic diverticular disease without CT evidence of active inflammation. No evidence of obstruction or focal bowel wall thickening. Normal appendix in the right lower quadrant. The terminal ileum is  unremarkable. Lymphatic: No suspicious lymphadenopathy. Reproductive: Prostate is unremarkable. Other: No abdominal wall hernia or abnormality. No abdominopelvic ascites. Musculoskeletal: L1-L5 posterior lumbar interbody fusion with interbody grafts at each level. No evidence of immediate hardware complication. Focal degenerative disc disease present at T11-T12. Review of the MIP images confirms the above findings. IMPRESSION: VASCULAR 1. No evidence of acute aortic dissection or aneurysm. 2. 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 The popliteal artery then reconstitutes just inferior to the patella. 3. Left lower extremity runoff disease with single-vessel runoff to the ankle in the form of the peroneal artery. The posterior tibial artery is occluded proximally but then reconstitutes. 4. More mild peripheral arterial disease throughout the right lower extremity as detailed above. 5. Concentric hypertrophy of the left ventricular myocardium suggests systemic arterial hypertension. 6. Aortic and coronary artery atherosclerotic calcifications. NON VASCULAR 1. No acute abnormality within the chest, abdomen or pelvis. 2. Diffuse mild thickening of the distal esophagus measuring up to 7 mm. Differential considerations include esophagitis related to GERD, infectious/inflammatory esophagitis, and less likely malignancy. 3. Adrenal hyperplasia. 4. 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 5. 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 6. 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 Electronically Signed   By: Jacqulynn Cadet M.D.   On: 04/28/2022 15:49   CT Angio Aortobifemoral W and/or Wo Contrast  Result Date: 04/28/2022 CLINICAL DATA:  Chest and back pain as well as possible lower extremity  ischemia. Evaluate for acute dissection EXAM: CT ANGIOGRAPHY CHEST, ABDOMEN AND PELVIS AND RUNOFF TECHNIQUE: Multidetector CT imaging through the chest, abdomen and pelvis was performed using the standard protocol during bolus administration of intravenous contrast. Multiplanar reconstructed images and MIPs were obtained and reviewed to evaluate the vascular anatomy. RADIATION DOSE REDUCTION: This exam was performed according to the departmental dose-optimization program which includes automated exposure control, adjustment of the mA and/or kV according to patient size and/or use of iterative reconstruction technique. CONTRAST:  167m OMNIPAQUE IOHEXOL 350 MG/ML SOLN COMPARISON:  Prior CT scan of the chest 04/14/2021; CT scan of the abdomen and pelvis 4101 FINDINGS: CTA CHEST FINDINGS Cardiovascular: 4 vessel arch anatomy. The left vertebral artery arises directly from the aorta. Mild scattered atherosclerotic plaque. No evidence of aneurysm or dissection. The heart is normal in size. Concentric hypertrophy of the left ventricular myocardium suggests systemic arterial hypertension. Atherosclerotic plaque present along the coronary arteries. The main pulmonary artery is normal in size. No pericardial effusion. Mediastinum/Nodes: Unremarkable CT appearance of the thyroid gland. No suspicious mediastinal or hilar adenopathy. No soft tissue mediastinal mass. Diffuse mild thickening of the distal esophagus measuring up to 7 mm. Lungs/Pleura: Several emphysematous blebs are present in the lung apices. Otherwise, the lungs are largely clear. No pleural effusion or pneumothorax. Musculoskeletal: No chest wall abnormality. No acute  or significant osseous findings. Review of the MIP images confirms the above findings. CTA ABDOMEN AND PELVIS FINDINGS VASCULAR Aorta: Extensive heterogeneous atherosclerotic plaque throughout the abdominal aorta. The aorta is mildly ectatic but not aneurysmal measuring only up to 2.5 cm. No  evidence of dissection. Celiac: Patent without evidence of aneurysm, dissection, vasculitis or significant stenosis. Lateral segmental branch of the left hepatic artery is replaced to the left gastric artery. SMA: Patent without evidence of aneurysm, dissection, vasculitis or significant stenosis. Renals: Solitary renal arteries bilaterally. Heterogeneous atherosclerotic plaque results in mild to moderate stenosis bilaterally. No changes of fibromuscular dysplasia. IMA: Patent at the origin. There is a high-grade stenosis approximately 2 cm from the origin secondary to mixed calcified and fibrofatty atherosclerotic plaque. The artery remains patent. RIGHT Lower Extremity Inflow: Extensive heterogeneous atherosclerotic plaque throughout the common, internal and external iliac arteries. Moderate stenosis at the origin of the internal iliac artery. Probable mild to moderate stenosis of the proximal external iliac artery. The vessels remain patent. No evidence of aneurysm or dissection. Outflow: Common femoral artery is mildly disease. Profunda femoral branches are widely patent. Moderate stenosis at the origin of the superficial femoral artery. The superficial femoral artery is then diffusely diseased with multiple focal areas of calcified and fibrofatty atherosclerotic plaque. Focal moderate to high-grade stenosis distally in the abductor canal. The artery remains patent. Focal high-grade stenosis of the P1 segment of the popliteal artery. The remainder of the popliteal artery is diseased but patent. Runoff: Proximal occlusion of the anterior tibial artery. The posterior tibial artery remains patent into the foot. LEFT Lower Extremity Inflow: Extensive heterogeneous atherosclerotic plaque without significant stenosis throughout the common iliac artery. Occlusion of the origin of the internal iliac artery. The external iliac artery is diffusely disease but there is no focal stenosis. Outflow: The common femoral artery  is mildly disease but remains patent. Profunda femoral branches are widely patent. Complete occlusion of the SFA beginning shortly after the origin. The occlusion extends through the entire SFA and the P1 and P2 segments of the popliteal artery. The popliteal artery then reconstitutes just below the patella. Runoff: Somewhat limited evaluation of the runoff arteries secondary to diffuse disease proximally. The posterior tibial artery appears patent to the ankle. The anterior tibial artery is diffusely occluded. The posterior tibial artery occludes proximally but then reconstitutes and remains patent to the ankle. Veins: No focal venous abnormality. Review of the MIP images confirms the above findings. NON-VASCULAR Hepatobiliary: No focal liver abnormality is seen. No gallstones, gallbladder wall thickening, or biliary dilatation. Pancreas: Unremarkable. No pancreatic ductal dilatation or surrounding inflammatory changes. Spleen: Normal in size without focal abnormality. Adrenals/Urinary Tract: Diffuse adreniform thickening of the glands bilaterally consistent with adrenal hyperplasia. Numerous circumscribed water attenuation simple cysts throughout both kidneys. No hydronephrosis, nephrolithiasis or enhancing renal mass. Stomach/Bowel: Colonic diverticular disease without CT evidence of active inflammation. No evidence of obstruction or focal bowel wall thickening. Normal appendix in the right lower quadrant. The terminal ileum is unremarkable. Lymphatic: No suspicious lymphadenopathy. Reproductive: Prostate is unremarkable. Other: No abdominal wall hernia or abnormality. No abdominopelvic ascites. Musculoskeletal: L1-L5 posterior lumbar interbody fusion with interbody grafts at each level. No evidence of immediate hardware complication. Focal degenerative disc disease present at T11-T12. Review of the MIP images confirms the above findings. IMPRESSION: VASCULAR 1. No evidence of acute aortic dissection or aneurysm.  2. 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 The popliteal artery then reconstitutes just inferior to the patella. 3. Left lower extremity runoff disease with single-vessel runoff to the ankle in the form of the peroneal artery. The posterior tibial artery is occluded proximally but then reconstitutes. 4. More mild peripheral arterial disease throughout the right lower extremity as detailed above. 5. Concentric hypertrophy of the left ventricular myocardium suggests systemic arterial hypertension. 6. Aortic and coronary artery atherosclerotic calcifications. NON VASCULAR 1. No acute abnormality within the chest, abdomen or pelvis. 2. Diffuse mild thickening of the distal esophagus measuring up to 7 mm. Differential considerations include esophagitis related to GERD, infectious/inflammatory esophagitis, and less likely malignancy. 3. Adrenal hyperplasia. 4. 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 5. 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 6. 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 Electronically Signed   By: Jacqulynn Cadet M.D.   On: 04/28/2022 15:49   DG Chest 2 View  Result Date: 04/28/2022 CLINICAL DATA:  Left chest pain EXAM: CHEST - 2 VIEW COMPARISON:  Chest radiograph done on 12/30/2019 and CT chest done on 04/14/2021 FINDINGS: Cardiac size is within normal limits. There are no signs of pulmonary edema or focal pulmonary consolidation. There is no pleural effusion or pneumothorax. There is previous surgical fusion in lumbar spine. IMPRESSION: No active cardiopulmonary disease. Electronically Signed   By: Elmer Picker M.D.   On: 04/28/2022 12:13    Pending Labs Unresulted Labs (From admission, onward)     Start     Ordered   04/30/22 0500  Heparin level  (unfractionated)  Daily at 5am,   R      04/28/22 1933   04/30/22 0500  CBC  Daily at 5am,   R      04/29/22 0157            Vitals/Pain Today's Vitals   04/28/22 2015 04/28/22 2045 04/29/22 0030 04/29/22 0051  BP: (!) 151/96 (!) 147/78 126/65   Pulse: 89 83 82   Resp: '19 17 15   '$ Temp:   98.1 F (36.7 C)   TempSrc:   Oral   SpO2: 99% 98% 97%   Weight:      Height:      PainSc:    3     Isolation Precautions No active isolations  Medications Medications  heparin ADULT infusion 100 units/mL (25000 units/242m) (1,100 Units/hr Intravenous New Bag/Given 04/28/22 1958)  sodium chloride flush (NS) 0.9 % injection 3 mL (3 mLs Intravenous Given 04/28/22 2258)  acetaminophen (TYLENOL) tablet 650 mg (650 mg Oral Given 04/28/22 2309)    Or  acetaminophen (TYLENOL) suppository 650 mg ( Rectal See Alternative 04/28/22 2309)  senna-docusate (Senokot-S) tablet 1 tablet (has no administration in time range)  metoCLOPramide (REGLAN) injection 10 mg (10 mg Intravenous Given 04/28/22 2256)  aspirin EC tablet 81 mg (has no administration in time range)  HYDROcodone-acetaminophen (NORCO/VICODIN) 5-325 MG per tablet 1-2 tablet (has no administration in time range)  lidocaine (LIDODERM) 5 % 1-2 patch (has no administration in time range)  metoprolol succinate (TOPROL-XL) 24 hr tablet 25 mg (has no administration in time range)  mirtazapine (REMERON) tablet 15 mg (15 mg Oral Given 04/28/22 2255)  pantoprazole (PROTONIX) EC tablet 40 mg (40 mg Oral Given 04/28/22 2255)  sertraline (ZOLOFT) tablet 25 mg (has no administration in time range)  insulin aspart (novoLOG) injection 0-9 Units (has no administration in time range)  insulin aspart (novoLOG) injection 0-5 Units ( Subcutaneous Not Given 04/28/22 2304)  0.9 %  sodium chloride infusion ( Intravenous New Bag/Given 04/28/22 2300)  insulin glargine-yfgn (SEMGLEE) injection 6 Units (6 Units Subcutaneous Given 04/28/22 2307)  nitroGLYCERIN (NITROSTAT) SL tablet  0.4 mg (has no administration in time range)  metoCLOPramide (REGLAN) injection 10 mg (10 mg Intravenous Given 04/28/22 1248)  morphine (PF) 4 MG/ML injection 4 mg (4 mg Intravenous Given 04/28/22 1248)  labetalol (NORMODYNE) injection 10 mg (10 mg Intravenous Given 04/28/22 1248)  iohexol (OMNIPAQUE) 350 MG/ML injection 100 mL (100 mLs Intravenous Contrast Given 04/28/22 1503)  ondansetron (ZOFRAN) injection 4 mg (4 mg Intravenous Given 04/28/22 1705)  pantoprazole (PROTONIX) injection 40 mg (40 mg Intravenous Given 04/28/22 1707)  alum & mag hydroxide-simeth (MAALOX/MYLANTA) 200-200-20 MG/5ML suspension 30 mL (30 mLs Oral Given 04/28/22 1708)  heparin bolus via infusion 4,000 Units (4,000 Units Intravenous Bolus from Bag 04/28/22 1958)    Mobility walks Low fall risk   Focused Assessments Cardiac Assessment Handoff:  Cardiac Rhythm: Normal sinus rhythm Lab Results  Component Value Date   CKTOTAL 90 12/18/2008   CKMB 1.5 12/18/2008   TROPONINI <0.01        NO INDICATION OF MYOCARDIAL INJURY. 12/18/2008   No results found for: "DDIMER" Does the Patient currently have chest pain? No.  4/10 back pain from previous back surgeries   R Recommendations: See Admitting Provider Note  Report given to:   Additional Notes: na

## 2022-04-29 NOTE — Progress Notes (Signed)
ANTICOAGULATION CONSULT NOTE -  Pharmacy Consult for Heparin Indication: chest pain/ACS Brief A/P: Heparin level within goal range Continue Heparin at current rate    Allergies  Allergen Reactions   Humibid La [Guaifenesin] Other (See Comments)    hallucinations    Patient Measurements: Height: '6\' 3"'$  (190.5 cm) Weight: 84.8 kg (187 lb) IBW/kg (Calculated) : 84.5 Heparin Dosing Weight: 84.8 kg  Vital Signs: Temp: 98.1 F (36.7 C) (08/09 0030) Temp Source: Oral (08/09 0030) BP: 126/65 (08/09 0030) Pulse Rate: 82 (08/09 0030)  Labs: Recent Labs    04/28/22 1209 04/28/22 1704 04/28/22 1954 04/28/22 2258 04/29/22 0109  HGB 14.9  --   --   --  14.1  HCT 45.1  --   --   --  41.4  PLT 236  --   --   --  234  LABPROT  --   --  13.1  --   --   INR  --   --  1.0  --   --   HEPARINUNFRC  --   --   --   --  0.37  CREATININE 1.33*  --   --   --  1.25*  TROPONINIHS 29* 46*  --  61*  --      Estimated Creatinine Clearance: 65.7 mL/min (A) (by C-G formula based on SCr of 1.25 mg/dL (H)).  Assessment: 71 y.o. male with chest pain for heparin   Goal of Therapy:  Heparin level 0.3-0.7 units/ml Monitor platelets by anticoagulation protocol: Yes   Plan:  Continue Heparin at current rate   Phillis Knack, PharmD, BCPS

## 2022-04-29 NOTE — Care Management (Signed)
  Transition of Care [facility]) Screening Note   Patient Details  Name: ADDIEL MCCARDLE Date of Birth: [birth year]   [facility]) CM/SW Contact:    Bethena Roys, RN Phone Number: 04/29/2022, 2:17 PM    Transition of Care Department [facility]) has reviewed the patient and no TOC needs have been identified at this time. We will continue to monitor patient advancement through interdisciplinary progression rounds. If new patient transition needs arise, please place a TOC consult.

## 2022-04-29 NOTE — Evaluation (Signed)
Physical Therapy Evaluation Patient Details Name: Justin Key MRN: [id number] DOB: 04-Jul-1951 Today's Date: 04/29/2022  History of Present Illness  71 y.o. male presents to [facility] hospital on 04/28/2022 with chest pain. PMH includes CAD, PVD, IDDM, HTN, HLD, GERD, chronic back pain.  Clinical Impression  Pt presents to PT at or near his functional baseline, mobilizing and ambulating at a modI level with use of rollator. Pt denies concerns about his mobility and is eager to discharge home. Pt has no further acute PT needs. Acute PT signing off.       Recommendations for follow up therapy are one component of a multi-disciplinary discharge planning process, led by the attending physician.  Recommendations may be updated based on patient status, additional functional criteria and insurance authorization.  Follow Up Recommendations No PT follow up      Assistance Recommended at Discharge None  Patient can return home with the following       Equipment Recommendations None recommended by PT  Recommendations for Other Services       Functional Status Assessment Patient has not had a recent decline in their functional status     Precautions / Restrictions Precautions Precautions: None Restrictions Weight Bearing Restrictions: No      Mobility  Bed Mobility Overal bed mobility: Modified Independent                  Transfers Overall transfer level: Modified independent Equipment used: Rollator (4 wheels)                    Ambulation/Gait Ambulation/Gait assistance: Modified independent (Device/Increase time) Gait Distance (Feet): 100 Feet Assistive device: Rollator (4 wheels) Gait Pattern/deviations: Step-through pattern Gait velocity: functional Gait velocity interpretation: 1.31 - 2.62 ft/sec, indicative of limited community ambulator   General Gait Details: pt with slowed step-through gait, early toe-off on LLE due to L great toe wound  Stairs             Wheelchair Mobility    Modified Rankin (Stroke Patients Only)       Balance Overall balance assessment: Needs assistance Sitting-balance support: No upper extremity supported, Feet supported Sitting balance-Leahy Scale: Good     Standing balance support: Single extremity supported, Reliant on assistive device for balance Standing balance-Leahy Scale: Poor                               Pertinent Vitals/Pain Pain Assessment Pain Assessment: 0-10 Pain Score: 4  Pain Location: back Pain Descriptors / Indicators: Aching Pain Intervention(s): Monitored during session    Home Living Family/patient expects to be discharged to:: Private residence Living Arrangements: Spouse/significant other Available Help at Discharge: Family;Available 24 hours/day Type of Home: House Home Access: Stairs to enter Entrance Stairs-Rails: Can reach both Entrance Stairs-Number of Steps: 4 Alternate Level Stairs-Number of Steps: stair lift Home Layout: Two level Home Equipment: Conservation officer, nature (2 wheels);Rollator (4 wheels);Cane - single point;Grab bars - tub/shower      Prior Function Prior Level of Function : Independent/Modified Independent             Mobility Comments: ambulates with use of rollator or RW       Hand Dominance        Extremity/Trunk Assessment   Upper Extremity Assessment Upper Extremity Assessment: Overall WFL for tasks assessed    Lower Extremity Assessment Lower Extremity Assessment: Overall WFL for tasks assessed  Cervical / Trunk Assessment Cervical / Trunk Assessment: Normal  Communication   Communication: No difficulties  Cognition Arousal/Alertness: Awake/alert Behavior During Therapy: WFL for tasks assessed/performed Overall Cognitive Status: Within Functional Limits for tasks assessed                                          General Comments General comments (skin integrity, edema, etc.): VSS on RA, no  reports of chest pain    Exercises     Assessment/Plan    PT Assessment Patient does not need any further PT services  PT Problem List         PT Treatment Interventions      PT Goals (Current goals can be found in the Care Plan section)       Frequency       Co-evaluation               AM-PAC PT "6 Clicks" Mobility  Outcome Measure Help needed turning from your back to your side while in a flat bed without using bedrails?: None Help needed moving from lying on your back to sitting on the side of a flat bed without using bedrails?: None Help needed moving to and from a bed to a chair (including a wheelchair)?: None Help needed standing up from a chair using your arms (e.g., wheelchair or bedside chair)?: None Help needed to walk in hospital room?: None Help needed climbing 3-5 steps with a railing? : None 6 Click Score: 24    End of Session   Activity Tolerance: Patient tolerated treatment well Patient left: in bed;with call bell/phone within reach Nurse Communication: Mobility status      Time: 1209-1225 PT Time Calculation (min) (ACUTE ONLY): 16 min   Charges:   PT Evaluation $PT Eval Low Complexity: [place], PT, DPT Acute Rehabilitation Office [phone]   Zenaida Niece 04/29/2022, 12:46 PM

## 2022-05-01 ENCOUNTER — Other Ambulatory Visit (HOSPITAL_COMMUNITY): Payer: Self-pay

## 2022-05-04 ENCOUNTER — Other Ambulatory Visit (HOSPITAL_COMMUNITY): Payer: Self-pay

## 2022-05-04 MED ORDER — PANTOPRAZOLE SODIUM 40 MG PO TBEC
40.0000 mg | DELAYED_RELEASE_TABLET | Freq: Two times a day (BID) | ORAL | 0 refills | Status: DC
  Filled 2022-05-04: qty 180, 90d supply, fill #0

## 2022-05-04 MED ORDER — METOCLOPRAMIDE HCL 5 MG PO TABS
ORAL_TABLET | ORAL | 0 refills | Status: DC
  Filled 2022-05-04: qty 60, 30d supply, fill #0

## 2022-05-05 ENCOUNTER — Other Ambulatory Visit (HOSPITAL_COMMUNITY): Payer: Self-pay

## 2022-05-06 NOTE — Telephone Encounter (Signed)
Appointment has been scheduled.

## 2022-05-11 ENCOUNTER — Other Ambulatory Visit (HOSPITAL_COMMUNITY): Payer: Self-pay

## 2022-05-11 ENCOUNTER — Ambulatory Visit (INDEPENDENT_AMBULATORY_CARE_PROVIDER_SITE_OTHER): Payer: HMO

## 2022-05-11 ENCOUNTER — Ambulatory Visit (INDEPENDENT_AMBULATORY_CARE_PROVIDER_SITE_OTHER): Payer: HMO | Admitting: Podiatry

## 2022-05-11 VITALS — BP 144/80 | HR 77 | Temp 98.2°F

## 2022-05-11 DIAGNOSIS — I739 Peripheral vascular disease, unspecified: Secondary | ICD-10-CM

## 2022-05-11 DIAGNOSIS — L97529 Non-pressure chronic ulcer of other part of left foot with unspecified severity: Secondary | ICD-10-CM | POA: Diagnosis not present

## 2022-05-11 DIAGNOSIS — I96 Gangrene, not elsewhere classified: Secondary | ICD-10-CM

## 2022-05-11 MED ORDER — DOXYCYCLINE HYCLATE 100 MG PO TABS
100.0000 mg | ORAL_TABLET | Freq: Two times a day (BID) | ORAL | 0 refills | Status: DC
  Filled 2022-05-11: qty 14, 7d supply, fill #0

## 2022-05-12 DIAGNOSIS — E1169 Type 2 diabetes mellitus with other specified complication: Secondary | ICD-10-CM | POA: Diagnosis not present

## 2022-05-12 DIAGNOSIS — F332 Major depressive disorder, recurrent severe without psychotic features: Secondary | ICD-10-CM | POA: Diagnosis not present

## 2022-05-12 DIAGNOSIS — I10 Essential (primary) hypertension: Secondary | ICD-10-CM | POA: Diagnosis not present

## 2022-05-12 DIAGNOSIS — N289 Disorder of kidney and ureter, unspecified: Secondary | ICD-10-CM | POA: Diagnosis not present

## 2022-05-12 DIAGNOSIS — I739 Peripheral vascular disease, unspecified: Secondary | ICD-10-CM | POA: Diagnosis not present

## 2022-05-12 DIAGNOSIS — R079 Chest pain, unspecified: Secondary | ICD-10-CM | POA: Diagnosis not present

## 2022-05-12 DIAGNOSIS — K3184 Gastroparesis: Secondary | ICD-10-CM | POA: Diagnosis not present

## 2022-05-14 ENCOUNTER — Other Ambulatory Visit: Payer: Self-pay

## 2022-05-14 ENCOUNTER — Encounter: Payer: Self-pay | Admitting: Vascular Surgery

## 2022-05-14 ENCOUNTER — Other Ambulatory Visit (HOSPITAL_COMMUNITY): Payer: Self-pay

## 2022-05-14 ENCOUNTER — Ambulatory Visit (INDEPENDENT_AMBULATORY_CARE_PROVIDER_SITE_OTHER): Payer: HMO | Admitting: Vascular Surgery

## 2022-05-14 VITALS — BP 137/86 | HR 60 | Temp 97.8°F | Resp 20 | Ht 75.0 in | Wt 195.3 lb

## 2022-05-14 DIAGNOSIS — I739 Peripheral vascular disease, unspecified: Secondary | ICD-10-CM

## 2022-05-14 NOTE — Progress Notes (Signed)
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  He has gangrene of the left great toe and this is clearly a limb threatening situation.  In addition he has diabetes and a history of coronary artery disease.  I have recommended that we proceed with arteriography to see what options we have for revascularization.  I have reviewed with the patient the indications for arteriography. In addition, I have reviewed the potential complications of arteriography including but not limited to: Bleeding, arterial injury, arterial thrombosis, dye action, renal insufficiency, or other unpredictable medical problems. I have explained to the patient that if we find disease amenable to angioplasty we could potentially address this at the same time. I have discussed the potential complications of angioplasty and stenting, including but not limited to: Bleeding, arterial thrombosis, arterial injury, dissection, or the need for surgical intervention.  If he is not a candidate for an endovascular approach and would require bypass, he would require preoperative cardiac evaluation by Dr. Pernell Dupre.  In addition, if he requires a bypass I will try to get his vein map when he is in the hospital tomorrow.  REASON FOR CONSULT:    Peripheral arterial disease.  HPI:   Justin Key is a 71 y.o. male who had presented to the emergency department on 04/28/2022 with chest pain.  The symptoms resolved with medical management.  During that admission the patient had a CT angio of the chest abdomen and pelvis with runoff which showed evidence of a chronic left superficial femoral artery occlusion with mild peripheral arterial disease on the right.  On my history he developed a wound on the distal aspect of the left great toe about 2 weeks ago and this has been progressing.  I do  not get any clear-cut history of claudication although I think his activity is fairly limited.  He denies any history of rest pain.  His risk factors for peripheral arterial disease include type 2 diabetes, hypertension, hypercholesterolemia, and a remote history of tobacco use.  The patient is on aspirin but is not on a statin due to intolerance.  This patient has a history of moderate to severe multivessel coronary artery disease, peripheral arterial disease, type 2 diabetes, hypertension, hyperlipidemia, and gastroparesis.  Past Medical History:  Diagnosis Date   Adenomatous polyps    Back pain    Barrett's esophagus    Coronary artery disease    Diabetes mellitus without complication (HCC)    Failure to thrive in adult    Gastroparesis    GERD (gastroesophageal reflux disease)    Hyperlipidemia    Hypertension    Lumbar disc disease    Lumbar spinal stenosis    Myocardial infarction [facility]) 1997   Thoracic myelopathy 2020    Family History  Problem Relation Age of Onset   Breast cancer Mother    Lung cancer Father    Liver disease Brother    Colon cancer Neg Hx     SOCIAL HISTORY: Social History   Tobacco Use   Smoking status: Former    Packs/day: 1.00    Years: 20.00    Total pack years: 20.00    Types: Cigarettes    Quit date: 04/09/2012    Years since quitting: 10.1   Smokeless tobacco: Never   Tobacco comments:    smoking THC for pain/appetite   Substance Use  Topics   Alcohol use: Not Currently    Allergies  Allergen Reactions   Humibid La [Guaifenesin] Other (See Comments)    hallucinations    Current Outpatient Medications  Medication Sig Dispense Refill   aspirin EC 81 MG tablet Take 1 tablet (81 mg total) by mouth daily. 90 tablet 3   carboxymethylcellulose (REFRESH PLUS) 0.5 % SOLN Place 1 drop into both eyes 3 (three) times daily as needed (dry eyes).     doxycycline (VIBRA-TABS) 100 MG tablet Take 1 tablet (100 mg total) by mouth 2 (two) times  daily. 14 tablet 0   empagliflozin (JARDIANCE) 10 MG TABS tablet Take 1 tablet (10 mg total) by mouth daily. 90 tablet 4   glimepiride (AMARYL) 4 MG tablet TAKE 2 TABLETS BY MOUTH ONCE A DAY 180 tablet 1   glucose blood (FREESTYLE LITE) test strip Use to check blood glucose 2 times daily 200 strip 4   ibuprofen (ADVIL,MOTRIN) 200 MG tablet Take 600-800 mg by mouth every 8 (eight) hours as needed for moderate pain.      insulin degludec (TRESIBA FLEXTOUCH) 100 UNIT/ML FlexTouch Pen Inject 8 units into the skin every evening 3 mL 0   Insulin Pen Needle (BD PEN NEEDLE NANO 2ND GEN) 32G X 4 MM MISC Use as directed once a day 100 each 6   Insulin Pen Needle 32G X 4 MM MISC Use as directed daily 50 each 6   lidocaine (LIDODERM) 5 % Apply 2 patches to affected area daily for up to 12 hours.  Remove for 12 hours & repeat as directed. (Patient taking differently: Place 1-2 patches onto the skin every 12 (twelve) hours as needed (pain).) 60 patch 11   metFORMIN (GLUCOPHAGE-XR) 500 MG 24 hr tablet TAKE 1 TABLET BY MOUTH TWO TIMES DAILY 60 tablet 6   metoCLOPramide (REGLAN) 5 MG tablet TAKE 1 TABLET BY MOUTH 2 TIMES DAILY BEFORE MEALS AS NEEDED 60 tablet 0   metoprolol succinate (TOPROL-XL) 25 MG 24 hr tablet Take 1 tablet (25 mg total) by mouth daily. 90 tablet 3   mirtazapine (REMERON) 15 MG tablet Take 1 tablet (15 mg total) by mouth at bedtime. 90 tablet 3   nitroGLYCERIN (NITROSTAT) 0.4 MG SL tablet Place 1 tablet (0.4 mg total) under the tongue every 5 (five) minutes as needed for chest pain. If no relief after 3 doses call 911 25 tablet 3   pantoprazole (PROTONIX) 40 MG tablet Take 1 tablet (40 mg total) by mouth 2 (two) times daily. 180 tablet 0   sertraline (ZOLOFT) 25 MG tablet Take 1 tablet by mouth daily. 90 tablet 0   sitaGLIPtin (JANUVIA) 100 MG tablet Take 1 tablet (100 mg total) by mouth daily. 90 tablet 4   traMADol (ULTRAM) 50 MG tablet Take by mouth.     Vibegron (GEMTESA) 75 MG TABS Take 1  tablet by mouth daily.     No current facility-administered medications for this visit.    REVIEW OF SYSTEMS:  '[X]'$  denotes positive finding, '[ ]'$  denotes negative finding Cardiac  Comments:  Chest pain or chest pressure:    Shortness of breath upon exertion:    Short of breath when lying flat:    Irregular heart rhythm:        Vascular    Pain in calf, thigh, or hip brought on by ambulation:    Pain in feet at night that wakes you up from your sleep:     Blood clot in your  veins:    Leg swelling:         Pulmonary    Oxygen at home:    Productive cough:     Wheezing:         Neurologic    Sudden weakness in arms or legs:     Sudden numbness in arms or legs:     Sudden onset of difficulty speaking or slurred speech:    Temporary loss of vision in one eye:     Problems with dizziness:         Gastrointestinal    Blood in stool:     Vomited blood:         Genitourinary    Burning when urinating:     Blood in urine:        Psychiatric    Major depression:         Hematologic    Bleeding problems:    Problems with blood clotting too easily:        Skin    Rashes or ulcers:        Constitutional    Fever or chills:    -  PHYSICAL EXAM:   Vitals:   05/14/22 1250  BP: 137/86  Pulse: 60  Resp: 20  Temp: 97.8 F (36.6 C)  SpO2: 97%  Weight: 195 lb 4.8 oz (88.6 kg)  Height: '6\' 3"'$  (1.905 m)   Body mass index is 24.41 kg/m. GENERAL: The patient is a well-nourished male, in no acute distress. The vital signs are documented above. CARDIAC: There is a regular rate and rhythm.  VASCULAR: I do not detect any carotid bruits. He has palpable femoral pulses.  I cannot palpate pedal pulses. He has my swelling of the left foot. PULMONARY: There is good air exchange bilaterally without wheezing or rales. ABDOMEN: Soft and non-tender with normal pitched bowel sounds.  I do not palpate an aneurysm. MUSCULOSKELETAL: There are no major deformities. NEUROLOGIC: No focal  weakness or paresthesias are detected. SKIN: He has dry gangrene of the tip of the left great toe as documented in the photograph below.     PSYCHIATRIC: The patient has a normal affect.  DATA:    ARTERIAL DUPLEX: I did review the arterial duplex scan that was done on 01/12/2022.  On the right side there was atherosclerosis noted in the common femoral, deep femoral, superficial femoral, and popliteal arteries.  On the left side there was total occlusion of the superficial femoral artery.  LABS: I reviewed the labs from 04/29/2022.  Creatinine was 1.25.  GFR was greater than 60.   CT ANGIO: I reviewed the CT angio from the patient's ER visit.  There was no evidence of acute aortic dissection or aneurysm.  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  There was reconstitution of the below-knee popliteal artery.  There was single-vessel runoff on the left via the peroneal artery.  There was mild infrainguinal arterial occlusive disease on the right.  Deitra Mayo [facility]

## 2022-05-14 NOTE — H&P (View-Only) (Signed)
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  He has gangrene of the left great toe and this is clearly a limb threatening situation.  In addition he has diabetes and a history of coronary artery disease.  I have recommended that we proceed with arteriography to see what options we have for revascularization.  I have reviewed with the patient the indications for arteriography. In addition, I have reviewed the potential complications of arteriography including but not limited to: Bleeding, arterial injury, arterial thrombosis, dye action, renal insufficiency, or other unpredictable medical problems. I have explained to the patient that if we find disease amenable to angioplasty we could potentially address this at the same time. I have discussed the potential complications of angioplasty and stenting, including but not limited to: Bleeding, arterial thrombosis, arterial injury, dissection, or the need for surgical intervention.  If he is not a candidate for an endovascular approach and would require bypass, he would require preoperative cardiac evaluation by Dr. Pernell Dupre.  In addition, if he requires a bypass I will try to get his vein map when he is in the hospital tomorrow.  REASON FOR CONSULT:    Peripheral arterial disease.  HPI:   Justin Key is a 71 y.o. male who had presented to the emergency department on 04/28/2022 with chest pain.  The symptoms resolved with medical management.  During that admission the patient had a CT angio of the chest abdomen and pelvis with runoff which showed evidence of a chronic left superficial femoral artery occlusion with mild peripheral arterial disease on the right.  On my history he developed a wound on the distal aspect of the left great toe about 2 weeks ago and this has been progressing.  I do  not get any clear-cut history of claudication although I think his activity is fairly limited.  He denies any history of rest pain.  His risk factors for peripheral arterial disease include type 2 diabetes, hypertension, hypercholesterolemia, and a remote history of tobacco use.  The patient is on aspirin but is not on a statin due to intolerance.  This patient has a history of moderate to severe multivessel coronary artery disease, peripheral arterial disease, type 2 diabetes, hypertension, hyperlipidemia, and gastroparesis.  Past Medical History:  Diagnosis Date   Adenomatous polyps    Back pain    Barrett's esophagus    Coronary artery disease    Diabetes mellitus without complication (HCC)    Failure to thrive in adult    Gastroparesis    GERD (gastroesophageal reflux disease)    Hyperlipidemia    Hypertension    Lumbar disc disease    Lumbar spinal stenosis    Myocardial infarction [facility]) 1997   Thoracic myelopathy 2020    Family History  Problem Relation Age of Onset   Breast cancer Mother    Lung cancer Father    Liver disease Brother    Colon cancer Neg Hx     SOCIAL HISTORY: Social History   Tobacco Use   Smoking status: Former    Packs/day: 1.00    Years: 20.00    Total pack years: 20.00    Types: Cigarettes    Quit date: 04/09/2012    Years since quitting: 10.1   Smokeless tobacco: Never   Tobacco comments:    smoking THC for pain/appetite   Substance Use  Topics   Alcohol use: Not Currently    Allergies  Allergen Reactions   Humibid La [Guaifenesin] Other (See Comments)    hallucinations    Current Outpatient Medications  Medication Sig Dispense Refill   aspirin EC 81 MG tablet Take 1 tablet (81 mg total) by mouth daily. 90 tablet 3   carboxymethylcellulose (REFRESH PLUS) 0.5 % SOLN Place 1 drop into both eyes 3 (three) times daily as needed (dry eyes).     doxycycline (VIBRA-TABS) 100 MG tablet Take 1 tablet (100 mg total) by mouth 2 (two) times  daily. 14 tablet 0   empagliflozin (JARDIANCE) 10 MG TABS tablet Take 1 tablet (10 mg total) by mouth daily. 90 tablet 4   glimepiride (AMARYL) 4 MG tablet TAKE 2 TABLETS BY MOUTH ONCE A DAY 180 tablet 1   glucose blood (FREESTYLE LITE) test strip Use to check blood glucose 2 times daily 200 strip 4   ibuprofen (ADVIL,MOTRIN) 200 MG tablet Take 600-800 mg by mouth every 8 (eight) hours as needed for moderate pain.      insulin degludec (TRESIBA FLEXTOUCH) 100 UNIT/ML FlexTouch Pen Inject 8 units into the skin every evening 3 mL 0   Insulin Pen Needle (BD PEN NEEDLE NANO 2ND GEN) 32G X 4 MM MISC Use as directed once a day 100 each 6   Insulin Pen Needle 32G X 4 MM MISC Use as directed daily 50 each 6   lidocaine (LIDODERM) 5 % Apply 2 patches to affected area daily for up to 12 hours.  Remove for 12 hours & repeat as directed. (Patient taking differently: Place 1-2 patches onto the skin every 12 (twelve) hours as needed (pain).) 60 patch 11   metFORMIN (GLUCOPHAGE-XR) 500 MG 24 hr tablet TAKE 1 TABLET BY MOUTH TWO TIMES DAILY 60 tablet 6   metoCLOPramide (REGLAN) 5 MG tablet TAKE 1 TABLET BY MOUTH 2 TIMES DAILY BEFORE MEALS AS NEEDED 60 tablet 0   metoprolol succinate (TOPROL-XL) 25 MG 24 hr tablet Take 1 tablet (25 mg total) by mouth daily. 90 tablet 3   mirtazapine (REMERON) 15 MG tablet Take 1 tablet (15 mg total) by mouth at bedtime. 90 tablet 3   nitroGLYCERIN (NITROSTAT) 0.4 MG SL tablet Place 1 tablet (0.4 mg total) under the tongue every 5 (five) minutes as needed for chest pain. If no relief after 3 doses call 911 25 tablet 3   pantoprazole (PROTONIX) 40 MG tablet Take 1 tablet (40 mg total) by mouth 2 (two) times daily. 180 tablet 0   sertraline (ZOLOFT) 25 MG tablet Take 1 tablet by mouth daily. 90 tablet 0   sitaGLIPtin (JANUVIA) 100 MG tablet Take 1 tablet (100 mg total) by mouth daily. 90 tablet 4   traMADol (ULTRAM) 50 MG tablet Take by mouth.     Vibegron (GEMTESA) 75 MG TABS Take 1  tablet by mouth daily.     No current facility-administered medications for this visit.    REVIEW OF SYSTEMS:  '[X]'$  denotes positive finding, '[ ]'$  denotes negative finding Cardiac  Comments:  Chest pain or chest pressure:    Shortness of breath upon exertion:    Short of breath when lying flat:    Irregular heart rhythm:        Vascular    Pain in calf, thigh, or hip brought on by ambulation:    Pain in feet at night that wakes you up from your sleep:     Blood clot in your  veins:    Leg swelling:         Pulmonary    Oxygen at home:    Productive cough:     Wheezing:         Neurologic    Sudden weakness in arms or legs:     Sudden numbness in arms or legs:     Sudden onset of difficulty speaking or slurred speech:    Temporary loss of vision in one eye:     Problems with dizziness:         Gastrointestinal    Blood in stool:     Vomited blood:         Genitourinary    Burning when urinating:     Blood in urine:        Psychiatric    Major depression:         Hematologic    Bleeding problems:    Problems with blood clotting too easily:        Skin    Rashes or ulcers:        Constitutional    Fever or chills:    -  PHYSICAL EXAM:   Vitals:   05/14/22 1250  BP: 137/86  Pulse: 60  Resp: 20  Temp: 97.8 F (36.6 C)  SpO2: 97%  Weight: 195 lb 4.8 oz (88.6 kg)  Height: '6\' 3"'$  (1.905 m)   Body mass index is 24.41 kg/m. GENERAL: The patient is a well-nourished male, in no acute distress. The vital signs are documented above. CARDIAC: There is a regular rate and rhythm.  VASCULAR: I do not detect any carotid bruits. He has palpable femoral pulses.  I cannot palpate pedal pulses. He has my swelling of the left foot. PULMONARY: There is good air exchange bilaterally without wheezing or rales. ABDOMEN: Soft and non-tender with normal pitched bowel sounds.  I do not palpate an aneurysm. MUSCULOSKELETAL: There are no major deformities. NEUROLOGIC: No focal  weakness or paresthesias are detected. SKIN: He has dry gangrene of the tip of the left great toe as documented in the photograph below.     PSYCHIATRIC: The patient has a normal affect.  DATA:    ARTERIAL DUPLEX: I did review the arterial duplex scan that was done on 01/12/2022.  On the right side there was atherosclerosis noted in the common femoral, deep femoral, superficial femoral, and popliteal arteries.  On the left side there was total occlusion of the superficial femoral artery.  LABS: I reviewed the labs from 04/29/2022.  Creatinine was 1.25.  GFR was greater than 60.   CT ANGIO: I reviewed the CT angio from the patient's ER visit.  There was no evidence of acute aortic dissection or aneurysm.  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  There was reconstitution of the below-knee popliteal artery.  There was single-vessel runoff on the left via the peroneal artery.  There was mild infrainguinal arterial occlusive disease on the right.  Deitra Mayo [facility]

## 2022-05-15 ENCOUNTER — Other Ambulatory Visit: Payer: Self-pay

## 2022-05-15 ENCOUNTER — Ambulatory Visit (HOSPITAL_COMMUNITY)
Admission: RE | Admit: 2022-05-15 | Discharge: 2022-05-15 | Disposition: A | Discharge status: Home / Self Care | Payer: PPO | Attending: Vascular Surgery | Admitting: Vascular Surgery

## 2022-05-15 ENCOUNTER — Other Ambulatory Visit (HOSPITAL_COMMUNITY): Payer: Self-pay

## 2022-05-15 ENCOUNTER — Ambulatory Visit (HOSPITAL_BASED_OUTPATIENT_CLINIC_OR_DEPARTMENT_OTHER): Payer: PPO

## 2022-05-15 ENCOUNTER — Encounter (HOSPITAL_COMMUNITY)
Admission: RE | Disposition: A | Discharge status: Home / Self Care | Payer: Self-pay | Source: Home / Self Care | Attending: Vascular Surgery

## 2022-05-15 DIAGNOSIS — Z87891 Personal history of nicotine dependence: Secondary | ICD-10-CM | POA: Diagnosis not present

## 2022-05-15 DIAGNOSIS — Z7982 Long term (current) use of aspirin: Secondary | ICD-10-CM | POA: Diagnosis not present

## 2022-05-15 DIAGNOSIS — I739 Peripheral vascular disease, unspecified: Secondary | ICD-10-CM

## 2022-05-15 DIAGNOSIS — Z7984 Long term (current) use of oral hypoglycemic drugs: Secondary | ICD-10-CM | POA: Insufficient documentation

## 2022-05-15 DIAGNOSIS — E1152 Type 2 diabetes mellitus with diabetic peripheral angiopathy with gangrene: Secondary | ICD-10-CM | POA: Insufficient documentation

## 2022-05-15 DIAGNOSIS — Z794 Long term (current) use of insulin: Secondary | ICD-10-CM | POA: Insufficient documentation

## 2022-05-15 DIAGNOSIS — L97529 Non-pressure chronic ulcer of other part of left foot with unspecified severity: Secondary | ICD-10-CM | POA: Diagnosis not present

## 2022-05-15 DIAGNOSIS — E7800 Pure hypercholesterolemia, unspecified: Secondary | ICD-10-CM | POA: Insufficient documentation

## 2022-05-15 DIAGNOSIS — I2510 Atherosclerotic heart disease of native coronary artery without angina pectoris: Secondary | ICD-10-CM | POA: Insufficient documentation

## 2022-05-15 DIAGNOSIS — I70262 Atherosclerosis of native arteries of extremities with gangrene, left leg: Secondary | ICD-10-CM | POA: Insufficient documentation

## 2022-05-15 DIAGNOSIS — I10 Essential (primary) hypertension: Secondary | ICD-10-CM | POA: Diagnosis not present

## 2022-05-15 HISTORY — PX: ABDOMINAL AORTOGRAM W/LOWER EXTREMITY: CATH118223

## 2022-05-15 LAB — POCT I-STAT, CHEM 8
BUN: 29 mg/dL — ABNORMAL HIGH (ref 8–23)
Calcium, Ion: 1.2 mmol/L (ref 1.15–1.40)
Chloride: 108 mmol/L (ref 98–111)
Creatinine, Ser: 1.2 mg/dL (ref 0.61–1.24)
Glucose, Bld: 143 mg/dL — ABNORMAL HIGH (ref 70–99)
HCT: 41 % (ref 39.0–52.0)
Hemoglobin: 13.9 g/dL (ref 13.0–17.0)
Potassium: 4.4 mmol/L (ref 3.5–5.1)
Sodium: 141 mmol/L (ref 135–145)
TCO2: 25 mmol/L (ref 22–32)

## 2022-05-15 LAB — GLUCOSE, CAPILLARY: Glucose-Capillary: 94 mg/dL (ref 70–99)

## 2022-05-15 SURGERY — ABDOMINAL AORTOGRAM W/LOWER EXTREMITY
Anesthesia: LOCAL | Laterality: Left

## 2022-05-15 MED ORDER — MIDAZOLAM HCL 2 MG/2ML IJ SOLN
INTRAMUSCULAR | Status: AC
  Filled 2022-05-15: qty 2

## 2022-05-15 MED ORDER — SODIUM CHLORIDE 0.9 % IV SOLN: INTRAVENOUS | Status: DC

## 2022-05-15 MED ORDER — IODIXANOL 320 MG/ML IV SOLN
INTRAVENOUS | Status: DC | PRN
  Administered 2022-05-15: 113 mL

## 2022-05-15 MED ORDER — LIDOCAINE HCL (PF) 1 % IJ SOLN
INTRAMUSCULAR | Status: AC
  Filled 2022-05-15: qty 30

## 2022-05-15 MED ORDER — FENTANYL CITRATE (PF) 100 MCG/2ML IJ SOLN
INTRAMUSCULAR | Status: AC
  Filled 2022-05-15: qty 2

## 2022-05-15 MED ORDER — HYDRALAZINE HCL 20 MG/ML IJ SOLN
INTRAMUSCULAR | Status: DC | PRN
  Administered 2022-05-15: 10 mg via INTRAVENOUS

## 2022-05-15 MED ORDER — SERTRALINE HCL 25 MG PO TABS: 25.0000 mg | ORAL_TABLET | Freq: Every day | ORAL | 0 refills | Status: DC

## 2022-05-15 MED ORDER — SODIUM CHLORIDE 0.9 % WEIGHT BASED INFUSION: 1.0000 mL/kg/h | INTRAVENOUS | Status: DC

## 2022-05-15 MED ORDER — HEPARIN (PORCINE) IN NACL 1000-0.9 UT/500ML-% IV SOLN
INTRAVENOUS | Status: DC | PRN
  Administered 2022-05-15 (×2): 500 mL

## 2022-05-15 MED ORDER — ONDANSETRON HCL 4 MG/2ML IJ SOLN: 4.0000 mg | Freq: Four times a day (QID) | INTRAMUSCULAR | Status: DC | PRN

## 2022-05-15 MED ORDER — LIDOCAINE HCL (PF) 1 % IJ SOLN
INTRAMUSCULAR | Status: DC | PRN
  Administered 2022-05-15: 15 mL

## 2022-05-15 MED ORDER — GLIMEPIRIDE 4 MG PO TABS
8.0000 mg | ORAL_TABLET | Freq: Every day | ORAL | 1 refills | Status: DC
  Filled 2022-05-15: qty 180, 90d supply, fill #0
  Filled 2022-08-02: qty 180, 90d supply, fill #1

## 2022-05-15 MED ORDER — LABETALOL HCL 5 MG/ML IV SOLN: 10.0000 mg | INTRAVENOUS | Status: DC | PRN

## 2022-05-15 MED ORDER — SODIUM CHLORIDE 0.9% FLUSH
3.0000 mL | Freq: Two times a day (BID) | INTRAVENOUS | Status: DC
Start: 2022-05-16 — End: 2022-05-15

## 2022-05-15 MED ORDER — HYDRALAZINE HCL 20 MG/ML IJ SOLN
INTRAMUSCULAR | Status: AC
  Filled 2022-05-15: qty 1

## 2022-05-15 MED ORDER — HEPARIN (PORCINE) IN NACL 1000-0.9 UT/500ML-% IV SOLN
INTRAVENOUS | Status: AC
  Filled 2022-05-15: qty 1000

## 2022-05-15 MED ORDER — SODIUM CHLORIDE 0.9% FLUSH: 3.0000 mL | INTRAVENOUS | Status: DC | PRN

## 2022-05-15 MED ORDER — HYDRALAZINE HCL 20 MG/ML IJ SOLN
5.0000 mg | INTRAMUSCULAR | Status: DC | PRN
  Filled 2022-05-15: qty 1

## 2022-05-15 MED ORDER — ONDANSETRON HCL 4 MG/2ML IJ SOLN
INTRAMUSCULAR | Status: AC
  Administered 2022-05-15: 4 mg via INTRAVENOUS
  Filled 2022-05-15: qty 2

## 2022-05-15 MED ORDER — SERTRALINE HCL 50 MG PO TABS
50.0000 mg | ORAL_TABLET | Freq: Every day | ORAL | 0 refills | Status: DC
  Filled 2022-05-15: qty 30, 30d supply, fill #0

## 2022-05-15 MED ORDER — ACETAMINOPHEN 325 MG PO TABS: 650.0000 mg | ORAL_TABLET | ORAL | Status: DC | PRN

## 2022-05-15 MED ORDER — SODIUM CHLORIDE 0.9 % IV SOLN: 250.0000 mL | INTRAVENOUS | Status: DC | PRN

## 2022-05-15 MED ORDER — FENTANYL CITRATE (PF) 100 MCG/2ML IJ SOLN
INTRAMUSCULAR | Status: DC | PRN
  Administered 2022-05-15: 50 ug via INTRAVENOUS

## 2022-05-15 MED ORDER — MIDAZOLAM HCL 2 MG/2ML IJ SOLN
INTRAMUSCULAR | Status: DC | PRN
  Administered 2022-05-15: 1 mg via INTRAVENOUS

## 2022-05-15 SURGICAL SUPPLY — 12 items
CATH ANGIO 5F PIGTAIL 65CM (CATHETERS) IMPLANT
CATH CROSS OVER TEMPO 5F (CATHETERS) IMPLANT
CATH STRAIGHT 5FR 65CM (CATHETERS) IMPLANT
CLOSURE MYNX CONTROL 5F (Vascular Products) IMPLANT
KIT MICROPUNCTURE NIT STIFF (SHEATH) IMPLANT
KIT PV (KITS) ×2 IMPLANT
SHEATH PINNACLE 5F 10CM (SHEATH) IMPLANT
SHEATH PROBE COVER 6X72 (BAG) IMPLANT
SYR MEDRAD MARK V 150ML (SYRINGE) IMPLANT
TRANSDUCER W/STOPCOCK (MISCELLANEOUS) ×2 IMPLANT
TRAY PV CATH (CUSTOM PROCEDURE TRAY) ×2 IMPLANT
WIRE HITORQ VERSACORE ST 145CM (WIRE) IMPLANT

## 2022-05-15 NOTE — Interval H&P Note (Signed)
History and Physical Interval Note:  05/15/2022 1:27 PM  Justin Key  has presented today for surgery, with the diagnosis of PAD.  The various methods of treatment have been discussed with the patient and family. After consideration of risks, benefits and other options for treatment, the patient has consented to  Procedure(s): ABDOMINAL AORTOGRAM W/LOWER EXTREMITY (N/A) as a surgical intervention.  The patient's history has been reviewed, patient examined, no change in status, stable for surgery.  I have reviewed the patient's chart and labs.  Questions were answered to the patient's satisfaction.     Deitra Mayo

## 2022-05-15 NOTE — Progress Notes (Signed)
Pt used urinal x 2-second with spill-bed linen changed-wife reports pt had pure wick during last visit-pure wick placed for pt comfort-warm blankets applied

## 2022-05-15 NOTE — Op Note (Signed)
PATIENT: Justin Key      MRN: [id number] DOB: 04/03/1951    DATE OF PROCEDURE: 05/15/2022  INDICATIONS:    Justin Key is a 71 y.o. male who was seen in the office yesterday with dry gangrene of the left great toe which she had for several weeks.  He had evidence of severe infrainguinal arterial occlusive disease and presents for arteriography  PROCEDURE:    Conscious sedation Ultrasound-guided access to the right common femoral artery Aortogram with bilateral iliac arteriogram Selective catheterization of the left external iliac artery (second-order catheterization) Left lower extremity arteriogram Minx closure right common femoral artery  SURGEON: Judeth Cornfield. Scot Dock, MD, FACS  ANESTHESIA: Local with sedation  EBL: Minimal  TECHNIQUE: The patient was brought to the peripheral vascular lab and was sedated. The period of conscious sedation was 29 minutes.  During that time period, I was present face-to-face 100% of the time.  The patient was administered 1 mg of Versed and 50 mcg of fentanyl. The patient's heart rate, blood pressure, and oxygen saturation were monitored by the nurse continuously during the procedure.  Both groins were prepped and draped in the usual sterile fashion.  Under ultrasound guidance, after the skin was anesthetized, I cannulated the right common femoral artery with a micropuncture needle and a micropuncture sheath was introduced over a wire.  This was exchanged for a 5 [place] sheath over a Bentson wire.  By ultrasound the femoral artery was patent. A real-time image was obtained and sent to the server.  The pigtail catheter was positioned at the L1 vertebral body and flush aortogram obtained.  The catheter was in position above the aortic bifurcation and an oblique iliac projection was obtained.  I then exchanged the pigtail catheter for a crossover catheter which was positioned in the proximal left common iliac artery.  The Bentson wire was  advanced into the deep femoral artery and the crossover catheter exchanged for a straight catheter which was positioned in the left common femoral artery.  Selective left external iliac arteriogram was obtained with left lower extremity runoff.  At the completion the catheter was removed.  The right common femoral artery was closed with the Mynx device without complication.  FINDINGS:   Single renal arteries bilaterally with no significant renal artery stenosis identified. The infrarenal aorta is widely patent.  Both common iliac arteries, external iliac arteries, and hypogastric arteries are patent.  There is some narrowing in the proximal left hypogastric artery. This patient did have a history of some mild chronic kidney disease and therefore to limit contrast I studied the left leg only. On the left side the common femoral and deep femoral artery are patent.  The superficial femoral artery was occluded at its origin with reconstitution of the popliteal artery at the level of the knee.  There was single-vessel runoff via the peroneal artery.  The anterior tibial artery was occluded as was the dorsalis pedis artery.  The posterior tibial artery was occluded proximally.  There was moderate diffuse disease throughout the tibial peroneal trunk and proximal peroneal artery.  Distally the peroneal artery reconstituted the posterior tibial artery just above the ankle.  There was runoff into the foot via the medial plantar branch.  GLASS: Stage III high complexity disease  WIFI: 2 3 1.  Clinical stage IV.   Amputation risk high.   Potential benefit from revascularization high.     CLINICAL NOTE: He has diffuse infrainguinal arterial occlusive disease and I think his  best chance for limb salvage would be a fem to distal posterior tibial artery bypass.  He has known significant coronary artery disease.  I will try to arrange a vein map while he is here today and then he will need outpatient cardiac  evaluation by Dr. Pernell Dupre who is his cardiologist.  Pending this evaluation we could potentially schedule him for surgery.  If he is felt to be too high risk for surgery I can ask my partners to review films to see if he might be a candidate for a retrograde tibial intervention.    Deitra Mayo, MD, FACS [facility]  DATE OF DICTATION:   05/15/2022

## 2022-05-15 NOTE — Progress Notes (Signed)
At ~1715 pt attempting to redress in fast paced motion-assisted pt-pt stating he is ready to leave and has been here long enough today-will not follow advisement to not bend right leg-pt speaking in an angry tone-states he has not eaten all day-pt refused food tray upon arrival to [facility] and again at a later time-pt was overheard by staff cursing while waiting in room alone for wife/transport for d/c -c/o about length of stay at [facility] today

## 2022-05-16 ENCOUNTER — Other Ambulatory Visit (HOSPITAL_COMMUNITY): Payer: Self-pay

## 2022-05-16 DIAGNOSIS — I739 Peripheral vascular disease, unspecified: Secondary | ICD-10-CM | POA: Diagnosis not present

## 2022-05-16 NOTE — Progress Notes (Signed)
Subjective:   Patient ID: Justin Key, male   DOB: 71 y.o.   MRN: [id number]   HPI Chief Complaint  Patient presents with   Diabetic Ulcer    Pt came in today with a diabetic ulcer, started 3 weeks ago, ulcer is on the tip of the left hallux, pt has very little pain, pt rates the pain a 1 out of 10, t the pain is a throbbing,  hallux is black at the tip, and foot is red and there is some swelling, X-Rays were done today, pt is not having any N/V/F/C/SOB, Seen by [facility] in the hospital,  A1c-  7.8  BG- 55     71 year old male presents the office today with concerns of a wound to the tip of his left big toe.  He states he has noticed a black spot.  Denies any drainage or pus from swelling or redness.  Denies any fevers or chills.  He is scheduled for angio on Thursday with vascular surgery.   Review of Systems  All other systems reviewed and are negative.  Past Medical History:  Diagnosis Date   Adenomatous polyps    Back pain    Barrett's esophagus    Coronary artery disease    Diabetes mellitus without complication (HCC)    Failure to thrive in adult    Gastroparesis    GERD (gastroesophageal reflux disease)    Hyperlipidemia    Hypertension    Lumbar disc disease    Lumbar spinal stenosis    Myocardial infarction [facility]) 1997   Thoracic myelopathy 2020    Past Surgical History:  Procedure Laterality Date   BACK SURGERY     microdiskectomy x2   BIOPSY  06/21/2018   Procedure: BIOPSY;  Surgeon: Wilford Corner, MD;  Location: [facility];  Service: Endoscopy;;   BIOPSY  02/06/2020   Procedure: BIOPSY;  Surgeon: Wilford Corner, MD;  Location: [facility];  Service: Endoscopy;;   COLONOSCOPY  07/12/2012   Procedure: COLONOSCOPY;  Surgeon: Lear Ng, MD;  Location: [facility];  Service: Endoscopy;  Laterality: N/A;   COLONOSCOPY WITH PROPOFOL N/A 06/21/2018   Procedure: COLONOSCOPY WITH PROPOFOL;  Surgeon: Wilford Corner, MD;  Location: [facility];   Service: Endoscopy;  Laterality: N/A;   COLONOSCOPY WITH PROPOFOL N/A 02/06/2020   Procedure: COLONOSCOPY WITH PROPOFOL;  Surgeon: Wilford Corner, MD;  Location: [facility];  Service: Endoscopy;  Laterality: N/A;   ESOPHAGEAL MANOMETRY N/A 11/15/2017   Procedure: ESOPHAGEAL MANOMETRY (EM);  Surgeon: Wilford Corner, MD;  Location: [facility];  Service: Endoscopy;  Laterality: N/A;   ESOPHAGEAL MANOMETRY  01/29/2021   Procedure: ESOPHAGEAL MANOMETRY (EM);  Surgeon: Gatha Mayer, MD;  Location: [facility];  Service: Endoscopy;;   ESOPHAGOGASTRODUODENOSCOPY  07/12/2012   Procedure: ESOPHAGOGASTRODUODENOSCOPY (EGD);  Surgeon: Lear Ng, MD;  Location: [facility];  Service: Endoscopy;  Laterality: N/A;   ESOPHAGOGASTRODUODENOSCOPY (EGD) WITH PROPOFOL N/A 06/21/2018   Procedure: ESOPHAGOGASTRODUODENOSCOPY (EGD) WITH PROPOFOL;  Surgeon: Wilford Corner, MD;  Location: [facility];  Service: Endoscopy;  Laterality: N/A;   ESOPHAGOGASTRODUODENOSCOPY (EGD) WITH PROPOFOL N/A 02/06/2020   Procedure: ESOPHAGOGASTRODUODENOSCOPY (EGD) WITH PROPOFOL;  Surgeon: Wilford Corner, MD;  Location: [facility];  Service: Endoscopy;  Laterality: N/A;   FOREIGN BODY RETRIEVAL N/A 02/06/2020   Procedure: FOREIGN BODY RETRIEVAL;  Surgeon: Wilford Corner, MD;  Location: [facility];  Service: Endoscopy;  Laterality: N/A;   INTRAVASCULAR ULTRASOUND/IVUS N/A 08/24/2018   Procedure: Intravascular Ultrasound/IVUS;  Surgeon: Belva Crome, MD;  Location: [facility];  Service: Cardiovascular;  Laterality: N/A;   KNEE ARTHROSCOPY  1988   Left   LEFT HEART CATH AND CORONARY ANGIOGRAPHY N/A 08/24/2018   Procedure: LEFT HEART CATH AND CORONARY ANGIOGRAPHY;  Surgeon: Belva Crome, MD;  Location: [facility];  Service: Cardiovascular;  Laterality: N/A;   POLYPECTOMY  06/21/2018   Procedure: POLYPECTOMY;  Surgeon: Wilford Corner, MD;  Location: [facility];  Service: Endoscopy;;    POLYPECTOMY  02/06/2020   Procedure: POLYPECTOMY;  Surgeon: Wilford Corner, MD;  Location: [facility];  Service: Endoscopy;;   SPINAL CORD STIMULATOR IMPLANT     SPINAL CORD STIMULATOR REMOVAL     SPINAL FUSION     x 3   THORACIC DISCECTOMY N/A 03/15/2019   Procedure: Laminectomy and Foraminotomy - Thoracic eleven -twelve;  Surgeon: Kary Kos, MD;  Location: [facility];  Service: Neurosurgery;  Laterality: N/A;     Current Outpatient Medications:    doxycycline (VIBRA-TABS) 100 MG tablet, Take 1 tablet (100 mg total) by mouth 2 (two) times daily., Disp: 14 tablet, Rfl: 0   aspirin EC 81 MG tablet, Take 1 tablet (81 mg total) by mouth daily., Disp: 90 tablet, Rfl: 3   carboxymethylcellulose (REFRESH PLUS) 0.5 % SOLN, Place 1 drop into both eyes 3 (three) times daily as needed (dry eyes)., Disp: , Rfl:    empagliflozin (JARDIANCE) 10 MG TABS tablet, Take 1 tablet (10 mg total) by mouth daily., Disp: 90 tablet, Rfl: 4   glimepiride (AMARYL) 4 MG tablet, TAKE 2 TABLETS BY MOUTH ONCE A DAY, Disp: 180 tablet, Rfl: 1   glucose blood (FREESTYLE LITE) test strip, Use to check blood glucose 2 times daily, Disp: 200 strip, Rfl: 4   ibuprofen (ADVIL,MOTRIN) 200 MG tablet, Take 600-800 mg by mouth every 8 (eight) hours as needed for moderate pain. , Disp: , Rfl:    insulin degludec (TRESIBA FLEXTOUCH) 100 UNIT/ML FlexTouch Pen, Inject 8 units into the skin every evening, Disp: 3 mL, Rfl: 0   Insulin Pen Needle (BD PEN NEEDLE NANO 2ND GEN) 32G X 4 MM MISC, Use as directed once a day, Disp: 100 each, Rfl: 6   Insulin Pen Needle 32G X 4 MM MISC, Use as directed daily, Disp: 50 each, Rfl: 6   lidocaine (LIDODERM) 5 %, Apply 2 patches to affected area daily for up to 12 hours.  Remove for 12 hours & repeat as directed. (Patient taking differently: Place 1-2 patches onto the skin every 12 (twelve) hours as needed (pain).), Disp: 60 patch, Rfl: 11   metFORMIN (GLUCOPHAGE-XR) 500 MG 24 hr tablet, TAKE 1 TABLET BY  MOUTH TWO TIMES DAILY, Disp: 60 tablet, Rfl: 6   metoCLOPramide (REGLAN) 5 MG tablet, TAKE 1 TABLET BY MOUTH 2 TIMES DAILY BEFORE MEALS AS NEEDED, Disp: 60 tablet, Rfl: 0   metoprolol succinate (TOPROL-XL) 25 MG 24 hr tablet, Take 1 tablet (25 mg total) by mouth daily., Disp: 90 tablet, Rfl: 3   mirtazapine (REMERON) 15 MG tablet, Take 1 tablet (15 mg total) by mouth at bedtime., Disp: 90 tablet, Rfl: 3   nitroGLYCERIN (NITROSTAT) 0.4 MG SL tablet, Place 1 tablet (0.4 mg total) under the tongue every 5 (five) minutes as needed for chest pain. If no relief after 3 doses call 911, Disp: 25 tablet, Rfl: 3   pantoprazole (PROTONIX) 40 MG tablet, Take 1 tablet (40 mg total) by mouth 2 (two) times daily., Disp: 180 tablet, Rfl: 0  sertraline (ZOLOFT) 25 MG tablet, Take 1 tablet by mouth daily., Disp: 90 tablet, Rfl: 0   sertraline (ZOLOFT) 50 MG tablet, Take 1 tablet by mouth once a day, Disp: 30 tablet, Rfl: 0   sitaGLIPtin (JANUVIA) 100 MG tablet, Take 1 tablet (100 mg total) by mouth daily., Disp: 90 tablet, Rfl: 4   traMADol (ULTRAM) 50 MG tablet, Take by mouth., Disp: , Rfl:    Vibegron (GEMTESA) 75 MG TABS, Take 1 tablet by mouth daily., Disp: , Rfl:   Allergies  Allergen Reactions   Humibid La [Guaifenesin] Other (See Comments)    hallucinations          Objective:  Physical Exam  General: AAO x3, NAD  Dermatological: The distal aspect left hallux is necrotic tissue however there is no surrounding erythema or warmth.  There is no fluctuation or crepitation and there is no malodor.  There is mild edema to the toe into the foot.         Vascular: Dorsalis Pedis artery and Posterior Tibial artery pedal pulses are decreased bilateral with immedate capillary fill time.  There is no pain with calf compression, swelling, warmth, erythema.   Neruologic: Sensation decreased with Semmes Weinstein monofilament  Musculoskeletal: Hallux valgus present.  Muscular strength 5/5 in all groups  tested bilateral.     Assessment:   Ulcer, gangrene left hallux     Plan:  -Treatment options discussed including all alternatives, risks, and complications. -Etiology of symptoms were discussed -X-rays were obtained and reviewed with the patient.  3 views left foot were obtained.  No definitive evidence of acute osteomyelitis or tiny soft tissue edema. -Scheduled for angio with vascular on Thursday -Doxycycline -Offloading with surgical shoe -Monitor for any clinical signs or symptoms of infection and directed to call the office immediately should any occur or go to the ER.  Return in about 1 year (around 05/12/2023).  Trula Slade DPM

## 2022-05-18 ENCOUNTER — Other Ambulatory Visit (HOSPITAL_COMMUNITY): Payer: Self-pay

## 2022-05-18 ENCOUNTER — Encounter (HOSPITAL_COMMUNITY): Payer: Self-pay | Admitting: Vascular Surgery

## 2022-05-20 ENCOUNTER — Telehealth: Payer: Self-pay | Admitting: *Deleted

## 2022-05-20 NOTE — Telephone Encounter (Signed)
   Pre-operative Risk Assessment    Patient Name: Justin Key  DOB: April 17, 1951 MRN: [id number]      Request for Surgical Clearance    Procedure:   FEMORAL - TIBIAL BYPASS  Date of Surgery:  Clearance TBD                                 Surgeon:  DR. DICKSON  Surgeon's Group or Practice Name:  [facility] Phone number:  [phone] Fax number:  [phone]   Type of Clearance Requested:   - Medical  - Pharmacy:  Hold Aspirin NOT INDICATED   Type of Anesthesia:  Not Indicated   Additional requests/questions:    Signed, Jeanann Lewandowsky   05/20/2022, 11:30 AM

## 2022-05-20 NOTE — Telephone Encounter (Signed)
   Name: Justin Key  DOB: 07-26-1951  MRN: [id number]  Primary Cardiologist: Sinclair Grooms, MD   Preoperative team, please contact this patient and set up a phone call appointment for further preoperative risk assessment. Please obtain consent and complete medication review. Thank you for your help.  I confirm that guidance regarding antiplatelet and oral anticoagulation therapy has been completed and, if necessary, noted below. -Given high risk for bleeding w this procedure, holding ASA for 7 days should be acceptable.   Richardson Dopp, PA-C 05/20/2022, 5:54 PM [place]

## 2022-05-22 NOTE — Telephone Encounter (Signed)
Left message for the pt to call back for a tele pre op appt.  

## 2022-05-26 ENCOUNTER — Telehealth: Payer: Self-pay | Admitting: *Deleted

## 2022-05-26 NOTE — Telephone Encounter (Signed)
S/w the pt's wife who has scheduled a tele pre op appt 05/29/22 @ 10 am for the pt. Med rec and consent are done .

## 2022-05-26 NOTE — Telephone Encounter (Signed)
S/w the pt's wife who has scheduled a tele pre op appt 05/29/22 @ 10 am for the pt. Med rec and consent are done .    Patient Consent for Virtual Visit        Justin Key has provided verbal consent on 05/26/2022 for a virtual visit (video or telephone).   CONSENT FOR VIRTUAL VISIT FOR:  Justin Key  By participating in this virtual visit I agree to the following:  I hereby voluntarily request, consent and authorize [facility] and its employed or contracted physicians, physician assistants, nurse practitioners or other licensed health care professionals (the Practitioner), to provide me with telemedicine health care services (the "Services") as deemed necessary by the treating Practitioner. I acknowledge and consent to receive the Services by the Practitioner via telemedicine. I understand that the telemedicine visit will involve communicating with the Practitioner through live audiovisual communication technology and the disclosure of certain medical information by electronic transmission. I acknowledge that I have been given the opportunity to request an in-person assessment or other available alternative prior to the telemedicine visit and am voluntarily participating in the telemedicine visit.  I understand that I have the right to withhold or withdraw my consent to the use of telemedicine in the course of my care at any time, without affecting my right to future care or treatment, and that the Practitioner or I may terminate the telemedicine visit at any time. I understand that I have the right to inspect all information obtained and/or recorded in the course of the telemedicine visit and may receive copies of available information for a reasonable fee.  I understand that some of the potential risks of receiving the Services via telemedicine include:  Delay or interruption in medical evaluation due to technological equipment failure or disruption; Information transmitted may  not be sufficient (e.g. poor resolution of images) to allow for appropriate medical decision making by the Practitioner; and/or  In rare instances, security protocols could fail, causing a breach of personal health information.  Furthermore, I acknowledge that it is my responsibility to provide information about my medical history, conditions and care that is complete and accurate to the best of my ability. I acknowledge that Practitioner's advice, recommendations, and/or decision may be based on factors not within their control, such as incomplete or inaccurate data provided by me or distortions of diagnostic images or specimens that may result from electronic transmissions. I understand that the practice of medicine is not an exact science and that Practitioner makes no warranties or guarantees regarding treatment outcomes. I acknowledge that a copy of this consent can be made available to me via my patient portal ([facility]), or I can request a printed copy by calling the office of [facility].    I understand that my insurance will be billed for this visit.   I have read or had this consent read to me. I understand the contents of this consent, which adequately explains the benefits and risks of the Services being provided via telemedicine.  I have been provided ample opportunity to ask questions regarding this consent and the Services and have had my questions answered to my satisfaction. I give my informed consent for the services to be provided through the use of telemedicine in my medical care

## 2022-05-26 NOTE — Telephone Encounter (Signed)
Patient's wife is returning Carol's call. Please advise.

## 2022-05-28 NOTE — Progress Notes (Signed)
Virtual Visit via Telephone Note   Because of [employer] co-morbid illnesses, he is at least at moderate risk for complications without adequate follow up.  This format is felt to be most appropriate for this patient at this time.  The patient did not have access to video technology/had technical difficulties with video requiring transitioning to audio format only (telephone).  All issues noted in this document were discussed and addressed.  No physical exam could be performed with this format.  Please refer to the patient's chart for his consent to telehealth for [facility].  Evaluation Performed:  Preoperative cardiovascular risk assessment _____________   Date:  05/28/2022   Patient ID:  Justin Key, DOB 21-May-1951, MRN [id number] Patient Location:  Home Provider location:   Office  Primary Care Provider:  Kathyrn Lass, MD Primary Cardiologist:  Sinclair Grooms, MD  Chief Complaint / Patient Profile   71 y.o. y/o male with a h/o CAD with prior MI in 1997 treated with angioplasty, repeat catheterization 08/2018 with diffuse CAD and stable/chronic coronary presentation managed medically, type 2 diabetes, hypertension, chronic pain syndrome related to lumbar disc disease, hyperlipidemia who is pending femoral-tibial bypass and presents today for telephonic preoperative cardiovascular risk assessment.  Past Medical History    Past Medical History:  Diagnosis Date   Adenomatous polyps    Back pain    Barrett's esophagus    Coronary artery disease    Diabetes mellitus without complication (HCC)    Failure to thrive in adult    Gastroparesis    GERD (gastroesophageal reflux disease)    Hyperlipidemia    Hypertension    Lumbar disc disease    Lumbar spinal stenosis    Myocardial infarction [facility]) 1997   Thoracic myelopathy 2020   Past Surgical History:  Procedure Laterality Date   ABDOMINAL AORTOGRAM W/LOWER EXTREMITY Left 05/15/2022   Procedure:  ABDOMINAL AORTOGRAM W/LOWER EXTREMITY;  Surgeon: Angelia Mould, MD;  Location: [facility];  Service: Cardiovascular;  Laterality: Left;   BACK SURGERY     microdiskectomy x2   BIOPSY  06/21/2018   Procedure: BIOPSY;  Surgeon: Wilford Corner, MD;  Location: [facility];  Service: Endoscopy;;   BIOPSY  02/06/2020   Procedure: BIOPSY;  Surgeon: Wilford Corner, MD;  Location: [facility];  Service: Endoscopy;;   COLONOSCOPY  07/12/2012   Procedure: COLONOSCOPY;  Surgeon: Lear Ng, MD;  Location: [facility];  Service: Endoscopy;  Laterality: N/A;   COLONOSCOPY WITH PROPOFOL N/A 06/21/2018   Procedure: COLONOSCOPY WITH PROPOFOL;  Surgeon: Wilford Corner, MD;  Location: [facility];  Service: Endoscopy;  Laterality: N/A;   COLONOSCOPY WITH PROPOFOL N/A 02/06/2020   Procedure: COLONOSCOPY WITH PROPOFOL;  Surgeon: Wilford Corner, MD;  Location: [facility];  Service: Endoscopy;  Laterality: N/A;   ESOPHAGEAL MANOMETRY N/A 11/15/2017   Procedure: ESOPHAGEAL MANOMETRY (EM);  Surgeon: Wilford Corner, MD;  Location: [facility];  Service: Endoscopy;  Laterality: N/A;   ESOPHAGEAL MANOMETRY  01/29/2021   Procedure: ESOPHAGEAL MANOMETRY (EM);  Surgeon: Gatha Mayer, MD;  Location: [facility];  Service: Endoscopy;;   ESOPHAGOGASTRODUODENOSCOPY  07/12/2012   Procedure: ESOPHAGOGASTRODUODENOSCOPY (EGD);  Surgeon: Lear Ng, MD;  Location: [facility];  Service: Endoscopy;  Laterality: N/A;   ESOPHAGOGASTRODUODENOSCOPY (EGD) WITH PROPOFOL N/A 06/21/2018   Procedure: ESOPHAGOGASTRODUODENOSCOPY (EGD) WITH PROPOFOL;  Surgeon: Wilford Corner, MD;  [facility]: [facility];  Service: Endoscopy;  Laterality: N/A;   ESOPHAGOGASTRODUODENOSCOPY (EGD) WITH PROPOFOL N/A 02/06/2020  Procedure: ESOPHAGOGASTRODUODENOSCOPY (EGD) WITH PROPOFOL;  Surgeon: Wilford Corner, MD;  [facility]: [facility];  Service: Endoscopy;  Laterality: N/A;   FOREIGN BODY RETRIEVAL N/A  02/06/2020   Procedure: FOREIGN BODY RETRIEVAL;  Surgeon: Wilford Corner, MD;  Location: [facility];  Service: Endoscopy;  Laterality: N/A;   INTRAVASCULAR ULTRASOUND/IVUS N/A 08/24/2018   Procedure: Intravascular Ultrasound/IVUS;  Surgeon: Belva Crome, MD;  Location: [facility];  Service: Cardiovascular;  Laterality: N/A;   KNEE ARTHROSCOPY  1988   Left   LEFT HEART CATH AND CORONARY ANGIOGRAPHY N/A 08/24/2018   Procedure: LEFT HEART CATH AND CORONARY ANGIOGRAPHY;  Surgeon: Belva Crome, MD;  Location: [facility];  Service: Cardiovascular;  Laterality: N/A;   POLYPECTOMY  06/21/2018   Procedure: POLYPECTOMY;  Surgeon: Wilford Corner, MD;  [facility]: [facility];  Service: Endoscopy;;   POLYPECTOMY  02/06/2020   Procedure: POLYPECTOMY;  Surgeon: Wilford Corner, MD;  Location: [facility];  Service: Endoscopy;;   SPINAL CORD STIMULATOR IMPLANT     SPINAL CORD STIMULATOR REMOVAL     SPINAL FUSION     x 3   THORACIC DISCECTOMY N/A 03/15/2019   Procedure: Laminectomy and Foraminotomy - Thoracic eleven -twelve;  Surgeon: Kary Kos, MD;  Location: [facility];  Service: Neurosurgery;  Laterality: N/A;    Allergies  Allergies  Allergen Reactions   Humibid La [Guaifenesin] Other (See Comments)    hallucinations    History of Present Illness    Justin Key is a 71 y.o. male who presents via audio/video conferencing for a telehealth visit today.  Pt was last seen in cardiology clinic on 12/24/21 by Dr. Tamala Julian.  At that time Justin Key was asked to consider referral to lipid clinic for management of LDL above goal, he declined. The patient is now pending procedure as outlined above. Since his last visit, he had ED admission for chest pain. Troponins were mildly elevated. EKG did not reveal acute abnormalities. 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 He was referred to vascular surgery and now has surgery pending.  Today, he denies chest pain, shortness of breath, lower extremity edema, fatigue, palpitations, melena, hematuria, hemoptysis, diaphoresis, presyncope, syncope, orthopnea, and PND.  Reports his activity is limited by chronic back pain and weakness. He is not able to achieve > 4 METS activity, therefore nuclear stress testing is warranted for surgical clearance.   Home Medications    Prior to Admission medications   Medication Sig Start Date End Date Taking? Authorizing Provider  aspirin EC 81 MG tablet Take 1 tablet (81 mg total) by mouth daily. 10/18/18   Belva Crome, MD  carboxymethylcellulose (REFRESH PLUS) 0.5 % SOLN Place 1 drop into both eyes 3 (three) times daily as needed (dry eyes).    [provider]  doxycycline (VIBRA-TABS) 100 MG tablet Take 1 tablet (100 mg total) by mouth 2 (two) times daily. 05/11/22   Trula Slade, DPM  empagliflozin (JARDIANCE) 10 MG TABS tablet Take 1 tablet (10 mg total) by mouth daily. 06/16/21     glimepiride (AMARYL) 4 MG tablet TAKE 2 TABLETS BY MOUTH ONCE A DAY 05/15/22     glucose blood (FREESTYLE LITE) test strip Use to check blood glucose 2 times daily 11/04/21   Kathyrn Lass, MD  ibuprofen (ADVIL,MOTRIN) 200 MG tablet Take 600-800 mg by mouth every 8 (eight) hours as needed for moderate pain.     [provider]  insulin  degludec (TRESIBA FLEXTOUCH) 100 UNIT/ML FlexTouch Pen Inject 8 units into the skin every evening 03/06/22     Insulin Pen Needle (BD PEN NEEDLE NANO 2ND GEN) 32G X 4 MM MISC Use as directed once a day 03/09/22     Insulin Pen Needle 32G X 4 MM MISC Use as directed daily 06/16/21     lidocaine (LIDODERM) 5 % Apply 2 patches to affected area daily for up to 12 hours.  Remove for 12 hours & repeat as directed. Patient not taking: Reported on 05/26/2022 06/18/21     metFORMIN (GLUCOPHAGE-XR) 500 MG 24 hr tablet TAKE 1 TABLET BY MOUTH TWO TIMES DAILY 02/20/22     metoCLOPramide (REGLAN) 5 MG tablet TAKE 1 TABLET BY MOUTH 2  TIMES DAILY BEFORE MEALS AS NEEDED 05/04/22     metoprolol succinate (TOPROL-XL) 25 MG 24 hr tablet Take 1 tablet (25 mg total) by mouth daily. 02/20/22   Belva Crome, MD  mirtazapine (REMERON) 15 MG tablet Take 1 tablet (15 mg total) by mouth at bedtime. 06/25/21   Gatha Mayer, MD  nitroGLYCERIN (NITROSTAT) 0.4 MG SL tablet Place 1 tablet (0.4 mg total) under the tongue every 5 (five) minutes as needed for chest pain. If no relief after 3 doses call 911 04/29/22 07/28/22  Barb Merino, MD  pantoprazole (PROTONIX) 40 MG tablet Take 1 tablet (40 mg total) by mouth 2 (two) times daily. 05/04/22     sertraline (ZOLOFT) 25 MG tablet Take 1 tablet by mouth daily. Patient not taking: Reported on 05/26/2022 05/15/22     sertraline (ZOLOFT) 50 MG tablet Take 1 tablet by mouth once a day 05/15/22     sitaGLIPtin (JANUVIA) 100 MG tablet Take 1 tablet (100 mg total) by mouth daily. 06/16/21     traMADol (ULTRAM) 50 MG tablet Take by mouth. 04/23/22   [provider]  Vibegron (GEMTESA) 75 MG TABS Take 1 tablet by mouth daily.    [provider]    Physical Exam    Vital Signs:  Justin Key does not have vital signs available for review today.  Given telephonic nature of communication, physical exam is limited. AAOx3. NAD. Normal affect.  Speech and respirations are unlabored.  Accessory Clinical Findings    None  Assessment & Plan    1.  Preoperative Cardiovascular Risk Assessment: He will need to undergo nuclear stress testing prior to surgical clearance based on recent symptoms of chest pain and inability to achieve > 4 METS activity. Patient is aware that clearance will not be given until test results are completed.  Order placed for lexiscan myoview. I will route this to surgeon's office for their awareness.  -Given high risk for bleeding w this procedure, holding ASA for 7 days should be acceptable pending no evidence of ischemia on stress test.     A copy of this note will  be routed to requesting surgeon.  Time:   Today, I have spent 10 minutes with the patient with telehealth technology discussing medical history, symptoms, and management plan.     Emmaline Life, NP-C     05/28/2022, 10:48 AM [address] Office [phone] Fax [phone]

## 2022-05-29 ENCOUNTER — Ambulatory Visit: Payer: HMO | Attending: Cardiovascular Disease | Admitting: Nurse Practitioner

## 2022-05-29 ENCOUNTER — Encounter: Payer: Self-pay | Admitting: Nurse Practitioner

## 2022-05-29 ENCOUNTER — Other Ambulatory Visit (HOSPITAL_COMMUNITY): Payer: Self-pay

## 2022-05-29 DIAGNOSIS — I2510 Atherosclerotic heart disease of native coronary artery without angina pectoris: Secondary | ICD-10-CM

## 2022-05-29 DIAGNOSIS — Z01810 Encounter for preprocedural cardiovascular examination: Secondary | ICD-10-CM

## 2022-05-29 DIAGNOSIS — I25118 Atherosclerotic heart disease of native coronary artery with other forms of angina pectoris: Secondary | ICD-10-CM

## 2022-05-30 ENCOUNTER — Other Ambulatory Visit (HOSPITAL_COMMUNITY): Payer: Self-pay

## 2022-06-01 ENCOUNTER — Telehealth (HOSPITAL_COMMUNITY): Payer: Self-pay | Admitting: *Deleted

## 2022-06-01 ENCOUNTER — Other Ambulatory Visit (HOSPITAL_COMMUNITY): Payer: Self-pay

## 2022-06-01 MED ORDER — METOCLOPRAMIDE HCL 5 MG PO TABS
5.0000 mg | ORAL_TABLET | Freq: Two times a day (BID) | ORAL | 0 refills | Status: DC
  Filled 2022-06-01: qty 60, 30d supply, fill #0

## 2022-06-01 NOTE — Telephone Encounter (Signed)
Pt has been scheduled for a stress test per pre op provider Christen Bame, NP 06/05/22. I will update the requesting office.

## 2022-06-01 NOTE — Telephone Encounter (Signed)
Left message on voicemail per DPR in reference to upcoming appointment scheduled on 06/05/22 with detailed instructions given per Myocardial Perfusion Study Information Sheet for the test. LM to arrive 15 minutes early, and that it is imperative to arrive on time for appointment to keep from having the test rescheduled. If you need to cancel or reschedule your appointment, please call the office within 24 hours of your appointment. Failure to do so may result in a cancellation of your appointment, and a $50 no show fee. Phone number given for call back for any questions. Kirstie Peri

## 2022-06-02 ENCOUNTER — Other Ambulatory Visit (HOSPITAL_COMMUNITY): Payer: Self-pay

## 2022-06-03 ENCOUNTER — Other Ambulatory Visit (HOSPITAL_COMMUNITY): Payer: Self-pay

## 2022-06-05 ENCOUNTER — Ambulatory Visit (HOSPITAL_COMMUNITY): Payer: HMO | Attending: Cardiovascular Disease

## 2022-06-05 VITALS — Ht 75.0 in | Wt 197.0 lb

## 2022-06-05 DIAGNOSIS — Z01810 Encounter for preprocedural cardiovascular examination: Secondary | ICD-10-CM | POA: Diagnosis not present

## 2022-06-05 DIAGNOSIS — I2510 Atherosclerotic heart disease of native coronary artery without angina pectoris: Secondary | ICD-10-CM | POA: Diagnosis not present

## 2022-06-05 DIAGNOSIS — R110 Nausea: Secondary | ICD-10-CM | POA: Diagnosis not present

## 2022-06-05 LAB — MYOCARDIAL PERFUSION IMAGING
LV dias vol: 87 mL (ref 62–150)
LV sys vol: 45 mL
Nuc Stress EF: 48 %
Peak HR: 107 {beats}/min
Rest HR: 69 {beats}/min
Rest Nuclear Isotope Dose: 10.8 mCi
SDS: 0
SRS: 11
SSS: 12
ST Depression (mm): 0 mm
Stress Nuclear Isotope Dose: 32.4 mCi
TID: 1.03

## 2022-06-05 MED ORDER — TECHNETIUM TC 99M TETROFOSMIN IV KIT
10.8000 | PACK | Freq: Once | INTRAVENOUS | Status: AC | PRN
  Administered 2022-06-05: 10.8 via INTRAVENOUS

## 2022-06-05 MED ORDER — AMINOPHYLLINE 25 MG/ML IV SOLN
150.0000 mg | Freq: Once | INTRAVENOUS | Status: AC
  Administered 2022-06-05: 150 mg via INTRAVENOUS

## 2022-06-05 MED ORDER — TECHNETIUM TC 99M TETROFOSMIN IV KIT
32.4000 | PACK | Freq: Once | INTRAVENOUS | Status: AC | PRN
  Administered 2022-06-05: 32.4 via INTRAVENOUS

## 2022-06-05 MED ORDER — REGADENOSON 0.4 MG/5ML IV SOLN
0.4000 mg | Freq: Once | INTRAVENOUS | Status: AC
  Administered 2022-06-05: 0.4 mg via INTRAVENOUS

## 2022-06-05 NOTE — Telephone Encounter (Signed)
Dr. Tamala Julian, I ordered NST for surgical clearance for upcoming femoral-tibial bypass with Dr. Scot Dock due to patient unable to complete > 4 METS activity. NST read as intermediate risk with medium defect in apical to basal location that is fixed and with abnormal wall motion. LVEF 48%.   History of CAD s/p LHC 08/2018 that revealed moderately severe 2 vessel disease recommendation to manage medically:  30 to 40% ostial left main with minimal cross-sectional area 9.97 mm (significant less than 6 mm). Widely patent left anterior descending and diagonal.  LAD stops of the left ventricular apex. The circumflex is a large vessel that gives origin to 3 obtuse marginal branches.  The first marginal is tiny, with ostial 90% narrowing. 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  The larger third obtuse marginal is widely patent.  The mid circumflex beyond the second obtuse marginal contains eccentric 60% disease. The right coronary contains moderately severe diffuse disease from the proximal to distal segment and also involving the PDA. Left ventriculography reveals severe inferior and apical hypokinesis.  EF 40 to 45%.  Normal LVEDP  Do you feel that we can clear patient for upcoming procedure based on these findings? Please route your response to p cv div preop.  Thank you, Emmaline Life, NP-C  06/05/2022, 2:11 PM [address], [address] Office [phone] Fax [phone]

## 2022-06-05 NOTE — Telephone Encounter (Signed)
   Primary Cardiologist: Sinclair Grooms, MD  Chart reviewed as part of pre-operative protocol coverage. Given past medical history and time since last visit, based on ACC/AHA guidelines, NOSSON WENDER would be at acceptable risk for the planned procedure.  Patient underwent nuclear stress testing as part of his preoperative evaluation.  See notes from Dr. Tamala Julian attached below. Patient has been cleared to proceed with surgery.  Given high risk for bleeding w this procedure, holding ASA for 7 days should be acceptable.     I will route this recommendation to the requesting party via Epic fax function and remove from pre-op pool.  Please call with questions.  Emmaline Life, NP-C  06/05/2022, 3:51 PM [address] Office [phone] Fax [phone]

## 2022-06-08 ENCOUNTER — Other Ambulatory Visit (HOSPITAL_COMMUNITY): Payer: Self-pay

## 2022-06-09 ENCOUNTER — Other Ambulatory Visit (HOSPITAL_COMMUNITY): Payer: Self-pay

## 2022-06-09 DIAGNOSIS — I8003 Phlebitis and thrombophlebitis of superficial vessels of lower extremities, bilateral: Secondary | ICD-10-CM | POA: Diagnosis not present

## 2022-06-09 DIAGNOSIS — Z01810 Encounter for preprocedural cardiovascular examination: Secondary | ICD-10-CM | POA: Diagnosis not present

## 2022-06-09 DIAGNOSIS — I739 Peripheral vascular disease, unspecified: Secondary | ICD-10-CM | POA: Diagnosis not present

## 2022-06-09 DIAGNOSIS — I743 Embolism and thrombosis of arteries of the lower extremities: Secondary | ICD-10-CM | POA: Diagnosis not present

## 2022-06-09 DIAGNOSIS — Z7982 Long term (current) use of aspirin: Secondary | ICD-10-CM | POA: Diagnosis not present

## 2022-06-09 MED ORDER — TRESIBA FLEXTOUCH 100 UNIT/ML ~~LOC~~ SOPN
8.0000 [IU] | PEN_INJECTOR | Freq: Every day | SUBCUTANEOUS | 5 refills | Status: DC
  Filled 2022-06-09: qty 3, 28d supply, fill #0
  Filled 2022-07-15: qty 3, 28d supply, fill #1
  Filled 2022-08-12: qty 3, 28d supply, fill #2
  Filled 2022-09-16: qty 3, 28d supply, fill #3
  Filled 2022-10-27: qty 3, 28d supply, fill #4
  Filled 2022-11-18: qty 3, 28d supply, fill #5

## 2022-06-11 ENCOUNTER — Other Ambulatory Visit: Payer: Self-pay

## 2022-06-11 DIAGNOSIS — I70269 Atherosclerosis of native arteries of extremities with gangrene, unspecified extremity: Secondary | ICD-10-CM

## 2022-06-14 ENCOUNTER — Other Ambulatory Visit (HOSPITAL_COMMUNITY): Payer: Self-pay

## 2022-06-15 ENCOUNTER — Other Ambulatory Visit (HOSPITAL_COMMUNITY): Payer: Self-pay

## 2022-06-16 ENCOUNTER — Encounter (HOSPITAL_COMMUNITY): Payer: Self-pay

## 2022-06-16 ENCOUNTER — Other Ambulatory Visit (HOSPITAL_COMMUNITY): Payer: Self-pay

## 2022-06-16 MED ORDER — SERTRALINE HCL 50 MG PO TABS
50.0000 mg | ORAL_TABLET | Freq: Every day | ORAL | 0 refills | Status: DC
  Filled 2022-06-16: qty 30, 30d supply, fill #0

## 2022-06-16 NOTE — Progress Notes (Signed)
Surgical Instructions    Your procedure is scheduled on 06/23/22.  Report to [facility] Main Entrance "A" at 5:30 A.M., then check in with the Admitting office.  Call this number if you have problems the morning of surgery:  [phone]   If you have any questions prior to your surgery date call [phone]: Open Monday-Friday 8am-4pm    Remember:  Do not eat or drink after midnight the night before your surgery     Take these medicines the morning of surgery with A SIP OF WATER:  doxycycline (VIBRA-TABS)  metoprolol succinate (TOPROL-XL) pantoprazole (PROTONIX) sertraline (ZOLOFT)  IF NEEDED: carboxymethylcellulose (REFRESH PLUS)  nitroGLYCERIN (NITROSTAT) traMADol (ULTRAM)  As of today, STOP taking any Aspirin (unless otherwise instructed by your surgeon) Aleve, Naproxen, Ibuprofen, Motrin, Advil, Goody's, BC's, all herbal medications, fish oil, and all vitamins.  WHAT DO I DO ABOUT MY DIABETES MEDICATION?   Do not take oral diabetes medicines (pills) the morning of surgery.  Hold empagliflozin (JARDIANCE) 72 hours prior to your procedure.   THE NIGHT BEFORE SURGERY, take 4 units of insulin degludec (TRESIBA FLEXTOUCH).    THE MORNING OF SURGERY, do not take glimepiride (AMARYL) or metFORMIN (GLUCOPHAGE-XR) or sitaGLIPtin (JANUVIA).  The day of surgery, do not take other diabetes injectables, including Byetta (exenatide), Bydureon (exenatide ER), Victoza (liraglutide), or Trulicity (dulaglutide).  If your CBG is greater than 220 mg/dL, you may take  of your sliding scale (correction) dose of insulin.   HOW TO MANAGE YOUR DIABETES BEFORE AND AFTER SURGERY  Why is it important to control my blood sugar before and after surgery? Improving blood sugar levels before and after surgery helps healing and can limit problems. A way of improving blood sugar control is eating a healthy diet by:  Eating less sugar and carbohydrates  Increasing activity/exercise  Talking with  your doctor about reaching your blood sugar goals High blood sugars (greater than 180 mg/dL) can raise your risk of infections and slow your recovery, so you will need to focus on controlling your diabetes during the weeks before surgery. Make sure that the doctor who takes care of your diabetes knows about your planned surgery including the date and location.  How do I manage my blood sugar before surgery? Check your blood sugar at least 4 times a day, starting 2 days before surgery, to make sure that the level is not too high or low.  Check your blood sugar the morning of your surgery when you wake up and every 2 hours until you get to the Short Stay unit.  If your blood sugar is less than 70 mg/dL, you will need to treat for low blood sugar: Do not take insulin. Treat a low blood sugar (less than 70 mg/dL) with  cup of clear juice (cranberry or apple), 4 glucose tablets, OR glucose gel. Recheck blood sugar in 15 minutes after treatment (to make sure it is greater than 70 mg/dL). If your blood sugar is not greater than 70 mg/dL on recheck, call [phone] for further instructions. Report your blood sugar to the short stay nurse when you get to Short Stay.  If you are admitted to the hospital after surgery: Your blood sugar will be checked by the staff and you will probably be given insulin after surgery (instead of oral diabetes medicines) to make sure you have good blood sugar levels. The goal for blood sugar control after surgery is 80-180 mg/dL.        Do not wear jewelry  or makeup. Do not wear lotions, powders, perfumes/cologne or deodorant. Do not shave 48 hours prior to surgery.  Men may shave face and neck. Do not bring valuables to the hospital. Do not wear nail polish, gel polish, artificial nails, or any other type of covering on natural nails (fingers and toes) If you have artificial nails or gel coating that need to be removed by a nail salon, please have this removed prior  to surgery. Artificial nails or gel coating may interfere with anesthesia's ability to adequately monitor your vital signs.  [facility] is not responsible for any belongings or valuables.    Do NOT Smoke (Tobacco/Vaping)  24 hours prior to your procedure  If you use a CPAP at night, you may bring your mask for your overnight stay.   Contacts, glasses, hearing aids, dentures or partials may not be worn into surgery, please bring cases for these belongings   For patients admitted to the hospital, discharge time will be determined by your treatment team.   Patients discharged the day of surgery will not be allowed to drive home, and someone needs to stay with them for 24 hours.   SURGICAL WAITING ROOM VISITATION Patients having surgery or a procedure may have no more than 2 support people in the waiting area - these visitors may rotate.   Children under the age of 22 must have an adult with them who is not the patient. If the patient needs to stay at the hospital during part of their recovery, the visitor guidelines for inpatient rooms apply. Pre-op nurse will coordinate an appropriate time for 1 support person to accompany patient in pre-op.  This support person may not rotate.   Please refer to the [facility] website for the visitor guidelines for Inpatients (after your surgery is over and you are in a regular room).    Special instructions:    Oral Hygiene is also important to reduce your risk of infection.  Remember - BRUSH YOUR TEETH THE MORNING OF SURGERY WITH YOUR REGULAR TOOTHPASTE   [place]- Preparing For Surgery  Before surgery, you can play an important role. Because skin is not sterile, your skin needs to be as free of germs as possible. You can reduce the number of germs on your skin by washing with CHG (chlorahexidine gluconate) Soap before surgery.  CHG is an antiseptic cleaner which kills germs and bonds with the skin to continue killing germs even after washing.      Please do not use if you have an allergy to CHG or antibacterial soaps. If your skin becomes reddened/irritated stop using the CHG.  Do not shave (including legs and underarms) for at least 48 hours prior to first CHG shower. It is OK to shave your face.  Please follow these instructions carefully.     Shower the NIGHT BEFORE SURGERY and the MORNING OF SURGERY with CHG Soap.   If you chose to wash your hair, wash your hair first as usual with your normal shampoo. After you shampoo, rinse your hair and body thoroughly to remove the shampoo.  Then [employer] and genitals (private parts) with your normal soap and rinse thoroughly to remove soap.  After that Use CHG Soap as you would any other liquid soap. You can apply CHG directly to the skin and wash gently with a scrungie or a clean washcloth.   Apply the CHG Soap to your body ONLY FROM THE NECK DOWN.  Do not use on open wounds  or open sores. Avoid contact with your eyes, ears, mouth and genitals (private parts). Wash Face and genitals (private parts)  with your normal soap.   Wash thoroughly, paying special attention to the area where your surgery will be performed.  Thoroughly rinse your body with warm water from the neck down.  DO NOT shower/wash with your normal soap after using and rinsing off the CHG Soap.  Pat yourself dry with a CLEAN TOWEL.  Wear CLEAN PAJAMAS to bed the night before surgery  Place CLEAN SHEETS on your bed the night before your surgery  DO NOT SLEEP WITH PETS.   Day of Surgery: Take a shower with CHG soap. Wear Clean/Comfortable clothing the morning of surgery Do not apply any deodorants/lotions.   Remember to brush your teeth WITH YOUR REGULAR TOOTHPASTE.    If you received a COVID test during your pre-op visit, it is requested that you wear a mask when out in public, stay away from anyone that may not be feeling well, and notify your surgeon if you develop symptoms. If you have been in contact with  anyone that has tested positive in the last 10 days, please notify your surgeon.    Please read over the following fact sheets that you were given.

## 2022-06-17 ENCOUNTER — Encounter (HOSPITAL_COMMUNITY)
Admission: RE | Admit: 2022-06-17 | Discharge: 2022-06-17 | Disposition: A | Discharge status: Home / Self Care | Payer: HMO | Source: Ambulatory Visit | Attending: Vascular Surgery | Admitting: Vascular Surgery

## 2022-06-17 ENCOUNTER — Other Ambulatory Visit: Payer: Self-pay

## 2022-06-17 ENCOUNTER — Encounter (HOSPITAL_COMMUNITY): Payer: Self-pay

## 2022-06-17 VITALS — BP 139/90 | HR 66 | Temp 97.6°F | Resp 18 | Ht 75.0 in | Wt 194.5 lb

## 2022-06-17 DIAGNOSIS — Z01818 Encounter for other preprocedural examination: Secondary | ICD-10-CM

## 2022-06-17 DIAGNOSIS — Z01812 Encounter for preprocedural laboratory examination: Secondary | ICD-10-CM | POA: Insufficient documentation

## 2022-06-17 DIAGNOSIS — I700 Atherosclerosis of aorta: Secondary | ICD-10-CM | POA: Insufficient documentation

## 2022-06-17 DIAGNOSIS — E278 Other specified disorders of adrenal gland: Secondary | ICD-10-CM | POA: Insufficient documentation

## 2022-06-17 DIAGNOSIS — K209 Esophagitis, unspecified without bleeding: Secondary | ICD-10-CM | POA: Insufficient documentation

## 2022-06-17 DIAGNOSIS — I2510 Atherosclerotic heart disease of native coronary artery without angina pectoris: Secondary | ICD-10-CM | POA: Insufficient documentation

## 2022-06-17 DIAGNOSIS — E119 Type 2 diabetes mellitus without complications: Secondary | ICD-10-CM | POA: Diagnosis not present

## 2022-06-17 DIAGNOSIS — I70269 Atherosclerosis of native arteries of extremities with gangrene, unspecified extremity: Secondary | ICD-10-CM | POA: Diagnosis not present

## 2022-06-17 DIAGNOSIS — I252 Old myocardial infarction: Secondary | ICD-10-CM | POA: Insufficient documentation

## 2022-06-17 DIAGNOSIS — K5730 Diverticulosis of large intestine without perforation or abscess without bleeding: Secondary | ICD-10-CM | POA: Diagnosis not present

## 2022-06-17 HISTORY — DX: Depression, unspecified: F32.A

## 2022-06-17 LAB — URINALYSIS, ROUTINE W REFLEX MICROSCOPIC
Bacteria, UA: NONE SEEN
Bilirubin Urine: NEGATIVE
Glucose, UA: >=500 mg/dL — AB
Hgb urine dipstick: NEGATIVE
Ketones, ur: NEGATIVE mg/dL
Nitrite: NEGATIVE
Protein, ur: NEGATIVE mg/dL
Specific Gravity, Urine: 1.023 (ref 1.005–1.030)
pH: 5 (ref 5.0–8.0)

## 2022-06-17 LAB — COMPREHENSIVE METABOLIC PANEL
ALT: 22 U/L (ref 0–44)
AST: 24 U/L (ref 15–41)
Albumin: 3.8 g/dL (ref 3.5–5.0)
Alkaline Phosphatase: 73 U/L (ref 38–126)
Anion gap: 6 (ref 5–15)
BUN: 18 mg/dL (ref 8–23)
CO2: 24 mmol/L (ref 22–32)
Calcium: 8.9 mg/dL (ref 8.9–10.3)
Chloride: 108 mmol/L (ref 98–111)
Creatinine, Ser: 1.24 mg/dL (ref 0.61–1.24)
GFR, Estimated: >60 mL/min (ref >60)
Glucose, Bld: 142 mg/dL — ABNORMAL HIGH (ref 70–99)
Potassium: 4.5 mmol/L (ref 3.5–5.1)
Sodium: 138 mmol/L (ref 135–145)
Total Bilirubin: 0.6 mg/dL (ref 0.3–1.2)
Total Protein: 7.5 g/dL (ref 6.5–8.1)

## 2022-06-17 LAB — TYPE AND SCREEN
ABO/RH(D): O POS
Antibody Screen: NEGATIVE

## 2022-06-17 LAB — CBC
HCT: 45.6 % (ref 39.0–52.0)
Hemoglobin: 14.9 g/dL (ref 13.0–17.0)
MCH: 28.8 pg (ref 26.0–34.0)
MCHC: 32.7 g/dL (ref 30.0–36.0)
MCV: 88 fL (ref 80.0–100.0)
Platelets: 201 10*3/uL (ref 150–400)
RBC: 5.18 MIL/uL (ref 4.22–5.81)
RDW: 14.3 % (ref 11.5–15.5)
WBC: 8.7 10*3/uL (ref 4.0–10.5)
nRBC: 0 % (ref 0.0–0.2)

## 2022-06-17 LAB — PROTIME-INR
INR: 1 (ref 0.8–1.2)
Prothrombin Time: 12.9 seconds (ref 11.4–15.2)

## 2022-06-17 LAB — SURGICAL PCR SCREEN
MRSA, PCR: NEGATIVE
Staphylococcus aureus: NEGATIVE

## 2022-06-17 LAB — GLUCOSE, CAPILLARY: Glucose-Capillary: 147 mg/dL — ABNORMAL HIGH (ref 70–99)

## 2022-06-17 LAB — APTT: aPTT: 28 seconds (ref 24–36)

## 2022-06-17 NOTE — Progress Notes (Signed)
PCP - Dr. Kathyrn Lass Cardiologist - Dr. Daneen Schick III  PPM/ICD - denies  Chest x-ray - 04/28/22 EKG - 04/29/22 Stress Test - 06/05/22 ECHO - denies Cardiac Cath - 08/24/18  Sleep Study - denies   DM- Type 2 Fasting Blood Sugar - 130-140 Checks Blood Sugar twice a day  Blood Thinner Instructions: n/a Aspirin Instructions: hold 7 days  ERAS Protcol - no NPO   COVID TEST- n/a   Anesthesia review: yes, cardiac hx  Patient denies shortness of breath, fever, cough and chest pain at PAT appointment   All instructions explained to the patient, with a verbal understanding of the material. Patient agrees to go over the instructions while at home for a better understanding.  The opportunity to ask questions was provided.

## 2022-06-17 NOTE — Progress Notes (Signed)
Surgical Instructions    Your procedure is scheduled on 06/23/22.  Report to [facility] Main Entrance "A" at 5:30 A.M., then check in with the Admitting office.  Call this number if you have problems the morning of surgery:  [phone]   If you have any questions prior to your surgery date call [phone]: Open Monday-Friday 8am-4pm    Remember:  Do not eat or drink after midnight the night before your surgery     Take these medicines the morning of surgery with A SIP OF WATER:  metoprolol succinate (TOPROL-XL) pantoprazole (PROTONIX) sertraline (ZOLOFT) Vibegron (GEMTESA)   IF NEEDED: carboxymethylcellulose (REFRESH PLUS)  nitroGLYCERIN (NITROSTAT) traMADol (ULTRAM) metoCLOPramide (REGLAN)   As of today, STOP taking any Aspirin (unless otherwise instructed by your surgeon) Aleve, Naproxen, Ibuprofen, Motrin, Advil, Goody's, BC's, all herbal medications, fish oil, and all vitamins.  WHAT DO I DO ABOUT MY DIABETES MEDICATION?   Hold empagliflozin (JARDIANCE) 72 hours prior to your procedure.   THE NIGHT BEFORE SURGERY, take 4 units of insulin degludec (TRESIBA FLEXTOUCH).    THE MORNING OF SURGERY, do not take glimepiride (AMARYL) or metFORMIN (GLUCOPHAGE-XR) or sitaGLIPtin (JANUVIA).  The day of surgery, do not take other diabetes injectables, including Byetta (exenatide), Bydureon (exenatide ER), Victoza (liraglutide), or Trulicity (dulaglutide).  If your CBG is greater than 220 mg/dL, you may take  of your sliding scale (correction) dose of insulin.   HOW TO MANAGE YOUR DIABETES BEFORE AND AFTER SURGERY  Why is it important to control my blood sugar before and after surgery? Improving blood sugar levels before and after surgery helps healing and can limit problems. A way of improving blood sugar control is eating a healthy diet by:  Eating less sugar and carbohydrates  Increasing activity/exercise  Talking with your doctor about reaching your blood sugar  goals High blood sugars (greater than 180 mg/dL) can raise your risk of infections and slow your recovery, so you will need to focus on controlling your diabetes during the weeks before surgery. Make sure that the doctor who takes care of your diabetes knows about your planned surgery including the date and location.  How do I manage my blood sugar before surgery? Check your blood sugar at least 4 times a day, starting 2 days before surgery, to make sure that the level is not too high or low.  Check your blood sugar the morning of your surgery when you wake up and every 2 hours until you get to the Short Stay unit.  If your blood sugar is less than 70 mg/dL, you will need to treat for low blood sugar: Do not take insulin. Treat a low blood sugar (less than 70 mg/dL) with  cup of clear juice (cranberry or apple), 4 glucose tablets, OR glucose gel. Recheck blood sugar in 15 minutes after treatment (to make sure it is greater than 70 mg/dL). If your blood sugar is not greater than 70 mg/dL on recheck, call [phone] for further instructions. Report your blood sugar to the short stay nurse when you get to Short Stay.  If you are admitted to the hospital after surgery: Your blood sugar will be checked by the staff and you will probably be given insulin after surgery (instead of oral diabetes medicines) to make sure you have good blood sugar levels. The goal for blood sugar control after surgery is 80-180 mg/dL.        Do not wear jewelry or makeup. Do not wear lotions, powders, perfumes/cologne or  deodorant. Do not shave 48 hours prior to surgery.  Men may shave face and neck. Do not bring valuables to the hospital. Do not wear nail polish, gel polish, artificial nails, or any other type of covering on natural nails (fingers and toes) If you have artificial nails or gel coating that need to be removed by a nail salon, please have this removed prior to surgery. Artificial nails or gel coating  may interfere with anesthesia's ability to adequately monitor your vital signs.  [place] is not responsible for any belongings or valuables.    Do NOT Smoke (Tobacco/Vaping)  24 hours prior to your procedure  If you use a CPAP at night, you may bring your mask for your overnight stay.   Contacts, glasses, hearing aids, dentures or partials may not be worn into surgery, please bring cases for these belongings   For patients admitted to the hospital, discharge time will be determined by your treatment team.   Patients discharged the day of surgery will not be allowed to drive home, and someone needs to stay with them for 24 hours.   SURGICAL WAITING ROOM VISITATION Patients having surgery or a procedure may have no more than 2 support people in the waiting area - these visitors may rotate.   Children under the age of 40 must have an adult with them who is not the patient. If the patient needs to stay at the hospital during part of their recovery, the visitor guidelines for inpatient rooms apply. Pre-op nurse will coordinate an appropriate time for 1 support person to accompany patient in pre-op.  This support person may not rotate.   Please refer to the [facility] website for the visitor guidelines for Inpatients (after your surgery is over and you are in a regular room).    Special instructions:    Oral Hygiene is also important to reduce your risk of infection.  Remember - BRUSH YOUR TEETH THE MORNING OF SURGERY WITH YOUR REGULAR TOOTHPASTE   - Preparing For Surgery  Before surgery, you can play an important role. Because skin is not sterile, your skin needs to be as free of germs as possible. You can reduce the number of germs on your skin by washing with CHG (chlorahexidine gluconate) Soap before surgery.  CHG is an antiseptic cleaner which kills germs and bonds with the skin to continue killing germs even after washing.     Please do not use if you have an allergy  to CHG or antibacterial soaps. If your skin becomes reddened/irritated stop using the CHG.  Do not shave (including legs and underarms) for at least 48 hours prior to first CHG shower. It is OK to shave your face.  Please follow these instructions carefully.     Shower the NIGHT BEFORE SURGERY and the MORNING OF SURGERY with CHG Soap.   If you chose to wash your hair, wash your hair first as usual with your normal shampoo. After you shampoo, rinse your hair and body thoroughly to remove the shampoo.  Then [employer] and genitals (private parts) with your normal soap and rinse thoroughly to remove soap.  After that Use CHG Soap as you would any other liquid soap. You can apply CHG directly to the skin and wash gently with a scrungie or a clean washcloth.   Apply the CHG Soap to your body ONLY FROM THE NECK DOWN.  Do not use on open wounds or open sores. Avoid contact with your eyes, ears,  mouth and genitals (private parts). Wash Face and genitals (private parts)  with your normal soap.   Wash thoroughly, paying special attention to the area where your surgery will be performed.  Thoroughly rinse your body with warm water from the neck down.  DO NOT shower/wash with your normal soap after using and rinsing off the CHG Soap.  Pat yourself dry with a CLEAN TOWEL.  Wear CLEAN PAJAMAS to bed the night before surgery  Place CLEAN SHEETS on your bed the night before your surgery  DO NOT SLEEP WITH PETS.   Day of Surgery: Take a shower with CHG soap. Wear Clean/Comfortable clothing the morning of surgery Do not apply any deodorants/lotions.   Remember to brush your teeth WITH YOUR REGULAR TOOTHPASTE.    If you received a COVID test during your pre-op visit, it is requested that you wear a mask when out in public, stay away from anyone that may not be feeling well, and notify your surgeon if you develop symptoms. If you have been in contact with anyone that has tested positive in the last 10  days, please notify your surgeon.    Please read over the following fact sheets that you were given.

## 2022-06-18 ENCOUNTER — Encounter: Payer: Self-pay | Admitting: *Deleted

## 2022-06-18 NOTE — Progress Notes (Signed)
Anesthesia Chart Review:  Follows with cardiology for history of CAD s/p MI 1997 treated with angioplasty.  Cath Dec 2019 demonstrated moderate two-vessel coronary disease with 35% ostial left main, 90% small first obtuse marginal, severe second obtuse marginal and moderately severe mid RCA.  LAD was widely patent.  Medical therapy was pursued.  Patient seen by Christen Bame, NP [phone] via televisit for preop evaluation.  Per note, "Preoperative Cardiovascular Risk Assessment: He will need to undergo nuclear stress testing prior to surgical clearance based on recent symptoms of chest pain and inability to achieve > 4 METS activity. Patient is aware that clearance will not be given until test results are completed. Order placed for lexiscan myoview. I will route this to surgeon's office for their awareness.  -Given high risk for bleeding w this procedure, holding ASA for 7 days should be acceptable pending no evidence of ischemia on stress test."  Stress test 06/05/2022 was intermediate risk, consistent with prior myocardial infarction.  Christen Bame, NP commented on result stating, "Stress test had some concerning factors but has been reviewed by Dr. Tamala Julian in comparison to prior cardiac catheterization.  He has been cleared to proceed with surgery.  Would recommend that he follow-up with cardiology 1 to 2 months after surgery."  Non insulin dependent DM2, last A1c 7.8 on 04/28/2022.  Preop labs reviewed, unremarkable.   EKG 04/29/22: Sinus rhythm. Rate 80. Multiple ventricular premature complexes. Probable left atrial enlargement. Nonspecific T abnormalities, inferior leads. Baseline wander in lead(s) I III aVL V4  CTA chest/abd/pelvis 04/28/22: IMPRESSION: VASCULAR   1. No evidence of acute aortic dissection or aneurysm. 2. 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 The  popliteal artery then reconstitutes just inferior to the patella. 3. Left lower extremity runoff disease with single-vessel runoff to the ankle in the form of the peroneal artery. The posterior tibial artery is occluded proximally but then reconstitutes. 4. More mild peripheral arterial disease throughout the right lower extremity as detailed above. 5. Concentric hypertrophy of the left ventricular myocardium suggests systemic arterial hypertension. 6. Aortic and coronary artery atherosclerotic calcifications.   NON VASCULAR   1. No acute abnormality within the chest, abdomen or pelvis. 2. Diffuse mild thickening of the distal esophagus measuring up to 7 mm. Differential considerations include esophagitis related to GERD, infectious/inflammatory esophagitis, and less likely malignancy. 3. Adrenal hyperplasia. 4. 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 5. 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 6. 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  Nuclear stress 06/05/2022:   LV perfusion is abnormal. There is no evidence of ischemia. There is evidence of infarction. Defect 1: There is a medium defect with severe reduction in uptake present in the apical to basal inferior location(s) that is fixed. There is abnormal wall motion in the defect area. Consistent with infarction.   Left ventricular function is abnormal. Nuclear stress EF: 48 %. The left ventricular ejection fraction is mildly decreased (45-54%). End diastolic cavity size is normal. End systolic cavity size is normal.   Findings are consistent with prior myocardial infarction. The study is intermediate risk.   Cath 08/24/2018: Moderately severe two-vessel coronary artery disease. 30 to 40% ostial left main with minimal cross-sectional area 9.97 mm (significant less than 6 mm). Widely patent left anterior descending and  diagonal.  LAD stops  of the left ventricular apex. The circumflex is a large vessel that gives origin to 3 obtuse marginal branches.  The first marginal is tiny, with ostial 90% narrowing. 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  The larger third obtuse marginal is widely patent.  The mid circumflex beyond the second obtuse marginal contains eccentric 60% disease. The right coronary contains moderately severe diffuse disease from the proximal to distal segment and also involving the PDA. Left ventriculography reveals severe inferior and apical hypokinesis.  EF 40 to 45%.  Normal LVEDP   RECOMMENDATIONS:   Given relatively diffuse coronary disease and stable/chronic coronary presentation, appropriate management includes secondary risk prevention with LDL lowering and aggressive management of diabetes.  Consider SGLT2 therapy for diabetes in addition to agents already being used. Institute anti-ischemic therapy, will start low-dose beta-blocker therapy in the form of metoprolol succinate 25 mg/day and consider adding either long-acting nitrates or Ranexa if symptoms are not controlled. Detailed history reveals as some of the patient's chest discomfort is likely non-ischemic related with much of the pain being sharp stabbing quality discomfort.    Wynonia Musty [facility] Short Stay Center/Anesthesiology Phone [phone] 06/18/2022 1:31 PM

## 2022-06-18 NOTE — Anesthesia Preprocedure Evaluation (Addendum)
Anesthesia Evaluation  Patient identified by MRN, date of birth, ID band Patient awake    Reviewed: Allergy & Precautions, NPO status , Patient's Chart, lab work & pertinent test results  Airway Mallampati: II  TM Distance: >3 FB Neck ROM: Full    Dental no notable dental hx.    Pulmonary neg pulmonary ROS, former smoker,    Pulmonary exam normal breath sounds clear to auscultation       Cardiovascular hypertension, + CAD, + Past MI and + Peripheral Vascular Disease  Normal cardiovascular exam Rhythm:Regular Rate:Normal  LV perfusion is abnormal. There is no evidence of ischemia. There is evidence of infarction. Defect 1: There is a medium defect with severe reduction in uptake present in the apical to basal inferior location(s) that is fixed. There is abnormal wall motion in the defect area. Consistent with infarction. . Left ventricular function is abnormal. Nuclear stress EF: 48 %. The left ventricular ejection fraction is mildly decreased (45-54%). End diastolic cavity size is normal. End systolic cavity size is normal. . Findings are consistent with prior myocardial infarction. The study is intermediate risk.   Neuro/Psych negative neurological ROS  negative psych ROS   GI/Hepatic negative GI ROS, Neg liver ROS,   Endo/Other  diabetes, Type 2, Insulin Dependent  Renal/GU negative Renal ROS  negative genitourinary   Musculoskeletal negative musculoskeletal ROS (+)   Abdominal   Peds negative pediatric ROS (+)  Hematology negative hematology ROS (+)   Anesthesia Other Findings   Reproductive/Obstetrics negative OB ROS                            Anesthesia Physical Anesthesia Plan  ASA: 3  Anesthesia Plan: General   Post-op Pain Management:    Induction: Intravenous  PONV Risk Score and Plan: 2 and Ondansetron, Dexamethasone and Treatment may vary due to age or medical  condition  Airway Management Planned: Oral ETT  Additional Equipment: Arterial line  Intra-op Plan:   Post-operative Plan: Extubation in OR  Informed Consent: I have reviewed the patients History and Physical, chart, labs and discussed the procedure including the risks, benefits and alternatives for the proposed anesthesia with the patient or authorized representative who has indicated his/her understanding and acceptance.     Dental advisory given  Plan Discussed with: CRNA and Surgeon  Anesthesia Plan Comments: (PAT note by Karoline Caldwell, PA-C: Follows with cardiology for history of CAD s/p MI 1997 treated with angioplasty. Cath Dec 2019 demonstrated moderate two-vessel coronary disease with 35% ostial left main, 90% small first obtuse marginal, severe second obtuse marginal and moderately severe mid RCA. LAD was widely patent. Medical therapy was pursued.  Patient seen by Christen Bame, NP [phone] via televisit for preop evaluation.  Per note, "Preoperative Cardiovascular Risk Assessment: He will need to undergo nuclear stress testing prior to surgical clearance based on recent symptoms of chest pain and inability to achieve >4 METS activity. Patient is aware that clearance will not be given until test results are completed. Order placed for lexiscan myoview. I will route this to surgeon's office for their awareness. -Given high risk for bleeding w this procedure, holding ASA for 7 days should be acceptablepending no evidence of ischemia on stress test."  Stress test 06/05/2022 was intermediate risk, consistent with prior myocardial infarction.  Christen Bame, NP commented on result stating, "Stress test had some concerning factors but has been reviewed by Dr. Tamala Julian in comparison to prior cardiac  catheterization. He has been cleared to proceed with surgery. Would recommend that he follow-up with cardiology 1 to 2 months after surgery."  Non insulin dependent DM2, last A1c 7.8 on  04/28/2022.  Preop labs reviewed, unremarkable.   EKG 04/29/22: Sinus rhythm. Rate 80. Multiple ventricular premature complexes. Probable left atrial enlargement. Nonspecific T abnormalities, inferior leads. Baseline wander in lead(s) I III aVL V4  CTA chest/abd/pelvis 04/28/22: IMPRESSION: VASCULAR  1. No evidence of acute aortic dissection or aneurysm. 2. 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 The popliteal artery then reconstitutes just inferior to the patella. 3. Left lower extremity runoff disease with single-vessel runoff to the ankle in the form of the peroneal artery. The posterior tibial artery is occluded proximally but then reconstitutes. 4. More mild peripheral arterial disease throughout the right lower extremity as detailed above. 5. Concentric hypertrophy of the left ventricular myocardium suggests systemic arterial hypertension. 6. Aortic and coronary artery atherosclerotic calcifications.  NON VASCULAR  1. No acute abnormality within the chest, abdomen or pelvis. 2. Diffuse mild thickening of the distal esophagus measuring up to 7 mm. Differential considerations include esophagitis related to GERD, infectious/inflammatory esophagitis, and less likely malignancy. 3. Adrenal hyperplasia. 4. 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 5. 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 6. 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  Nuclear stress 06/05/2022: . LV perfusion is abnormal. There is no evidence of ischemia. There is evidence of infarction. Defect 1: There is a medium defect with severe reduction in uptake present in the apical to basal inferior location(s) that is fixed. There is abnormal wall motion in the defect area. Consistent with  infarction. . Left ventricular function is abnormal. Nuclear stress EF: 48 %. The left ventricular ejection fraction is mildly decreased (45-54%). End diastolic cavity size is normal. End systolic cavity size is normal. . Findings are consistent with prior myocardial infarction. The study is intermediate risk.  Cath 08/24/2018: . Moderately severe two-vessel coronary artery disease. . 30 to 40% ostial left main with minimal cross-sectional area 9.97 mm (significant less than 6 mm). . Widely patent left anterior descending and diagonal. LAD stops of the left ventricular apex. . The circumflex is a large vessel that gives origin to 3 obtuse marginal branches. The first marginal is tiny, with ostial 90% narrowing. . 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 The larger third obtuse marginal is widely patent. The mid circumflex beyond the second obtuse marginal contains eccentric 60% disease. . The right coronary contains moderately severe diffuse disease from the proximal to distal segment and also involving the PDA. Marland Kitchen Left ventriculography reveals severe inferior and apical hypokinesis. EF 40 to 45%. Normal LVEDP  RECOMMENDATIONS:  . Given relatively diffuse coronary disease and stable/chronic coronary presentation, appropriate management includes secondary risk prevention with LDL lowering and aggressive management of diabetes. Consider SGLT2 therapy for diabetes in addition to agents already being used. . Institute anti-ischemic therapy, will start low-dose beta-blocker therapy in the form of metoprolol succinate 25 mg/day and consider adding either long-acting nitrates or Ranexa if symptoms are not controlled. . Detailed history reveals as some of the patient's chest discomfort is likely non-ischemic related with much of the pain being sharp stabbing quality discomfort.  )  Anesthesia Quick  Evaluation

## 2022-06-23 ENCOUNTER — Inpatient Hospital Stay (HOSPITAL_COMMUNITY): Payer: HMO | Admitting: Anesthesiology

## 2022-06-23 ENCOUNTER — Inpatient Hospital Stay (HOSPITAL_COMMUNITY)
Admission: RE | Admit: 2022-06-23 | Discharge: 2022-06-26 | DRG: 253 | Disposition: A | Discharge status: Home / Self Care | Payer: HMO | Attending: Vascular Surgery | Admitting: Vascular Surgery

## 2022-06-23 ENCOUNTER — Inpatient Hospital Stay (HOSPITAL_COMMUNITY): Payer: HMO | Admitting: Physician Assistant

## 2022-06-23 ENCOUNTER — Other Ambulatory Visit: Payer: Self-pay

## 2022-06-23 ENCOUNTER — Encounter (HOSPITAL_COMMUNITY): Payer: Self-pay | Admitting: Vascular Surgery

## 2022-06-23 ENCOUNTER — Encounter (HOSPITAL_COMMUNITY)
Admission: RE | Disposition: A | Discharge status: Home / Self Care | Payer: Self-pay | Source: Home / Self Care | Attending: Vascular Surgery

## 2022-06-23 DIAGNOSIS — Z87891 Personal history of nicotine dependence: Secondary | ICD-10-CM | POA: Diagnosis not present

## 2022-06-23 DIAGNOSIS — K219 Gastro-esophageal reflux disease without esophagitis: Secondary | ICD-10-CM | POA: Diagnosis present

## 2022-06-23 DIAGNOSIS — J989 Respiratory disorder, unspecified: Secondary | ICD-10-CM | POA: Diagnosis not present

## 2022-06-23 DIAGNOSIS — I10 Essential (primary) hypertension: Secondary | ICD-10-CM

## 2022-06-23 DIAGNOSIS — I252 Old myocardial infarction: Secondary | ICD-10-CM | POA: Diagnosis not present

## 2022-06-23 DIAGNOSIS — I2510 Atherosclerotic heart disease of native coronary artery without angina pectoris: Secondary | ICD-10-CM | POA: Diagnosis not present

## 2022-06-23 DIAGNOSIS — Z803 Family history of malignant neoplasm of breast: Secondary | ICD-10-CM

## 2022-06-23 DIAGNOSIS — I739 Peripheral vascular disease, unspecified: Secondary | ICD-10-CM

## 2022-06-23 DIAGNOSIS — Z7982 Long term (current) use of aspirin: Secondary | ICD-10-CM | POA: Diagnosis not present

## 2022-06-23 DIAGNOSIS — E440 Moderate protein-calorie malnutrition: Secondary | ICD-10-CM | POA: Insufficient documentation

## 2022-06-23 DIAGNOSIS — E1143 Type 2 diabetes mellitus with diabetic autonomic (poly)neuropathy: Secondary | ICD-10-CM | POA: Diagnosis present

## 2022-06-23 DIAGNOSIS — E1151 Type 2 diabetes mellitus with diabetic peripheral angiopathy without gangrene: Secondary | ICD-10-CM | POA: Diagnosis not present

## 2022-06-23 DIAGNOSIS — Z794 Long term (current) use of insulin: Secondary | ICD-10-CM | POA: Diagnosis not present

## 2022-06-23 DIAGNOSIS — E785 Hyperlipidemia, unspecified: Secondary | ICD-10-CM | POA: Diagnosis present

## 2022-06-23 DIAGNOSIS — E1152 Type 2 diabetes mellitus with diabetic peripheral angiopathy with gangrene: Principal | ICD-10-CM | POA: Diagnosis present

## 2022-06-23 DIAGNOSIS — Z888 Allergy status to other drugs, medicaments and biological substances status: Secondary | ICD-10-CM | POA: Diagnosis not present

## 2022-06-23 DIAGNOSIS — Z801 Family history of malignant neoplasm of trachea, bronchus and lung: Secondary | ICD-10-CM

## 2022-06-23 DIAGNOSIS — K3184 Gastroparesis: Secondary | ICD-10-CM | POA: Diagnosis present

## 2022-06-23 DIAGNOSIS — R531 Weakness: Secondary | ICD-10-CM | POA: Diagnosis not present

## 2022-06-23 DIAGNOSIS — I70262 Atherosclerosis of native arteries of extremities with gangrene, left leg: Secondary | ICD-10-CM

## 2022-06-23 DIAGNOSIS — I96 Gangrene, not elsewhere classified: Secondary | ICD-10-CM | POA: Diagnosis not present

## 2022-06-23 DIAGNOSIS — E1169 Type 2 diabetes mellitus with other specified complication: Secondary | ICD-10-CM

## 2022-06-23 HISTORY — PX: FEMORAL-TIBIAL BYPASS GRAFT: SHX938

## 2022-06-23 LAB — GLUCOSE, CAPILLARY
Glucose-Capillary: 226 mg/dL — ABNORMAL HIGH (ref 70–99)
Glucose-Capillary: 193 mg/dL — ABNORMAL HIGH (ref 70–99)
Glucose-Capillary: 152 mg/dL — ABNORMAL HIGH (ref 70–99)
Glucose-Capillary: 251 mg/dL — ABNORMAL HIGH (ref 70–99)

## 2022-06-23 LAB — CBC
HCT: 39.4 % (ref 39.0–52.0)
Hemoglobin: 13.5 g/dL (ref 13.0–17.0)
MCH: 29 pg (ref 26.0–34.0)
MCHC: 34.3 g/dL (ref 30.0–36.0)
MCV: 84.5 fL (ref 80.0–100.0)
Platelets: 181 10*3/uL (ref 150–400)
RBC: 4.66 MIL/uL (ref 4.22–5.81)
RDW: 14.1 % (ref 11.5–15.5)
WBC: 10.4 10*3/uL (ref 4.0–10.5)
nRBC: 0 % (ref 0.0–0.2)

## 2022-06-23 SURGERY — CREATION, BYPASS, ARTERIAL, FEMORAL TO TIBIAL, USING GRAFT
Anesthesia: General | Site: Leg Upper | Laterality: Left

## 2022-06-23 MED ORDER — METFORMIN HCL ER 500 MG PO TB24
500.0000 mg | ORAL_TABLET | Freq: Two times a day (BID) | ORAL | Status: DC
  Administered 2022-06-24 – 2022-06-26 (×5): 500 mg via ORAL
  Filled 2022-06-23 (×5): qty 1

## 2022-06-23 MED ORDER — PAPAVERINE HCL 30 MG/ML IJ SOLN
INTRAMUSCULAR | Status: AC
  Filled 2022-06-23: qty 2

## 2022-06-23 MED ORDER — ALBUMIN HUMAN 5 % IV SOLN: INTRAVENOUS | Status: DC | PRN

## 2022-06-23 MED ORDER — CHLORHEXIDINE GLUCONATE 0.12 % MT SOLN: 15.0000 mL | Freq: Once | OROMUCOSAL | Status: AC

## 2022-06-23 MED ORDER — NITROGLYCERIN 0.4 MG SL SUBL: 0.4000 mg | SUBLINGUAL_TABLET | SUBLINGUAL | Status: DC | PRN

## 2022-06-23 MED ORDER — ONDANSETRON HCL 4 MG/2ML IJ SOLN
4.0000 mg | Freq: Four times a day (QID) | INTRAMUSCULAR | Status: DC | PRN
  Administered 2022-06-23 – 2022-06-25 (×5): 4 mg via INTRAVENOUS
  Filled 2022-06-23 (×5): qty 2

## 2022-06-23 MED ORDER — POLYETHYLENE GLYCOL 3350 17 G PO PACK: 17.0000 g | PACK | Freq: Every day | ORAL | Status: DC | PRN

## 2022-06-23 MED ORDER — PANTOPRAZOLE SODIUM 40 MG PO TBEC
40.0000 mg | DELAYED_RELEASE_TABLET | Freq: Two times a day (BID) | ORAL | Status: DC
  Administered 2022-06-23 – 2022-06-26 (×6): 40 mg via ORAL
  Filled 2022-06-23 (×6): qty 1

## 2022-06-23 MED ORDER — EPHEDRINE 5 MG/ML INJ
INTRAVENOUS | Status: AC
  Filled 2022-06-23: qty 5

## 2022-06-23 MED ORDER — ALUM & MAG HYDROXIDE-SIMETH 200-200-20 MG/5ML PO SUSP
15.0000 mL | ORAL | Status: DC | PRN
  Filled 2022-06-23: qty 30

## 2022-06-23 MED ORDER — INSULIN ASPART 100 UNIT/ML IJ SOLN
0.0000 [IU] | Freq: Three times a day (TID) | INTRAMUSCULAR | Status: DC
  Administered 2022-06-24: 1 [IU] via SUBCUTANEOUS
  Administered 2022-06-24: 3 [IU] via SUBCUTANEOUS
  Administered 2022-06-24: 2 [IU] via SUBCUTANEOUS
  Administered 2022-06-25: 1 [IU] via SUBCUTANEOUS
  Administered 2022-06-25: 2 [IU] via SUBCUTANEOUS

## 2022-06-23 MED ORDER — CEFAZOLIN SODIUM-DEXTROSE 2-4 GM/100ML-% IV SOLN
2.0000 g | INTRAVENOUS | Status: AC
  Administered 2022-06-23 (×2): 2 g via INTRAVENOUS

## 2022-06-23 MED ORDER — ROCURONIUM BROMIDE 10 MG/ML (PF) SYRINGE
PREFILLED_SYRINGE | INTRAVENOUS | Status: DC | PRN
  Administered 2022-06-23: 20 mg via INTRAVENOUS
  Administered 2022-06-23 (×2): 50 mg via INTRAVENOUS
  Administered 2022-06-23: 80 mg via INTRAVENOUS

## 2022-06-23 MED ORDER — METOPROLOL SUCCINATE ER 25 MG PO TB24
ORAL_TABLET | ORAL | Status: AC
  Filled 2022-06-23: qty 1

## 2022-06-23 MED ORDER — METOPROLOL SUCCINATE ER 25 MG PO TB24
25.0000 mg | ORAL_TABLET | Freq: Every day | ORAL | Status: DC
  Administered 2022-06-24 – 2022-06-26 (×3): 25 mg via ORAL
  Filled 2022-06-23 (×4): qty 1

## 2022-06-23 MED ORDER — HEPARIN 6000 UNIT IRRIGATION SOLUTION
Status: DC | PRN
  Administered 2022-06-23: 1

## 2022-06-23 MED ORDER — ACETAMINOPHEN 650 MG RE SUPP: 325.0000 mg | RECTAL | Status: DC | PRN

## 2022-06-23 MED ORDER — CEFAZOLIN SODIUM-DEXTROSE 2-4 GM/100ML-% IV SOLN
2.0000 g | Freq: Three times a day (TID) | INTRAVENOUS | Status: AC
  Administered 2022-06-23 – 2022-06-24 (×2): 2 g via INTRAVENOUS
  Filled 2022-06-23 (×2): qty 100

## 2022-06-23 MED ORDER — THROMBIN (RECOMBINANT) 20000 UNITS EX SOLR
CUTANEOUS | Status: AC
  Filled 2022-06-23: qty 20000

## 2022-06-23 MED ORDER — SERTRALINE HCL 50 MG PO TABS
50.0000 mg | ORAL_TABLET | Freq: Every day | ORAL | Status: DC
  Administered 2022-06-23 – 2022-06-26 (×4): 50 mg via ORAL
  Filled 2022-06-23 (×5): qty 1

## 2022-06-23 MED ORDER — HEPARIN (PORCINE) 25000 UT/250ML-% IV SOLN
400.0000 [IU]/h | INTRAVENOUS | Status: DC
  Administered 2022-06-23: 400 [IU]/h via INTRAVENOUS

## 2022-06-23 MED ORDER — FENTANYL CITRATE (PF) 250 MCG/5ML IJ SOLN
INTRAMUSCULAR | Status: DC | PRN
  Administered 2022-06-23 (×3): 50 ug via INTRAVENOUS
  Administered 2022-06-23: 100 ug via INTRAVENOUS
  Administered 2022-06-23 (×5): 50 ug via INTRAVENOUS

## 2022-06-23 MED ORDER — PHENYLEPHRINE HCL-NACL 20-0.9 MG/250ML-% IV SOLN
INTRAVENOUS | Status: DC | PRN
  Administered 2022-06-23: 50 ug/min via INTRAVENOUS

## 2022-06-23 MED ORDER — HYDRALAZINE HCL 20 MG/ML IJ SOLN
INTRAMUSCULAR | Status: AC
  Filled 2022-06-23: qty 1

## 2022-06-23 MED ORDER — HEPARIN 6000 UNIT IRRIGATION SOLUTION
Status: AC
  Filled 2022-06-23: qty 500

## 2022-06-23 MED ORDER — HEPARIN SODIUM (PORCINE) 1000 UNIT/ML IJ SOLN
INTRAMUSCULAR | Status: AC
  Filled 2022-06-23: qty 10

## 2022-06-23 MED ORDER — OXYCODONE HCL 5 MG PO TABS: 5.0000 mg | ORAL_TABLET | Freq: Once | ORAL | Status: DC | PRN

## 2022-06-23 MED ORDER — HYDROMORPHONE HCL 1 MG/ML IJ SOLN
0.2500 mg | INTRAMUSCULAR | Status: DC | PRN
  Administered 2022-06-23 (×3): 0.5 mg via INTRAVENOUS

## 2022-06-23 MED ORDER — ACETAMINOPHEN 325 MG PO TABS: 325.0000 mg | ORAL_TABLET | ORAL | Status: DC | PRN

## 2022-06-23 MED ORDER — METOPROLOL SUCCINATE ER 25 MG PO TB24
25.0000 mg | ORAL_TABLET | Freq: Every day | ORAL | Status: DC
  Administered 2022-06-23: 25 mg via ORAL

## 2022-06-23 MED ORDER — PAPAVERINE HCL 30 MG/ML IJ SOLN
INTRAMUSCULAR | Status: DC | PRN
  Administered 2022-06-23: 60 mg via INTRAVENOUS

## 2022-06-23 MED ORDER — CHLORHEXIDINE GLUCONATE CLOTH 2 % EX PADS: 6.0000 | MEDICATED_PAD | Freq: Once | CUTANEOUS | Status: DC

## 2022-06-23 MED ORDER — MIRTAZAPINE 15 MG PO TABS
15.0000 mg | ORAL_TABLET | Freq: Every day | ORAL | Status: DC
  Administered 2022-06-23 – 2022-06-25 (×3): 15 mg via ORAL
  Filled 2022-06-23 (×3): qty 1

## 2022-06-23 MED ORDER — SODIUM CHLORIDE 0.9 % IV SOLN: INTRAVENOUS | Status: DC

## 2022-06-23 MED ORDER — LACTATED RINGERS IV SOLN: INTRAVENOUS | Status: DC

## 2022-06-23 MED ORDER — PROTAMINE SULFATE 10 MG/ML IV SOLN
INTRAVENOUS | Status: DC | PRN
  Administered 2022-06-23: 40 mg via INTRAVENOUS

## 2022-06-23 MED ORDER — POTASSIUM CHLORIDE CRYS ER 20 MEQ PO TBCR: 20.0000 meq | EXTENDED_RELEASE_TABLET | Freq: Every day | ORAL | Status: DC | PRN

## 2022-06-23 MED ORDER — CHLORHEXIDINE GLUCONATE 0.12 % MT SOLN
OROMUCOSAL | Status: AC
  Administered 2022-06-23: 15 mL via OROMUCOSAL
  Filled 2022-06-23: qty 15

## 2022-06-23 MED ORDER — GLIMEPIRIDE 4 MG PO TABS
8.0000 mg | ORAL_TABLET | Freq: Every day | ORAL | Status: DC
  Administered 2022-06-24 – 2022-06-26 (×3): 8 mg via ORAL
  Filled 2022-06-23 (×3): qty 2

## 2022-06-23 MED ORDER — SODIUM CHLORIDE 0.9 % IV SOLN: 500.0000 mL | Freq: Once | INTRAVENOUS | Status: DC | PRN

## 2022-06-23 MED ORDER — EPHEDRINE SULFATE-NACL 50-0.9 MG/10ML-% IV SOSY
PREFILLED_SYRINGE | INTRAVENOUS | Status: DC | PRN
  Administered 2022-06-23: 10 mg via INTRAVENOUS

## 2022-06-23 MED ORDER — HYDROMORPHONE HCL 1 MG/ML IJ SOLN
INTRAMUSCULAR | Status: AC
  Filled 2022-06-23: qty 1

## 2022-06-23 MED ORDER — MIDAZOLAM HCL 2 MG/2ML IJ SOLN
INTRAMUSCULAR | Status: AC
  Filled 2022-06-23: qty 2

## 2022-06-23 MED ORDER — SUGAMMADEX SODIUM 200 MG/2ML IV SOLN
INTRAVENOUS | Status: DC | PRN
  Administered 2022-06-23: 200 mg via INTRAVENOUS

## 2022-06-23 MED ORDER — HEPARIN (PORCINE) 25000 UT/250ML-% IV SOLN
INTRAVENOUS | Status: AC
  Filled 2022-06-23: qty 250

## 2022-06-23 MED ORDER — MAGNESIUM SULFATE 2 GM/50ML IV SOLN: 2.0000 g | Freq: Every day | INTRAVENOUS | Status: DC | PRN

## 2022-06-23 MED ORDER — BISACODYL 10 MG RE SUPP: 10.0000 mg | Freq: Every day | RECTAL | Status: DC | PRN

## 2022-06-23 MED ORDER — FENTANYL CITRATE (PF) 250 MCG/5ML IJ SOLN
INTRAMUSCULAR | Status: AC
  Filled 2022-06-23: qty 5

## 2022-06-23 MED ORDER — METOCLOPRAMIDE HCL 5 MG PO TABS
5.0000 mg | ORAL_TABLET | Freq: Two times a day (BID) | ORAL | Status: DC
  Administered 2022-06-23: 5 mg via ORAL
  Filled 2022-06-23 (×3): qty 1

## 2022-06-23 MED ORDER — PHENYLEPHRINE 80 MCG/ML (10ML) SYRINGE FOR IV PUSH (FOR BLOOD PRESSURE SUPPORT)
PREFILLED_SYRINGE | INTRAVENOUS | Status: DC | PRN
  Administered 2022-06-23: 160 ug via INTRAVENOUS

## 2022-06-23 MED ORDER — DEXAMETHASONE SODIUM PHOSPHATE 10 MG/ML IJ SOLN
INTRAMUSCULAR | Status: DC | PRN
  Administered 2022-06-23: 10 mg via INTRAVENOUS

## 2022-06-23 MED ORDER — ASPIRIN 81 MG PO TBEC
81.0000 mg | DELAYED_RELEASE_TABLET | Freq: Every day | ORAL | Status: DC
  Administered 2022-06-23 – 2022-06-26 (×4): 81 mg via ORAL
  Filled 2022-06-23 (×4): qty 1

## 2022-06-23 MED ORDER — PROPOFOL 10 MG/ML IV BOLUS
INTRAVENOUS | Status: AC
  Filled 2022-06-23: qty 20

## 2022-06-23 MED ORDER — OXYCODONE HCL 5 MG/5ML PO SOLN: 5.0000 mg | Freq: Once | ORAL | Status: DC | PRN

## 2022-06-23 MED ORDER — ONDANSETRON HCL 4 MG/2ML IJ SOLN: 4.0000 mg | Freq: Once | INTRAMUSCULAR | Status: DC | PRN

## 2022-06-23 MED ORDER — HYDRALAZINE HCL 20 MG/ML IJ SOLN
10.0000 mg | Freq: Once | INTRAMUSCULAR | Status: AC
  Administered 2022-06-23: 10 mg via INTRAVENOUS

## 2022-06-23 MED ORDER — OXYCODONE-ACETAMINOPHEN 5-325 MG PO TABS
1.0000 | ORAL_TABLET | ORAL | Status: DC | PRN
  Administered 2022-06-23 – 2022-06-26 (×11): 2 via ORAL
  Filled 2022-06-23 (×11): qty 2

## 2022-06-23 MED ORDER — INSULIN ASPART 100 UNIT/ML IJ SOLN
INTRAMUSCULAR | Status: AC
  Filled 2022-06-23: qty 1

## 2022-06-23 MED ORDER — METOPROLOL TARTRATE 5 MG/5ML IV SOLN: 2.0000 mg | INTRAVENOUS | Status: DC | PRN

## 2022-06-23 MED ORDER — HYDRALAZINE HCL 20 MG/ML IJ SOLN
5.0000 mg | INTRAMUSCULAR | Status: DC | PRN
  Administered 2022-06-23: 5 mg via INTRAVENOUS
  Filled 2022-06-23: qty 1

## 2022-06-23 MED ORDER — MORPHINE SULFATE (PF) 2 MG/ML IV SOLN
2.0000 mg | INTRAVENOUS | Status: DC | PRN
  Administered 2022-06-23 – 2022-06-25 (×4): 2 mg via INTRAVENOUS
  Filled 2022-06-23 (×4): qty 1

## 2022-06-23 MED ORDER — LIDOCAINE 2% (20 MG/ML) 5 ML SYRINGE
INTRAMUSCULAR | Status: DC | PRN
  Administered 2022-06-23: 100 mg via INTRAVENOUS

## 2022-06-23 MED ORDER — ROCURONIUM BROMIDE 10 MG/ML (PF) SYRINGE
PREFILLED_SYRINGE | INTRAVENOUS | Status: AC
  Filled 2022-06-23: qty 10

## 2022-06-23 MED ORDER — LIDOCAINE 2% (20 MG/ML) 5 ML SYRINGE
INTRAMUSCULAR | Status: AC
  Filled 2022-06-23: qty 5

## 2022-06-23 MED ORDER — ROSUVASTATIN CALCIUM 5 MG PO TABS
5.0000 mg | ORAL_TABLET | Freq: Every day | ORAL | Status: DC
  Administered 2022-06-24 – 2022-06-26 (×3): 5 mg via ORAL
  Filled 2022-06-23 (×4): qty 1

## 2022-06-23 MED ORDER — ESMOLOL HCL 100 MG/10ML IV SOLN
INTRAVENOUS | Status: DC | PRN
  Administered 2022-06-23: 20 mg via INTRAVENOUS

## 2022-06-23 MED ORDER — ONDANSETRON HCL 4 MG/2ML IJ SOLN
INTRAMUSCULAR | Status: AC
  Filled 2022-06-23: qty 2

## 2022-06-23 MED ORDER — ONDANSETRON HCL 4 MG/2ML IJ SOLN
INTRAMUSCULAR | Status: DC | PRN
  Administered 2022-06-23: 4 mg via INTRAVENOUS

## 2022-06-23 MED ORDER — ORAL CARE MOUTH RINSE: 15.0000 mL | Freq: Once | OROMUCOSAL | Status: AC

## 2022-06-23 MED ORDER — CEFAZOLIN SODIUM-DEXTROSE 2-4 GM/100ML-% IV SOLN
INTRAVENOUS | Status: AC
  Filled 2022-06-23: qty 100

## 2022-06-23 MED ORDER — PHENOL 1.4 % MT LIQD: 1.0000 | OROMUCOSAL | Status: DC | PRN

## 2022-06-23 MED ORDER — DEXAMETHASONE SODIUM PHOSPHATE 10 MG/ML IJ SOLN
INTRAMUSCULAR | Status: AC
  Filled 2022-06-23: qty 1

## 2022-06-23 MED ORDER — LABETALOL HCL 5 MG/ML IV SOLN
10.0000 mg | INTRAVENOUS | Status: DC | PRN
  Administered 2022-06-23: 10 mg via INTRAVENOUS
  Filled 2022-06-23: qty 4

## 2022-06-23 MED ORDER — DOCUSATE SODIUM 100 MG PO CAPS
100.0000 mg | ORAL_CAPSULE | Freq: Every day | ORAL | Status: DC
  Administered 2022-06-24 – 2022-06-26 (×3): 100 mg via ORAL
  Filled 2022-06-23 (×3): qty 1

## 2022-06-23 MED ORDER — LINAGLIPTIN 5 MG PO TABS
5.0000 mg | ORAL_TABLET | Freq: Every day | ORAL | Status: DC
  Administered 2022-06-24 – 2022-06-26 (×3): 5 mg via ORAL
  Filled 2022-06-23 (×4): qty 1

## 2022-06-23 MED ORDER — INSULIN ASPART 100 UNIT/ML IJ SOLN
4.0000 [IU] | Freq: Once | INTRAMUSCULAR | Status: AC
  Administered 2022-06-23: 4 [IU] via SUBCUTANEOUS

## 2022-06-23 MED ORDER — HEPARIN SODIUM (PORCINE) 1000 UNIT/ML IJ SOLN
INTRAMUSCULAR | Status: DC | PRN
  Administered 2022-06-23: 9000 [IU] via INTRAVENOUS

## 2022-06-23 MED ORDER — PROPOFOL 10 MG/ML IV BOLUS
INTRAVENOUS | Status: DC | PRN
  Administered 2022-06-23: 120 mg via INTRAVENOUS

## 2022-06-23 MED ORDER — POLYVINYL ALCOHOL 1.4 % OP SOLN: 1.0000 [drp] | Freq: Three times a day (TID) | OPHTHALMIC | Status: DC | PRN

## 2022-06-23 MED ORDER — MIRABEGRON ER 25 MG PO TB24
25.0000 mg | ORAL_TABLET | Freq: Every day | ORAL | Status: DC
  Administered 2022-06-24 – 2022-06-26 (×3): 25 mg via ORAL
  Filled 2022-06-23 (×4): qty 1

## 2022-06-23 MED ORDER — MIDAZOLAM HCL 2 MG/2ML IJ SOLN
INTRAMUSCULAR | Status: DC | PRN
  Administered 2022-06-23: 2 mg via INTRAVENOUS

## 2022-06-23 MED ORDER — 0.9 % SODIUM CHLORIDE (POUR BTL) OPTIME
TOPICAL | Status: DC | PRN
  Administered 2022-06-23: 2000 mL

## 2022-06-23 SURGICAL SUPPLY — 66 items
ADH SKN CLS APL DERMABOND .7 (GAUZE/BANDAGES/DRESSINGS) ×5
BAG COUNTER SPONGE SURGICOUNT (BAG) ×2 IMPLANT
BAG ISL DRAPE 18X18 STRL (DRAPES) ×2
BAG ISOLATION DRAPE 18X18 (DRAPES) IMPLANT
BAG SPNG CNTER NS LX DISP (BAG) ×1
BANDAGE ESMARK 6X9 LF (GAUZE/BANDAGES/DRESSINGS) IMPLANT
BNDG CMPR 9X6 STRL LF SNTH (GAUZE/BANDAGES/DRESSINGS) ×1
BNDG ESMARK 6X9 LF (GAUZE/BANDAGES/DRESSINGS) ×1
CANISTER SUCT 3000ML PPV (MISCELLANEOUS) ×2 IMPLANT
CANNULA VESSEL 3MM 2 BLNT TIP (CANNULA) ×4 IMPLANT
CLIP RETRACTION 3.0MM CORONARY (MISCELLANEOUS) IMPLANT
CLIP TI WIDE RED SMALL 24 (CLIP) IMPLANT
CLIP VESOCCLUDE MED 24/CT (CLIP) ×2 IMPLANT
CLIP VESOCCLUDE SM WIDE 24/CT (CLIP) ×2 IMPLANT
COVER PROBE W GEL 5X96 (DRAPES) IMPLANT
CUFF TOURN SGL QUICK 18X4 (TOURNIQUET CUFF) IMPLANT
CUFF TOURN SGL QUICK 24 (TOURNIQUET CUFF) ×1
CUFF TOURN SGL QUICK 34 (TOURNIQUET CUFF)
CUFF TOURN SGL QUICK 42 (TOURNIQUET CUFF) IMPLANT
CUFF TRNQT CYL 24X4X16.5-23 (TOURNIQUET CUFF) IMPLANT
CUFF TRNQT CYL 34X4.125X (TOURNIQUET CUFF) IMPLANT
DERMABOND ADVANCED .7 DNX12 (GAUZE/BANDAGES/DRESSINGS) ×2 IMPLANT
DRAIN CHANNEL 15F RND FF W/TCR (WOUND CARE) IMPLANT
DRAPE HALF SHEET 40X57 (DRAPES) IMPLANT
DRAPE ISOLATION BAG 18X18 (DRAPES) ×2
DRAPE X-RAY CASS 24X20 (DRAPES) IMPLANT
ELECT REM PT RETURN 9FT ADLT (ELECTROSURGICAL) ×1
ELECTRODE REM PT RTRN 9FT ADLT (ELECTROSURGICAL) ×2 IMPLANT
EVACUATOR SILICONE 100CC (DRAIN) IMPLANT
GLOVE BIO SURGEON STRL SZ 6.5 (GLOVE) IMPLANT
GLOVE BIO SURGEON STRL SZ7.5 (GLOVE) ×2 IMPLANT
GLOVE BIOGEL PI IND STRL 6.5 (GLOVE) IMPLANT
GLOVE BIOGEL PI IND STRL 8 (GLOVE) ×2 IMPLANT
GLOVE SURG POLY ORTHO LF SZ7.5 (GLOVE) IMPLANT
GLOVE SURG UNDER LTX SZ8 (GLOVE) ×2 IMPLANT
GOWN STRL REUS W/ TWL LRG LVL3 (GOWN DISPOSABLE) ×6 IMPLANT
GOWN STRL REUS W/TWL LRG LVL3 (GOWN DISPOSABLE) ×3
KIT BASIN OR (CUSTOM PROCEDURE TRAY) ×2 IMPLANT
KIT TURNOVER KIT B (KITS) ×2 IMPLANT
LOOP VESSEL MAXI BLUE (MISCELLANEOUS) IMPLANT
MARKER GRAFT CORONARY BYPASS (MISCELLANEOUS) IMPLANT
NS IRRIG 1000ML POUR BTL (IV SOLUTION) ×4 IMPLANT
PACK PERIPHERAL VASCULAR (CUSTOM PROCEDURE TRAY) ×2 IMPLANT
PAD ARMBOARD 7.5X6 YLW CONV (MISCELLANEOUS) ×4 IMPLANT
SET COLLECT BLD 21X3/4 12 (NEEDLE) IMPLANT
SPONGE SURGIFOAM ABS GEL 100 (HEMOSTASIS) IMPLANT
STOPCOCK 4 WAY LG BORE MALE ST (IV SETS) IMPLANT
SUT ETHIBOND 5 LR DA (SUTURE) IMPLANT
SUT ETHILON 3 0 PS 1 (SUTURE) ×2 IMPLANT
SUT MNCRL AB 4-0 PS2 18 (SUTURE) ×4 IMPLANT
SUT PROLENE 5 0 C 1 24 (SUTURE) ×2 IMPLANT
SUT PROLENE 5 0 C 1 36 (SUTURE) IMPLANT
SUT PROLENE 6 0 BV (SUTURE) ×2 IMPLANT
SUT SILK 2 0 PERMA HAND 18 BK (SUTURE) IMPLANT
SUT SILK 2 0 SH (SUTURE) IMPLANT
SUT SILK 3 0 (SUTURE) ×2
SUT SILK 3-0 18XBRD TIE 12 (SUTURE) IMPLANT
SUT VIC AB 2-0 CTB1 (SUTURE) ×4 IMPLANT
SUT VIC AB 3-0 SH 27 (SUTURE) ×4
SUT VIC AB 3-0 SH 27X BRD (SUTURE) ×4 IMPLANT
SYR 20CC LL (SYRINGE) IMPLANT
TOWEL GREEN STERILE (TOWEL DISPOSABLE) ×2 IMPLANT
TRAY FOLEY MTR SLVR 16FR STAT (SET/KITS/TRAYS/PACK) ×2 IMPLANT
TUBING EXTENTION W/L.L. (IV SETS) IMPLANT
UNDERPAD 30X36 HEAVY ABSORB (UNDERPADS AND DIAPERS) ×2 IMPLANT
WATER STERILE IRR 1000ML POUR (IV SOLUTION) ×2 IMPLANT

## 2022-06-23 NOTE — Anesthesia Procedure Notes (Signed)
Procedure Name: Intubation Date/Time: 06/23/2022 8:52 AM  Performed by: Lance Coon, CRNAPre-anesthesia Checklist: Patient identified, Emergency Drugs available, Suction available, Patient being monitored and Timeout performed Patient Re-evaluated:Patient Re-evaluated prior to induction Oxygen Delivery Method: Circle system utilized Preoxygenation: Pre-oxygenation with 100% oxygen Induction Type: IV induction Ventilation: Mask ventilation without difficulty Laryngoscope Size: Miller and 3 Grade View: Grade I Tube type: Oral Tube size: 7.5 mm Number of attempts: 1 Airway Equipment and Method: Stylet Placement Confirmation: ETT inserted through vocal cords under direct vision, positive ETCO2 and breath sounds checked- equal and bilateral Secured at: 22 cm Tube secured with: Tape Dental Injury: Teeth and Oropharynx as per pre-operative assessment

## 2022-06-23 NOTE — Progress Notes (Signed)
   VASCULAR SURGERY POSTOP:   Doing well post op. Brisk PT and per signal with the doppler Incisions look fine.  SUBJECTIVE:   Pain well controlled  PHYSICAL EXAM:   Vitals:   06/23/22 1400 06/23/22 1415 06/23/22 1430 06/23/22 1456  BP: (!) 177/105 (!) 170/95 (!) 175/84 (!) 161/105  Pulse: 83 79 81 77  Resp: '14 13 11 19  '$ Temp:   98.2 F (36.8 C) 97.6 F (36.4 C)  TempSrc:    Oral  SpO2: 100% 98% 98% 99%  Weight:      Height:       Brisk PT and per signal with the doppler Incisions look good  LABS:   Lab Results  Component Value Date   WBC 10.4 06/23/2022   HGB 13.5 06/23/2022   HCT 39.4 06/23/2022   MCV 84.5 06/23/2022   PLT 181 06/23/2022   CBG (last 3)  Recent Labs    06/23/22 0619 06/23/22 0820 06/23/22 1314  GLUCAP 226* 193* 152*    PROBLEM LIST:    Principal Problem:   PAD (peripheral artery disease) (HCC)   CURRENT MEDS:    aspirin EC  81 mg Oral Daily   [START ON 06/24/2022] docusate sodium  100 mg Oral Daily   [START ON 06/24/2022] glimepiride  8 mg Oral QAC breakfast   hydrALAZINE       HYDROmorphone       HYDROmorphone       insulin aspart       insulin aspart  0-9 Units Subcutaneous TID WC   linagliptin  5 mg Oral Daily   [START ON 06/24/2022] metFORMIN  500 mg Oral BID WC   metoCLOPramide  5 mg Oral BID AC   metoprolol succinate  25 mg Oral Daily   mirabegron ER  25 mg Oral Daily   mirtazapine  15 mg Oral QHS   pantoprazole  40 mg Oral BID   rosuvastatin  5 mg Oral Daily   sertraline  50 mg Oral Daily    Deitra Mayo Office: [phone] 06/23/2022

## 2022-06-23 NOTE — Anesthesia Procedure Notes (Signed)
Arterial Line Insertion Start/End10/11/2021 7:05 AM, 06/23/2022 7:15 AM Performed by: Lance Coon, CRNA, CRNA  Patient location: Pre-op. Preanesthetic checklist: patient identified, IV checked, site marked, risks and benefits discussed, surgical consent, monitors and equipment checked, pre-op evaluation, timeout performed and anesthesia consent Lidocaine 1% used for infiltration Left, radial was placed Catheter size: 20 G Hand hygiene performed  and maximum sterile barriers used   Attempts: 1 Procedure performed without using ultrasound guided technique. Following insertion, dressing applied and Biopatch. Post procedure assessment: normal and unchanged  Patient tolerated the procedure well with no immediate complications.

## 2022-06-23 NOTE — Progress Notes (Signed)
06/23/2022 1450 Received pt to room 4E-25 from PACU.  Pt without C/O.  Tele monitor applied and CCMD notified.  CHG bath given.  Oriented to room, call light and bed.  Call bell in reach, family at bedside. [place]

## 2022-06-23 NOTE — Op Note (Signed)
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 Scot Dock, MD  ASSIST: Leontine Locket, PA  ANESTHESIA: General  EBL: 100 cc  INDICATIONS:    Justin Key is a 71 y.o. 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  He had evidence of severe infrainguinal arterial occlusive disease on exam.  He underwent arteriography which showed a flush occlusion of the superficial femoral artery with reconstitution of the below-knee popliteal artery.  He had single-vessel runoff via the peroneal artery with disease proximally.  He then reconstituted the posterior tibial artery.  Vein map preoperatively showed a marginal size vein below the knee on the left.  I looked at the right great saphenous vein myself with the SonoSite and this was not adequate below the mid thigh.  Of note the wound on the left toe has been relatively stable over the last 2 months and therefore I felt it would be reasonable to not go all the way to the posterior tibial artery if we did not have adequate vein to do on all autogenous bypass.  FINDINGS:   Given that I was not able to do an autogenous bypass to the posterior tibial artery I elected to do a left femoral to below-knee popliteal artery bypass as there was adequate vein for this.  Distally the vein was about 3.5 mm in diameter.  At the completion it was a good posterior tibial signal with the Doppler and peroneal signal.  TECHNIQUE:   The patient was taken to the operating room and received a general anesthetic.  I looked at both great saphenous veins myself with the SonoSite.  On the left side the vein became narrow below the  knee.  On the right side the vein became small at the junction of the proximal and mid thigh.  Both lower extremities were prepped and draped in usual sterile fashion.  Longitudinal incision was made in the left groin and the dissection carried down to the common femoral artery which was controlled with a vessel loop.  The deep femoral artery and superficial femoral arteries were controlled with Vesseloops.  Through this incision the saphenofemoral junction was dissected free.  Using 4 additional incisions along the medial aspect of the left leg, leaving skin bridges, the saphenous vein was harvested to the mid calf.  Branches were divided between clips and 3-0 silk ties.  At the mid calf level the vein branched again and became quite small.  Through the distal incision the below-knee popliteal artery was exposed.  There was a soft spot for the distal anastomosis.  The tunnel was created from this incision of the groin incision and the patient was then heparinized.  The vein was ligated distally and irrigated up with heparinized saline.  The last 5 cm of the vein was quite small and did not dilate up well but I felt that I had just enough vein to do a bypass to the below-knee popliteal artery.  Even taking vein out of both legs there would not be adequate length to get to the posterior tibial artery and rather than do a composite prosthetic vein bypass I felt this  was the best option.  The vein was spatulated proximally and the proximal valve was excised.  The common femoral, deep femoral, and superficial femoral arteries were controlled.  A longitudinal arteriotomy is made in the common femoral artery.  Of note the proximal aspect of the superficial femoral artery was patent so he does have an endovascular option in the future potentially.  The vein was sewn into side to the common femoral artery in a nonreversed fashion with continuous 5-0 Prolene suture.  Prior to completing anastomosis the artery was  backbled and flushed appropriately and the anastomosis completed.  There was excellent inflow.  The vein was irrigated again and then using a retrograde Mills valvulotome the valves were lysed and we established excellent flow through the vein which was then flushed with heparinized saline and clipped.  It was then marked to prevent twisting.  It was then brought to the previously created tunnel for anastomosis to the below-knee popliteal artery.  A tourniquet was placed on the thigh.  The leg was exsanguinated with an Esmarch bandage and the tourniquet inflated to 300 mmHg.  Under tourniquet control, a longitudinal arteriotomy was made in the below-knee popliteal artery.  The vein was cut to the appropriate length spatulated and sewn into side to the below-knee popliteal artery using a continuous 6-0 Prolene suture.  Of note I was able to excise the distal segment of the vein where he had become quite narrow.  At the completion there was an excellent pulse distal to the patch.  I was easily able to pass a 3-1/2 mm dilator through the distal anastomosis.  There was a good posterior tibial and peroneal signal with the Doppler at the completion.  The heparin was partially reversed with protamine.  The vein harvest incisions were irrigated and hemostasis obtained.  These were closed with a deep layer of 3-0 Vicryl and the skin closed with 4-0 Monocryl.  The below the knee incision was closed with a deep layer of 3-0 Vicryl and the skin closed with 4-0 Monocryl.  Hemostasis was obtained in the groin wound and then this was closed with 2 deep layers of 2-0 Vicryl, a subcutaneous layer with 3-0 Vicryl and the skin closed with 4-0 Monocryl.  Dermabond was applied.  The patient tolerated the procedure well was transferred to the recovery room in stable condition.  All needle and sponge counts were correct.  Given the complexity of the case,  the assistant was necessary in order to expedient the procedure and safely  perform the technical aspects of the operation.  The assistant provided traction and countertraction to assist with exposure of the artery proximally and distally.  They assisted with suture ligature of multiple branches.  Their assistance was critical in the performance of both the proximal and distal anastomosis.These skills, especially following the Prolene suture for the anastomosis, could not have been adequately performed by a scrub tech assistant.    Deitra Mayo, MD, FACS [facility] DICTATION:   06/23/2022

## 2022-06-23 NOTE — Progress Notes (Signed)
Rechecked CBG 193. Reported to Dr. Kalman Shan. No new orders.

## 2022-06-23 NOTE — Transfer of Care (Signed)
Immediate Anesthesia Transfer of Care Note  Patient: Justin Key  Procedure(s) Performed: BYPASS GRAFT FEMORAL-TIBIAL ARTERY (Left: Leg Upper)  Patient Location: PACU  Anesthesia Type:General  Level of Consciousness: drowsy and patient cooperative  Airway & Oxygen Therapy: Patient Spontanous Breathing  Post-op Assessment: Report given to RN and Post -op Vital signs reviewed and stable  Post vital signs: Reviewed and stable  Last Vitals:  Vitals Value Taken Time  BP 161/88 06/23/22 1315  Temp    Pulse 89 06/23/22 1316  Resp 18 06/23/22 1316  SpO2 94 % 06/23/22 1316  Vitals shown include unvalidated device data.  Last Pain:  Vitals:   06/23/22 0623  TempSrc: Oral  PainSc: 5          Complications: No notable events documented.

## 2022-06-23 NOTE — H&P (Addendum)
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  He has no significant inflow disease on the left.  He has a flush occlusion of the superficial femoral artery with reconstitution of the posterior tibial artery just above the ankle.  The artery is small.  I think his only chance for limb salvage is attempted revascularization.  The vein on the left is small and I will look at his vein on the right also with the SonoSite.  If possible we will do an autogenous bypass.  Otherwise he would require a composite graft with PTFE and vein.  I will explore the posterior tibial artery first to be sure that it is bypassable.  I think without revascularization he will require below-knee or above-knee amputation.  I have reviewed the indications for lower extremity bypass. I have also reviewed the potential complications of surgery including but not limited to: wound healing problems, infection, graft thrombosis, limb loss, or other unpredictable medical problems. All the patient's questions were answered and they are agreeable to proceed.  REASON FOR HOSPITALIZATION:    For left lower extremity bypass.  HPI:   Justin Key is a 71 y.o. male who I saw on 05/14/2022 with gangrene of the left great toe which began in early August.  He had presented to the emergency department on 04/28/2022 with chest pain.  His symptoms resolved with medical management.  During that admission he had a CT angio of the chest abdomen pelvis with runoff which showed severe infrainguinal arterial occlusive disease.  His activity is fairly limited.  He denies any significant rest pain.  His risk factors for peripheral arterial disease include type 2 diabetes, hypertension, hypercholesterolemia, and a remote history of tobacco use.  He is on aspirin but has an intolerance to statins.  Patient  has a history of moderate to severe multivessel coronary artery disease and was sent for preoperative cardiac evaluation. He underwent a nuclear stress test. LV perfusion is abnormal. No evidence of ischemia. Intermediate risk study.    Past Medical History:  Diagnosis Date   Adenomatous polyps    Back pain    Barrett's esophagus    Coronary artery disease    Depression    Diabetes mellitus without complication (HCC)    Failure to thrive in adult    Gastroparesis    GERD (gastroesophageal reflux disease)    Hyperlipidemia    Hypertension    Lumbar disc disease    Lumbar spinal stenosis    Myocardial infarction [facility]) 1997   Thoracic myelopathy 2020    Family History  Problem Relation Age of Onset   Breast cancer Mother    Lung cancer Father    Liver disease Brother    Colon cancer Neg Hx     SOCIAL HISTORY: Social History   Tobacco Use   Smoking status: Former    Packs/day: 1.00    Years: 20.00    Total pack years: 20.00    Types: Cigarettes    Quit date: 04/09/2012    Years since quitting: 10.2   Smokeless tobacco: Never   Tobacco comments:    smoking THC for pain/appetite   Substance Use Topics   Alcohol use: Yes    Comment: Maybe 1 glass of wine once a month, socially    Allergies  Allergen Reactions   Humibid La [Guaifenesin] Other (See Comments)  hallucinations    Current Facility-Administered Medications  Medication Dose Route Frequency Provider Last Rate Last Admin   0.9 %  sodium chloride infusion   Intravenous Continuous Angelia Mould, MD       ceFAZolin (ANCEF) 2-4 GM/100ML-% IVPB            ceFAZolin (ANCEF) IVPB 2g/100 mL premix  2 g Intravenous 30 min Pre-Op Angelia Mould, MD       Chlorhexidine Gluconate Cloth 2 % PADS 6 each  6 each Topical Once Angelia Mould, MD       And   Chlorhexidine Gluconate Cloth 2 % PADS 6 each  6 each Topical Once Angelia Mould, MD       insulin aspart (novoLOG) injection 4  Units  4 Units Subcutaneous Once Myrtie Soman, MD       lactated ringers infusion   Intravenous Continuous Myrtie Soman, MD       metoprolol succinate (TOPROL-XL) 24 hr tablet 25 mg  25 mg Oral Daily Woodrum, Chelsey L, MD   25 mg at 06/23/22 7026   metoprolol succinate (TOPROL-XL) 25 MG 24 hr tablet             REVIEW OF SYSTEMS:  '[X]'$  denotes positive finding, '[ ]'$  denotes negative finding Cardiac  Comments:  Chest pain or chest pressure:    Shortness of breath upon exertion:    Short of breath when lying flat:    Irregular heart rhythm:        Vascular    Pain in calf, thigh, or hip brought on by ambulation:    Pain in feet at night that wakes you up from your sleep:     Blood clot in your veins:    Leg swelling:         Pulmonary    Oxygen at home:    Productive cough:     Wheezing:         Neurologic    Sudden weakness in arms or legs:     Sudden numbness in arms or legs:     Sudden onset of difficulty speaking or slurred speech:    Temporary loss of vision in one eye:     Problems with dizziness:         Gastrointestinal    Blood in stool:     Vomited blood:         Genitourinary    Burning when urinating:     Blood in urine:        Psychiatric    Major depression:         Hematologic    Bleeding problems:    Problems with blood clotting too easily:        Skin    Rashes or ulcers:        Constitutional    Fever or chills:    -  PHYSICAL EXAM:   Vitals:   06/23/22 0623  BP: 131/82  Pulse: 92  Resp: 18  Temp: 98.9 F (37.2 C)  TempSrc: Oral  SpO2: 99%  Weight: 89.4 kg  Height: '6\' 3"'$  (1.905 m)   Body mass index is 24.62 kg/m. GENERAL: The patient is a well-nourished male, in no acute distress. The vital signs are documented above. CARDIAC: There is a regular rate and rhythm.  VASCULAR: I do not detect carotid bruits. He has palpable femoral pulses. I cannot palpate pedal pulses. PULMONARY: There is good air exchange bilaterally without  wheezing  or rales. ABDOMEN: Soft and non-tender with normal pitched bowel sounds.  MUSCULOSKELETAL: There are no major deformities. NEUROLOGIC: No focal weakness or paresthesias are detected. SKIN: He has gangrene of the left great toe.     PSYCHIATRIC: The patient has a normal affect.  DATA:    Vein map: Left GSV gets small in mid thigh.   Crt = 1.24 GFR > [facility]

## 2022-06-23 NOTE — Anesthesia Postprocedure Evaluation (Signed)
Anesthesia Post Note  Patient: Justin Key  Procedure(s) Performed: BYPASS GRAFT FEMORAL-TIBIAL ARTERY (Left: Leg Upper)     Patient location during evaluation: PACU Anesthesia Type: General Level of consciousness: awake and alert Pain management: pain level controlled Vital Signs Assessment: post-procedure vital signs reviewed and stable Respiratory status: spontaneous breathing, nonlabored ventilation, respiratory function stable and patient connected to nasal cannula oxygen Cardiovascular status: blood pressure returned to baseline and stable Postop Assessment: no apparent nausea or vomiting Anesthetic complications: no   No notable events documented.  Last Vitals:  Vitals:   06/23/22 1430 06/23/22 1456  BP: (!) 175/84 (!) 161/105  Pulse: 81 77  Resp: 11 19  Temp: 36.8 C 36.4 C  SpO2: 98% 99%    Last Pain:  Vitals:   06/23/22 1456  TempSrc: Oral  PainSc:                  [name] S

## 2022-06-24 ENCOUNTER — Encounter (HOSPITAL_COMMUNITY): Payer: Self-pay | Admitting: Vascular Surgery

## 2022-06-24 LAB — GLUCOSE, CAPILLARY
Glucose-Capillary: 150 mg/dL — ABNORMAL HIGH (ref 70–99)
Glucose-Capillary: 151 mg/dL — ABNORMAL HIGH (ref 70–99)
Glucose-Capillary: 212 mg/dL — ABNORMAL HIGH (ref 70–99)
Glucose-Capillary: 72 mg/dL (ref 70–99)

## 2022-06-24 LAB — CBC
HCT: 35 % — ABNORMAL LOW (ref 39.0–52.0)
Hemoglobin: 12 g/dL — ABNORMAL LOW (ref 13.0–17.0)
MCH: 29.3 pg (ref 26.0–34.0)
MCHC: 34.3 g/dL (ref 30.0–36.0)
MCV: 85.4 fL (ref 80.0–100.0)
Platelets: 154 10*3/uL (ref 150–400)
RBC: 4.1 MIL/uL — ABNORMAL LOW (ref 4.22–5.81)
RDW: 14.1 % (ref 11.5–15.5)
WBC: 6.7 10*3/uL (ref 4.0–10.5)
nRBC: 0 % (ref 0.0–0.2)

## 2022-06-24 LAB — LIPID PANEL
Cholesterol: 218 mg/dL — ABNORMAL HIGH (ref 0–200)
HDL: 52 mg/dL (ref >40)
LDL Cholesterol: 146 mg/dL — ABNORMAL HIGH (ref 0–99)
Total CHOL/HDL Ratio: 4.2 RATIO
Triglycerides: 100 mg/dL (ref <150)
VLDL: 20 mg/dL (ref 0–40)

## 2022-06-24 LAB — BASIC METABOLIC PANEL
Anion gap: 11 (ref 5–15)
BUN: 14 mg/dL (ref 8–23)
CO2: 22 mmol/L (ref 22–32)
Calcium: 9 mg/dL (ref 8.9–10.3)
Chloride: 105 mmol/L (ref 98–111)
Creatinine, Ser: 0.99 mg/dL (ref 0.61–1.24)
GFR, Estimated: >60 mL/min (ref >60)
Glucose, Bld: 150 mg/dL — ABNORMAL HIGH (ref 70–99)
Potassium: 3.2 mmol/L — ABNORMAL LOW (ref 3.5–5.1)
Sodium: 138 mmol/L (ref 135–145)

## 2022-06-24 LAB — APTT: aPTT: 30 seconds (ref 24–36)

## 2022-06-24 MED ORDER — METOCLOPRAMIDE HCL 5 MG/ML IJ SOLN
10.0000 mg | Freq: Three times a day (TID) | INTRAMUSCULAR | Status: DC
  Administered 2022-06-24 – 2022-06-26 (×7): 10 mg via INTRAVENOUS
  Filled 2022-06-24 (×7): qty 2

## 2022-06-24 NOTE — Progress Notes (Signed)
Patient is refusing to have foley catheter removed due to his urinary incontinence. Patient made aware of hospital protocol and risk for infection, continues to insist foley needs to be remain.

## 2022-06-24 NOTE — Inpatient Diabetes Management (Signed)
Inpatient Diabetes Program Recommendations  AACE/ADA: New Consensus Statement on Inpatient Glycemic Control (2015)  Target Ranges:  Prepandial:   less than 140 mg/dL      Peak postprandial:   less than 180 mg/dL (1-2 hours)      Critically ill patients:  140 - 180 mg/dL   Lab Results  Component Value Date   GLUCAP 150 (H) 06/24/2022   HGBA1C 7.8 (H) 04/28/2022    Review of Glycemic Control  Diabetes history: DM2 Outpatient Diabetes medications:  Tresiba 8 units QHS, Amaryl 8 mg QD, Jardiance 10 mg QD, Januvia 10 mg QD, metformin 500 mg BID Current orders for Inpatient glycemic control:  Novolog 0-9 units TID, Tradjenta 5 mg QD, Amaryl 8 mg QD, metformin 500 BID  HgbA1C - 7.8%, acceptable POD#1  Inpatient Diabetes Program Recommendations:    Consider adding Semglee 4 units QHS (1/2 home dose of Tresiba)  Follow glucose trends.   Thank you. Lorenda Peck, RD, LDN, [place] Inpatient Diabetes Coordinator [phone]

## 2022-06-24 NOTE — Evaluation (Signed)
Occupational Therapy Evaluation Patient Details Name: Justin Key MRN: [id number] DOB: 1951-04-27 Today's Date: 06/24/2022   History of Present Illness Pt is a 71 y.o. male admitted 06/23/22 for same day L common femoral artery to below-knee popliteal bypass graft. PMH includes CAD, PVD, DM, HTN, HLD, GERD, chronic back pain.   Clinical Impression   Pt currently min assist for LB selfcare tasks, toileting, toilet transfers with use of the RW for support.  Feel he will benefit from acute care OT to address current selfcare dependence and help regain function to at least a supervision level for discharge.  Will continue to follow in acute care.      Recommendations for follow up therapy are one component of a multi-disciplinary discharge planning process, led by the attending physician.  Recommendations may be updated based on patient status, additional functional criteria and insurance authorization.   Follow Up Recommendations  No OT follow up    Assistance Recommended at Discharge PRN  Patient can return home with the following A little help with bathing/dressing/bathroom;Assistance with cooking/housework;A little help with walking and/or transfers    Functional Status Assessment  Patient has had a recent decline in their functional status and demonstrates the ability to make significant improvements in function in a reasonable and predictable amount of time.  Equipment Recommendations  BSC/3in1       Precautions / Restrictions Precautions Precautions: Fall;Other (comment) Precaution Comments: h/o L foot drop (wears AFO, does not have at hospital) Restrictions Weight Bearing Restrictions: No      Mobility Bed Mobility Overal bed mobility: Needs Assistance Bed Mobility: Sit to Supine       Sit to supine: Supervision        Transfers Overall transfer level: Needs assistance Equipment used: Rolling walker (2 wheels) Transfers: Sit to/from Stand Sit to Stand: Min  assist           General transfer comment: Min assist for sit to stand from the regular toilet with rail and also from the bedside recliner.      Balance Overall balance assessment: Needs assistance   Sitting balance-Leahy Scale: Fair Sitting balance - Comments: maintained balance while working on dressing   Standing balance support: Bilateral upper extremity supported, During functional activity, Reliant on assistive device for balance Standing balance-Leahy Scale: Poor Standing balance comment: needs UE support for balance with mobility                           ADL either performed or assessed with clinical judgement   ADL Overall ADL's : Needs assistance/impaired Eating/Feeding: Set up;Sitting   Grooming: Set up;Sitting   Upper Body Bathing: Supervision/ safety;Sitting   Lower Body Bathing: Minimal assistance;Sit to/from stand Lower Body Bathing Details (indicate cue type and reason): simulated Upper Body Dressing : Sitting;Set up   Lower Body Dressing: Minimal assistance;Sit to/from stand Lower Body Dressing Details (indicate cue type and reason): simulated Toilet Transfer: Minimal assistance;Regular Toilet;Rolling walker (2 wheels)   Toileting- Clothing Manipulation and Hygiene: Minimal assistance;Sit to/from stand       Functional mobility during ADLs: Minimal assistance;Rolling walker (2 wheels) General ADL Comments: Pt able to cross the RLE over the LLE for dressing tasks this am with increased time.  Min assist for sit to stand from his recliner with report of nausea and light headedness.  BP taken in sitting at 127/77.  Discussed need for a 3:1 at home and pt in agreement, will order.  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) Vision Assessment?: No apparent visual deficits      Perception Perception Perception: Within Functional Limits   Praxis Praxis Praxis: Intact    Pertinent Vitals/Pain Pain Assessment Faces Pain Scale: Hurts a little bit Pain Location: LLE Pain Descriptors / Indicators: Discomfort, Grimacing Pain Intervention(s): Limited activity within patient's tolerance     Hand Dominance Right   Extremity/Trunk Assessment Upper Extremity Assessment Upper Extremity Assessment: Overall WFL for tasks assessed   Lower Extremity Assessment Lower Extremity Assessment: Defer to PT evaluation   Cervical / Trunk Assessment Cervical / Trunk Assessment: Kyphotic   Communication Communication Communication: No difficulties   Cognition Arousal/Alertness: Awake/alert Behavior During Therapy: WFL for tasks assessed/performed Overall Cognitive Status: No family/caregiver present to determine baseline cognitive functioning Area of Impairment: Safety/judgement, Awareness                   Current Attention Level: Sustained Memory: Decreased short-term memory Following Commands: Follows one step commands consistently   Awareness: Emergent                    Home Living Family/patient expects to be discharged to:: Private residence Living Arrangements: Spouse/significant other Available Help at Discharge: Family;Available 24 hours/day Type of Home: House Home Access: Stairs to enter [employer] of Steps: 4 Entrance Stairs-Rails: Can reach both Home Layout: Two level Alternate Level Stairs-Number of Steps: stair lift   Bathroom Shower/Tub: Teacher, early years/pre: Standard     Home Equipment: Conservation officer, nature (2 wheels);Rollator (4 wheels);Cane - single point;Grab bars - tub/shower;Shower seat   Additional Comments: Lives with wife who just retired from working as Therapist, sports for [employer]      Prior Functioning/Environment Prior Level of Function : Independent/Modified Independent             Mobility  Comments: Mod indep ambulating with rollator (does not like that 2-wheeled walker "gets away" from him); enjoys walking dog. pt retired Scientist, water quality ADLs Comments: typically sits on shower seat for bathing        OT Problem List: Decreased strength;Impaired balance (sitting and/or standing);Decreased activity tolerance;Pain;Decreased safety awareness;Decreased knowledge of use of DME or AE      OT Treatment/Interventions: Self-care/ADL training;Patient/family education;Balance training;Therapeutic activities;DME and/or AE instruction;Cognitive remediation/compensation    OT Goals(Current goals can be found in the care plan section) Acute Rehab OT Goals Patient Stated Goal: Pt wants to get back to doing some of his yard work, likes to [employer] and plants OT Goal Formulation: With patient Time For Goal Achievement: 07/08/22 Potential to Achieve Goals: Good  OT Frequency: Min 2X/week       AM-PAC OT "6 Clicks" Daily Activity     Outcome Measure Help from another person eating meals?: None Help from another person taking care of personal grooming?: A Little Help from another person toileting, which includes using toliet, bedpan, or urinal?: A Little Help from another person bathing (including washing, rinsing, drying)?: A Little Help from another person to put on and taking off regular upper body clothing?: None Help from another person to put on and taking off regular lower body clothing?: A Little 6 Click Score: 20   End of Session Equipment Utilized During Treatment: Rolling walker (2 wheels) Nurse Communication: Mobility status;Other (comment) (pt's nausea)  Activity Tolerance:  Other (comment) (limited by nausea) Patient left: in bed;with call bell/phone within reach;with bed alarm set  OT Visit Diagnosis: Unsteadiness on feet (R26.81);Muscle weakness (generalized) (M62.81);Pain;Other abnormalities of gait and mobility (R26.89) Pain - part of body:  (back)                 Time: 7737-3668 OT Time Calculation (min): 27 min Charges:  OT General Charges $OT Visit: 1 Visit OT Evaluation $OT Eval Moderate Complexity: 1 Mod OT Treatments $Self Care/Home Management : 8-22 mins Jamair Cato OTR/L 06/24/2022, 2:42 PM

## 2022-06-24 NOTE — Evaluation (Signed)
Physical Therapy Evaluation Patient Details Name: Justin Key MRN: [id number] DOB: [birth year] Today's Date: 06/24/2022  History of Present Illness  Pt is a 71 y.o. male admitted 06/23/22 for same day L common femoral artery to below-knee popliteal bypass graft. PMH includes CAD, PVD, DM, HTN, HLD, GERD, chronic back pain.   Clinical Impression  Pt presents with an overall decrease in functional mobility secondary to above. PTA, pt mod indep with rollator, lives with wife who is retired Therapist, sports, enjoys being active. Today, pt moving fairly well despite feeling dizzy (suspect related to pain meds; BP stable); pt ambulatory with RW and min guard for balance. Educ re: precautions, positioning, activity recommendations and importance of mobility. Pt would benefit from continued acute PT services to maximize functional mobility and independence prior to d/c home.      Recommendations for follow up therapy are one component of a multi-disciplinary discharge planning process, led by the attending physician.  Recommendations may be updated based on patient status, additional functional criteria and insurance authorization.  Follow Up Recommendations No PT follow up      Assistance Recommended at Discharge Frequent or constant Supervision/Assistance  Patient can return home with the following  A little help with bathing/dressing/bathroom;Assistance with cooking/housework;Assist for transportation;Help with stairs or ramp for entrance;Direct supervision/assist for medications management    Equipment Recommendations None recommended by PT  Recommendations for Other Services   Mobility Specialist   Functional Status Assessment Patient has had a recent decline in their functional status and demonstrates the ability to make significant improvements in function in a reasonable and predictable amount of time.     Precautions / Restrictions Precautions Precautions: Fall;Other (comment) Precaution  Comments: h/o L foot drop (wears AFO, does not have at hospital) Restrictions Weight Bearing Restrictions: No      Mobility  Bed Mobility Overal bed mobility: Modified Independent             General bed mobility comments: HOB elevated    Transfers Overall transfer level: Needs assistance Equipment used: Rolling walker (2 wheels) Transfers: Sit to/from Stand Sit to Stand: Min guard           General transfer comment: multiple sit<>stands from EOB (2x) and recliner (2x) to RW with min guard for balance; initial cues for hand placement    Ambulation/Gait Ambulation/Gait assistance: Min guard Gait Distance (Feet): 14 Feet (+ 100') Assistive device: Rolling walker (2 wheels) Gait Pattern/deviations: Step-to pattern, Step-through pattern, Trunk flexed, Antalgic, Decreased dorsiflexion - left Gait velocity: Decreased     General Gait Details: slow, mostly steady gait with RW and min guard for balance; noted forward flexed posture and L foot drop which pt able to compensate well for without AFO donned. pt c/o dizziness, suspect related to pain meds  Stairs            Wheelchair Mobility    Modified Rankin (Stroke Patients Only)       Balance Overall balance assessment: Needs assistance Sitting-balance support: No upper extremity supported, Feet supported Sitting balance-Leahy Scale: Fair Sitting balance - Comments: required assist to don L sock   Standing balance support: No upper extremity supported, Bilateral upper extremity supported Standing balance-Leahy Scale: Fair Standing balance comment: can static stand without UE support, preference for walker                             Pertinent Vitals/Pain Pain Assessment Pain Assessment: Faces Faces Pain  Scale: Hurts a little bit Pain Location: LLE Pain Descriptors / Indicators: Discomfort Pain Intervention(s): Monitored during session    Home Living Family/patient expects to be discharged  to:: Private residence Living Arrangements: Spouse/significant other Available Help at Discharge: Family;Available 24 hours/day Type of Home: House Home Access: Stairs to enter Entrance Stairs-Rails: Can reach both Entrance Stairs-Number of Steps: 4 Alternate Level Stairs-Number of Steps: stair lift Home Layout: Two level Home Equipment: Conservation officer, nature (2 wheels);Rollator (4 wheels);Cane - single point;Grab bars - tub/shower;Shower seat Additional Comments: Lives with wife who just retired from working as Therapist, sports for [employer]    Prior Function Prior Level of Function : Independent/Modified Independent             Mobility Comments: Mod indep ambulating with rollator (does not like that 2-wheeled walker "gets away" from him); enjoys walking dog. pt retired Scientist, water quality ADLs Comments: typically sits on shower seat for bathing     Hand Dominance        Extremity/Trunk Assessment   Upper Extremity Assessment Upper Extremity Assessment: Overall WFL for tasks assessed    Lower Extremity Assessment Lower Extremity Assessment: Generalized weakness;LLE deficits/detail LLE Deficits / Details: s/p LLE revascularization with good AROM; h/o L foot drop with no DF activation noted (pt reports wearing AFO at home) LLE Coordination: decreased fine motor    Cervical / Trunk Assessment Cervical / Trunk Assessment: Kyphotic  Communication   Communication: No difficulties  Cognition Arousal/Alertness: Awake/alert Behavior During Therapy: WFL for tasks assessed/performed Overall Cognitive Status: No family/caregiver present to determine baseline cognitive functioning Area of Impairment: Attention, Memory, Following commands, Awareness, Problem solving                   Current Attention Level: Selective Memory: Decreased short-term memory Following Commands: Follows multi-step commands inconsistently   Awareness: Emergent Problem Solving: Requires verbal cues General Comments:  apparent short-term memory deficits and decreased attention; may be baseline cognition. very pleasant and cooperative        General Comments General comments (skin integrity, edema, etc.): pt c/o dizziness worse with standing, feeling "loopy", suspect related to pain meds; BP 141/91    Exercises     Assessment/Plan    PT Assessment Patient needs continued PT services  PT Problem List Decreased strength;Decreased activity tolerance;Decreased balance;Decreased mobility;Decreased knowledge of precautions;Pain       PT Treatment Interventions DME instruction;Gait training;Stair training;Functional mobility training;Therapeutic activities;Therapeutic exercise;Balance training;Patient/family education    PT Goals (Current goals can be found in the Care Plan section)  Acute Rehab PT Goals Patient Stated Goal: return home PT Goal Formulation: With patient Time For Goal Achievement: 07/08/22 Potential to Achieve Goals: Good    Frequency Min 3X/week     Co-evaluation               AM-PAC PT "6 Clicks" Mobility  Outcome Measure Help needed turning from your back to your side while in a flat bed without using bedrails?: None Help needed moving from lying on your back to sitting on the side of a flat bed without using bedrails?: None Help needed moving to and from a bed to a chair (including a wheelchair)?: A Little Help needed standing up from a chair using your arms (e.g., wheelchair or bedside chair)?: A Little Help needed to walk in hospital room?: A Little Help needed climbing 3-5 steps with a railing? : A Little 6 Click Score: 20    End of Session Equipment Utilized During  Treatment: Gait belt Activity Tolerance: Patient tolerated treatment well Patient left: in chair;with call bell/phone within reach Nurse Communication: Mobility status PT Visit Diagnosis: Other abnormalities of gait and mobility (R26.89);Muscle weakness (generalized) (M62.81)    Time: 5537-4827 PT  Time Calculation (min) (ACUTE ONLY): 22 min   Charges:   PT Evaluation $PT Eval Moderate Complexity: [place], PT, DPT Acute Rehabilitation Services  Personal: [facility] Office: [place] 06/24/2022, 9:43 AM

## 2022-06-24 NOTE — Progress Notes (Signed)
   VASCULAR SURGERY ASSESSMENT & PLAN:   POD 1 LEFT FEMORAL TO BELOW-KNEE POPLITEAL ARTERY BYPASS WITH VEIN: Brisk Doppler signals left foot in the posterior tibial and peroneal position.  His incisions look fine.  Mobilize.  VASCULAR QUALITY INITIATIVE: He is on aspirin and is on a statin.  DVT PROPHYLAXIS: He is on subcu heparin.   SUBJECTIVE:   Pain well controlled.  PHYSICAL EXAM:   Vitals:   06/23/22 1456 06/23/22 2013 06/24/22 0053 06/24/22 0359  BP: (!) 161/105 (!) 164/80 115/84 127/70  Pulse: 77 87 87 86  Resp: '19 20 18 20  '$ Temp: 97.6 F (36.4 C) 98.6 F (37 C) 98 F (36.7 C) 98.5 F (36.9 C)  TempSrc: Oral Oral Oral Oral  SpO2: 99% 97% 96% 99%  Weight:      Height:       Brisk posterior tibial and peroneal signal with the Doppler. His incisions look fine. The great toe wound is stable.  LABS:   Lab Results  Component Value Date   WBC 6.7 06/24/2022   HGB 12.0 (L) 06/24/2022   HCT 35.0 (L) 06/24/2022   MCV 85.4 06/24/2022   PLT 154 06/24/2022   Lab Results  Component Value Date   CREATININE 0.99 06/24/2022   Lab Results  Component Value Date   INR 1.0 06/17/2022   CBG (last 3)  Recent Labs    06/23/22 1314 06/23/22 2123 06/24/22 0616  GLUCAP 152* 251* 150*    PROBLEM LIST:    Principal Problem:   PAD (peripheral artery disease) (HCC)   CURRENT MEDS:    aspirin EC  81 mg Oral Daily   docusate sodium  100 mg Oral Daily   glimepiride  8 mg Oral QAC breakfast   insulin aspart  0-9 Units Subcutaneous TID WC   linagliptin  5 mg Oral Daily   metFORMIN  500 mg Oral BID WC   metoCLOPramide (REGLAN) injection  10 mg Intravenous Q8H   metoCLOPramide  5 mg Oral BID AC   metoprolol succinate  25 mg Oral Daily   mirabegron ER  25 mg Oral Daily   mirtazapine  15 mg Oral QHS   pantoprazole  40 mg Oral BID   rosuvastatin  5 mg Oral Daily   sertraline  50 mg Oral Daily    Deitra Mayo Office: [phone] 06/24/2022

## 2022-06-24 NOTE — Progress Notes (Signed)
PHARMACIST LIPID MONITORING   Justin Key is a 71 y.o. male admitted on 06/23/2022 with PVD.  Pharmacy has been consulted to optimize lipid-lowering therapy with the indication of secondary prevention for clinical ASCVD.  Recent Labs:  Lipid Panel (last 6 months):   Lab Results  Component Value Date   CHOL 218 (H) 06/24/2022   TRIG 100 06/24/2022   HDL 52 06/24/2022   CHOLHDL 4.2 06/24/2022   VLDL 20 06/24/2022   LDLCALC 146 (H) 06/24/2022    Hepatic function panel (last 6 months):   Lab Results  Component Value Date   AST 24 06/17/2022   ALT 22 06/17/2022   ALKPHOS 73 06/17/2022   BILITOT 0.6 06/17/2022   BILIDIR 0.2 04/28/2022   IBILI 0.6 04/28/2022    SCr (since admission):   Serum creatinine: 0.99 mg/dL 06/24/22 0600 Estimated creatinine clearance: 81.8 mL/min  Current therapy and lipid therapy tolerance Current lipid-lowering therapy: crestor '5mg'$ /day (new addition) Previous lipid-lowering therapies (if applicable): Remote use of Atorvastatin. Medications tried are not clear Documented or reported allergies or intolerances to lipid-lowering therapies (if applicable): He reports history of myalgia  Assessment:   His cardiologist Dr. Tamala Julian has discussed lipid clinic with him.  I spoke to Mr Semel and will have the clinic contact him later for an appointment  Plan:    1.Statin intensity (high intensity recommended for all patients regardless of the LDL):  Statin intolerance noted. No statin changes due to serious side effects (ex. Myalgias with at least 2 different statins).  2.Add ezetimibe  No addition now.   3.Refer to lipid clinic:   Yes  4.Follow-up with:  Lipid clinic referral.  5.Follow-up labs after discharge:  Changes in lipid therapy were made. Check a lipid panel in 8-12 weeks then annually.      Hildred Laser, PharmD Clinical Pharmacist **Pharmacist phone directory can now be found on [email or web address] ([email or web address]).  Listed under [place].

## 2022-06-25 LAB — GLUCOSE, CAPILLARY
Glucose-Capillary: 96 mg/dL (ref 70–99)
Glucose-Capillary: 146 mg/dL — ABNORMAL HIGH (ref 70–99)
Glucose-Capillary: 185 mg/dL — ABNORMAL HIGH (ref 70–99)
Glucose-Capillary: 135 mg/dL — ABNORMAL HIGH (ref 70–99)

## 2022-06-25 LAB — APTT: aPTT: 30 seconds (ref 24–36)

## 2022-06-25 MED ORDER — ENSURE MAX PROTEIN PO LIQD
11.0000 [oz_av] | Freq: Every day | ORAL | Status: DC
  Filled 2022-06-25: qty 330

## 2022-06-25 MED ORDER — GLUCERNA SHAKE PO LIQD
237.0000 mL | ORAL | Status: DC
  Administered 2022-06-25: 237 mL via ORAL

## 2022-06-25 NOTE — TOC Initial Note (Signed)
Transition of Care (TOC) - Initial/Assessment Note  Marvetta Gibbons RN, BSN Transitions of Care Unit 4E- RN Case Manager See Treatment Team for direct phone #    Patient Details  Name: Justin Key MRN: [id number] Date of Birth: June 08, 1951  Transition of Care [facility]) CM/SW Contact:    Dawayne Patricia, RN Phone Number: 06/25/2022, 4:26 PM  Clinical Narrative:                 Spoke with pt and wife at the bedside to discuss transition needs. CM had been notified by [facility] - they are following patient with MD office protocol referral prearranged for [facility] needs. Wife confirms that she has spoken with [place] however with no recommendations from therapies here she and pt do not feel pt will need [place] services at this time. Wife is also a retired Marine scientist. Wife request that [facility] be contacted and [facility] referral be declined- CM will contact liaison and take care of this for pt and wife as per request.   Pt is asking for a BSC- discussed that insurance may not cover and potential cost- pt agreeable to have in house provider check on coverage and contact him for any cost- Call made to [facility] for DME referral- they will check on [facility] and call pt regarding any out of pocket cost.   Wife will transport home, no further TOC needs noted.   Expected Discharge Plan: [place] Barriers to Discharge: No Barriers Identified   Patient Goals and CMS Choice Patient states their goals for this hospitalization and ongoing recovery are:: return home w/ wife CMS Medicare.gov Compare Post Acute Care list provided to:: Patient Choice offered to / list presented to : Patient, Spouse  Expected Discharge Plan and Services Expected Discharge Plan: [place]   Discharge Planning Services: CM Consult Post Acute Care Choice: Home Health, Durable Medical Equipment Living arrangements for the past 2 months: Single Family Home                 DME Arranged: Bedside commode DME  Agency: [facility] Date DME Agency Contacted: 06/25/22 Time DME Agency Contacted: [phone] Representative spoke with at DME Agency: [place]:  (Long office protocol referral) [place] Agency: [facility]     Representative spoke with at [place]: Lattie Haw  Prior Living Arrangements/Services Living arrangements for the past 2 months: [place] with:: Spouse Patient language and need for interpreter reviewed:: Yes Do you feel safe going back to the place where you live?: Yes      Need for Family Participation in Patient Care: Yes (Comment) Care giver support system in place?: Yes (comment) Current home services: DME Criminal Activity/Legal Involvement Pertinent to Current Situation/Hospitalization: No - Comment as needed  Activities of Daily Living Home Assistive Devices/Equipment: Environmental consultant (specify type), Cane (specify quad or straight) ADL Screening (condition at time of admission) Patient's cognitive ability adequate to safely complete daily activities?: Yes Is the patient deaf or have difficulty hearing?: No Does the patient have difficulty seeing, even when wearing glasses/contacts?: No Does the patient have difficulty concentrating, remembering, or making decisions?: No Patient able to express need for assistance with ADLs?: Yes Does the patient have difficulty dressing or bathing?: No Independently performs ADLs?: Yes (appropriate for developmental age) Does the patient have difficulty walking or climbing stairs?: Yes Weakness of Legs: Both Weakness of Arms/Hands: None  Permission Sought/Granted Permission sought to share information with : Customer service manager  Permission granted to share information with : Yes, Verbal Permission Granted              Emotional Assessment Appearance:: Appears stated age Attitude/Demeanor/Rapport: Engaged Affect (typically observed): Accepting, Appropriate, Pleasant Orientation: : Oriented to Self,  Oriented to Place, Oriented to  Time, Oriented to Situation Alcohol / Substance Use: Not Applicable Psych Involvement: No (comment)  Admission diagnosis:  PAD (peripheral artery disease) ([place]) [I73.9] Patient Active Problem List   Diagnosis Date Noted   PAD (peripheral artery disease) ([place]) 06/23/2022   Chest pain 04/28/2022   Insulin dependent type 2 diabetes mellitus ([place]) 04/28/2022   Hypertension associated with diabetes ([place]) 04/28/2022   Hyperlipidemia associated with type 2 diabetes mellitus ([place]) 04/28/2022   Renal insufficiency 04/28/2022   Peripheral artery disease ([place]) 04/28/2022   Gastroparesis due to DM ([place]) 04/28/2022   Esophageal dysphagia    Chronic constipation 01/01/2021   Chronic nausea 01/01/2021   Urinary incontinence 01/01/2021   Chronic bilateral low back pain 01/01/2021   Esophagogastric junction outflow obstruction 01/01/2021   Personal history of multiple spine surgeries 01/01/2021   Chronic insomnia 01/01/2021   Anal sphincter incompetence 01/01/2021   Upper airway cough syndrome 06/15/2019   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   CAD (coronary artery disease), native coronary artery 08/24/2018   Angina pectoris ([place]) 08/24/2018   GERD (gastroesophageal reflux disease) 06/21/2018   Hx of adenomatous colonic polyps 06/21/2018   Pulmonary infiltrate present on computed tomography 10/12/2017   PCP:  Kathyrn Lass, MD Pharmacy:   [facility] [place] [address] Phone: [phone] Fax: [phone]     Social Determinants of Health (SDOH) Interventions    Readmission Risk Interventions     No data to display

## 2022-06-25 NOTE — Progress Notes (Signed)
Received cal from [place] notified pt had 6 beats of v tach. RN checked on pt. Pt asymptomatic.   Lavenia Atlas, RN

## 2022-06-25 NOTE — Progress Notes (Signed)
Initial Nutrition Assessment  DOCUMENTATION CODES:   Non-severe (moderate) malnutrition in context of chronic illness  INTERVENTION:  Encouraged adequate intake of calories and protein at meals. Discussed foods patient may tolerate best in setting of gastroparesis.  Provide Ensure Max protein supplement once daily, each supplement provides 150kcal and 30g of protein..  Glucerna Shake po once daily, each supplement provides 220 kcal and 10 grams of protein.  Pending oral intake, may need to consider supplements higher in both calories and protein to meet estimated needs.  NUTRITION DIAGNOSIS:   Moderate Malnutrition related to chronic illness (gastroparesis) as evidenced by mild fat depletion, mild muscle depletion, moderate muscle depletion.  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  Met with patient at bedside. He reports his appetite is variable in setting of gastroparesis. Some days he is able to eat well and others he is not. He typically eats 2 meals per day. He may have sandwich and high calorie soup or cereal with milk. He reports he is eating well here. He ate 100% of breakfast and 95% of lunch today. At home he typically drinks Ensure Max Protein once daily. He is also interested in trying Glucerna. Discussed that Ensure Max Protein is high in protein but low in calories and Glucerna is slightly higher in calories but is low in protein. Patient requests to try one Ensure Max Protein and one Glucerna daily. May need to consider supplements higher in calories and  protein pending oral intake. With current intake of 95-100% this may be adequate to meet estimated needs. Patient reports occasional nausea and that he is going to be ordered an antiemetic. He denies emesis or abdominal pain. He reports at baseline he has 1 BM every 3 days and occasionally requires bowel regimen at home.   Patient reports UBW was 220 lbs and he last weighed this at least 1.5 years ago and has had slow wt loss over time. Current wt is 89.4 kg (197 lbs). Per wt history in chart pt has actually gained wt. He was 74.2 kg on 12/31/20 and has gained 15.2 kg since then.  Medications reviewed and include: Colace, Novolog 0-9 units TID, Tradjenta, metformin 500 mg BID, Reglan 10 mg Q8hrs IV, pantoprazole, Remeron 15 mg QHS.  Labs reviewed: CBG 72-212 past 24hrs. On 10/4: Potassium 3.2.  UOP: 1402 mL (0.7 mL/kg/hr)  No documented bowel movement. Pt ordered for bowel regimen.  NUTRITION - FOCUSED PHYSICAL EXAM:  Flowsheet Row Most Recent Value  Orbital Region Mild depletion  Upper Arm Region Moderate depletion  Thoracic and Lumbar Region Mild depletion  Buccal Region Mild depletion  Temple Region Mild depletion  Clavicle Bone Region Moderate depletion  Clavicle and Acromion Bone Region Moderate depletion  Scapular Bone Region Mild depletion  Dorsal Hand Severe depletion  Patellar Region Moderate depletion  Anterior Thigh Region Moderate depletion  Posterior Calf Region Moderate depletion  Edema (RD Assessment) None  Hair Reviewed  Eyes Reviewed  Mouth Reviewed  [Upper and lower dentures]  Skin Reviewed  Nails Reviewed      Diet  Order:   Diet Order             Diet Carb Modified Fluid consistency: Thin; Room service appropriate? No  Diet effective now                   EDUCATION NEEDS:   No education needs have been identified at this time  Skin:  Skin Assessment: Skin Integrity Issues: Skin Integrity Issues:: Incisions Incisions: closed incision left  leg  Last BM:  06/20/22 per chart  Height:   Ht Readings from Last 1 Encounters:  06/23/22 '6\' 3"'  (1.905 m)    Weight:   Wt Readings from Last 1 Encounters:  06/23/22 89.4 kg    BMI:  Body mass index is 24.62 kg/m.  Estimated Nutritional Needs:   Kcal:  2100-2400 (MSJ x 1.2-1.4)  Protein:  110-120 grams  Fluid:  2.1-2.4 L/day  Loanne Drilling, MS, RD, LDN, CNSC Pager number available on [facility]

## 2022-06-25 NOTE — Progress Notes (Signed)
   VASCULAR SURGERY ASSESSMENT & PLAN:   POD 2 L FEM BK POP BYPASS WITH VEIN: Brisk Doppler signals left foot in the posterior tibial and peroneal position.  His incisions look fine.  Mobilize.   VASCULAR QUALITY INITIATIVE: He is on aspirin and is on a statin.   DVT PROPHYLAXIS: He is on subcu heparin.  Anticipate discharge tomorrow.   SUBJECTIVE:   No specific complaints.  PHYSICAL EXAM:   Vitals:   06/24/22 1940 06/24/22 2324 06/25/22 0345 06/25/22 0611  BP: (!) 144/76 115/71  134/86  Pulse: 72 87  82  Resp: '19 19 13 16  '$ Temp: 98.4 F (36.9 C) 98.2 F (36.8 C)  97.9 F (36.6 C)  TempSrc: Oral Oral  Oral  SpO2: 99% 97%  98%  Weight:      Height:       PT > Per signal with Doppler on the left. We will let the tip of the toe auto amputate.  This is dry. His incisions look fine.  LABS:   CBG (last 3)  Recent Labs    06/24/22 1746 06/24/22 2135 06/25/22 0612  GLUCAP 212* 72 96    PROBLEM LIST:    Principal Problem:   PAD (peripheral artery disease) (HCC)   CURRENT MEDS:    aspirin EC  81 mg Oral Daily   docusate sodium  100 mg Oral Daily   glimepiride  8 mg Oral QAC breakfast   insulin aspart  0-9 Units Subcutaneous TID WC   linagliptin  5 mg Oral Daily   metFORMIN  500 mg Oral BID WC   metoCLOPramide (REGLAN) injection  10 mg Intravenous Q8H   metoprolol succinate  25 mg Oral Daily   mirabegron ER  25 mg Oral Daily   mirtazapine  15 mg Oral QHS   pantoprazole  40 mg Oral BID   rosuvastatin  5 mg Oral Daily   sertraline  50 mg Oral Daily    Justin Key Office: [phone] 06/25/2022

## 2022-06-25 NOTE — Progress Notes (Signed)
Occupational Therapy Treatment Patient Details Name: Justin Key MRN: [id number] DOB: [birth year] Today's Date: 06/25/2022   History of present illness Pt is a 71 y.o. male admitted 06/23/22 for same day L common femoral artery to below-knee popliteal bypass graft. PMH includes CAD, PVD, DM, HTN, HLD, GERD, chronic back pain.   OT comments  Patient received in supine and able to get to EOB with supervision.  Patient required increased time after sitting on EOB due to complaints of dizziness. Patient ambulated to sink and performed grooming tasks seated due to fatigue and complaints of BLE weakness. Patient instructed on shower transfer with simulated tub ream to step over. Patient was min assist to perform transfer using bath rails.  Patient stated setup was very similar to home setup and feels comfortable performing transfer at home with wife present. Acute OT to continue to follow.    Recommendations for follow up therapy are one component of a multi-disciplinary discharge planning process, led by the attending physician.  Recommendations may be updated based on patient status, additional functional criteria and insurance authorization.    Follow Up Recommendations  No OT follow up    Assistance Recommended at Discharge PRN  Patient can return home with the following  A little help with bathing/dressing/bathroom;Assistance with cooking/housework;A little help with walking and/or transfers   Equipment Recommendations  BSC/3in1    Recommendations for Other Services      Precautions / Restrictions Precautions Precautions: Fall;Other (comment) Precaution Comments: h/o L foot drop (wears AFO, does not have at hospital) Restrictions Weight Bearing Restrictions: No       Mobility Bed Mobility Overal bed mobility: Needs Assistance Bed Mobility: Sit to Supine       Sit to supine: Supervision   General bed mobility comments: HOB elevated    Transfers Overall transfer level:  Needs assistance Equipment used: Rolling walker (2 wheels) Transfers: Sit to/from Stand Sit to Stand: Min assist           General transfer comment: min assist to stand and to perform shower/tub transfers     Balance Overall balance assessment: Needs assistance Sitting-balance support: No upper extremity supported, Feet supported Sitting balance-Leahy Scale: Fair     Standing balance support: Bilateral upper extremity supported, During functional activity, Reliant on assistive device for balance Standing balance-Leahy Scale: Poor Standing balance comment: required assistance from RW and rails in bathroom to assist with standing balance                           ADL either performed or assessed with clinical judgement   ADL Overall ADL's : Needs assistance/impaired     Grooming: Wash/dry hands;Wash/dry face;Oral care;Set up;Sitting Grooming Details (indicate cue type and reason): at sink                         Tub/ Shower Transfer: Walk-in shower;Tub transfer;Minimal assistance;Cueing for safety Tub/Shower Transfer Details (indicate cue type and reason): performed simulated tub transfer with walk in shower and stepping over garbage can on its side   General ADL Comments: increased time    Extremity/Trunk Assessment              Vision       Perception     Praxis      Cognition Arousal/Alertness: Awake/alert Behavior During Therapy: WFL for tasks assessed/performed Overall Cognitive Status: No family/caregiver present to determine baseline cognitive functioning Area of  Impairment: Safety/judgement, Awareness                   Current Attention Level: Sustained Memory: Decreased short-term memory Following Commands: Follows one step commands consistently   Awareness: Emergent Problem Solving: Requires verbal cues General Comments: pleasant and cooperative with cues for safety        Exercises      Shoulder Instructions        General Comments      Pertinent Vitals/ Pain       Pain Assessment Pain Assessment: Faces Faces Pain Scale: Hurts a little bit Pain Location: LLE Pain Descriptors / Indicators: Discomfort, Grimacing Pain Intervention(s): Limited activity within patient's tolerance, Monitored during session, Repositioned  Home Living                                          Prior Functioning/Environment              Frequency  Min 2X/week        Progress Toward Goals  OT Goals(current goals can now be found in the care plan section)  Progress towards OT goals: Progressing toward goals  Acute Rehab OT Goals Patient Stated Goal: go home OT Goal Formulation: With patient Time For Goal Achievement: 07/08/22 Potential to Achieve Goals: Good ADL Goals Pt Will Perform Grooming: with supervision;standing Pt Will Perform Lower Body Bathing: with supervision;sit to/from stand Pt Will Perform Lower Body Dressing: with supervision;sit to/from stand Pt Will Transfer to Toilet: with supervision;anterior/posterior transfer;bedside commode Pt Will Perform Toileting - Clothing Manipulation and hygiene: with supervision;sit to/from stand Pt Will Perform Tub/Shower Transfer: shower seat;ambulating;Tub transfer;with min assist  Plan Discharge plan remains appropriate    Co-evaluation                 AM-PAC OT "6 Clicks" Daily Activity     Outcome Measure   Help from another person eating meals?: None Help from another person taking care of personal grooming?: A Little Help from another person toileting, which includes using toliet, bedpan, or urinal?: A Little Help from another person bathing (including washing, rinsing, drying)?: A Little Help from another person to put on and taking off regular upper body clothing?: None Help from another person to put on and taking off regular lower body clothing?: A Little 6 Click Score: 20    End of Session Equipment  Utilized During Treatment: Rolling walker (2 wheels)  OT Visit Diagnosis: Unsteadiness on feet (R26.81);Muscle weakness (generalized) (M62.81);Pain;Other abnormalities of gait and mobility (R26.89) Pain - Right/Left: Left Pain - part of body: Leg   Activity Tolerance Patient tolerated treatment well   Patient Left in chair;with call bell/phone within reach   Nurse Communication Mobility status        Time: 7017-7939 OT Time Calculation (min): 31 min  Charges: OT General Charges $OT Visit: 1 Visit OT Treatments $Self Care/Home Management : 23-37 mins  Lodema Hong, [place]  Office [place] 06/25/2022, 10:23 AM

## 2022-06-26 ENCOUNTER — Other Ambulatory Visit (HOSPITAL_COMMUNITY): Payer: Self-pay

## 2022-06-26 DIAGNOSIS — E440 Moderate protein-calorie malnutrition: Secondary | ICD-10-CM | POA: Insufficient documentation

## 2022-06-26 LAB — GLUCOSE, CAPILLARY: Glucose-Capillary: 100 mg/dL — ABNORMAL HIGH (ref 70–99)

## 2022-06-26 MED ORDER — ROSUVASTATIN CALCIUM 5 MG PO TABS
5.0000 mg | ORAL_TABLET | Freq: Every day | ORAL | 11 refills | Status: DC
  Filled 2022-06-26: qty 30, 30d supply, fill #0
  Filled 2022-07-29: qty 30, 30d supply, fill #1
  Filled 2022-09-02: qty 30, 30d supply, fill #2
  Filled 2022-10-19: qty 30, 30d supply, fill #3
  Filled 2022-11-27: qty 30, 30d supply, fill #4
  Filled 2023-01-03: qty 30, 30d supply, fill #5

## 2022-06-26 MED ORDER — ROSUVASTATIN CALCIUM 5 MG PO TABS: 5.0000 mg | ORAL_TABLET | Freq: Every day | ORAL | 11 refills | Status: DC

## 2022-06-26 MED ORDER — OXYCODONE-ACETAMINOPHEN 5-325 MG PO TABS
1.0000 | ORAL_TABLET | Freq: Four times a day (QID) | ORAL | 0 refills | Status: DC | PRN
  Filled 2022-06-26: qty 30, 8d supply, fill #0

## 2022-06-26 MED ORDER — OXYCODONE-ACETAMINOPHEN 5-325 MG PO TABS: 1.0000 | ORAL_TABLET | Freq: Four times a day (QID) | ORAL | 0 refills | Status: DC | PRN

## 2022-06-26 NOTE — TOC Transition Note (Signed)
Transition of Care (TOC) - CM/SW Discharge Note Marvetta Gibbons RN, BSN Transitions of Care Unit 4E- RN Case Manager See Treatment Team for direct phone #    Patient Details  Name: Justin Key MRN: [id number] Date of Birth: 1950-11-16  Transition of Care [facility]) CM/SW Contact:  Dawayne Patricia, RN Phone Number: 06/26/2022, 10:15 AM   Clinical Narrative:    Pt stable for transition home today. DME - BSC has been delivered to room by in house provider.   Latricia Heft has been notified that pt declined referral from vascular office.   Wife to transport home  No further TOC needs noted.    Final next level of care: Home/Self Care Barriers to Discharge: No Barriers Identified   Patient Goals and CMS Choice Patient states their goals for this hospitalization and ongoing recovery are:: return home w/ wife CMS Medicare.gov Compare Post Acute Care list provided to:: Patient Choice offered to / list presented to : Patient, Spouse  Discharge Placement                 Home      Discharge Plan and Services   Discharge Planning Services: CM Consult Post Acute Care Choice: Home Health, Durable Medical Equipment          DME Arranged: Bedside commode DME Agency: [employer] Date DME Agency Contacted: 06/25/22 Time DME Agency Contacted: [phone] Representative spoke with at DME Agency: Jodell Cipro [place]:  ([place] office protocol referral) [place] Agency: [facility]     Representative spoke with at [place]: [place] ([place]) Interventions     Readmission Risk Interventions    06/26/2022   10:15 AM  Readmission Risk Prevention Plan  Post Dischage Appt Complete  Medication Screening Complete  Transportation Screening Complete

## 2022-06-26 NOTE — Progress Notes (Addendum)
  Progress Note    06/26/2022 7:58 AM 3 Days Post-Op  Subjective:  no major complaints. Ready to go home today. Says back is more painful then leg with history of multiple surgeries   Vitals:   06/25/22 2320 06/26/22 0436  BP: 123/71 (!) 142/83  Pulse: 87 80  Resp: 13 13  Temp: 98.1 F (36.7 C) 97.9 F (36.6 C)  SpO2: 96% 100%   Physical Exam: Cardiac:  regular Lungs:  non labored Incisions:  left leg incisions are clean, dry and intact without swelling or hematoma Extremities:  well perfused and warm with palpable PT pulse Abdomen:  soft Neurologic: alert and oriented  CBC    Component Value Date/Time   WBC 6.7 06/24/2022 0600   RBC 4.10 (L) 06/24/2022 0600   HGB 12.0 (L) 06/24/2022 0600   HGB 13.5 08/17/2018 1522   HCT 35.0 (L) 06/24/2022 0600   HCT 39.7 08/17/2018 1522   PLT 154 06/24/2022 0600   PLT 246 08/17/2018 1522   MCV 85.4 06/24/2022 0600   MCV 87 08/17/2018 1522   MCH 29.3 06/24/2022 0600   MCHC 34.3 06/24/2022 0600   RDW 14.1 06/24/2022 0600   RDW 13.5 08/17/2018 1522   LYMPHSABS 2.1 12/30/2019 1149   MONOABS 0.8 12/30/2019 1149   EOSABS 0.0 12/30/2019 1149   BASOSABS 0.0 12/30/2019 1149    BMET    Component Value Date/Time   NA 138 06/24/2022 0600   NA 139 08/17/2018 1522   K 3.2 (L) 06/24/2022 0600   CL 105 06/24/2022 0600   CO2 22 06/24/2022 0600   GLUCOSE 150 (H) 06/24/2022 0600   BUN 14 06/24/2022 0600   BUN 16 08/17/2018 1522   CREATININE 0.99 06/24/2022 0600   CALCIUM 9.0 06/24/2022 0600   GFRNONAA >60 06/24/2022 0600   GFRAA >60 12/30/2019 1149    INR    Component Value Date/Time   INR 1.0 06/17/2022 1600     Intake/Output Summary (Last 24 hours) at 06/26/2022 0758 Last data filed at 06/26/2022 0600 Gross per 24 hour  Intake 480 ml  Output 1000 ml  Net -520 ml     Assessment/Plan:  71 y.o. male is s/p left fem BK pop bypass with vein 3 Days Post-Op   Pain well controlled Left leg is well perfused with a palpable  PT pulse Incisions are intact and well appearing Mobilize as tolerated Continue Aspirin and statin Stable for discharge home today Will arrange follow up in 2-3 weeks for incision check   Karoline Caldwell, PA-C [facility] [phone] 06/26/2022 7:58 AM  I have interviewed the patient and examined the patient. I agree with the findings by the PA. OK for discharge today.  Gae Gallop, MD

## 2022-06-26 NOTE — Discharge Instructions (Signed)
 [facility]   Discharge instructions  Lower Extremity Bypass Surgery  Please refer to the following instruction for your post-procedure care. Your surgeon or physician assistant will discuss any changes with you.  Activity  You are encouraged to walk as much as you can. You can slowly return to normal activities during the month after your surgery. Avoid strenuous activity and heavy lifting until your doctor tells you it's OK. Avoid activities such as vacuuming or swinging a golf club. Do not drive until your doctor give the OK and you are no longer taking prescription pain medications. It is also normal to have difficulty with sleep habits, eating and bowel movement after surgery. These will go away with time.  Bathing/Showering  Shower daily after you go home. Do not soak in a bathtub, hot tub, or swim until the incision heals completely.  Incision Care  Clean your incision with mild soap and water. Shower every day. Pat the area dry with a clean towel. You do not need a bandage unless otherwise instructed. Do not apply any ointments or creams to your incision. If you have open wounds you will be instructed how to care for them or a visiting nurse may be arranged for you. If you have staples or sutures along your incision they will be removed at your post-op appointment. You may have skin glue on your incision. Do not peel it off. It will come off on its own in about one week.  Wash the groin wound with soap and water daily and pat dry. (No tub bath-only shower)  Then put a dry gauze or washcloth in the groin to keep this area dry to help prevent wound infection.  Do this daily and as needed.  Do not use Vaseline or neosporin on your incisions.  Only use soap and water on your incisions and then protect and keep dry.  Diet  Resume your normal diet. There are no special food restrictions following this procedure. A low fat/ low cholesterol diet is  recommended for all patients with vascular disease. In order to heal from your surgery, it is CRITICAL to get adequate nutrition. Your body requires vitamins, minerals, and protein. Vegetables are the best source of vitamins and minerals. Vegetables also provide the perfect balance of protein. Processed food has little nutritional value, so try to avoid this.  Medications  Resume taking all your medications unless your doctor or physician assistant tells you not to. If your incision is causing pain, you may take over-the-counter pain relievers such as acetaminophen (Tylenol). If you were prescribed a stronger pain medication, please aware these medication can cause nausea and constipation. Prevent nausea by taking the medication with a snack or meal. Avoid constipation by drinking plenty of fluids and eating foods with high amount of fiber, such as fruits, vegetables, and grains. Take Colace 100 mg (an over-the-counter stool softener) twice a day as needed for constipation.  Do not take Tylenol if you are taking prescription pain medications.  Follow Up  Our office will schedule a follow up appointment 2-3 weeks following discharge.  Please call us immediately for any of the following conditions  Severe or worsening pain in your legs or feet while at rest or while walking Increase pain, redness, warmth, or drainage (pus) from your incision site(s) Fever of 101 degree or higher The swelling in your leg with the bypass suddenly worsens and becomes more painful than when you were in the hospital If you have   been instructed to feel your graft pulse then you should do so every day. If you can no longer feel this pulse, call the office immediately. Not all patients are given this instruction.  Leg swelling is common after leg bypass surgery.  The swelling should improve over a few months following surgery. To improve the swelling, you may elevate your legs above the level of your heart while you are  sitting or resting. Your surgeon or physician assistant may ask you to apply an ACE wrap or wear compression (TED) stockings to help to reduce swelling.  Reduce your risk of vascular disease  Stop smoking. If you would like help call [facility] at 1-800-QUIT-NOW (1-800-784-8669) or [place] at [phone].  Manage your cholesterol Maintain a desired weight Control your diabetes weight Control your diabetes Keep your blood pressure down  If you have any questions, please call the office at [phone]  

## 2022-06-26 NOTE — Progress Notes (Addendum)
Discharge Note  Patient and wife both alert and oriented, verbalized understanding of  dc instructions. All belongings given to patient.BSC also given to patient for home use. Surgical site and iv site both unremarkable upon dcd. Patient also advised to contact primary  md for updates and home meds questions.

## 2022-06-26 NOTE — Discharge Summary (Signed)
Bypass Discharge Summary Patient ID: Justin Key [id number] 71 y.o. 07/24/1951  Admit date: 06/23/2022  Discharge date and time: 06/26/2022 11:14 AM   Admitting Physician: Angelia Mould, MD   Discharge Physician: Angelia Mould, MD  Admission Diagnoses: PAD (peripheral artery disease) [facility]) [I73.9]  Discharge Diagnoses: PAD (peripheral artery disease) ([place]) [I73.9]  Admission Condition: fair  Discharged Condition: good  Indication for Admission:  Justin Key is a 71 y.o. 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  He had evidence of severe infrainguinal arterial occlusive disease on exam.  He underwent arteriography which showed a flush occlusion of the superficial femoral artery with reconstitution of the below-knee popliteal artery.  He had single-vessel runoff via the peroneal artery with disease proximally.  He then reconstituted the posterior tibial artery.  Vein map preoperatively showed a marginal size vein below the knee on the left.  I looked at the right great saphenous vein myself with the SonoSite and this was not adequate below the mid thigh.  Of note the wound on the left toe has been relatively stable over the last 2 months and therefore I felt it would be reasonable to not go all the way to the posterior tibial artery if we did not have adequate vein to do on all autogenous bypass.  Hospital Course: Justin Key was admitted on 06/23/22 and underwent Left common femoral artery to below-knee popliteal artery bypass with nonreversed translocated saphenous vein graft by Dr. Scot Dock. He tolerated the surgery well and was taken to the recovery room in stable condition.  By POD#1, his left leg remained well perfused with brisk PT and peroneal doppler signals. His incisions were intact and well appearing. Pain overall well controlled. He was cleared by PT and OT. Mobilizing without difficulty. Tolerating diet. His vitals remained  stable and he was hemodynamically stable  POD#2, overnight he had 6 beats of v tach noted by RN. no symptoms. No recurrence. He otherwise remained stable. He did well with mobilizing. His left leg remained well perfused with brisk signals. His incisions intact and well appearing  POD#3, he remained stable for discharge home. No Home health needs. Patient will follow up with their insurance regarding bed side commode. Left leg remained well perfused with brisk PT and peroneal doppler signals. Incisions intact. Pain well controlled. He will continue on home medications as prescribed including Aspirin and Statin. PDMP reviewed and post operative pain medication sent to patients pharmacy. Follow up arranged in 2-3 weeks for incision check  Consults: None  Treatments: analgesia: Morphine and Aspirin, Oxycodone-Acetaminophen, therapies: PT, OT, and RN, and surgery: Left common femoral artery to below-knee popliteal artery bypass with nonreversed translocated saphenous vein graft   Disposition: Discharge disposition: 01-Home or Self Care       - For [facility] Registry use ---  Post-op:  Wound infection: No  Graft infection: No  Transfusion: No  If yes, o units given New Arrhythmia: No Patency judged by: [ X] Dopper only, '[ ]'$  Palpable graft pulse, '[ ]'$  Palpable distal pulse, '[ ]'$  ABI inc. > 0.15, '[ ]'$  Duplex D/C Ambulatory Status: Ambulatory  Complications: MI: Valu.Nieves ] No, '[ ]'$  Troponin only, '[ ]'$  EKG or Clinical CHF: No Resp failure: '[ ]'$  none, '[ ]'$  Pneumonia, '[ ]'$  Ventilator Chg in renal function: '[ ]'$  none, '[ ]'$  Inc. Cr > 0.5, '[ ]'$  Temp. Dialysis, '[ ]'$  Permanent dialysis Stroke: Valu.Nieves ] None, '[ ]'$  Minor, '[ ]'$   Major Return to OR: No  Reason for return to OR: '[ ]'$  Bleeding, '[ ]'$  Infection, '[ ]'$  Thrombosis, '[ ]'$  Revision  Discharge medications: Statin use:  Yes ASA use:  Yes Plavix use:  No  for medical reason not indicated Beta blocker use: Yes Coumadin use: No  for medical reason not indicated    Patient  Instructions:  Allergies as of 06/26/2022       Reactions   Humibid La [guaifenesin] Other (See Comments)   hallucinations        Medication List     STOP taking these medications    doxycycline 100 MG tablet Commonly known as: VIBRA-TABS   traMADol 50 MG tablet Commonly known as: ULTRAM       TAKE these medications    aspirin EC 81 MG tablet Take 1 tablet (81 mg total) by mouth daily.   carboxymethylcellulose 0.5 % Soln Commonly known as: REFRESH PLUS Place 1 drop into both eyes 3 (three) times daily as needed (dry eyes).   FREESTYLE LITE test strip Generic drug: glucose blood Use to check blood glucose 2 times daily   Gemtesa 75 MG Tabs Generic drug: Vibegron Take 75 mg by mouth daily.   glimepiride 4 MG tablet Commonly known as: AMARYL TAKE 2 TABLETS BY MOUTH ONCE A DAY   ibuprofen 200 MG tablet Commonly known as: ADVIL Take 600-800 mg by mouth every 8 (eight) hours as needed for moderate pain.   Januvia 100 MG tablet Generic drug: sitaGLIPtin Take 1 tablet (100 mg total) by mouth daily.   Jardiance 10 MG Tabs tablet Generic drug: empagliflozin Take 1 tablet (10 mg total) by mouth daily.   lidocaine 5 % Commonly known as: LIDODERM Apply 2 patches to affected area daily for up to 12 hours.  Remove for 12 hours & repeat as directed.   metFORMIN 500 MG 24 hr tablet Commonly known as: GLUCOPHAGE-XR TAKE 1 TABLET BY MOUTH TWO TIMES DAILY   metoCLOPramide 5 MG tablet Commonly known as: REGLAN Take 1 tablet (5 mg total) by mouth 2 (two) times daily before a meal as needed   metoprolol succinate 25 MG 24 hr tablet Commonly known as: TOPROL-XL Take 1 tablet (25 mg total) by mouth daily.   mirtazapine 15 MG tablet Commonly known as: Remeron Take 1 tablet (15 mg total) by mouth at bedtime.   nitroGLYCERIN 0.4 MG SL tablet Commonly known as: NITROSTAT Place 1 tablet (0.4 mg total) under the tongue every 5 (five) minutes as needed for chest pain. If  no relief after 3 doses call 911   oxyCODONE-acetaminophen 5-325 MG tablet Commonly known as: PERCOCET/ROXICET Take 1 tablet by mouth every 6 (six) hours as needed for moderate pain.   pantoprazole 40 MG tablet Commonly known as: PROTONIX Take 1 tablet (40 mg total) by mouth 2 (two) times daily.   rosuvastatin 5 MG tablet Commonly known as: CRESTOR Take 1 tablet (5 mg total) by mouth daily.   sertraline 50 MG tablet Commonly known as: ZOLOFT Take 1 tablet by mouth once a day What changed: Another medication with the same name was removed. Continue taking this medication, and follow the directions you see here.   Tyler Aas FlexTouch 100 UNIT/ML FlexTouch Pen Generic drug: insulin degludec Inject 8 Units into the skin daily in the evening   Unifine Pentips 32G X 4 MM Misc Generic drug: Insulin Pen Needle Use as directed daily   Unifine Pentips 32G X 4 MM Misc Generic drug: Insulin  Pen Needle Use as directed once a day   VITAMIN D PO Take 1 tablet by mouth daily.               Durable Medical Equipment  (From admission, onward)           Start     Ordered   06/24/22 1514  For home use only DME Bedside commode  Once       Comments: Pt is not able to get up and down from his lowered toilet and needs a seat over the toilet to make it higher.  Question:  Patient needs a bedside commode to treat with the following condition  Answer:  Muscle weakness (generalized)   06/24/22 1521              Discharge Care Instructions  (From admission, onward)           Start     Ordered   06/26/22 0000  Discharge wound care:       Comments: You may shower. Wash incisions with mild soap and water, pat dry. Do not soak in bathtub   06/26/22 0807           Activity: activity as tolerated, no driving while on analgesics, and no heavy lifting for 6 weeks Diet: diabetic diet and low fat, low cholesterol diet Wound Care:  wash incisions daily with mild soap and water,  pat dry. Do not soak in bathtub  Follow-up with Dr. Scot Dock in  2-3  weeks.  Signed: Karoline Caldwell 06/26/2022 12:44 PM

## 2022-06-26 NOTE — Progress Notes (Signed)
Mobility Specialist Progress Note:   06/26/22 1014  Mobility  Activity Ambulated with assistance in hallway  Activity Response Tolerated well  Distance Ambulated (ft) 220 ft  $Mobility charge 1 Mobility  Level of Assistance Standby assist, set-up cues, supervision of patient - no hands on  Assistive Device Front wheel walker   Pt in bed willing to participate in mobility. No complaints of pain. Left in chair with call bell in reach and all needs met.   [facility] Surveyor, mining Chat only

## 2022-06-26 NOTE — Care Management Important Message (Signed)
Important Message  Patient Details  Name: Justin Key MRN: [id number] Date of Birth: [birth year]   Medicare Important Message Given:  Yes     Justin Key 06/26/2022, 9:53 AM

## 2022-07-02 ENCOUNTER — Ambulatory Visit: Payer: HMO | Admitting: Podiatry

## 2022-07-02 ENCOUNTER — Other Ambulatory Visit (HOSPITAL_COMMUNITY): Payer: Self-pay

## 2022-07-02 ENCOUNTER — Ambulatory Visit (INDEPENDENT_AMBULATORY_CARE_PROVIDER_SITE_OTHER): Payer: HMO | Admitting: Physician Assistant

## 2022-07-02 ENCOUNTER — Telehealth: Payer: Self-pay

## 2022-07-02 VITALS — BP 112/69 | HR 72 | Temp 97.7°F | Resp 18 | Ht 75.0 in | Wt 204.2 lb

## 2022-07-02 DIAGNOSIS — I739 Peripheral vascular disease, unspecified: Secondary | ICD-10-CM

## 2022-07-02 MED ORDER — CEPHALEXIN 500 MG PO CAPS
500.0000 mg | ORAL_CAPSULE | Freq: Two times a day (BID) | ORAL | 0 refills | Status: AC
  Filled 2022-07-02: qty 10, 5d supply, fill #0

## 2022-07-02 NOTE — Telephone Encounter (Signed)
Pt's wife, Mariann Laster, called stating that his L groin had a large hematoma and some slight dehiscence of his incision.  Reviewed pt's chart, returned call for clarification, two identifiers used. Pt fell yesterday and a couple of days before that. The inside of his L thigh area has increased in size over the past 24' to about the size of a 0.50 piece. There is no bleeding or drainage from the incision. No other symptoms noted at this time. Spoke with Sam, PA and advised to bring pt into office for eval. Appt scheduled. Confirmed understanding.

## 2022-07-02 NOTE — Progress Notes (Signed)
POST OPERATIVE OFFICE NOTE    CC:  F/u for surgery  HPI:  This is a 71 y.o. male who is s/p Left common femoral artery to below-knee popliteal artery bypass with nonreversed translocated saphenous vein graft on 06/23/2022 by Dr. 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    Pt comes in today with his wife who is a Marine scientist, with c/o some swelling in the groin and separation of the incision.   Pt states the incision is sensitive.  He has not had any fevers.  He has had some swelling in the left leg that has been present since surgery.  He is elevating his legs.  His wife states he has some balance issues from his neuropathy and has to walk with a walker.  Due to his balance issues he has had a couple of falls.    Allergies  Allergen Reactions   Humibid La [Guaifenesin] Other (See Comments)    hallucinations    Current Outpatient Medications  Medication Sig Dispense Refill   aspirin EC 81 MG tablet Take 1 tablet (81 mg total) by mouth daily. 90 tablet 3   carboxymethylcellulose (REFRESH PLUS) 0.5 % SOLN Place 1 drop into both eyes 3 (three) times daily as needed (dry eyes).     empagliflozin (JARDIANCE) 10 MG TABS tablet Take 1 tablet (10 mg total) by mouth daily. 90 tablet 4   glimepiride (AMARYL) 4 MG tablet TAKE 2 TABLETS BY MOUTH ONCE A DAY 180 tablet 1   glucose blood (FREESTYLE LITE) test strip Use to check blood glucose 2 times daily 200 strip 4   ibuprofen (ADVIL,MOTRIN) 200 MG tablet Take 600-800 mg by mouth every 8 (eight) hours as needed for moderate pain.      insulin degludec (TRESIBA FLEXTOUCH) 100 UNIT/ML FlexTouch Pen Inject 8 Units into the skin daily in the evening 3 mL 5   Insulin Pen Needle (BD PEN NEEDLE NANO 2ND GEN) 32G X 4 MM MISC Use as directed once a day 100 each 6   Insulin Pen Needle 32G X 4 MM MISC Use as directed daily 50 each 6   lidocaine (LIDODERM) 5 % Apply 2 patches to affected area daily for up to 12 hours.   Remove for 12 hours & repeat as directed. 60 patch 11   metFORMIN (GLUCOPHAGE-XR) 500 MG 24 hr tablet TAKE 1 TABLET BY MOUTH TWO TIMES DAILY 60 tablet 6   metoCLOPramide (REGLAN) 5 MG tablet Take 1 tablet (5 mg total) by mouth 2 (two) times daily before a meal as needed 60 tablet 0   metoprolol succinate (TOPROL-XL) 25 MG 24 hr tablet Take 1 tablet (25 mg total) by mouth daily. 90 tablet 3   mirtazapine (REMERON) 15 MG tablet Take 1 tablet (15 mg total) by mouth at bedtime. 90 tablet 3   nitroGLYCERIN (NITROSTAT) 0.4 MG SL tablet Place 1 tablet (0.4 mg total) under the tongue every 5 (five) minutes as needed for chest pain. If no relief after 3 doses call 911 25 tablet 3   oxyCODONE-acetaminophen (PERCOCET/ROXICET) 5-325 MG tablet Take 1 tablet by mouth every 6 (six) hours as needed for moderate pain. 30 tablet 0   pantoprazole (PROTONIX) 40 MG tablet Take 1 tablet (40 mg total) by mouth 2 (two) times daily. 180 tablet 0   rosuvastatin (CRESTOR) 5 MG tablet Take 1 tablet (5 mg total) by mouth daily. 30 tablet 11   sertraline (ZOLOFT) 50 MG tablet Take  1 tablet by mouth once a day 30 tablet 0   sitaGLIPtin (JANUVIA) 100 MG tablet Take 1 tablet (100 mg total) by mouth daily. 90 tablet 4   Vibegron (GEMTESA) 75 MG TABS Take 75 mg by mouth daily.     VITAMIN D PO Take 1 tablet by mouth daily.     No current facility-administered medications for this visit.     ROS:  See HPI  Physical Exam:  Today's Vitals   07/02/22 1457  BP: 112/69  Pulse: 72  Resp: 18  Temp: 97.7 F (36.5 C)  TempSrc: Temporal  SpO2: 95%  Weight: 204 lb 3.2 oz (92.6 kg)  Height: '6\' 3"'$  (1.905 m)  PainSc: 7    Body mass index is 25.52 kg/m.   Incision:  superficial mild dehiscence of the left groin; left below knee incision with mild erythema        Extremities:  + doppler flow left PT/peroneal; + swelling left leg    Assessment/Plan:  This is a 71 y.o. male who is s/p: Left common femoral artery to  below-knee popliteal artery bypass with nonreversed translocated saphenous vein graft on 06/23/2022 by Dr. 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  -pt seen with Dr. Scot Dock.  Steri strips placed above and below superficial area of dehiscence in left groin.  Okay to keep dry or ok to put a small amount of bacitracin and band-aid over the area.  His left groin is soft and may have small hematoma as well as a healing ridge that should smooth out over the next few months.  Continue to clean with soap and water and pat dry.   Dry gauze placed over left groin.  -pt with some swelling left leg that is not unexpected as he has undergone vein harvest as well as reperfusion swelling.  Continue leg elevation.   -pt with some mild erythema around distal incision.  Pt has not had any fevers.  Will send 5 days of Keflex (this was sent to the [facility] outpatient pharmacy).  Fortunately, his bypass is a vein bypass.  -pt with gangrene of left great toe.  Area is dry.  They have appt with podiatry on Saturday. -they will keep their follow up appt on 10/26 for post op visit.     Leontine Locket, [facility] [phone]   Clinic MD:  Scot Dock

## 2022-07-03 ENCOUNTER — Other Ambulatory Visit (HOSPITAL_COMMUNITY): Payer: Self-pay

## 2022-07-03 MED ORDER — TECHLITE PEN NEEDLES 32G X 4 MM MISC
6 refills | Status: DC
  Filled 2022-07-03: qty 100, 100d supply, fill #0
  Filled 2022-10-26: qty 100, 100d supply, fill #1
  Filled 2023-03-20: qty 100, 100d supply, fill #2

## 2022-07-04 ENCOUNTER — Ambulatory Visit (INDEPENDENT_AMBULATORY_CARE_PROVIDER_SITE_OTHER): Payer: HMO

## 2022-07-04 ENCOUNTER — Ambulatory Visit (INDEPENDENT_AMBULATORY_CARE_PROVIDER_SITE_OTHER): Payer: HMO | Admitting: Podiatry

## 2022-07-04 DIAGNOSIS — I739 Peripheral vascular disease, unspecified: Secondary | ICD-10-CM | POA: Diagnosis not present

## 2022-07-04 DIAGNOSIS — I96 Gangrene, not elsewhere classified: Secondary | ICD-10-CM

## 2022-07-04 NOTE — Progress Notes (Signed)
Subjective: Chief Complaint  Patient presents with   gangrene     Left foot great toe. Patient denies vomiting, fever, chills. Denies pain in the foot today. Patient did not voice any concerns at today's visit.    71 year old male presents with above concerns.  States the wound is doing about the same.  Denies any drainage or pus.  He is on antibiotics currently for groin incision.  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  Left common femoral to below-knee popliteal artery bypass on June 23, 2022.  Discharge from the hospital June 26, 2022.  Follow-up with vascular surgery on July 02, 2022.   Objective: AAO x3, NAD DP/PT pulses palpable bilaterally, CRT less than 3 seconds Gangrenous changes present to the distal aspect left hallux incision dry.  No drainage or pus.  There is edema to the foot since the surgery there is no erythema or warmth.  No fluctuation or crepitation.  There is no malodor. No pain with calf compression, swelling, warmth, erythema  Assessment: Gangrene left hallux  Plan: -All treatment options discussed with the patient including all alternatives, risks, complications.  -X-rays were obtained and reviewed.  3 views were obtained.  No definitive evidence of acute fracture, osteomyelitis and there is no soft tissue emphysema. -We discussed conservative care versus surgical intervention to do a partial amputation of the hallux.  He states he is still try to get over his bypass surgery has already fallen twice and he wants to hold off on surgery for right now.  He.  Clean and dry and apply a small amount of Betadine wound.  Discussed monitoring for any signs or symptoms of infection report emergency room should any occur. -Patient encouraged to call the office with any questions, concerns, change in symptoms.   Return  in about 2 weeks (around 07/18/2022) for gangrene .  Trula Slade DPM

## 2022-07-06 ENCOUNTER — Other Ambulatory Visit (HOSPITAL_COMMUNITY): Payer: Self-pay

## 2022-07-07 ENCOUNTER — Other Ambulatory Visit (HOSPITAL_COMMUNITY): Payer: Self-pay

## 2022-07-07 MED ORDER — JANUVIA 100 MG PO TABS
100.0000 mg | ORAL_TABLET | Freq: Every day | ORAL | 4 refills | Status: DC
  Filled 2022-07-07: qty 30, 30d supply, fill #0
  Filled 2022-08-02: qty 30, 30d supply, fill #1
  Filled 2022-08-27: qty 30, 30d supply, fill #2
  Filled 2022-09-26: qty 30, 30d supply, fill #3
  Filled 2022-10-16 – 2022-10-19 (×3): qty 30, 30d supply, fill #4
  Filled 2022-11-13: qty 30, 30d supply, fill #5
  Filled 2022-12-10 – 2022-12-12 (×4): qty 30, 30d supply, fill #6
  Filled 2023-01-05 – 2023-01-07 (×2): qty 30, 30d supply, fill #7
  Filled 2023-02-02: qty 30, 30d supply, fill #8
  Filled 2023-03-04: qty 30, 30d supply, fill #9
  Filled 2023-03-27: qty 30, 30d supply, fill #10
  Filled 2023-04-24 (×2): qty 30, 30d supply, fill #11
  Filled 2023-06-03: qty 30, 30d supply, fill #12

## 2022-07-08 ENCOUNTER — Other Ambulatory Visit (HOSPITAL_COMMUNITY): Payer: Self-pay

## 2022-07-08 MED ORDER — METOCLOPRAMIDE HCL 5 MG PO TABS
5.0000 mg | ORAL_TABLET | Freq: Two times a day (BID) | ORAL | 0 refills | Status: DC | PRN
  Filled 2022-07-08: qty 60, 30d supply, fill #0

## 2022-07-09 ENCOUNTER — Encounter: Payer: Self-pay | Admitting: Internal Medicine

## 2022-07-09 ENCOUNTER — Ambulatory Visit (INDEPENDENT_AMBULATORY_CARE_PROVIDER_SITE_OTHER): Payer: HMO | Admitting: Internal Medicine

## 2022-07-09 ENCOUNTER — Encounter: Payer: Self-pay | Admitting: *Deleted

## 2022-07-09 ENCOUNTER — Other Ambulatory Visit (HOSPITAL_COMMUNITY): Payer: Self-pay

## 2022-07-09 VITALS — BP 100/60 | HR 87 | Ht 75.0 in | Wt 200.1 lb

## 2022-07-09 DIAGNOSIS — R933 Abnormal findings on diagnostic imaging of other parts of digestive tract: Secondary | ICD-10-CM

## 2022-07-09 DIAGNOSIS — F5104 Psychophysiologic insomnia: Secondary | ICD-10-CM

## 2022-07-09 DIAGNOSIS — R110 Nausea: Secondary | ICD-10-CM | POA: Diagnosis not present

## 2022-07-09 DIAGNOSIS — K222 Esophageal obstruction: Secondary | ICD-10-CM

## 2022-07-09 MED ORDER — MIRTAZAPINE 15 MG PO TABS
15.0000 mg | ORAL_TABLET | Freq: Every day | ORAL | 3 refills | Status: DC
  Filled 2022-07-09: qty 90, 90d supply, fill #0
  Filled 2022-11-22: qty 90, 90d supply, fill #1
  Filled 2023-03-17: qty 90, 90d supply, fill #2
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
## 2022-07-09 NOTE — Patient Instructions (Addendum)
We have sent the following medications to your pharmacy for you to pick up at your convenience: Mirtazapine  Due to recent changes in healthcare laws, you may see the results of your imaging and laboratory studies on MyChart before your provider has had a chance to review them.  We understand that in some cases there may be results that are confusing or concerning to you. Not all laboratory results come back in the same time frame and the provider may be waiting for multiple results in order to interpret others.  Please give [place] 48 hours in order for your provider to thoroughly review all the results before contacting the office for clarification of your results.   I appreciate the opportunity to care for you. Silvano Rusk, MD, [facility]

## 2022-07-10 ENCOUNTER — Other Ambulatory Visit (HOSPITAL_COMMUNITY): Payer: Self-pay

## 2022-07-15 ENCOUNTER — Other Ambulatory Visit (HOSPITAL_COMMUNITY): Payer: Self-pay

## 2022-07-15 NOTE — Progress Notes (Deleted)
POST OPERATIVE OFFICE NOTE    CC:  F/u for surgery  HPI:  This is a 71 y.o. male who is s/p *** on *** by Dr. Marland Kitchen    Pt returns today for follow up.  Pt states ***   Allergies  Allergen Reactions   Humibid La [Guaifenesin] Other (See Comments)    hallucinations    Current Outpatient Medications  Medication Sig Dispense Refill   aspirin EC 81 MG tablet Take 1 tablet (81 mg total) by mouth daily. 90 tablet 3   carboxymethylcellulose (REFRESH PLUS) 0.5 % SOLN Place 1 drop into both eyes 3 (three) times daily as needed (dry eyes).     empagliflozin (JARDIANCE) 10 MG TABS tablet Take 1 tablet (10 mg total) by mouth daily. (Patient not taking: Reported on 07/09/2022) 90 tablet 4   glimepiride (AMARYL) 4 MG tablet TAKE 2 TABLETS BY MOUTH ONCE A DAY 180 tablet 1   glucose blood (FREESTYLE LITE) test strip Use to check blood glucose 2 times daily 200 strip 4   ibuprofen (ADVIL,MOTRIN) 200 MG tablet Take 600-800 mg by mouth every 8 (eight) hours as needed for moderate pain.      insulin degludec (TRESIBA FLEXTOUCH) 100 UNIT/ML FlexTouch Pen Inject 8 Units into the skin daily in the evening 3 mL 5   Insulin Pen Needle (BD PEN NEEDLE NANO 2ND GEN) 32G X 4 MM MISC Use as directed once a day 100 each 6   Insulin Pen Needle (TECHLITE PEN NEEDLES) 32G X 4 MM MISC Use daily as directed 50 each 6   lidocaine (LIDODERM) 5 % Apply 2 patches to affected area daily for up to 12 hours.  Remove for 12 hours & repeat as directed. 60 patch 11   metFORMIN (GLUCOPHAGE-XR) 500 MG 24 hr tablet TAKE 1 TABLET BY MOUTH TWO TIMES DAILY 60 tablet 6   metoCLOPramide (REGLAN) 5 MG tablet Take 1 tablet (5 mg total) by mouth 2 (two) times daily before a meal as needed. 60 tablet 0   metoprolol succinate (TOPROL-XL) 25 MG 24 hr tablet Take 1 tablet (25 mg total) by mouth daily. 90 tablet 3   mirtazapine (REMERON) 15 MG tablet Take 1 tablet (15 mg total) by mouth at bedtime. 90 tablet 3   nitroGLYCERIN (NITROSTAT) 0.4 MG  SL tablet Place 1 tablet (0.4 mg total) under the tongue every 5 (five) minutes as needed for chest pain. If no relief after 3 doses call 911 25 tablet 3   oxyCODONE-acetaminophen (PERCOCET/ROXICET) 5-325 MG tablet Take 1 tablet by mouth every 6 (six) hours as needed for moderate pain. 30 tablet 0   pantoprazole (PROTONIX) 40 MG tablet Take 1 tablet (40 mg total) by mouth 2 (two) times daily. 180 tablet 0   rosuvastatin (CRESTOR) 5 MG tablet Take 1 tablet (5 mg total) by mouth daily. 30 tablet 11   sertraline (ZOLOFT) 50 MG tablet Take 1 tablet by mouth once a day 30 tablet 0   sitaGLIPtin (JANUVIA) 100 MG tablet Take 1 tablet (100 mg total) by mouth daily. 90 tablet 4   Vibegron (GEMTESA) 75 MG TABS Take 75 mg by mouth daily.     VITAMIN D PO Take 1 tablet by mouth daily.     No current facility-administered medications for this visit.     ROS:  See HPI  Physical Exam:  ***  Incision:  *** Extremities:  *** Neuro: *** Abdomen:  ***    Assessment/Plan:  This is a 71  y.o. male who is s/p: ***  -***   Vicente Serene, PA-C [facility] [phone]   Clinic MD:  ***

## 2022-07-16 DIAGNOSIS — M461 Sacroiliitis, not elsewhere classified: Secondary | ICD-10-CM | POA: Insufficient documentation

## 2022-07-16 DIAGNOSIS — M7918 Myalgia, other site: Secondary | ICD-10-CM | POA: Insufficient documentation

## 2022-07-17 ENCOUNTER — Ambulatory Visit: Payer: HMO | Admitting: Podiatry

## 2022-07-22 ENCOUNTER — Other Ambulatory Visit (HOSPITAL_COMMUNITY): Payer: Self-pay

## 2022-07-22 NOTE — Progress Notes (Deleted)
POST OPERATIVE OFFICE NOTE    CC:  F/u for surgery  HPI:  This is a 71 y.o. male who is s/p Left common femoral artery to below-knee popliteal artery bypass with nonreversed translocated saphenous vein graft on 06/23/2022 by Dr. 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   Pt was seen on 07/02/2022 and at that time, he had a superficial dehiscence in the left groin and we placed steri strips above and below this area.  He did possibly have a small hematoma vs healing ridge that should smooth out over time.  He was encouraged to continue soap and water and then keep groin incision dry with gauze.  He was having some reperfusion swelling of the left leg as he had vein harvest as well and that is not unexpected.  He was encouraged to continue with leg elevation.  He did have some mild erythema and he was placed on 5 days of prophylactic abx.  He had not had fevers.  They had f/u appt with podiatry for the gangrene of his toe.   Pt returns today for follow up.  Pt states ***   Allergies  Allergen Reactions   Humibid La [Guaifenesin] Other (See Comments)    hallucinations    Current Outpatient Medications  Medication Sig Dispense Refill   aspirin EC 81 MG tablet Take 1 tablet (81 mg total) by mouth daily. 90 tablet 3   carboxymethylcellulose (REFRESH PLUS) 0.5 % SOLN Place 1 drop into both eyes 3 (three) times daily as needed (dry eyes).     empagliflozin (JARDIANCE) 10 MG TABS tablet Take 1 tablet (10 mg total) by mouth daily. (Patient not taking: Reported on 07/09/2022) 90 tablet 4   glimepiride (AMARYL) 4 MG tablet TAKE 2 TABLETS BY MOUTH ONCE A DAY 180 tablet 1   glucose blood (FREESTYLE LITE) test strip Use to check blood glucose 2 times daily 200 strip 4   ibuprofen (ADVIL,MOTRIN) 200 MG tablet Take 600-800 mg by mouth every 8 (eight) hours as needed for moderate pain.      insulin degludec (TRESIBA FLEXTOUCH) 100 UNIT/ML FlexTouch Pen  Inject 8 Units into the skin daily in the evening 3 mL 5   Insulin Pen Needle (BD PEN NEEDLE NANO 2ND GEN) 32G X 4 MM MISC Use as directed once a day 100 each 6   Insulin Pen Needle (TECHLITE PEN NEEDLES) 32G X 4 MM MISC Use daily as directed 50 each 6   lidocaine (LIDODERM) 5 % Apply 2 patches to affected area daily for up to 12 hours.  Remove for 12 hours & repeat as directed. 60 patch 11   metFORMIN (GLUCOPHAGE-XR) 500 MG 24 hr tablet TAKE 1 TABLET BY MOUTH TWO TIMES DAILY 60 tablet 6   metoCLOPramide (REGLAN) 5 MG tablet Take 1 tablet (5 mg total) by mouth 2 (two) times daily before a meal as needed. 60 tablet 0   metoprolol succinate (TOPROL-XL) 25 MG 24 hr tablet Take 1 tablet (25 mg total) by mouth daily. 90 tablet 3   mirtazapine (REMERON) 15 MG tablet Take 1 tablet (15 mg total) by mouth at bedtime. 90 tablet 3   nitroGLYCERIN (NITROSTAT) 0.4 MG SL tablet Place 1 tablet (0.4 mg total) under the tongue every 5 (five) minutes as needed for chest pain. If no relief after 3 doses call 911 25 tablet 3   oxyCODONE-acetaminophen (PERCOCET/ROXICET) 5-325 MG tablet Take 1 tablet by mouth every 6 (six) hours  as needed for moderate pain. 30 tablet 0   pantoprazole (PROTONIX) 40 MG tablet Take 1 tablet (40 mg total) by mouth 2 (two) times daily. 180 tablet 0   rosuvastatin (CRESTOR) 5 MG tablet Take 1 tablet (5 mg total) by mouth daily. 30 tablet 11   sertraline (ZOLOFT) 50 MG tablet Take 1 tablet by mouth once a day 30 tablet 0   sitaGLIPtin (JANUVIA) 100 MG tablet Take 1 tablet (100 mg total) by mouth daily. 90 tablet 4   Vibegron (GEMTESA) 75 MG TABS Take 75 mg by mouth daily.     VITAMIN D PO Take 1 tablet by mouth daily.     No current facility-administered medications for this visit.     ROS:  See HPI  Physical Exam:  ***  Incision:  *** Extremities:  *** Neuro: *** Abdomen:  ***    Assessment/Plan:  This is a 71 y.o. male who is s/p: Left common femoral artery to below-knee  popliteal artery bypass with nonreversed translocated saphenous vein graft on 06/23/2022 by Dr. 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   -***   Leontine Locket, [facility] [phone]   Clinic MD:  Scot Dock

## 2022-07-23 ENCOUNTER — Other Ambulatory Visit (HOSPITAL_COMMUNITY): Payer: Self-pay

## 2022-07-24 ENCOUNTER — Other Ambulatory Visit (HOSPITAL_COMMUNITY): Payer: Self-pay

## 2022-07-25 ENCOUNTER — Other Ambulatory Visit (HOSPITAL_COMMUNITY): Payer: Self-pay

## 2022-07-25 MED ORDER — GLUCOSE BLOOD VI STRP
1.0000 | ORAL_STRIP | Freq: Two times a day (BID) | 3 refills | Status: AC
  Filled 2022-07-25: qty 100, 50d supply, fill #0
  Filled 2022-09-29: qty 100, 50d supply, fill #1
  Filled 2022-12-25: qty 100, 50d supply, fill #2
  Filled 2023-02-22: qty 100, 50d supply, fill #3
  Filled 2023-05-28 – 2023-05-29 (×2): qty 100, 50d supply, fill #4
  Filled 2023-07-01 – 2023-07-25 (×2): qty 100, 50d supply, fill #5

## 2022-07-27 ENCOUNTER — Other Ambulatory Visit (HOSPITAL_COMMUNITY): Payer: Self-pay

## 2022-07-29 ENCOUNTER — Other Ambulatory Visit (HOSPITAL_COMMUNITY): Payer: Self-pay

## 2022-07-30 ENCOUNTER — Ambulatory Visit (INDEPENDENT_AMBULATORY_CARE_PROVIDER_SITE_OTHER): Payer: HMO | Admitting: Physician Assistant

## 2022-07-30 ENCOUNTER — Other Ambulatory Visit: Payer: Self-pay

## 2022-07-30 DIAGNOSIS — I739 Peripheral vascular disease, unspecified: Secondary | ICD-10-CM

## 2022-07-30 NOTE — Progress Notes (Signed)
POST OPERATIVE OFFICE NOTE    CC:  F/u for surgery  HPI:  Justin Key is a 71 y.o. male who is s/p left common femoral artery to below-knee popliteal artery bypass with nonreversed translocated greater saphenous vein on 06/23/2022 by Dr. Scot Dock.  This was done for gangrene of the left great toe with severe infrainguinal arterial occlusive disease.  At the patient's last visit with [place] on 07/02/2022, he had a superficial dehiscence of his left groin incision.  He also had some superficial dehiscence and erythema of the most distal left lower extremity incision.  He denied any drainage or fevers.  He was to keep his incisions clean daily.  He was also started on a 5-day course of Keflex to treat the erythema of the most distal left lower extremity incision.  Today at follow-up, his incisions are doing much better.  His most distal left lower extremity incision has fully closed with no signs of infection.  His left groin incision has almost fully healed with one pinhole opening.  He denies any drainage, fevers, or erythema of the area.  He has been keeping all of his incisions clean.  He finished his course of Keflex.  He denies any claudication or rest pain.  His left great toe gangrene has improved, however there are still plans in motion with podiatry for possible surgery.   Allergies  Allergen Reactions   Humibid La [Guaifenesin] Other (See Comments)    hallucinations    Current Outpatient Medications  Medication Sig Dispense Refill   aspirin EC 81 MG tablet Take 1 tablet (81 mg total) by mouth daily. 90 tablet 3   carboxymethylcellulose (REFRESH PLUS) 0.5 % SOLN Place 1 drop into both eyes 3 (three) times daily as needed (dry eyes).     empagliflozin (JARDIANCE) 10 MG TABS tablet Take 1 tablet (10 mg total) by mouth daily. 90 tablet 4   glimepiride (AMARYL) 4 MG tablet TAKE 2 TABLETS BY MOUTH ONCE A DAY 180 tablet 1   glucose blood (FREESTYLE LITE) test strip Use to check blood  glucose 2 times daily 200 strip 4   glucose blood test strip use 1 strip to check blood glucose 2 (two) times daily. 200 each 3   ibuprofen (ADVIL,MOTRIN) 200 MG tablet Take 600-800 mg by mouth every 8 (eight) hours as needed for moderate pain.      insulin degludec (TRESIBA FLEXTOUCH) 100 UNIT/ML FlexTouch Pen Inject 8 Units into the skin daily in the evening 3 mL 5   Insulin Pen Needle (BD PEN NEEDLE NANO 2ND GEN) 32G X 4 MM MISC Use as directed once a day 100 each 6   Insulin Pen Needle (TECHLITE PEN NEEDLES) 32G X 4 MM MISC Use daily as directed 50 each 6   lidocaine (LIDODERM) 5 % Apply 2 patches to affected area daily for up to 12 hours.  Remove for 12 hours & repeat as directed. 60 patch 11   metFORMIN (GLUCOPHAGE-XR) 500 MG 24 hr tablet TAKE 1 TABLET BY MOUTH TWO TIMES DAILY 60 tablet 6   metoCLOPramide (REGLAN) 5 MG tablet Take 1 tablet (5 mg total) by mouth 2 (two) times daily before a meal as needed. 60 tablet 0   metoprolol succinate (TOPROL-XL) 25 MG 24 hr tablet Take 1 tablet (25 mg total) by mouth daily. 90 tablet 3   mirtazapine (REMERON) 15 MG tablet Take 1 tablet (15 mg total) by mouth at bedtime. 90 tablet 3   oxyCODONE-acetaminophen (PERCOCET/ROXICET) 5-325 MG  tablet Take 1 tablet by mouth every 6 (six) hours as needed for moderate pain. 30 tablet 0   pantoprazole (PROTONIX) 40 MG tablet Take 1 tablet (40 mg total) by mouth 2 (two) times daily. 180 tablet 0   rosuvastatin (CRESTOR) 5 MG tablet Take 1 tablet (5 mg total) by mouth daily. 30 tablet 11   sertraline (ZOLOFT) 50 MG tablet Take 1 tablet by mouth once a day 30 tablet 0   sitaGLIPtin (JANUVIA) 100 MG tablet Take 1 tablet (100 mg total) by mouth daily. 90 tablet 4   traMADol (ULTRAM) 50 MG tablet Take 50 mg by mouth every 6 (six) hours as needed.     Vibegron (GEMTESA) 75 MG TABS Take 75 mg by mouth daily.     VITAMIN D PO Take 1 tablet by mouth daily.     nitroGLYCERIN (NITROSTAT) 0.4 MG SL tablet Place 1 tablet (0.4 mg  total) under the tongue every 5 (five) minutes as needed for chest pain. If no relief after 3 doses call 911 25 tablet 3   No current facility-administered medications for this visit.     ROS:  See HPI  Physical Exam:  Incision: Left lower extremity incisions well-healed.  Left groin incision mostly healed with 1 pinhole opening, no drainage or erythema Extremities: Brisk left PT and peroneal Doppler signals.  Left great toe dry gangrene Neuro: intact   Assessment/Plan:  This is a 71 y.o. male who is s/p: Left common femoral artery to below-knee popliteal artery bypass with vein on 06/23/2022, done for gangrene of the left great toe   -Left lower extremity incisions are all well-healed now with no signs of infection or dehiscence. -Left groin incision mostly healed now, with one area of pinhole opening.  This area has not had any drainage, redness, or bleeding.  No concern for infection at this time.  I have encouraged the patient to continue cleaning this area daily and keep a gauze or Band-Aid over the area. -He has brisk left PT and peroneal Doppler signals -Left great toe dry gangrene. He is seeing podiatry for this and is not ready for surgery at this time -He can follow-up with [place] in 4 weeks for left groin incision check with ABIs and left bypass graft duplex study   Vicente Serene, PA-C [facility] [phone]   Clinic MD:  Scot Dock

## 2022-07-31 ENCOUNTER — Other Ambulatory Visit (HOSPITAL_COMMUNITY): Payer: Self-pay

## 2022-07-31 MED ORDER — METOCLOPRAMIDE HCL 5 MG PO TABS
ORAL_TABLET | ORAL | 0 refills | Status: DC
  Filled 2022-07-31: qty 60, 30d supply, fill #0

## 2022-08-01 ENCOUNTER — Other Ambulatory Visit (HOSPITAL_COMMUNITY): Payer: Self-pay

## 2022-08-03 ENCOUNTER — Other Ambulatory Visit (HOSPITAL_COMMUNITY): Payer: Self-pay

## 2022-08-03 MED ORDER — SERTRALINE HCL 50 MG PO TABS
50.0000 mg | ORAL_TABLET | Freq: Every day | ORAL | 0 refills | Status: DC
  Filled 2022-08-03: qty 30, 30d supply, fill #0

## 2022-08-04 ENCOUNTER — Other Ambulatory Visit (HOSPITAL_COMMUNITY): Payer: Self-pay

## 2022-08-12 ENCOUNTER — Other Ambulatory Visit (HOSPITAL_COMMUNITY): Payer: Self-pay

## 2022-08-17 ENCOUNTER — Ambulatory Visit (INDEPENDENT_AMBULATORY_CARE_PROVIDER_SITE_OTHER): Payer: HMO | Admitting: Podiatry

## 2022-08-17 ENCOUNTER — Other Ambulatory Visit (HOSPITAL_COMMUNITY): Payer: Self-pay

## 2022-08-17 ENCOUNTER — Ambulatory Visit (INDEPENDENT_AMBULATORY_CARE_PROVIDER_SITE_OTHER): Payer: HMO

## 2022-08-17 VITALS — BP 140/68 | HR 76 | Temp 96.6°F

## 2022-08-17 DIAGNOSIS — I96 Gangrene, not elsewhere classified: Secondary | ICD-10-CM

## 2022-08-17 DIAGNOSIS — M86172 Other acute osteomyelitis, left ankle and foot: Secondary | ICD-10-CM

## 2022-08-17 MED ORDER — AMOXICILLIN-POT CLAVULANATE 875-125 MG PO TABS
1.0000 | ORAL_TABLET | Freq: Two times a day (BID) | ORAL | 0 refills | Status: DC
  Filled 2022-08-17: qty 20, 10d supply, fill #0

## 2022-08-17 NOTE — Patient Instructions (Signed)
Pre-Operative Instructions  Congratulations, you have decided to take an important step to improving your quality of life.  You can be assured that the doctors of [facility] will be with you every step of the way.  Plan to be at the surgery center/hospital at least 1 (one) hour prior to your scheduled time unless otherwise directed by the surgical center/hospital staff.  You must have a responsible adult accompany you, remain during the surgery and drive you home.  Make sure you have directions to the surgical center/hospital and know how to get there on time. For hospital based surgery you will need to obtain a history and physical form from your family physician within 1 month prior to the date of surgery- we will give you a form for you primary physician.  We make every effort to accommodate the date you request for surgery.  There are however, times where surgery dates or times have to be moved.  We will contact you as soon as possible if a change in schedule is required.   No Aspirin/Ibuprofen for one week before surgery.  If you are on aspirin, any non-steroidal anti-inflammatory medications (Mobic, Aleve, Ibuprofen) you should stop taking it 7 days prior to your surgery.  You make take Tylenol  For pain prior to surgery.  Medications- If you are taking daily heart and blood pressure medications, seizure, reflux, allergy, asthma, anxiety, pain or diabetes medications, make sure the surgery center/hospital is aware before the day of surgery so they may notify you which medications to take or avoid the day of surgery. No food or drink after midnight the night before surgery unless directed otherwise by surgical center/hospital staff. No alcoholic beverages 24 hours prior to surgery.  No smoking 24 hours prior to or 24 hours after surgery. Wear loose pants or shorts- loose enough to fit over bandages, boots, and casts. No slip on shoes, sneakers are best. Bring your boot with you to the  surgery center/hospital.  Also bring crutches or a walker if your physician has prescribed it for you.  If you do not have this equipment, it will be provided for you after surgery. If you have not been contracted by the surgery center/hospital by the day before your surgery, call to confirm the date and time of your surgery. Leave-time from work may vary depending on the type of surgery you have.  Appropriate arrangements should be made prior to surgery with your employer. Prescriptions will be provided immediately following surgery by your doctor.  Have these filled as soon as possible after surgery and take the medication as directed. Remove nail polish on the operative foot. Wash the night before surgery.  The night before surgery wash the foot and leg well with the antibacterial soap provided and water paying special attention to beneath the toenails and in between the toes.  Rinse thoroughly with water and dry well with a towel.  Perform this wash unless told not to do so by your physician.  Enclosed: 1 Ice pack (please put in freezer the night before surgery)   1 Hibiclens skin cleaner   Pre-op Instructions  If you have any questions regarding the instructions, do not hesitate to call our office at any point during this process.   [place]: [address] [place], [place] [address] [phone]  [place]: [address] [place], [place] [address] [phone]  Dr. Ona Roehrs, DPM  

## 2022-08-18 DIAGNOSIS — E7800 Pure hypercholesterolemia, unspecified: Secondary | ICD-10-CM | POA: Diagnosis not present

## 2022-08-18 DIAGNOSIS — Z01818 Encounter for other preprocedural examination: Secondary | ICD-10-CM | POA: Diagnosis not present

## 2022-08-18 DIAGNOSIS — I2510 Atherosclerotic heart disease of native coronary artery without angina pectoris: Secondary | ICD-10-CM | POA: Diagnosis not present

## 2022-08-18 DIAGNOSIS — E1169 Type 2 diabetes mellitus with other specified complication: Secondary | ICD-10-CM | POA: Diagnosis not present

## 2022-08-18 DIAGNOSIS — Z6826 Body mass index (BMI) 26.0-26.9, adult: Secondary | ICD-10-CM | POA: Diagnosis not present

## 2022-08-18 DIAGNOSIS — I739 Peripheral vascular disease, unspecified: Secondary | ICD-10-CM | POA: Diagnosis not present

## 2022-08-18 DIAGNOSIS — I10 Essential (primary) hypertension: Secondary | ICD-10-CM | POA: Diagnosis not present

## 2022-08-18 NOTE — Progress Notes (Signed)
Spoke with Bess Harvest to request orders in epic.

## 2022-08-18 NOTE — Progress Notes (Signed)
COVID Vaccine Completed:  Date of COVID positive in last 90 days:  PCP -  Kathyrn Lass, MD Cardiologist - Daneen Schick, MD  Chest x-ray - 04-28-22 Epic EKG - 04-29-22 Epic Stress Test - 06-05-22 Epic ECHO -  Cardiac Cath - 2019 Epic Pacemaker/ICD device last checked: Spinal Cord Stimulator:  Bowel Prep -   Sleep Study -  CPAP -   Fasting Blood Sugar -  Checks Blood Sugar _____ times a day  Last dose of GLP1 agonist-  N/A GLP1 instructions:  N/A   Last dose of SGLT-2 inhibitors-  N/A SGLT-2 instructions: N/A   Blood Thinner Instructions: Aspirin Instructions: Last Dose:  Activity level:  Can go up a flight of stairs and perform activities of daily living without stopping and without symptoms of chest pain or shortness of breath.  Able to exercise without symptoms  Unable to go up a flight of stairs without symptoms of     Anesthesia review:   CAD, Hx of MI,  HTN, PAD, DM  Patient denies shortness of breath, fever, cough and chest pain at PAT appointment  Patient verbalized understanding of instructions that were given to them at the PAT appointment. Patient was also instructed that they will need to review over the PAT instructions again at home before surgery.

## 2022-08-18 NOTE — Patient Instructions (Signed)
SURGICAL WAITING ROOM VISITATION Patients having surgery or a procedure may have no more than 2 support people in the waiting area - these visitors may rotate.   Children under the age of 12 must have an adult with them who is not the patient. If the patient needs to stay at the hospital during part of their recovery, the visitor guidelines for inpatient rooms apply. Pre-op nurse will coordinate an appropriate time for 1 support person to accompany patient in pre-op.  This support person may not rotate.    Please refer to the [facility] website for the visitor guidelines for Inpatients (after your surgery is over and you are in a regular room).      Your procedure is scheduled on: 08-21-22   Report to [facility] Main Entrance    Report to admitting at 9:45 AM   Call this number if you have problems the morning of surgery [phone]   Do not eat food or drink liquids :After Midnight.  If you have questions, please contact your surgeon's office.   FOLLOW  ANY ADDITIONAL PRE OP INSTRUCTIONS YOU RECEIVED FROM YOUR SURGEON'S OFFICE!!!     Oral Hygiene is also important to reduce your risk of infection.                                    Remember - BRUSH YOUR TEETH THE MORNING OF SURGERY WITH YOUR REGULAR TOOTHPASTE   Do NOT smoke after Midnight   Take these medicines the morning of surgery with A SIP OF WATER: Augmentin Metoprolol Metoclopramide Pantoprazole Rosuvastatin Sertraline Tramadol if needed   How to Manage Your Diabetes Before and After Surgery  Why is it important to control my blood sugar before and after surgery? Improving blood sugar levels before and after surgery helps healing and can limit problems. A way of improving blood sugar control is eating a healthy diet by:  Eating less sugar and carbohydrates  Increasing activity/exercise  Talking with your doctor about reaching your blood sugar goals High blood sugars (greater than 180 mg/dL) can raise  your risk of infections and slow your recovery, so you will need to focus on controlling your diabetes during the weeks before surgery. Make sure that the doctor who takes care of your diabetes knows about your planned surgery including the date and location.  How do I manage my blood sugar before surgery? Check your blood sugar at least 4 times a day, starting 2 days before surgery, to make sure that the level is not too high or low. Check your blood sugar the morning of your surgery when you wake up and every 2 hours until you get to the Short Stay unit. If your blood sugar is less than 70 mg/dL, you will need to treat for low blood sugar: Do not take insulin. Treat a low blood sugar (less than 70 mg/dL) with  cup of clear juice (cranberry or apple), 4 glucose tablets, OR glucose gel. Recheck blood sugar in 15 minutes after treatment (to make sure it is greater than 70 mg/dL). If your blood sugar is not greater than 70 mg/dL on recheck, call [phone] for further instructions. Report your blood sugar to the short stay nurse when you get to Short Stay.  If you are admitted to the hospital after surgery: Your blood sugar will be checked by the staff and you will probably be given insulin after surgery (  instead of oral diabetes medicines) to make sure you have good blood sugar levels. The goal for blood sugar control after surgery is 80-180 mg/dL.   WHAT DO I DO ABOUT MY DIABETES MEDICATION?  Do not take oral diabetes medicines (pills) the morning of surgery.  THE NIGHT BEFORE SURGERY:  Take 50% of [place].      DO NOT TAKE THE FOLLOWING 7 DAYS PRIOR TO SURGERY: Ozempic, Wegovy, Rybelsus (Semaglutide), Byetta (exenatide), Bydureon (exenatide ER), Victoza, Saxenda (liraglutide), or Trulicity (dulaglutide) Mounjaro (Tirzepatide) Adlyxin (Lixisenatide), Polyethylene Glycol Loxenatide.  Reviewed and Endorsed by [facility] Patient Education Committee, August 2015  Bring CPAP mask and tubing  day of surgery.                              You may not have any metal on your body including  jewelry, and body piercing             Do not wear lotions, powders, cologne, or deodorant              Men may shave face and neck.   Do not bring valuables to the hospital. [place].   Contacts, dentures or bridgework may not be worn into surgery.  DO NOT [employer]. PHARMACY WILL DISPENSE MEDICATIONS LISTED ON YOUR MEDICATION LIST TO YOU DURING YOUR ADMISSION [place]!    Patients discharged on the day of surgery will not be allowed to drive home.  Someone NEEDS to stay with you for the first 24 hours after anesthesia.   Special Instructions: Bring a copy of your healthcare power of attorney and living will documents the day of surgery if you haven't scanned them before.              Please read over the following fact sheets you were given: IF [place]  If you received a COVID test during your pre-op visit  it is requested that you wear a mask when out in public, stay away from anyone that may not be feeling well and notify your surgeon if you develop symptoms. If you test positive for Covid or have been in contact with anyone that has tested positive in the last 10 days please notify you surgeon.  [place] - Preparing for Surgery Before surgery, you can play an important role.  Because skin is not sterile, your skin needs to be as free of germs as possible.  You can reduce the number of germs on your skin by washing with CHG (chlorahexidine gluconate) soap before surgery.  CHG is an antiseptic cleaner which kills germs and bonds with the skin to continue killing germs even after washing. Please DO NOT use if you have an allergy to CHG or antibacterial soaps.  If your skin becomes reddened/irritated stop using the CHG and inform your nurse when  you arrive at Short Stay. Do not shave (including legs and underarms) for at least 48 hours prior to the first CHG shower.  You may shave your face/neck.  Please follow these instructions carefully:  1.  Shower with CHG Soap the night before surgery and the  morning of surgery.  2.  If you choose to wash your hair, wash your hair first as usual with your normal  shampoo.  3.  After you shampoo, rinse  your hair and body thoroughly to remove the shampoo.                             4.  Use CHG as you would any other liquid soap.  You can apply chg directly to the skin and wash.  Gently with a scrungie or clean washcloth.  5.  Apply the CHG Soap to your body ONLY FROM THE NECK DOWN.   Do   not use on face/ open                           Wound or open sores. Avoid contact with eyes, ears mouth and   genitals (private parts).                       Wash face,  Genitals (private parts) with your normal soap.             6.  Wash thoroughly, paying special attention to the area where your    surgery  will be performed.  7.  Thoroughly rinse your body with warm water from the neck down.  8.  DO NOT shower/wash with your normal soap after using and rinsing off the CHG Soap.                9.  Pat yourself dry with a clean towel.            10.  Wear clean pajamas.            11.  Place clean sheets on your bed the night of your first shower and do not  sleep with pets. Day of Surgery : Do not apply any lotions/deodorants the morning of surgery.  Please wear clean clothes to the hospital/surgery center.  FAILURE TO FOLLOW THESE INSTRUCTIONS MAY RESULT IN THE CANCELLATION OF YOUR SURGERY  PATIENT SIGNATURE_________________________________  NURSE SIGNATURE__________________________________  ________________________________________________________________________

## 2022-08-19 ENCOUNTER — Telehealth: Payer: Self-pay | Admitting: Podiatry

## 2022-08-19 NOTE — Progress Notes (Signed)
Surgical orders requested via Epic inbox.

## 2022-08-19 NOTE — Telephone Encounter (Signed)
DOS: 08/21/2022  [employer]  Amputation Toe Interphalangeal Hallux Lt ([id number])  DX: [id number]  Authorization #: [id number] Authorization Valid: 08/21/2022 - 11/19/2022

## 2022-08-20 ENCOUNTER — Encounter (HOSPITAL_COMMUNITY): Payer: Self-pay

## 2022-08-20 ENCOUNTER — Other Ambulatory Visit: Payer: Self-pay

## 2022-08-20 ENCOUNTER — Encounter (HOSPITAL_COMMUNITY)
Admission: RE | Admit: 2022-08-20 | Discharge: 2022-08-20 | Disposition: A | Discharge status: Home / Self Care | Payer: PPO | Source: Ambulatory Visit | Attending: Podiatry | Admitting: Podiatry

## 2022-08-20 VITALS — BP 113/88 | HR 73 | Temp 97.8°F | Resp 20 | Ht 75.0 in | Wt 197.0 lb

## 2022-08-20 DIAGNOSIS — E119 Type 2 diabetes mellitus without complications: Secondary | ICD-10-CM | POA: Diagnosis not present

## 2022-08-20 DIAGNOSIS — Z794 Long term (current) use of insulin: Secondary | ICD-10-CM | POA: Diagnosis not present

## 2022-08-20 DIAGNOSIS — Z01812 Encounter for preprocedural laboratory examination: Secondary | ICD-10-CM | POA: Diagnosis not present

## 2022-08-20 DIAGNOSIS — I2510 Atherosclerotic heart disease of native coronary artery without angina pectoris: Secondary | ICD-10-CM

## 2022-08-20 HISTORY — DX: Anxiety disorder, unspecified: F41.9

## 2022-08-20 HISTORY — DX: Unspecified osteoarthritis, unspecified site: M19.90

## 2022-08-20 LAB — BASIC METABOLIC PANEL
Anion gap: 8 (ref 5–15)
BUN: 21 mg/dL (ref 8–23)
CO2: 26 mmol/L (ref 22–32)
Calcium: 9.7 mg/dL (ref 8.9–10.3)
Chloride: 105 mmol/L (ref 98–111)
Creatinine, Ser: 1.13 mg/dL (ref 0.61–1.24)
GFR, Estimated: >60 mL/min (ref >60)
Glucose, Bld: 286 mg/dL — ABNORMAL HIGH (ref 70–99)
Potassium: 4.7 mmol/L (ref 3.5–5.1)
Sodium: 139 mmol/L (ref 135–145)

## 2022-08-20 LAB — GLUCOSE, CAPILLARY: Glucose-Capillary: 307 mg/dL — ABNORMAL HIGH (ref 70–99)

## 2022-08-21 ENCOUNTER — Ambulatory Visit (HOSPITAL_COMMUNITY): Payer: PPO | Admitting: Certified Registered"

## 2022-08-21 ENCOUNTER — Encounter: Payer: Self-pay | Admitting: Podiatry

## 2022-08-21 ENCOUNTER — Ambulatory Visit (HOSPITAL_COMMUNITY)
Admission: RE | Admit: 2022-08-21 | Discharge: 2022-08-21 | Disposition: A | Discharge status: Home / Self Care | Payer: PPO | Attending: Podiatry | Admitting: Podiatry

## 2022-08-21 ENCOUNTER — Ambulatory Visit (HOSPITAL_BASED_OUTPATIENT_CLINIC_OR_DEPARTMENT_OTHER): Payer: PPO | Admitting: Certified Registered"

## 2022-08-21 ENCOUNTER — Other Ambulatory Visit: Payer: Self-pay

## 2022-08-21 ENCOUNTER — Other Ambulatory Visit (HOSPITAL_COMMUNITY): Payer: Self-pay

## 2022-08-21 ENCOUNTER — Ambulatory Visit (HOSPITAL_COMMUNITY): Payer: PPO

## 2022-08-21 ENCOUNTER — Encounter (HOSPITAL_COMMUNITY): Payer: Self-pay | Admitting: Podiatry

## 2022-08-21 ENCOUNTER — Encounter (HOSPITAL_COMMUNITY)
Admission: RE | Disposition: A | Discharge status: Home / Self Care | Payer: Self-pay | Source: Home / Self Care | Attending: Podiatry

## 2022-08-21 DIAGNOSIS — I2510 Atherosclerotic heart disease of native coronary artery without angina pectoris: Secondary | ICD-10-CM | POA: Insufficient documentation

## 2022-08-21 DIAGNOSIS — Z87891 Personal history of nicotine dependence: Secondary | ICD-10-CM

## 2022-08-21 DIAGNOSIS — I252 Old myocardial infarction: Secondary | ICD-10-CM | POA: Diagnosis not present

## 2022-08-21 DIAGNOSIS — E119 Type 2 diabetes mellitus without complications: Secondary | ICD-10-CM

## 2022-08-21 DIAGNOSIS — M86172 Other acute osteomyelitis, left ankle and foot: Secondary | ICD-10-CM | POA: Diagnosis not present

## 2022-08-21 DIAGNOSIS — Z89412 Acquired absence of left great toe: Secondary | ICD-10-CM | POA: Diagnosis not present

## 2022-08-21 DIAGNOSIS — I10 Essential (primary) hypertension: Secondary | ICD-10-CM | POA: Insufficient documentation

## 2022-08-21 DIAGNOSIS — E1151 Type 2 diabetes mellitus with diabetic peripheral angiopathy without gangrene: Secondary | ICD-10-CM | POA: Diagnosis not present

## 2022-08-21 DIAGNOSIS — E1165 Type 2 diabetes mellitus with hyperglycemia: Secondary | ICD-10-CM | POA: Diagnosis not present

## 2022-08-21 DIAGNOSIS — E1152 Type 2 diabetes mellitus with diabetic peripheral angiopathy with gangrene: Secondary | ICD-10-CM | POA: Diagnosis not present

## 2022-08-21 DIAGNOSIS — M869 Osteomyelitis, unspecified: Secondary | ICD-10-CM

## 2022-08-21 DIAGNOSIS — Z794 Long term (current) use of insulin: Secondary | ICD-10-CM | POA: Diagnosis not present

## 2022-08-21 DIAGNOSIS — I96 Gangrene, not elsewhere classified: Secondary | ICD-10-CM | POA: Diagnosis not present

## 2022-08-21 DIAGNOSIS — M86672 Other chronic osteomyelitis, left ankle and foot: Secondary | ICD-10-CM | POA: Diagnosis not present

## 2022-08-21 DIAGNOSIS — E1169 Type 2 diabetes mellitus with other specified complication: Secondary | ICD-10-CM | POA: Diagnosis not present

## 2022-08-21 DIAGNOSIS — S98112A Complete traumatic amputation of left great toe, initial encounter: Secondary | ICD-10-CM | POA: Diagnosis not present

## 2022-08-21 DIAGNOSIS — L97529 Non-pressure chronic ulcer of other part of left foot with unspecified severity: Secondary | ICD-10-CM | POA: Diagnosis not present

## 2022-08-21 HISTORY — PX: AMPUTATION TOE: SHX6595

## 2022-08-21 LAB — GLUCOSE, CAPILLARY
Glucose-Capillary: 186 mg/dL — ABNORMAL HIGH (ref 70–99)
Glucose-Capillary: 195 mg/dL — ABNORMAL HIGH (ref 70–99)

## 2022-08-21 LAB — HEMOGLOBIN A1C
Hgb A1c MFr Bld: 9.1 % — ABNORMAL HIGH (ref 4.8–5.6)
Mean Plasma Glucose: 214 mg/dL

## 2022-08-21 SURGERY — AMPUTATION, TOE
Anesthesia: Monitor Anesthesia Care | Site: Toe | Laterality: Left

## 2022-08-21 MED ORDER — ONDANSETRON HCL 4 MG/2ML IJ SOLN
INTRAMUSCULAR | Status: DC | PRN
  Administered 2022-08-21: 4 mg via INTRAVENOUS

## 2022-08-21 MED ORDER — MIDAZOLAM HCL 2 MG/2ML IJ SOLN
INTRAMUSCULAR | Status: AC
  Filled 2022-08-21: qty 2

## 2022-08-21 MED ORDER — PHENYLEPHRINE 80 MCG/ML (10ML) SYRINGE FOR IV PUSH (FOR BLOOD PRESSURE SUPPORT)
PREFILLED_SYRINGE | INTRAVENOUS | Status: DC | PRN
  Administered 2022-08-21: 80 ug via INTRAVENOUS

## 2022-08-21 MED ORDER — FENTANYL CITRATE PF 50 MCG/ML IJ SOSY
25.0000 ug | PREFILLED_SYRINGE | INTRAMUSCULAR | Status: DC | PRN
  Administered 2022-08-21: 50 ug via INTRAVENOUS

## 2022-08-21 MED ORDER — DEXAMETHASONE SODIUM PHOSPHATE 10 MG/ML IJ SOLN
INTRAMUSCULAR | Status: AC
  Filled 2022-08-21: qty 1

## 2022-08-21 MED ORDER — ORAL CARE MOUTH RINSE: 15.0000 mL | Freq: Once | OROMUCOSAL | Status: AC

## 2022-08-21 MED ORDER — CHLORHEXIDINE GLUCONATE 0.12 % MT SOLN
15.0000 mL | Freq: Once | OROMUCOSAL | Status: AC
  Administered 2022-08-21: 15 mL via OROMUCOSAL

## 2022-08-21 MED ORDER — LIDOCAINE HCL (PF) 2 % IJ SOLN
INTRAMUSCULAR | Status: DC | PRN
  Administered 2022-08-21: 300 mg

## 2022-08-21 MED ORDER — FENTANYL CITRATE (PF) 100 MCG/2ML IJ SOLN
INTRAMUSCULAR | Status: DC | PRN
  Administered 2022-08-21: 50 ug via INTRAVENOUS

## 2022-08-21 MED ORDER — FENTANYL CITRATE (PF) 100 MCG/2ML IJ SOLN
INTRAMUSCULAR | Status: AC
  Filled 2022-08-21: qty 2

## 2022-08-21 MED ORDER — OXYCODONE HCL 5 MG/5ML PO SOLN: 5.0000 mg | Freq: Once | ORAL | Status: AC | PRN

## 2022-08-21 MED ORDER — ONDANSETRON HCL 4 MG/2ML IJ SOLN: 4.0000 mg | Freq: Once | INTRAMUSCULAR | Status: DC | PRN

## 2022-08-21 MED ORDER — CHLORHEXIDINE GLUCONATE CLOTH 2 % EX PADS: 6.0000 | MEDICATED_PAD | Freq: Once | CUTANEOUS | Status: DC

## 2022-08-21 MED ORDER — PROPOFOL 10 MG/ML IV BOLUS
INTRAVENOUS | Status: AC
  Filled 2022-08-21: qty 20

## 2022-08-21 MED ORDER — HYDROCODONE-ACETAMINOPHEN 5-325 MG PO TABS
1.0000 | ORAL_TABLET | Freq: Four times a day (QID) | ORAL | 0 refills | Status: DC | PRN
  Filled 2022-08-21: qty 15, 2d supply, fill #0

## 2022-08-21 MED ORDER — PROPOFOL 10 MG/ML IV BOLUS
INTRAVENOUS | Status: DC | PRN
  Administered 2022-08-21: 20 mg via INTRAVENOUS

## 2022-08-21 MED ORDER — FENTANYL CITRATE PF 50 MCG/ML IJ SOSY
PREFILLED_SYRINGE | INTRAMUSCULAR | Status: AC
  Administered 2022-08-21: 50 ug via INTRAVENOUS
  Filled 2022-08-21: qty 2

## 2022-08-21 MED ORDER — BUPIVACAINE HCL (PF) 0.25 % IJ SOLN
INTRAMUSCULAR | Status: AC
  Filled 2022-08-21: qty 30

## 2022-08-21 MED ORDER — PROMETHAZINE HCL 25 MG PO TABS
25.0000 mg | ORAL_TABLET | Freq: Three times a day (TID) | ORAL | 0 refills | Status: DC | PRN
  Filled 2022-08-21: qty 20, 7d supply, fill #0

## 2022-08-21 MED ORDER — PROPOFOL 500 MG/50ML IV EMUL
INTRAVENOUS | Status: DC | PRN
  Administered 2022-08-21: 50 ug/kg/min via INTRAVENOUS

## 2022-08-21 MED ORDER — CEFAZOLIN SODIUM-DEXTROSE 2-4 GM/100ML-% IV SOLN
2.0000 g | INTRAVENOUS | Status: AC
  Administered 2022-08-21: 2 g via INTRAVENOUS
  Filled 2022-08-21: qty 100

## 2022-08-21 MED ORDER — LIDOCAINE HCL (PF) 1 % IJ SOLN
INTRAMUSCULAR | Status: AC
  Filled 2022-08-21: qty 30

## 2022-08-21 MED ORDER — 0.9 % SODIUM CHLORIDE (POUR BTL) OPTIME
TOPICAL | Status: DC | PRN
  Administered 2022-08-21: 1000 mL

## 2022-08-21 MED ORDER — ONDANSETRON HCL 4 MG/2ML IJ SOLN
INTRAMUSCULAR | Status: AC
  Filled 2022-08-21: qty 2

## 2022-08-21 MED ORDER — OXYCODONE HCL 5 MG PO TABS
ORAL_TABLET | ORAL | Status: AC
  Administered 2022-08-21: 5 mg via ORAL
  Filled 2022-08-21: qty 1

## 2022-08-21 MED ORDER — OXYCODONE HCL 5 MG PO TABS: 5.0000 mg | ORAL_TABLET | Freq: Once | ORAL | Status: AC | PRN

## 2022-08-21 MED ORDER — LACTATED RINGERS IV SOLN: INTRAVENOUS | Status: DC

## 2022-08-21 SURGICAL SUPPLY — 37 items
APL PRP STRL LF DISP 70% ISPRP (MISCELLANEOUS) ×1
BAG COUNTER SPONGE SURGICOUNT (BAG) IMPLANT
BAG SPNG CNTER NS LX DISP (BAG)
BLADE HEX COATED 2.75 (ELECTRODE) ×2 IMPLANT
BLADE OSCILLATING/SAGITTAL (BLADE) ×1
BLADE SW THK.38XMED LNG THN (BLADE) ×2 IMPLANT
BNDG CMPR 75X21 PLY HI ABS (MISCELLANEOUS) ×1
BNDG CMPR 9X4 STRL LF SNTH (GAUZE/BANDAGES/DRESSINGS) ×1
BNDG ELASTIC 3X5.8 VLCR STR LF (GAUZE/BANDAGES/DRESSINGS) ×2 IMPLANT
BNDG ELASTIC 4X5.8 VLCR STR LF (GAUZE/BANDAGES/DRESSINGS) IMPLANT
BNDG ESMARK 4X9 LF (GAUZE/BANDAGES/DRESSINGS) ×2 IMPLANT
BNDG GAUZE DERMACEA FLUFF 4 (GAUZE/BANDAGES/DRESSINGS) ×2 IMPLANT
BNDG GZE DERMACEA 4 6PLY (GAUZE/BANDAGES/DRESSINGS) ×1
CHLORAPREP W/TINT 26 (MISCELLANEOUS) ×2 IMPLANT
ELECT REM PT RETURN 15FT ADLT (MISCELLANEOUS) ×2 IMPLANT
GAUZE SPONGE 4X4 12PLY STRL (GAUZE/BANDAGES/DRESSINGS) ×2 IMPLANT
GAUZE STRETCH 2X75IN STRL (MISCELLANEOUS) ×2 IMPLANT
GAUZE XEROFORM 1X8 LF (GAUZE/BANDAGES/DRESSINGS) IMPLANT
GLOVE BIO SURGEON STRL SZ7.5 (GLOVE) ×4 IMPLANT
GLOVE BIOGEL PI IND STRL 7.5 (GLOVE) ×2 IMPLANT
GOWN STRL REUS W/ TWL LRG LVL3 (GOWN DISPOSABLE) ×2 IMPLANT
GOWN STRL REUS W/ TWL XL LVL3 (GOWN DISPOSABLE) ×2 IMPLANT
GOWN STRL REUS W/TWL LRG LVL3 (GOWN DISPOSABLE) ×1
GOWN STRL REUS W/TWL XL LVL3 (GOWN DISPOSABLE) ×1
KIT BASIN OR (CUSTOM PROCEDURE TRAY) ×2 IMPLANT
KIT TURNOVER KIT A (KITS) IMPLANT
NDL HYPO 25X1 1.5 SAFETY (NEEDLE) ×2 IMPLANT
NDL SAFETY ECLIP 18X1.5 (MISCELLANEOUS) IMPLANT
NEEDLE HYPO 25X1 1.5 SAFETY (NEEDLE) ×1 IMPLANT
NS IRRIG 1000ML POUR BTL (IV SOLUTION) IMPLANT
PACK ORTHO EXTREMITY (CUSTOM PROCEDURE TRAY) IMPLANT
PENCIL SMOKE EVACUATOR (MISCELLANEOUS) IMPLANT
SUT ETHILON 3 0 PS 1 (SUTURE) IMPLANT
SUT MNCRL AB 3-0 PS2 18 (SUTURE) ×2 IMPLANT
SUT PROLENE 2 0 SH DA (SUTURE) IMPLANT
SYR 20ML LL LF (SYRINGE) ×2 IMPLANT
UNDERPAD 30X36 HEAVY ABSORB (UNDERPADS AND DIAPERS) ×2 IMPLANT

## 2022-08-21 NOTE — H&P (Signed)
  08/21/2022 12:59 PM  Justin Key  has presented today for surgery, with the diagnosis of gangrene.  The various methods of treatment have been discussed with the patient and family. After consideration of risks, benefits and other options for treatment, the patient has consented to  Procedure(s): AMPUTATION TOE INTERPHALANGEAL JOINT BIG TOE (Left) as a surgical intervention.  The patient's history has been reviewed, patient examined, no change in status, stable for surgery.  I have reviewed the patient's chart and labs.  Questions were answered to the patient's satisfaction.     Trula Slade

## 2022-08-21 NOTE — Brief Op Note (Signed)
08/21/2022  1:49 PM  PATIENT:  Justin Key  71 y.o. male  PRE-OPERATIVE DIAGNOSIS:  gangrene, osteomyelitis  POST-OPERATIVE DIAGNOSIS:  gangrene, osteomyelitis  PROCEDURE:  Procedure(s): AMPUTATION TOE INTERPHALANGEAL JOINT BIG TOE (Left)  SURGEON:  Surgeon(s) and Role:    * Trula Slade, DPM - Primary  PHYSICIAN ASSISTANT:   ASSISTANTS: none   ANESTHESIA:   regional and MAC  EBL:  minimal   BLOOD ADMINISTERED:none  DRAINS: none   LOCAL MEDICATIONS USED:  NONE  SPECIMEN:  Source of Specimen:  toe for pathology  DISPOSITION OF SPECIMEN:  PATHOLOGY  COUNTS:  YES  TOURNIQUET:  * No tourniquets in log *  DICTATION: .Dragon Dictation  PLAN OF CARE: Discharge to home after PACU  PATIENT DISPOSITION:  PACU - hemodynamically stable.   Delay start of Pharmacological VTE agent (>24hrs) due to surgical blood loss or risk of bleeding: no

## 2022-08-21 NOTE — Anesthesia Postprocedure Evaluation (Signed)
Anesthesia Post Note  Patient: Justin Key  Procedure(s) Performed: AMPUTATION TOE INTERPHALANGEAL JOINT BIG TOE (Left: Toe)     Patient location during evaluation: PACU Anesthesia Type: MAC and Regional Level of consciousness: awake and alert Pain management: pain level controlled Vital Signs Assessment: post-procedure vital signs reviewed and stable Respiratory status: spontaneous breathing, nonlabored ventilation and respiratory function stable Cardiovascular status: blood pressure returned to baseline and stable Postop Assessment: no apparent nausea or vomiting Anesthetic complications: no   No notable events documented.  Last Vitals:  Vitals:   08/21/22 1352 08/21/22 1400  BP: (!) 154/85 (!) 147/90  Pulse: 69 69  Resp: 12 13  Temp: 36.6 C   SpO2: 100% 100%    Last Pain:  Vitals:   08/21/22 1013  TempSrc: Oral  PainSc: [place]

## 2022-08-21 NOTE — Anesthesia Preprocedure Evaluation (Signed)
Anesthesia Evaluation  Patient identified by MRN, date of birth, ID band Patient awake    Reviewed: Allergy & Precautions, H&P , NPO status , Patient's Chart, lab work & pertinent test results  Airway Mallampati: II  TM Distance: >3 FB Neck ROM: Full    Dental no notable dental hx.    Pulmonary neg pulmonary ROS, former smoker   Pulmonary exam normal breath sounds clear to auscultation       Cardiovascular hypertension, + CAD, + Past MI and + Peripheral Vascular Disease  Normal cardiovascular exam Rhythm:Regular Rate:Normal     Neuro/Psych negative neurological ROS  negative psych ROS   GI/Hepatic negative GI ROS, Neg liver ROS,,,  Endo/Other  diabetes, Poorly Controlled, Type 2, Insulin Dependent    Renal/GU negative Renal ROS  negative genitourinary   Musculoskeletal negative musculoskeletal ROS (+)    Abdominal   Peds negative pediatric ROS (+)  Hematology negative hematology ROS (+)   Anesthesia Other Findings   Reproductive/Obstetrics negative OB ROS                             Anesthesia Physical Anesthesia Plan  ASA: 3  Anesthesia Plan: MAC   Post-op Pain Management: Regional block*   Induction: Intravenous  PONV Risk Score and Plan: 1 and Propofol infusion and Treatment may vary due to age or medical condition  Airway Management Planned: Simple Face Mask  Additional Equipment:   Intra-op Plan:   Post-operative Plan:   Informed Consent: I have reviewed the patients History and Physical, chart, labs and discussed the procedure including the risks, benefits and alternatives for the proposed anesthesia with the patient or authorized representative who has indicated his/her understanding and acceptance.     Dental advisory given  Plan Discussed with: CRNA and Surgeon  Anesthesia Plan Comments:        Anesthesia Quick Evaluation

## 2022-08-21 NOTE — Discharge Instructions (Signed)
See written instruction Resume all home medications as prior Continue antibiotics Start pain medication (Vicodin) and nausea medication (phenergan) as needed.  Wear surgical shoe and stay off of the foot to all the incision to heal.

## 2022-08-21 NOTE — Transfer of Care (Signed)
Immediate Anesthesia Transfer of Care Note  Patient: Justin Key  Procedure(s) Performed: AMPUTATION TOE INTERPHALANGEAL JOINT BIG TOE (Left: Toe)  Patient Location: PACU  Anesthesia Type:MAC combined with regional for post-op pain  Level of Consciousness: awake and patient cooperative  Airway & Oxygen Therapy: Patient Spontanous Breathing and Patient connected to face mask oxygen  Post-op Assessment: Report given to RN and Post -op Vital signs reviewed and stable  Post vital signs: Reviewed and stable  Last Vitals:  Vitals Value Taken Time  BP 154/85 08/21/22 1352  Temp    Pulse 68 08/21/22 1356  Resp 12 08/21/22 1355  SpO2 100 % 08/21/22 1356  Vitals shown include unvalidated device data.  Last Pain:  Vitals:   08/21/22 1013  TempSrc: Oral  PainSc: 6       Patients Stated Pain Goal: 4 (59/74/16 [id number])  Complications: No notable events documented.

## 2022-08-21 NOTE — Anesthesia Procedure Notes (Signed)
Procedure Name: MAC Date/Time: 08/21/2022 1:06 PM  Performed by: West Pugh, CRNAPre-anesthesia Checklist: Patient identified, Emergency Drugs available, Suction available, Patient being monitored and Timeout performed Patient Re-evaluated:Patient Re-evaluated prior to induction Oxygen Delivery Method: Simple face mask Preoxygenation: Pre-oxygenation with 100% oxygen Induction Type: IV induction Placement Confirmation: positive ETCO2 Dental Injury: Teeth and Oropharynx as per pre-operative assessment

## 2022-08-21 NOTE — Anesthesia Procedure Notes (Signed)
Anesthesia Regional Block: Ankle block   Pre-Anesthetic Checklist: , timeout performed,  Correct Patient, Correct Site, Correct Laterality,  Correct Procedure, Correct Position, site marked,  Risks and benefits discussed,  Surgical consent,  Pre-op evaluation,  At surgeon's request and post-op pain management  Laterality: Left  Prep: chloraprep       Needles:  Injection technique: Single-shot  Needle Type: Other   (25G needle)        Additional Needles:   Narrative:  Start time: 08/21/2022 11:25 AM End time: 08/21/2022 11:30 AM Injection made incrementally with aspirations every 5 mL.  Performed by: Personally  Anesthesiologist: Myrtie Soman, MD  Additional Notes: Patient tolerated the procedure well without complications

## 2022-08-22 ENCOUNTER — Encounter (HOSPITAL_COMMUNITY): Payer: Self-pay | Admitting: Podiatry

## 2022-08-22 NOTE — Progress Notes (Signed)
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  He states he is ready to proceed with amputation of toe but like to only have part of the toe amputated.  He has not seen any drainage or pus.  Denies any fevers or chills at this time.  Presents today with his wife.  No other concerns.    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  He underwent bypass on June 23, 2022.    Objective: AAO x3, NAD DP/PT pulses palpable bilaterally, CRT less than 3 seconds Gangrenous changes present to the distal aspect left hallux incision dry.  No drainage or pus.  There is edema to the foot since the bypass surgery there is no erythema or warmth associated with this.  There is no fluctuation or crepitation but there is no malodor. No pain with calf compression, swelling, warmth, erythema.   Assessment: Gangrene, cellulitis left hallux  Plan: -All treatment options discussed with the patient including all alternatives, risks, complications.  -X-rays were obtained and reviewed.  3 views were obtained.  No soft tissue edema is present.  There is cortical destruction of the distal aspect of the distal phalanx of the hallux consistent with osteomyelitis. -At this time the patient wants to proceed with amputation of the toe.  He wants to do partial toe amputation.  Discussed with him we will likely do this however if it seems to be infection spreading or there is any further necrosis wanting to proceed with total amputation he understands this.  Will plan on doing this later this week. -The incision placement as well as the postoperative course was discussed with the patient. I discussed  risks of the surgery which include, but not limited to, infection, bleeding, pain, swelling, need for further surgery, delayed or nonhealing, painful or ugly scar, numbness or sensation changes, recurrence, transfer lesions, further deformity, DVT/PE, loss of toe/foot. Patient understands these risks and wishes to proceed with surgery. The surgical consent was reviewed with the patient all 3 pages were signed. No promises or guarantees were given to the outcome of the procedure. All questions were answered to the best of my ability. Before the surgery the patient was encouraged to call the office if there is any further questions. The surgery will be performed at [facility] on outpatient basis. -Augmentin  Trula Slade DPM

## 2022-08-24 LAB — SURGICAL PATHOLOGY

## 2022-08-26 NOTE — Op Note (Signed)
  08/21/2022   PATIENT:  Justin Key  71 y.o. male   PRE-OPERATIVE DIAGNOSIS:  gangrene, osteomyelitis   POST-OPERATIVE DIAGNOSIS:  gangrene, osteomyelitis   PROCEDURE:  Procedure(s): AMPUTATION TOE INTERPHALANGEAL JOINT BIG TOE (Left)   SURGEON:  Surgeon(s) and Role:    * Trula Slade, DPM - Primary   PHYSICIAN ASSISTANT:    ASSISTANTS: none    ANESTHESIA:   regional and MAC   EBL:  minimal    BLOOD ADMINISTERED:none   DRAINS: none    LOCAL MEDICATIONS USED:  NONE   SPECIMEN:  Source of Specimen:  toe for pathology   DISPOSITION OF SPECIMEN:  PATHOLOGY   COUNTS:  YES   TOURNIQUET:  * No tourniquets in log *   DICTATION: .Dragon Dictation   PLAN OF CARE: Discharge to home after PACU   PATIENT DISPOSITION:  PACU - hemodynamically stable.   Delay start of Pharmacological VTE agent (>24hrs) due to surgical blood loss or risk of bleeding: no    Indications for surgery: 71 year old male presented to the office with ongoing gangrenous changes, wound to the distal aspect of the left hallux.  Patient underwent vascular invention.  Given osteomyelitis as well as the wound he agrees to proceed with amputation of the toe.  Alternatives risks and complications were discussed.  No promises or guarantees were given second the procedure and all questions were answered the best my ability.  Procedure in detail: The patient's with verbally and visually identified by myself and nursing staff and the anesthesia staff preoperatively.  He was then  transferred to the operating room via stretcher.  Left lower extremity was scrubbed, prepped, draped in normal sterile fashion.  Timeout was performed.  A modified racquet shaped incision was made along the left hallux along the IPJ.  Incision was made with a 15 blade scalpel from skin to bone subcutaneously around the DIPJ.  The toe was disarticulated at the level of the DIPJ.  The toe was sent to pathology.  The remaining bone  appeared to be viable.  Is hard in nature, white in color.  No purulence or proximal tracking noted.  I copiously irrigated the wound with saline hemostasis was achieved.  Incision was then closed with nylon.  Nonadherent dressings applied followed by dry sterile dressing.  He was woken from anesthesia and found to tolerate the procedure without any complications.  Transferred to PACU vital signs stable and vascular status intact.

## 2022-08-27 ENCOUNTER — Encounter (HOSPITAL_COMMUNITY): Payer: HMO

## 2022-08-27 ENCOUNTER — Ambulatory Visit (INDEPENDENT_AMBULATORY_CARE_PROVIDER_SITE_OTHER): Payer: PPO | Admitting: Podiatry

## 2022-08-27 ENCOUNTER — Ambulatory Visit (INDEPENDENT_AMBULATORY_CARE_PROVIDER_SITE_OTHER): Payer: PPO

## 2022-08-27 ENCOUNTER — Other Ambulatory Visit (HOSPITAL_COMMUNITY): Payer: Self-pay

## 2022-08-27 ENCOUNTER — Other Ambulatory Visit (HOSPITAL_COMMUNITY): Payer: HMO

## 2022-08-27 ENCOUNTER — Ambulatory Visit: Payer: HMO

## 2022-08-27 VITALS — BP 139/68 | HR 76

## 2022-08-27 DIAGNOSIS — M86172 Other acute osteomyelitis, left ankle and foot: Secondary | ICD-10-CM | POA: Diagnosis not present

## 2022-08-29 NOTE — Progress Notes (Signed)
Subjective: Chief Complaint  Patient presents with   Routine Post Op    POV #1 DOS 08/21/2022 LT BIG TOE PARTIAL VS TOTAL TOE AMPUTATION    Justin Key is a 71 y.o. is seen today in office s/p left partial hallux amputation preformed on 08/21/2022.  He has not had any significant pain.  He has remained in the offloading shoe.  Denies any fevers or chills.  No other concerns.    Objective: General: No acute distress, AAOx3  Foot appears to be well-perfused. Sensation decreased Left foot: Incision is well coapted without any evidence of dehiscence. There is no surrounding erythema, ascending cellulitis, fluctuance, crepitus, malodor, drainage/purulence. There is minimal edema around the surgical site. There is no significant pain along the surgical site.  No pain with calf compression, swelling, warmth, erythema.   Assessment and Plan:  Status post left partial hallux amputation, doing well with no complications   -Treatment options discussed including all alternatives, risks, and complications -X-rays were obtained reviewed.  3 views of the foot were obtained.  Status post partial hallux amputation.  No evidence of acute fracture, osteomyelitis. -Antibiotic ointment and dressing applied.  Keep the dressing clean, dry, intact.  If needed they can change the bandage with a similar dressing.  Hold off on getting the incision wet currently. -Ice/elevation -Pain medication as needed. -Monitor for any clinical signs or symptoms of infection and DVT/PE and directed to call the office immediately should any occur or go to the ER. -Follow-up as scheduled for possible suture removal or sooner if any problems arise. In the meantime, encouraged to call the office with any questions, concerns, change in symptoms.   Celesta Gentile, DPM

## 2022-08-31 ENCOUNTER — Other Ambulatory Visit (HOSPITAL_COMMUNITY): Payer: Self-pay

## 2022-09-01 ENCOUNTER — Other Ambulatory Visit (HOSPITAL_COMMUNITY): Payer: Self-pay

## 2022-09-01 MED ORDER — SERTRALINE HCL 50 MG PO TABS
50.0000 mg | ORAL_TABLET | Freq: Every day | ORAL | 0 refills | Status: DC
  Filled 2022-09-01: qty 90, 90d supply, fill #0

## 2022-09-03 ENCOUNTER — Ambulatory Visit (INDEPENDENT_AMBULATORY_CARE_PROVIDER_SITE_OTHER): Payer: PPO

## 2022-09-03 DIAGNOSIS — M86172 Other acute osteomyelitis, left ankle and foot: Secondary | ICD-10-CM

## 2022-09-03 NOTE — Progress Notes (Signed)
Patient presents today for post op visit # 2, patient of Dr. Jacqualyn Posey.   POV #2 DOS 08/21/2022 LT BIG TOE PARTIAL VS TOTAL TOE AMPUTATION   He presents in his surgical shoe. Denies any falls or injury to the foot. Foot is slightly swollen. No signs of infection. No calf pain or shortness of breath. Bandages dry and intact. Incision is intact. Some sutures were removed today without complication, patient tolerated it well. The remaining sutures will be removed at the next visit.      Dr. Jacqualyn Posey did take a look at his foot today as well.   Foot redressed today and placed him back in his surgical shoe. Reviewed icing and elevation. He will follow up with Dr. Jacqualyn Posey next week for POV# 3.

## 2022-09-07 ENCOUNTER — Ambulatory Visit
Admission: EM | Admit: 2022-09-07 | Discharge: 2022-09-07 | Disposition: A | Discharge status: Home / Self Care | Payer: PPO | Attending: Urgent Care | Admitting: Urgent Care

## 2022-09-07 ENCOUNTER — Other Ambulatory Visit (HOSPITAL_COMMUNITY): Payer: Self-pay

## 2022-09-07 ENCOUNTER — Ambulatory Visit (INDEPENDENT_AMBULATORY_CARE_PROVIDER_SITE_OTHER): Payer: PPO

## 2022-09-07 DIAGNOSIS — K5900 Constipation, unspecified: Secondary | ICD-10-CM

## 2022-09-07 DIAGNOSIS — R109 Unspecified abdominal pain: Secondary | ICD-10-CM | POA: Diagnosis not present

## 2022-09-07 MED ORDER — DOCUSATE SODIUM 100 MG PO CAPS
100.0000 mg | ORAL_CAPSULE | Freq: Two times a day (BID) | ORAL | 0 refills | Status: AC
  Filled 2022-09-07: qty 200, 100d supply, fill #0

## 2022-09-07 MED ORDER — POLYETHYLENE GLYCOL 3350 17 GM/SCOOP PO POWD
1.0000 | Freq: Every day | ORAL | 0 refills | Status: DC | PRN
  Filled 2022-09-07: qty 714, 2d supply, fill #0

## 2022-09-07 MED ORDER — FLEET ENEMA 7-19 GM/118ML RE ENEM
1.0000 | ENEMA | Freq: Every day | RECTAL | 0 refills | Status: DC | PRN
  Filled 2022-09-07: qty 233, 1d supply, fill #0

## 2022-09-07 NOTE — ED Triage Notes (Signed)
Pt reports no bowel movement since last Thursday. Pt reports abdominal pain and vomiting x 3 days.

## 2022-09-07 NOTE — ED Provider Notes (Signed)
[facility]  Note:  This document was prepared using Systems analyst and may include unintentional dictation errors.  MRN: [id number] DOB: [birth year]  Subjective:   Justin Key is a 71 y.o. male presenting for acute on chronic constipation.  Patient has had significant lower abdominal right-sided pain.  Has been able to pass gas but is getting worse with his belly fullness.  Reports that he has been nauseated and had vomiting for the past 3 days now.  Last complete bowel movement was 5 days ago.  Patient has had chronic constipation for the past few years.  This started from chronic use of opioids.  Reports that he is no longer using this.  No recent surgeries.  No bloody stools.  No current facility-administered medications for this encounter.  Current Outpatient Medications:    amoxicillin-clavulanate (AUGMENTIN) 875-125 MG tablet, Take 1 tablet by mouth 2 (two) times daily., Disp: 20 tablet, Rfl: 0   aspirin EC 81 MG tablet, Take 1 tablet (81 mg total) by mouth daily., Disp: 90 tablet, Rfl: 3   carboxymethylcellulose (REFRESH PLUS) 0.5 % SOLN, Place 1 drop into both eyes 3 (three) times daily as needed (dry eyes)., Disp: , Rfl:    empagliflozin (JARDIANCE) 10 MG TABS tablet, Take 1 tablet (10 mg total) by mouth daily. (Patient not taking: Reported on 08/19/2022), Disp: 90 tablet, Rfl: 4   glimepiride (AMARYL) 4 MG tablet, TAKE 2 TABLETS BY MOUTH ONCE A DAY, Disp: 180 tablet, Rfl: 1   glucose blood (FREESTYLE LITE) test strip, Use to check blood glucose 2 times daily, Disp: 200 strip, Rfl: 4   glucose blood test strip, use 1 strip to check blood glucose 2 (two) times daily., Disp: 200 each, Rfl: 3   HYDROcodone-acetaminophen (NORCO/VICODIN) 5-325 MG tablet, Take 1-2 tablets by mouth every 6 (six) hours as needed., Disp: 15 tablet, Rfl: 0   ibuprofen (ADVIL,MOTRIN) 200 MG tablet, Take 600-800 mg by mouth every 8 (eight) hours as needed for  moderate pain. , Disp: , Rfl:    insulin degludec (TRESIBA FLEXTOUCH) 100 UNIT/ML FlexTouch Pen, Inject 8 Units into the skin daily in the evening, Disp: 3 mL, Rfl: 5   Insulin Pen Needle (BD PEN NEEDLE NANO 2ND GEN) 32G X 4 MM MISC, Use as directed once a day, Disp: 100 each, Rfl: 6   Insulin Pen Needle (TECHLITE PEN NEEDLES) 32G X 4 MM MISC, Use daily as directed, Disp: 50 each, Rfl: 6   lidocaine (LIDODERM) 5 %, Apply 2 patches to affected area daily for up to 12 hours.  Remove for 12 hours & repeat as directed. (Patient taking differently: Place 1 patch onto the skin daily as needed (pain).), Disp: 60 patch, Rfl: 11   metFORMIN (GLUCOPHAGE-XR) 500 MG 24 hr tablet, TAKE 1 TABLET BY MOUTH TWO TIMES DAILY, Disp: 60 tablet, Rfl: 6   metoCLOPramide (REGLAN) 5 MG tablet, Take 1 tablet (5 mg total) by mouth 2 (two) times daily before a meal as needed., Disp: 60 tablet, Rfl: 0   metoprolol succinate (TOPROL-XL) 25 MG 24 hr tablet, Take 1 tablet (25 mg total) by mouth daily., Disp: 90 tablet, Rfl: 3   mirtazapine (REMERON) 15 MG tablet, Take 1 tablet (15 mg total) by mouth at bedtime., Disp: 90 tablet, Rfl: 3   nitroGLYCERIN (NITROSTAT) 0.4 MG SL tablet, Place 1 tablet (0.4 mg total) under the tongue every 5 (five) minutes as needed for chest pain. If no relief after  3 doses call 911, Disp: 25 tablet, Rfl: 3   oxyCODONE-acetaminophen (PERCOCET/ROXICET) 5-325 MG tablet, Take 1 tablet by mouth every 6 (six) hours as needed for moderate pain. (Patient not taking: Reported on 08/19/2022), Disp: 30 tablet, Rfl: 0   pantoprazole (PROTONIX) 40 MG tablet, Take 1 tablet (40 mg total) by mouth 2 (two) times daily., Disp: 180 tablet, Rfl: 0   promethazine (PHENERGAN) 25 MG tablet, Take 1 tablet (25 mg total) by mouth every 8 (eight) hours as needed for nausea or vomiting., Disp: 20 tablet, Rfl: 0   rosuvastatin (CRESTOR) 5 MG tablet, Take 1 tablet (5 mg total) by mouth daily., Disp: 30 tablet, Rfl: 11   sertraline  (ZOLOFT) 50 MG tablet, Take 1 tablet (50 mg total) by mouth daily., Disp: 90 tablet, Rfl: 0   sitaGLIPtin (JANUVIA) 100 MG tablet, Take 1 tablet (100 mg total) by mouth daily., Disp: 90 tablet, Rfl: 4   traMADol (ULTRAM) 50 MG tablet, Take 50 mg by mouth every 6 (six) hours as needed for moderate pain., Disp: , Rfl:    VITAMIN D PO, Take 1 tablet by mouth daily., Disp: , Rfl:    Allergies  Allergen Reactions   Humibid La [Guaifenesin] Other (See Comments)    hallucinations    Past Medical History:  Diagnosis Date   Adenomatous polyps    Anxiety    Arthritis    Back pain    Barrett's esophagus    Coronary artery disease    Depression    Diabetes mellitus without complication (HCC)    Failure to thrive in adult    Gastroparesis    GERD (gastroesophageal reflux disease)    Hyperlipidemia    Hypertension    Lumbar disc disease    Lumbar spinal stenosis    Myocardial infarction [facility]) 1997   Thoracic myelopathy 2020     Past Surgical History:  Procedure Laterality Date   ABDOMINAL AORTOGRAM W/LOWER EXTREMITY Left 05/15/2022   Procedure: ABDOMINAL AORTOGRAM W/LOWER EXTREMITY;  Surgeon: Angelia Mould, MD;  Location: [place] [facility];  Service: Cardiovascular;  Laterality: Left;   AMPUTATION TOE Left 08/21/2022   Procedure: AMPUTATION TOE INTERPHALANGEAL JOINT BIG TOE;  Surgeon: Trula Slade, DPM;  Location: [facility];  Service: Podiatry;  Laterality: Left;   BACK SURGERY     microdiskectomy x2   BIOPSY  06/21/2018   Procedure: BIOPSY;  Surgeon: Wilford Corner, MD;  Location: [facility];  Service: Endoscopy;;   BIOPSY  02/06/2020   Procedure: BIOPSY;  Surgeon: Wilford Corner, MD;  Location: [facility];  Service: Endoscopy;;   CATARACT EXTRACTION W/ INTRAOCULAR LENS IMPLANT Bilateral    COLONOSCOPY  07/12/2012   Procedure: COLONOSCOPY;  Surgeon: Lear Ng, MD;  Location: [facility];  Service: Endoscopy;  Laterality: N/A;   COLONOSCOPY WITH  PROPOFOL N/A 06/21/2018   Procedure: COLONOSCOPY WITH PROPOFOL;  Surgeon: Wilford Corner, MD;  [facility]: [facility];  Service: Endoscopy;  Laterality: N/A;   COLONOSCOPY WITH PROPOFOL N/A 02/06/2020   Procedure: COLONOSCOPY WITH PROPOFOL;  Surgeon: Wilford Corner, MD;  Location: [facility];  Service: Endoscopy;  Laterality: N/A;   ESOPHAGEAL MANOMETRY N/A 11/15/2017   Procedure: ESOPHAGEAL MANOMETRY (EM);  Surgeon: Wilford Corner, MD;  Location: [facility];  Service: Endoscopy;  Laterality: N/A;   ESOPHAGEAL MANOMETRY  01/29/2021   Procedure: ESOPHAGEAL MANOMETRY (EM);  Surgeon: Gatha Mayer, MD;  Location: [facility];  Service: Endoscopy;;   ESOPHAGOGASTRODUODENOSCOPY  07/12/2012   Procedure: ESOPHAGOGASTRODUODENOSCOPY (EGD);  Surgeon:  Lear Ng, MD;  Location: [facility];  Service: Endoscopy;  Laterality: N/A;   ESOPHAGOGASTRODUODENOSCOPY (EGD) WITH PROPOFOL N/A 06/21/2018   Procedure: ESOPHAGOGASTRODUODENOSCOPY (EGD) WITH PROPOFOL;  Surgeon: Wilford Corner, MD;  [facility];  Service: Endoscopy;  Laterality: N/A;   ESOPHAGOGASTRODUODENOSCOPY (EGD) WITH PROPOFOL N/A 02/06/2020   Procedure: ESOPHAGOGASTRODUODENOSCOPY (EGD) WITH PROPOFOL;  Surgeon: Wilford Corner, MD;  Location: [facility];  Service: Endoscopy;  Laterality: N/A;   FEMORAL-TIBIAL BYPASS GRAFT Left 06/23/2022   Procedure: BYPASS GRAFT FEMORAL-TIBIAL ARTERY;  Surgeon: Angelia Mould, MD;  Location: [place];  Service: Vascular;  Laterality: Left;   FOREIGN BODY RETRIEVAL N/A 02/06/2020   Procedure: FOREIGN BODY RETRIEVAL;  Surgeon: Wilford Corner, MD;  Location: [facility];  Service: Endoscopy;  Laterality: N/A;   INTRAVASCULAR ULTRASOUND/IVUS N/A 08/24/2018   Procedure: Intravascular Ultrasound/IVUS;  Surgeon: Belva Crome, MD;  Location: [place] [facility];  Service: Cardiovascular;  Laterality: N/A;   KNEE ARTHROSCOPY  09/21/1986   Left   LEFT HEART CATH AND  CORONARY ANGIOGRAPHY N/A 08/24/2018   Procedure: LEFT HEART CATH AND CORONARY ANGIOGRAPHY;  Surgeon: Belva Crome, MD;  Location: [place] [facility];  Service: Cardiovascular;  Laterality: N/A;   POLYPECTOMY  06/21/2018   Procedure: POLYPECTOMY;  Surgeon: Wilford Corner, MD;  [facility];  Service: Endoscopy;;   POLYPECTOMY  02/06/2020   Procedure: POLYPECTOMY;  Surgeon: Wilford Corner, MD;  Location: [facility];  Service: Endoscopy;;   SPINAL CORD STIMULATOR IMPLANT     SPINAL CORD STIMULATOR REMOVAL     SPINAL FUSION     x 3   THORACIC DISCECTOMY N/A 03/15/2019   Procedure: Laminectomy and Foraminotomy - Thoracic eleven -twelve;  Surgeon: Kary Kos, MD;  Location: [place];  Service: Neurosurgery;  Laterality: N/A;    Family History  Problem Relation Age of Onset   Breast cancer Mother    Lung cancer Father    Liver disease Brother    Colon cancer Neg Hx     Social History   Tobacco Use   Smoking status: Former    Packs/day: 1.00    Years: 20.00    Total pack years: 20.00    Types: Cigarettes    Quit date: 04/09/2012    Years since quitting: 10.4   Smokeless tobacco: Never   Tobacco comments:    smoking THC for pain/appetite   Vaping Use   Vaping Use: Never used  Substance Use Topics   Alcohol use: Yes    Comment: Maybe 1 glass of wine once a month, socially   Drug use: Yes    Types: Marijuana    Comment: lunch and bedtime, occasionally, just to have an appetite. Not every day per pt    ROS   Objective:   Vitals: BP (!) 140/85 (BP Location: Left Arm)   Pulse 73   Temp 98.6 F (37 C) (Oral)   Resp 18   SpO2 96%   Physical Exam Constitutional:      General: He is not in acute distress.    Appearance: Normal appearance. He is well-developed and normal weight. He is not ill-appearing, toxic-appearing or diaphoretic.  HENT:     Head: Normocephalic and atraumatic.     Right Ear: External ear normal.     Left Ear: External ear normal.      Nose: Nose normal.     Mouth/Throat:     Pharynx: Oropharynx is clear. No pharyngeal swelling, oropharyngeal exudate, posterior oropharyngeal erythema or uvula  swelling.     Tonsils: No tonsillar exudate or tonsillar abscesses. 0 on the right. 0 on the left.  Eyes:     General: No scleral icterus.       Right eye: No discharge.        Left eye: No discharge.     Extraocular Movements: Extraocular movements intact.  Cardiovascular:     Rate and Rhythm: Normal rate.  Pulmonary:     Effort: Pulmonary effort is normal.  Abdominal:     General: Bowel sounds are normal. There is no distension.     Palpations: Abdomen is soft. There is no mass.     Tenderness: There is abdominal tenderness in the right upper quadrant, right lower quadrant, periumbilical area, suprapubic area and left lower quadrant. There is no right CVA tenderness, left CVA tenderness, guarding or rebound.  Musculoskeletal:     Cervical back: Normal range of motion.  Neurological:     Mental Status: He is alert and oriented to person, place, and time.  Psychiatric:        Mood and Affect: Mood normal.        Behavior: Behavior normal.        Thought Content: Thought content normal.        Judgment: Judgment normal.     DG Abd 1 View  Result Date: 09/07/2022 CLINICAL DATA:  Abdominal pain and constipation EXAM: ABDOMEN - 1 VIEW COMPARISON:  04/28/2022 FINDINGS: Large amount of fecal matter throughout the colon consistent with the clinical history of constipation. Small bowel pattern is normal. No significant soft tissue calcifications. Chronic lumbar fusion. IMPRESSION: Large amount of fecal matter throughout the colon consistent with the clinical history of constipation. Electronically Signed   By: Nelson Chimes M.D.   On: 09/07/2022 15:01     Assessment and Plan :   PDMP not reviewed this encounter.  1. Constipation, unspecified constipation type     No signs of bowel obstruction.  Recommended enema and/or MiraLAX  to help with having a complete bowel movement.  No signs of an acute abdomen. Counseled patient on potential for adverse effects with medications prescribed/recommended today, ER and return-to-clinic precautions discussed, patient verbalized understanding.    Jaynee Eagles, [place] 09/08/22 [phone]

## 2022-09-07 NOTE — Discharge Instructions (Addendum)
For moderate to severe constipation (not having a bowel movement in more than 3 days) then try to use an enema or Miralax once daily until you have a good bowel movement.  It is not a good idea to use an enema or laxatives daily. If you find you are doing this, then please follow up with a gastroenterologist. Otherwise, a medication you could use daily to help with promoting bowel movements is docusate (Colace) 171m. It is okay to use this 1-2 times daily as a stool softener.  Try to stay active physically including regular exercise 2-3 times a week.  Make sure you hydrate well every day with about 64 ounces of water daily (that is 2 liters).  Try to avoid carb heavy foods, dairy. This includes cutting out breads, pasta, pizza, pastries, potatoes, rice, starchy foods in general. Eat more fiber as listed below:  Salads - kale, spinach, cabbage, spring mix, arugula Fruits - avocadoes, berries (blueberries, raspberries, blackberries), apples, oranges, pomegranate, grapefruit, kiwi Vegetables - asparagus, cauliflower, broccoli, green beans, brussel sprouts, bell peppers, beets; stay away from or limit starchy vegetables like potatoes, carrots, peas Other general foods - kidney beans, egg whites, almonds, walnuts, sunflower seeds, pumpkin seeds, fat free yogurt, almond milk, flax seeds, quinoa, oats  Meat - It is better to eat lean meats and limit your red meat including pork to once a week.  Wild caught fish, chicken breast are good options as they tend to be leaner sources of good protein. Still be mindful of the sodium labels for the meats you buy.  DO NOT EAT ANY FOODS ON THIS LIST THAT YOU ARE ALLERGIC TO. For more specific needs, I highly recommend consulting a dietician or nutritionist but this can definitely be a good starting point.

## 2022-09-09 NOTE — Progress Notes (Unsigned)
POST OPERATIVE OFFICE NOTE    CC:  F/u for surgery  HPI:  This is a 71 y.o. male who is s/p Left common femoral artery to below-knee popliteal artery bypass with nonreversed translocated saphenous vein graft  on 06/23/22 by Dr. Scot Dock.  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 On post operative follow up he had a superficial dehiscence of his left groin incision.   He underwent short course of antibiotics. At his subsequent follow up his incisions were all healing well and the left groin was almost completely healed. He did recently undergo left great toe amputation on 08/21/22 by Dr. Jacqualyn Posey due to his gangrene.   Pt returns today for follow up wound check as well as non invasive studies.  Pt states ***   Allergies  Allergen Reactions   Humibid La [Guaifenesin] Other (See Comments)    hallucinations    Current Outpatient Medications  Medication Sig Dispense Refill   amoxicillin-clavulanate (AUGMENTIN) 875-125 MG tablet Take 1 tablet by mouth 2 (two) times daily. 20 tablet 0   aspirin EC 81 MG tablet Take 1 tablet (81 mg total) by mouth daily. 90 tablet 3   carboxymethylcellulose (REFRESH PLUS) 0.5 % SOLN Place 1 drop into both eyes 3 (three) times daily as needed (dry eyes).     docusate sodium (COLACE) 100 MG capsule Take 1 capsule (100 mg total) by mouth every 12 (twelve) hours. 180 capsule 0   empagliflozin (JARDIANCE) 10 MG TABS tablet Take 1 tablet (10 mg total) by mouth daily. (Patient not taking: Reported on 08/19/2022) 90 tablet 4   glimepiride (AMARYL) 4 MG tablet TAKE 2 TABLETS BY MOUTH ONCE A DAY 180 tablet 1   glucose blood (FREESTYLE LITE) test strip Use to check blood glucose 2 times daily 200 strip 4   glucose blood test strip use 1 strip to check blood glucose 2 (two) times daily. 200 each 3   HYDROcodone-acetaminophen (NORCO/VICODIN) 5-325 MG tablet Take 1-2 tablets by mouth every 6 (six) hours as needed. 15  tablet 0   ibuprofen (ADVIL,MOTRIN) 200 MG tablet Take 600-800 mg by mouth every 8 (eight) hours as needed for moderate pain.      insulin degludec (TRESIBA FLEXTOUCH) 100 UNIT/ML FlexTouch Pen Inject 8 Units into the skin daily in the evening 3 mL 5   Insulin Pen Needle (BD PEN NEEDLE NANO 2ND GEN) 32G X 4 MM MISC Use as directed once a day 100 each 6   Insulin Pen Needle (TECHLITE PEN NEEDLES) 32G X 4 MM MISC Use daily as directed 50 each 6   lidocaine (LIDODERM) 5 % Apply 2 patches to affected area daily for up to 12 hours.  Remove for 12 hours & repeat as directed. (Patient taking differently: Place 1 patch onto the skin daily as needed (pain).) 60 patch 11   metFORMIN (GLUCOPHAGE-XR) 500 MG 24 hr tablet TAKE 1 TABLET BY MOUTH TWO TIMES DAILY 60 tablet 6   metoCLOPramide (REGLAN) 5 MG tablet Take 1 tablet (5 mg total) by mouth 2 (two) times daily before a meal as needed. 60 tablet 0   metoprolol succinate (TOPROL-XL) 25 MG 24 hr tablet Take 1 tablet (25 mg total) by mouth daily. 90 tablet 3   mirtazapine (REMERON) 15 MG tablet Take 1 tablet (15 mg total) by mouth at bedtime. 90 tablet 3   nitroGLYCERIN (NITROSTAT) 0.4 MG SL tablet Place 1 tablet (0.4 mg total) under the tongue every  5 (five) minutes as needed for chest pain. If no relief after 3 doses call 911 25 tablet 3   oxyCODONE-acetaminophen (PERCOCET/ROXICET) 5-325 MG tablet Take 1 tablet by mouth every 6 (six) hours as needed for moderate pain. (Patient not taking: Reported on 08/19/2022) 30 tablet 0   pantoprazole (PROTONIX) 40 MG tablet Take 1 tablet (40 mg total) by mouth 2 (two) times daily. 180 tablet 0   polyethylene glycol powder (MIRALAX) 17 GM/SCOOP powder Take 255 g by mouth daily as needed for moderate constipation or severe constipation. 510 g 0   promethazine (PHENERGAN) 25 MG tablet Take 1 tablet (25 mg total) by mouth every 8 (eight) hours as needed for nausea or vomiting. 20 tablet 0   rosuvastatin (CRESTOR) 5 MG tablet  Take 1 tablet (5 mg total) by mouth daily. 30 tablet 11   sertraline (ZOLOFT) 50 MG tablet Take 1 tablet (50 mg total) by mouth daily. 90 tablet 0   sitaGLIPtin (JANUVIA) 100 MG tablet Take 1 tablet (100 mg total) by mouth daily. 90 tablet 4   sodium phosphate (FLEET) 7-19 GM/118ML ENEM Place 133 mLs (1 enema total) rectally daily as needed for severe constipation. 233 mL 0   traMADol (ULTRAM) 50 MG tablet Take 50 mg by mouth every 6 (six) hours as needed for moderate pain.     VITAMIN D PO Take 1 tablet by mouth daily.     No current facility-administered medications for this visit.     ROS:  See HPI  Physical Exam:  ***  Incision:  *** Extremities:  *** Neuro: *** Abdomen:  ***    Assessment/Plan:  This is a 71 y.o. male who is s/p: Left common femoral artery to below-knee popliteal artery bypass with nonreversed translocated saphenous vein graft  on 06/23/22 by Dr. Scot Dock. ***  -***   Leontine Locket, [facility] MD:  Donzetta Matters

## 2022-09-10 ENCOUNTER — Ambulatory Visit (HOSPITAL_COMMUNITY): Payer: PPO

## 2022-09-10 ENCOUNTER — Ambulatory Visit: Payer: PPO

## 2022-09-10 DIAGNOSIS — I739 Peripheral vascular disease, unspecified: Secondary | ICD-10-CM

## 2022-09-10 DIAGNOSIS — I70269 Atherosclerosis of native arteries of extremities with gangrene, unspecified extremity: Secondary | ICD-10-CM

## 2022-09-17 ENCOUNTER — Telehealth: Payer: Self-pay | Admitting: Podiatry

## 2022-09-17 ENCOUNTER — Encounter: Payer: HMO | Admitting: Podiatry

## 2022-09-17 ENCOUNTER — Other Ambulatory Visit (HOSPITAL_COMMUNITY): Payer: Self-pay

## 2022-09-17 NOTE — Telephone Encounter (Signed)
Pts wife called to rs pts post op #3 appt this morning due to pt not feeling well. I offered pt to be seen on the following Tues 1/2 at 9am, declined due to pt is not an early riser. I offered the same day at 2pm, declined, he has another appt closer to this time.

## 2022-09-22 ENCOUNTER — Encounter: Payer: Self-pay | Admitting: Podiatry

## 2022-09-22 ENCOUNTER — Ambulatory Visit (INDEPENDENT_AMBULATORY_CARE_PROVIDER_SITE_OTHER): Payer: PPO | Admitting: Podiatry

## 2022-09-22 VITALS — BP 111/66 | HR 81 | Temp 97.0°F

## 2022-09-22 DIAGNOSIS — I96 Gangrene, not elsewhere classified: Secondary | ICD-10-CM

## 2022-09-22 DIAGNOSIS — M86172 Other acute osteomyelitis, left ankle and foot: Secondary | ICD-10-CM | POA: Diagnosis not present

## 2022-09-23 NOTE — Progress Notes (Signed)
Subjective: Chief Complaint  Patient presents with   Routine Post Op    POV #3 DOS 08/21/2022 LT BIG TOE PARTIAL VS TOTAL TOE AMPUTATION    Justin Key is a 72 y.o. is seen today in office s/p left partial hallux amputation preformed on 08/21/2022.  Presents today for suture removal.  States he is doing well.  Denies any pain or any fevers or chills.  Objective: General: No acute distress, AAOx3  Foot appears to be well-perfused. Sensation decreased Left foot: Incision is well coapted without any evidence of dehiscence.  Dry scab is present on the incision.  The skin is dry with no other skin breakdown or open lesions noted bilaterally. No pain with calf compression, swelling, warmth, erythema.   Assessment and Plan:  Status post left partial hallux amputation, doing well with no complications   -Treatment options discussed including all alternatives, risks, and complications -I remove the remainder of the sutures today without complications incisions well coapted.  Steri-Strips were applied for reinforcement.  Antibiotic ointment and dressing applied.  Discussed that he can wash with soap and water, dry thoroughly and apply a similar bandage.  He can use moisturizer to the foot to help with the dry skin. -Monitor for any clinical signs or symptoms of infection and directed to call the office immediately should any occur or go to the ER.  No follow-ups on file.  Trula Slade DPM

## 2022-09-25 ENCOUNTER — Ambulatory Visit (HOSPITAL_COMMUNITY): Payer: PPO

## 2022-09-25 ENCOUNTER — Ambulatory Visit: Payer: PPO

## 2022-09-28 ENCOUNTER — Other Ambulatory Visit (HOSPITAL_COMMUNITY): Payer: Self-pay

## 2022-09-29 ENCOUNTER — Other Ambulatory Visit (HOSPITAL_COMMUNITY): Payer: Self-pay

## 2022-09-29 DIAGNOSIS — I739 Peripheral vascular disease, unspecified: Secondary | ICD-10-CM | POA: Diagnosis not present

## 2022-09-29 DIAGNOSIS — I10 Essential (primary) hypertension: Secondary | ICD-10-CM | POA: Diagnosis not present

## 2022-09-29 DIAGNOSIS — S98132A Complete traumatic amputation of one left lesser toe, initial encounter: Secondary | ICD-10-CM | POA: Diagnosis not present

## 2022-09-29 DIAGNOSIS — E1165 Type 2 diabetes mellitus with hyperglycemia: Secondary | ICD-10-CM | POA: Diagnosis not present

## 2022-09-29 DIAGNOSIS — N1831 Chronic kidney disease, stage 3a: Secondary | ICD-10-CM | POA: Diagnosis not present

## 2022-09-29 DIAGNOSIS — E7800 Pure hypercholesterolemia, unspecified: Secondary | ICD-10-CM | POA: Diagnosis not present

## 2022-09-29 MED ORDER — GLIMEPIRIDE 4 MG PO TABS
8.0000 mg | ORAL_TABLET | Freq: Every day | ORAL | 1 refills | Status: DC
  Filled 2022-09-29 – 2022-09-30 (×2): qty 180, 90d supply, fill #0
  Filled 2022-10-02: qty 14, 7d supply, fill #0
  Filled 2022-10-20: qty 180, 90d supply, fill #0
  Filled 2023-01-05 – 2023-01-06 (×2): qty 180, 90d supply, fill #1

## 2022-09-30 ENCOUNTER — Other Ambulatory Visit (HOSPITAL_COMMUNITY): Payer: Self-pay

## 2022-10-01 ENCOUNTER — Ambulatory Visit: Payer: PPO

## 2022-10-01 ENCOUNTER — Encounter (HOSPITAL_COMMUNITY): Payer: PPO

## 2022-10-01 ENCOUNTER — Other Ambulatory Visit (HOSPITAL_COMMUNITY): Payer: PPO

## 2022-10-02 ENCOUNTER — Other Ambulatory Visit (HOSPITAL_COMMUNITY): Payer: Self-pay

## 2022-10-08 ENCOUNTER — Ambulatory Visit (INDEPENDENT_AMBULATORY_CARE_PROVIDER_SITE_OTHER)
Admission: RE | Admit: 2022-10-08 | Discharge: 2022-10-08 | Disposition: A | Discharge status: Home / Self Care | Payer: PPO | Source: Ambulatory Visit | Attending: Vascular Surgery | Admitting: Vascular Surgery

## 2022-10-08 ENCOUNTER — Ambulatory Visit: Payer: PPO

## 2022-10-08 ENCOUNTER — Ambulatory Visit (HOSPITAL_COMMUNITY)
Admission: RE | Admit: 2022-10-08 | Discharge: 2022-10-08 | Disposition: A | Discharge status: Home / Self Care | Payer: PPO | Source: Ambulatory Visit | Attending: Vascular Surgery | Admitting: Vascular Surgery

## 2022-10-08 DIAGNOSIS — I739 Peripheral vascular disease, unspecified: Secondary | ICD-10-CM

## 2022-10-08 LAB — VAS US ABI WITH/WO TBI
Left ABI: 0.9
Right ABI: 0.96

## 2022-10-13 ENCOUNTER — Telehealth: Payer: Self-pay

## 2022-10-13 ENCOUNTER — Ambulatory Visit (INDEPENDENT_AMBULATORY_CARE_PROVIDER_SITE_OTHER): Payer: PPO | Admitting: Podiatry

## 2022-10-13 DIAGNOSIS — M86172 Other acute osteomyelitis, left ankle and foot: Secondary | ICD-10-CM

## 2022-10-13 DIAGNOSIS — I96 Gangrene, not elsewhere classified: Secondary | ICD-10-CM | POA: Diagnosis not present

## 2022-10-13 NOTE — Telephone Encounter (Signed)
Pt's wife, Justin Key, called requesting his test results and to cancel his upcoming appt with the PA.  Reviewed pt's chart, returned call for clarification, two identifiers used. She stated that the pt has already had his post-op and his incisions were completely healed. He has no issues that he feels he needs to be seen in clinic for at this time. Offered to change the appt to a phone visit so they wouldn't cancel. Offer accepted and visit changed. Informed them that he will need to keep his phone with him in case the PA needed to call earlier or later. Confirmed the mobile # of [phone] is the best number to call. Confirmed understanding.

## 2022-10-15 ENCOUNTER — Other Ambulatory Visit (HOSPITAL_COMMUNITY): Payer: Self-pay

## 2022-10-15 ENCOUNTER — Ambulatory Visit (INDEPENDENT_AMBULATORY_CARE_PROVIDER_SITE_OTHER): Payer: PPO | Admitting: Physician Assistant

## 2022-10-15 ENCOUNTER — Encounter: Payer: Self-pay | Admitting: Physician Assistant

## 2022-10-15 DIAGNOSIS — I739 Peripheral vascular disease, unspecified: Secondary | ICD-10-CM | POA: Diagnosis not present

## 2022-10-15 DIAGNOSIS — I70269 Atherosclerosis of native arteries of extremities with gangrene, unspecified extremity: Secondary | ICD-10-CM

## 2022-10-15 NOTE — Progress Notes (Signed)
Subjective:  Chief Complaint  Patient presents with   Post-op Follow-up    POV #4 DOS 08/21/2022 LT BIG TOE PARTIAL VS TOTAL TOE AMPUTATION     Justin Key is a 72 y.o. is seen today in office s/p left partial hallux amputation preformed on 08/21/2022.  States he did prompt his toe.  Denies seizures or pus.  5 cm show he reports.  He is not interested.   Objective: General: No acute distress, AAOx3  Foot appears to be well-perfused. Sensation decreased Left foot: Incision appears to be coapted.  There is some scabbing, hyperpigmentation around the periphery of the incision I think a lot of this is coming from dried blood, scabbing.  There is no dehiscence noted there is noted there is no fluctuance or crepitation.  There is no malodor. No pain with calf compression, swelling, warmth, erythema.           Assessment and Plan:  Status post left partial hallux amputation, doing well with no complications   -Treatment options discussed including all alternatives, risks, and complications -I cleaned the incision.  Steri-Strips were applied for wound dressing.  Continue with daily dressing changes.  Continue surgical shoe for offloading.  Monitor for any signs or symptoms of infection.  Trula Slade DPM

## 2022-10-15 NOTE — Progress Notes (Signed)
Virtual Visit via Telephone Note   I connected with Justin Key on 10/15/2022 by telephone and verified that I was speaking with the correct person. Patient was located at home and accompanied by his wife. I am located at [facility] office.   The limitations of evaluation and management by telemedicine and the availability of in person appointments have been previously discussed with the patient and are documented in the patients chart. The patient expressed understanding and consented to proceed.  PCP: Kathyrn Lass, MD  Chief Complaint: bypass surveillance  History of Present Illness: Justin Key is a 72 y.o. male with history of left common femoral artery to below the knee popliteal bypass with vein by Dr. Scot Dock on 06/23/2022.  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  He subsequently underwent great toe amputation by podiatry.  He had some trouble healing his groin incision however did not require further operative intervention.  He states all incisions are well-healed.  He is following regularly with his podiatrist for monitoring of the great toe amputation site however believes it is healing well.  He is ambulatory without calf claudication.  He denies any rest pain or any further tissue loss.  He denies tobacco use.  He is on aspirin and a statin daily.  It should be noted that the patient requested a phone visit to review results of bypass duplex.  He preferred phone visit over office visit because he did not perceive any issues with his bypass.  Past Medical History:  Diagnosis Date   Adenomatous polyps    Anxiety    Arthritis    Back pain    Barrett's esophagus    Coronary artery disease    Depression    Diabetes mellitus without complication (HCC)    Failure to thrive in adult    Gastroparesis    GERD (gastroesophageal reflux disease)    Hyperlipidemia    Hypertension    Lumbar disc disease    Lumbar spinal stenosis    Myocardial infarction [facility]) 1997    Thoracic myelopathy 2020    Past Surgical History:  Procedure Laterality Date   ABDOMINAL AORTOGRAM W/LOWER EXTREMITY Left 05/15/2022   Procedure: ABDOMINAL AORTOGRAM W/LOWER EXTREMITY;  Surgeon: Angelia Mould, MD;  Location: [place] [facility];  Service: Cardiovascular;  Laterality: Left;   AMPUTATION TOE Left 08/21/2022   Procedure: AMPUTATION TOE INTERPHALANGEAL JOINT BIG TOE;  Surgeon: Trula Slade, DPM;  [facility];  Service: Podiatry;  Laterality: Left;   BACK SURGERY     microdiskectomy x2   BIOPSY  06/21/2018   Procedure: BIOPSY;  Surgeon: Wilford Corner, MD;  [facility];  Service: Endoscopy;;   BIOPSY  02/06/2020   Procedure: BIOPSY;  Surgeon: Wilford Corner, MD;  [facility];  Service: Endoscopy;;   CATARACT EXTRACTION W/ INTRAOCULAR LENS IMPLANT Bilateral    COLONOSCOPY  07/12/2012   Procedure: COLONOSCOPY;  Surgeon: Lear Ng, MD;  [facility];  Service: Endoscopy;  Laterality: N/A;   COLONOSCOPY WITH PROPOFOL N/A 06/21/2018   Procedure: COLONOSCOPY WITH PROPOFOL;  Surgeon: Wilford Corner, MD;  [facility];  Service: Endoscopy;  Laterality: N/A;   COLONOSCOPY WITH PROPOFOL N/A 02/06/2020   Procedure: COLONOSCOPY WITH PROPOFOL;  Surgeon: Wilford Corner, MD;  [facility];  Service: Endoscopy;  Laterality: N/A;   ESOPHAGEAL MANOMETRY N/A 11/15/2017   Procedure: ESOPHAGEAL MANOMETRY (EM);  Surgeon: Wilford Corner, MD;  [facility];  Service: Endoscopy;  Laterality:  N/A;   ESOPHAGEAL MANOMETRY  01/29/2021   Procedure: ESOPHAGEAL MANOMETRY (EM);  Surgeon: Gatha Mayer, MD;  [facility];  Service: Endoscopy;;   ESOPHAGOGASTRODUODENOSCOPY  07/12/2012   Procedure: ESOPHAGOGASTRODUODENOSCOPY (EGD);  Surgeon: Lear Ng, MD;  Location: [facility];  Service: Endoscopy;  Laterality: N/A;   ESOPHAGOGASTRODUODENOSCOPY (EGD) WITH PROPOFOL N/A 06/21/2018    Procedure: ESOPHAGOGASTRODUODENOSCOPY (EGD) WITH PROPOFOL;  Surgeon: Wilford Corner, MD;  [facility];  Service: Endoscopy;  Laterality: N/A;   ESOPHAGOGASTRODUODENOSCOPY (EGD) WITH PROPOFOL N/A 02/06/2020   Procedure: ESOPHAGOGASTRODUODENOSCOPY (EGD) WITH PROPOFOL;  Surgeon: Wilford Corner, MD;  [facility];  Service: Endoscopy;  Laterality: N/A;   FEMORAL-TIBIAL BYPASS GRAFT Left 06/23/2022   Procedure: BYPASS GRAFT FEMORAL-TIBIAL ARTERY;  Surgeon: Angelia Mould, MD;  Location: [place];  Service: Vascular;  Laterality: Left;   FOREIGN BODY RETRIEVAL N/A 02/06/2020   Procedure: FOREIGN BODY RETRIEVAL;  Surgeon: Wilford Corner, MD;  [facility];  Service: Endoscopy;  Laterality: N/A;   INTRAVASCULAR ULTRASOUND/IVUS N/A 08/24/2018   Procedure: Intravascular Ultrasound/IVUS;  Surgeon: Belva Crome, MD;  Location: [place] [facility];  Service: Cardiovascular;  Laterality: N/A;   KNEE ARTHROSCOPY  09/21/1986   Left   LEFT HEART CATH AND CORONARY ANGIOGRAPHY N/A 08/24/2018   Procedure: LEFT HEART CATH AND CORONARY ANGIOGRAPHY;  Surgeon: Belva Crome, MD;  Location: [place] [facility];  Service: Cardiovascular;  Laterality: N/A;   POLYPECTOMY  06/21/2018   Procedure: POLYPECTOMY;  Surgeon: Wilford Corner, MD;  [facility];  Service: Endoscopy;;   POLYPECTOMY  02/06/2020   Procedure: POLYPECTOMY;  Surgeon: Wilford Corner, MD;  [facility];  Service: Endoscopy;;   SPINAL CORD STIMULATOR IMPLANT     SPINAL CORD STIMULATOR REMOVAL     SPINAL FUSION     x 3   THORACIC DISCECTOMY N/A 03/15/2019   Procedure: Laminectomy and Foraminotomy - Thoracic eleven -twelve;  Surgeon: Kary Kos, MD;  Location: [place];  Service: Neurosurgery;  Laterality: N/A;    No outpatient medications have been marked as taking for the 10/15/22 encounter (Appointment) with [name], PA-C.    12 system ROS was negative unless otherwise noted  in HPI   Observations/Objective: Duplex was performed on 10/08/2022 This demonstrated patent left leg bypass with biphasic flow.  There are some areas of low velocities especially at the inflow and proximal anastomosis however ABIs were stable  Assessment and Plan: Patient states left great toe amputation seems to be healing well.  He is following regularly with his podiatrist who seems to be happy with the progress he has made.  He is without claudication or rest pain.  He does not perceive any issues with his bypass graft thus requested a phone visit today.  Duplex demonstrates a widely patent bypass however does have some borderline low flow volumes at the inflow and proximal anastomosis.  For this reason we will recheck bypass duplex in 3 months along with ABIs.  If these velocities persist he may require angiography.  He will continue his aspirin and statin daily.  He knows to call/return office sooner with any questions or concerns.  Follow Up Instructions:   Follow up in 3 month(s)   I discussed the assessment and treatment plan with the patient. The patient was provided an opportunity to ask questions and all were answered. The patient agreed with the plan and demonstrated an understanding of the instructions.   The patient was advised to call back or seek  an in-person evaluation if the symptoms worsen or if the condition fails to improve as anticipated.  I spent 14 minutes with the patient via telephone encounter.   Signed, [name] [facility] [place] Office: [phone]  10/15/2022, 3:27 PM

## 2022-10-16 ENCOUNTER — Other Ambulatory Visit (HOSPITAL_COMMUNITY): Payer: Self-pay

## 2022-10-21 ENCOUNTER — Other Ambulatory Visit: Payer: Self-pay

## 2022-10-21 ENCOUNTER — Other Ambulatory Visit (HOSPITAL_COMMUNITY): Payer: Self-pay

## 2022-10-21 DIAGNOSIS — I739 Peripheral vascular disease, unspecified: Secondary | ICD-10-CM

## 2022-10-21 DIAGNOSIS — I70269 Atherosclerosis of native arteries of extremities with gangrene, unspecified extremity: Secondary | ICD-10-CM

## 2022-10-27 ENCOUNTER — Other Ambulatory Visit (HOSPITAL_COMMUNITY): Payer: Self-pay

## 2022-11-10 ENCOUNTER — Ambulatory Visit (INDEPENDENT_AMBULATORY_CARE_PROVIDER_SITE_OTHER): Payer: PPO | Admitting: Podiatry

## 2022-11-10 ENCOUNTER — Ambulatory Visit: Payer: PPO

## 2022-11-10 DIAGNOSIS — M86172 Other acute osteomyelitis, left ankle and foot: Secondary | ICD-10-CM

## 2022-11-10 DIAGNOSIS — I96 Gangrene, not elsewhere classified: Secondary | ICD-10-CM | POA: Diagnosis not present

## 2022-11-13 ENCOUNTER — Other Ambulatory Visit (HOSPITAL_COMMUNITY): Payer: Self-pay

## 2022-11-17 NOTE — Progress Notes (Signed)
Subjective:  Chief Complaint  Patient presents with   Routine Post Op    Rm 13  DOS 08/21/2022 LT BIG TOE PARTIAL VS TOTAL TOE AMPUTATION. Pt refused Xrays. No concerns. Pt states he is maintaing,      Justin Key is a 72 y.o. is seen today in office s/p left partial hallux amputation preformed on 08/21/2022.  States that he is doing well and the incision is healed.  He has not seen any open lesions.  No drainage.  No present swelling.  Denies any fevers or chills.  No other concerns.  Objective: General: No acute distress, AAOx3  Foot appears to be well-perfused. Sensation decreased Left foot: Incision appears to be coapted and appears to be healed.  Scar is formed.  There is some trace edema but there is no erythema or warmth.  There is no fluctuation or crepitation.  There is no malodor.  No open lesions noted.  On the dorsal aspect the left second toe it is preulcerative from rubbing and he is to monitor this closely.   No pain with calf compression, swelling, warmth, erythema.    Assessment and Plan:  Status post left partial hallux amputation, doing well with no complications   -Treatment options discussed including all alternatives, risks, and complications -Incision appears to be healed.  Discussed transition to regular shoe as tolerated but he is to monitor the second toe very closely.  Offloading. -Daily foot inspection  Return in about 3 months (around 02/08/2023).  Trula Slade DPM

## 2022-11-25 DIAGNOSIS — E7800 Pure hypercholesterolemia, unspecified: Secondary | ICD-10-CM | POA: Diagnosis not present

## 2022-11-25 DIAGNOSIS — I739 Peripheral vascular disease, unspecified: Secondary | ICD-10-CM | POA: Diagnosis not present

## 2022-11-25 DIAGNOSIS — E1165 Type 2 diabetes mellitus with hyperglycemia: Secondary | ICD-10-CM | POA: Diagnosis not present

## 2022-11-25 DIAGNOSIS — N1831 Chronic kidney disease, stage 3a: Secondary | ICD-10-CM | POA: Diagnosis not present

## 2022-11-25 DIAGNOSIS — I10 Essential (primary) hypertension: Secondary | ICD-10-CM | POA: Diagnosis not present

## 2022-11-30 ENCOUNTER — Other Ambulatory Visit (HOSPITAL_COMMUNITY): Payer: Self-pay

## 2022-12-09 ENCOUNTER — Other Ambulatory Visit (HOSPITAL_COMMUNITY): Payer: Self-pay

## 2022-12-09 MED ORDER — METFORMIN HCL ER 500 MG PO TB24
500.0000 mg | ORAL_TABLET | Freq: Two times a day (BID) | ORAL | 6 refills | Status: DC
  Filled 2022-12-09: qty 60, 30d supply, fill #0
  Filled 2023-01-04: qty 60, 30d supply, fill #1
  Filled 2023-01-30: qty 60, 30d supply, fill #2
  Filled 2023-03-02: qty 60, 30d supply, fill #3
  Filled 2023-04-06: qty 60, 30d supply, fill #4
  Filled 2023-05-05: qty 60, 30d supply, fill #5
  Filled 2023-06-22: qty 60, 30d supply, fill #6

## 2022-12-10 ENCOUNTER — Other Ambulatory Visit: Payer: Self-pay

## 2022-12-11 ENCOUNTER — Other Ambulatory Visit (HOSPITAL_COMMUNITY): Payer: Self-pay

## 2022-12-12 ENCOUNTER — Other Ambulatory Visit (HOSPITAL_COMMUNITY): Payer: Self-pay

## 2022-12-13 ENCOUNTER — Other Ambulatory Visit (HOSPITAL_COMMUNITY): Payer: Self-pay

## 2022-12-14 ENCOUNTER — Other Ambulatory Visit (HOSPITAL_COMMUNITY): Payer: Self-pay

## 2022-12-14 MED ORDER — PANTOPRAZOLE SODIUM 40 MG PO TBEC
40.0000 mg | DELAYED_RELEASE_TABLET | Freq: Two times a day (BID) | ORAL | 0 refills | Status: DC
  Filled 2022-12-14: qty 180, 90d supply, fill #0

## 2022-12-15 ENCOUNTER — Other Ambulatory Visit (HOSPITAL_COMMUNITY): Payer: Self-pay

## 2022-12-15 MED ORDER — TRESIBA FLEXTOUCH 100 UNIT/ML ~~LOC~~ SOPN
10.0000 [IU] | PEN_INJECTOR | Freq: Every evening | SUBCUTANEOUS | 3 refills | Status: DC
  Filled 2022-12-15: qty 9, 90d supply, fill #0
  Filled 2023-03-02: qty 9, 90d supply, fill #1
  Filled 2023-05-13: qty 9, 90d supply, fill #2
  Filled 2023-07-31: qty 9, 90d supply, fill #3

## 2023-01-04 ENCOUNTER — Other Ambulatory Visit (HOSPITAL_COMMUNITY): Payer: Self-pay

## 2023-01-05 ENCOUNTER — Other Ambulatory Visit (HOSPITAL_COMMUNITY): Payer: Self-pay

## 2023-01-06 ENCOUNTER — Other Ambulatory Visit (HOSPITAL_COMMUNITY): Payer: Self-pay

## 2023-01-07 ENCOUNTER — Other Ambulatory Visit: Payer: Self-pay | Admitting: Physician Assistant

## 2023-01-07 ENCOUNTER — Other Ambulatory Visit (HOSPITAL_COMMUNITY): Payer: Self-pay

## 2023-01-08 ENCOUNTER — Other Ambulatory Visit (HOSPITAL_COMMUNITY): Payer: Self-pay

## 2023-01-08 MED ORDER — ROSUVASTATIN CALCIUM 5 MG PO TABS
5.0000 mg | ORAL_TABLET | Freq: Every day | ORAL | 11 refills | Status: DC
  Filled 2023-01-08 – 2023-01-30 (×2): qty 30, 30d supply, fill #0
  Filled 2023-03-17: qty 30, 30d supply, fill #1
  Filled 2023-04-21: qty 30, 30d supply, fill #2
  Filled 2023-06-12: qty 30, 30d supply, fill #3
  Filled 2023-08-02: qty 30, 30d supply, fill #4
  Filled 2023-09-23: qty 30, 30d supply, fill #5
  Filled 2023-11-18: qty 30, 30d supply, fill #6
  Filled 2024-01-07: qty 30, 30d supply, fill #7

## 2023-01-14 ENCOUNTER — Ambulatory Visit: Payer: PPO

## 2023-01-14 ENCOUNTER — Ambulatory Visit (HOSPITAL_COMMUNITY): Payer: PPO

## 2023-01-15 ENCOUNTER — Other Ambulatory Visit (HOSPITAL_COMMUNITY): Payer: Self-pay

## 2023-01-15 MED ORDER — SERTRALINE HCL 50 MG PO TABS
50.0000 mg | ORAL_TABLET | Freq: Every day | ORAL | 0 refills | Status: DC
  Filled 2023-01-15: qty 30, 30d supply, fill #0

## 2023-01-16 ENCOUNTER — Other Ambulatory Visit (HOSPITAL_COMMUNITY): Payer: Self-pay

## 2023-01-20 DIAGNOSIS — E7800 Pure hypercholesterolemia, unspecified: Secondary | ICD-10-CM | POA: Diagnosis not present

## 2023-01-20 DIAGNOSIS — E1165 Type 2 diabetes mellitus with hyperglycemia: Secondary | ICD-10-CM | POA: Diagnosis not present

## 2023-01-27 DIAGNOSIS — I739 Peripheral vascular disease, unspecified: Secondary | ICD-10-CM | POA: Diagnosis not present

## 2023-01-27 DIAGNOSIS — E1165 Type 2 diabetes mellitus with hyperglycemia: Secondary | ICD-10-CM | POA: Diagnosis not present

## 2023-01-27 DIAGNOSIS — I10 Essential (primary) hypertension: Secondary | ICD-10-CM | POA: Diagnosis not present

## 2023-01-27 DIAGNOSIS — E7800 Pure hypercholesterolemia, unspecified: Secondary | ICD-10-CM | POA: Diagnosis not present

## 2023-01-27 DIAGNOSIS — S98132A Complete traumatic amputation of one left lesser toe, initial encounter: Secondary | ICD-10-CM | POA: Diagnosis not present

## 2023-02-01 ENCOUNTER — Other Ambulatory Visit: Payer: Self-pay

## 2023-02-01 ENCOUNTER — Other Ambulatory Visit (HOSPITAL_COMMUNITY): Payer: Self-pay

## 2023-02-08 ENCOUNTER — Other Ambulatory Visit (HOSPITAL_COMMUNITY): Payer: Self-pay

## 2023-02-12 ENCOUNTER — Other Ambulatory Visit (HOSPITAL_COMMUNITY): Payer: Self-pay

## 2023-02-12 MED ORDER — METOCLOPRAMIDE HCL 5 MG PO TABS
5.0000 mg | ORAL_TABLET | Freq: Two times a day (BID) | ORAL | 0 refills | Status: DC | PRN
  Filled 2023-02-12: qty 60, 30d supply, fill #0
  Filled 2023-02-12: qty 60, 5d supply, fill #0
  Filled 2023-02-13 (×2): qty 60, 30d supply, fill #0

## 2023-02-13 ENCOUNTER — Other Ambulatory Visit (HOSPITAL_COMMUNITY): Payer: Self-pay

## 2023-02-16 ENCOUNTER — Other Ambulatory Visit (HOSPITAL_COMMUNITY): Payer: Self-pay

## 2023-02-19 ENCOUNTER — Other Ambulatory Visit (HOSPITAL_COMMUNITY): Payer: Self-pay

## 2023-02-26 ENCOUNTER — Other Ambulatory Visit: Payer: Self-pay

## 2023-02-26 ENCOUNTER — Other Ambulatory Visit (HOSPITAL_COMMUNITY): Payer: Self-pay

## 2023-02-27 ENCOUNTER — Other Ambulatory Visit (HOSPITAL_COMMUNITY): Payer: Self-pay

## 2023-03-01 ENCOUNTER — Other Ambulatory Visit (HOSPITAL_COMMUNITY): Payer: Self-pay

## 2023-03-01 ENCOUNTER — Other Ambulatory Visit: Payer: Self-pay

## 2023-03-01 MED ORDER — METOPROLOL SUCCINATE ER 25 MG PO TB24
25.0000 mg | ORAL_TABLET | Freq: Every day | ORAL | 0 refills | Status: DC
  Filled 2023-03-01: qty 90, 90d supply, fill #0

## 2023-03-01 NOTE — Telephone Encounter (Signed)
Pt's medication was sent to pt's pharmacy as requested. Confirmation received.  °

## 2023-03-04 ENCOUNTER — Other Ambulatory Visit (HOSPITAL_COMMUNITY): Payer: Self-pay

## 2023-03-04 ENCOUNTER — Ambulatory Visit: Payer: PPO | Admitting: Physician Assistant

## 2023-03-04 ENCOUNTER — Ambulatory Visit (HOSPITAL_COMMUNITY)
Admission: RE | Admit: 2023-03-04 | Discharge: 2023-03-04 | Disposition: A | Discharge status: Home / Self Care | Payer: PPO | Source: Ambulatory Visit | Attending: Vascular Surgery | Admitting: Vascular Surgery

## 2023-03-04 ENCOUNTER — Ambulatory Visit (INDEPENDENT_AMBULATORY_CARE_PROVIDER_SITE_OTHER)
Admission: RE | Admit: 2023-03-04 | Discharge: 2023-03-04 | Disposition: A | Discharge status: Home / Self Care | Payer: PPO | Source: Ambulatory Visit | Attending: Vascular Surgery | Admitting: Vascular Surgery

## 2023-03-04 VITALS — BP 152/82 | HR 74 | Temp 98.0°F | Resp 20

## 2023-03-04 DIAGNOSIS — I739 Peripheral vascular disease, unspecified: Secondary | ICD-10-CM

## 2023-03-04 DIAGNOSIS — I70269 Atherosclerosis of native arteries of extremities with gangrene, unspecified extremity: Secondary | ICD-10-CM | POA: Diagnosis not present

## 2023-03-04 LAB — VAS US ABI WITH/WO TBI: Left ABI: 1.1

## 2023-03-04 NOTE — Progress Notes (Signed)
[facility] [place] HISTORY AND PHYSICAL   History of Present Illness:  Patient is a 72 y.o. year old male who presents for evaluation of PAD with history of left common femoral artery to below the knee popliteal bypass with vein by Dr. Edilia Bo on 06/23/2022. 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 He subsequently underwent great toe amputation by podiatry. He had some trouble healing his groin incision however did not require further operative intervention.   He states all incisions are well-healed. He has lifestyle limited by lumbar pain.  He denies frank claudication, rest pain, or new non healing wounds.  Medical management on ASA and Statin Daily.  His wife is with him today.  He is really having a hard time with his back pain more than anything else.   Past Medical History:  Diagnosis Date   Adenomatous polyps    Anxiety    Arthritis    Back pain    Barrett's esophagus    Coronary artery disease    Depression    Diabetes mellitus without complication (HCC)    Failure to thrive in adult    Gastroparesis    GERD (gastroesophageal reflux disease)    Hyperlipidemia    Hypertension    Lumbar disc disease    Lumbar spinal stenosis    Myocardial infarction [facility]) 1997   Thoracic myelopathy 2020    Past Surgical History:  Procedure Laterality Date   ABDOMINAL AORTOGRAM W/LOWER EXTREMITY Left 05/15/2022   Procedure: ABDOMINAL AORTOGRAM W/LOWER EXTREMITY;  Surgeon: Chuck Hint, MD;  Location: [facility] INVAS[facility];  Service: Cardiovascular;  Laterality: Left;   AMPUTATION TOE Left 08/21/2022   Procedure: AMPUTATION TOE INTERPHALANGEAL JOINT BIG TOE;  Surgeon: Vivi Barrack, DPM;  [facility];  Service: Podiatry;  Laterality: Left;   BACK SURGERY     microdiskectomy x2   BIOPSY  06/21/2018   Procedure: BIOPSY;  Surgeon: Charlott Rakes, MD;  [facility];  Service: Endoscopy;;   BIOPSY  02/06/2020   Procedure: BIOPSY;  Surgeon:  Charlott Rakes, MD;  [facility];  Service: Endoscopy;;   CATARACT EXTRACTION W/ INTRAOCULAR LENS IMPLANT Bilateral    COLONOSCOPY  07/12/2012   Procedure: COLONOSCOPY;  Surgeon: Shirley Friar, MD;  [facility];  Service: Endoscopy;  Laterality: N/A;   COLONOSCOPY WITH PROPOFOL N/A 06/21/2018   Procedure: COLONOSCOPY WITH PROPOFOL;  Surgeon: Charlott Rakes, MD;  [facility];  Service: Endoscopy;  Laterality: N/A;   COLONOSCOPY WITH PROPOFOL N/A 02/06/2020   Procedure: COLONOSCOPY WITH PROPOFOL;  Surgeon: Charlott Rakes, MD;  [facility];  Service: Endoscopy;  Laterality: N/A;   CORONARY ULTRASOUND/IVUS N/A 08/24/2018   Procedure: Intravascular Ultrasound/IVUS;  Surgeon: Lyn Records, MD;  Location: [facility] INVASIVE CV LAB;  Service: Cardiovascular;  Laterality: N/A;   ESOPHAGEAL MANOMETRY N/A 11/15/2017   Procedure: ESOPHAGEAL MANOMETRY (EM);  Surgeon: Charlott Rakes, MD;  [facility];  Service: Endoscopy;  Laterality: N/A;   ESOPHAGEAL MANOMETRY  01/29/2021   Procedure: ESOPHAGEAL MANOMETRY (EM);  Surgeon: Iva Boop, MD;  [facility];  Service: Endoscopy;;   ESOPHAGOGASTRODUODENOSCOPY  07/12/2012   Procedure: ESOPHAGOGASTRODUODENOSCOPY (EGD);  Surgeon: Shirley Friar, MD;  Location: [facility];  Service: Endoscopy;  Laterality: N/A;   ESOPHAGOGASTRODUODENOSCOPY (EGD) WITH PROPOFOL N/A 06/21/2018   Procedure: ESOPHAGOGASTRODUODENOSCOPY (EGD) WITH PROPOFOL;  Surgeon: Charlott Rakes, MD;  [facility];  Service: Endoscopy;  Laterality: N/A;   ESOPHAGOGASTRODUODENOSCOPY (EGD) WITH PROPOFOL N/A  02/06/2020   Procedure: ESOPHAGOGASTRODUODENOSCOPY (EGD) WITH PROPOFOL;  Surgeon: Charlott Rakes, MD;  [facility];  Service: Endoscopy;  Laterality: N/A;   FEMORAL-TIBIAL BYPASS GRAFT Left 06/23/2022   Procedure: BYPASS GRAFT FEMORAL-TIBIAL ARTERY;  Surgeon: Chuck Hint, MD;   Location: [facility] OR;  Service: Vascular;  Laterality: Left;   FOREIGN BODY RETRIEVAL N/A 02/06/2020   Procedure: FOREIGN BODY RETRIEVAL;  Surgeon: Charlott Rakes, MD;  [facility];  Service: Endoscopy;  Laterality: N/A;   KNEE ARTHROSCOPY  09/21/1986   Left   LEFT HEART CATH AND CORONARY ANGIOGRAPHY N/A 08/24/2018   Procedure: LEFT HEART CATH AND CORONARY ANGIOGRAPHY;  Surgeon: Lyn Records, MD;  Location: [facility];  Service: Cardiovascular;  Laterality: N/A;   POLYPECTOMY  06/21/2018   Procedure: POLYPECTOMY;  Surgeon: Charlott Rakes, MD;  [facility];  Service: Endoscopy;;   POLYPECTOMY  02/06/2020   Procedure: POLYPECTOMY;  Surgeon: Charlott Rakes, MD;  [facility];  Service: Endoscopy;;   SPINAL CORD STIMULATOR IMPLANT     SPINAL CORD STIMULATOR REMOVAL     SPINAL FUSION     x 3   THORACIC DISCECTOMY N/A 03/15/2019   Procedure: Laminectomy and Foraminotomy - Thoracic eleven -twelve;  Surgeon: Donalee Citrin, MD;  Location: [facility] OR;  Service: Neurosurgery;  Laterality: N/A;    ROS:   General:  No weight loss, Fever, chills  HEENT: No recent headaches, no nasal bleeding, no visual changes, no sore throat  Neurologic: No dizziness, blackouts, seizures. No recent symptoms of stroke or mini- stroke. No recent episodes of slurred speech, or temporary blindness.  Cardiac: No recent episodes of chest pain/pressure, no shortness of breath at rest.  No shortness of breath with exertion.  Denies history of atrial fibrillation or irregular heartbeat  Vascular: No history of rest pain in feet.  No history of claudication.  No history of non-healing ulcer, No history of DVT   Pulmonary: No home oxygen, no productive cough, no hemoptysis,  No asthma or wheezing  Musculoskeletal:  [ ]  Arthritis, [ ]  Low back pain,  [ ]  Joint pain  Hematologic:No history of hypercoagulable state.  No history of easy bleeding.  No history of anemia  Gastrointestinal:  No hematochezia or melena,  No gastroesophageal reflux, no trouble swallowing  Urinary: [ ]  chronic Kidney disease, [ ]  on HD - [ ]  MWF or [ ]  TTHS, [ ]  Burning with urination, [ ]  Frequent urination, [ ]  Difficulty urinating;   Skin: No rashes  Psychological: No history of anxiety,  No history of depression  Social History Social History   Tobacco Use   Smoking status: Former    Packs/day: 1.00    Years: 20.00    Additional pack years: 0.00    Total pack years: 20.00    Types: Cigarettes    Quit date: 04/09/2012    Years since quitting: 10.9   Smokeless tobacco: Never   Tobacco comments:    smoking THC for pain/appetite   Vaping Use   Vaping Use: Never used  Substance Use Topics   Alcohol use: Yes    Comment: Maybe 1 glass of wine once a month, socially   Drug use: Yes    Types: Marijuana    Comment: lunch and bedtime, occasionally, just to have an appetite. Not every day per pt    Family History Family History  Problem Relation Age of Onset   Breast cancer Mother    Lung cancer Father  Liver disease Brother    Colon cancer Neg Hx     Allergies  Allergies  Allergen Reactions   Humibid La [Guaifenesin] Other (See Comments)    hallucinations     Current Outpatient Medications  Medication Sig Dispense Refill   aspirin EC 81 MG tablet Take 1 tablet (81 mg total) by mouth daily. 90 tablet 3   carboxymethylcellulose (REFRESH PLUS) 0.5 % SOLN Place 1 drop into both eyes 3 (three) times daily as needed (dry eyes).     docusate sodium (COLACE) 100 MG capsule Take 1 capsule (100 mg total) by mouth every 12 (twelve) hours. 180 capsule 0   glimepiride (AMARYL) 4 MG tablet Take 2 tablets (8 mg total) by mouth daily. 180 tablet 1   glucose blood (FREESTYLE LITE) test strip Use to check blood glucose 2 times daily 200 strip 4   glucose blood test strip use 1 strip to check blood glucose 2 (two) times daily. 200 each 3   HYDROcodone-acetaminophen (NORCO/VICODIN) 5-325 MG  tablet Take 1-2 tablets by mouth every 6 (six) hours as needed. 15 tablet 0   ibuprofen (ADVIL,MOTRIN) 200 MG tablet Take 600-800 mg by mouth every 8 (eight) hours as needed for moderate pain.      insulin degludec (TRESIBA FLEXTOUCH) 100 UNIT/ML FlexTouch Pen Inject 10 Units into the skin every evening. 9 mL 3   Insulin Pen Needle (BD PEN NEEDLE NANO 2ND GEN) 32G X 4 MM MISC Use as directed once a day 100 each 6   Insulin Pen Needle (TECHLITE PEN NEEDLES) 32G X 4 MM MISC Use daily as directed 50 each 6   lidocaine (LIDODERM) 5 % Apply 2 patches to affected area daily for up to 12 hours.  Remove for 12 hours & repeat as directed. (Patient taking differently: Place 1 patch onto the skin daily as needed (pain).) 60 patch 11   metFORMIN (GLUCOPHAGE-XR) 500 MG 24 hr tablet Take 1 tablet (500 mg total) by mouth 2 (two) times daily. 60 tablet 6   metoCLOPramide (REGLAN) 5 MG tablet Take 1 tablet (5 mg total) by mouth 2 (two) times daily before a meal as needed. 60 tablet 0   metoprolol succinate (TOPROL-XL) 25 MG 24 hr tablet Take 1 tablet (25 mg total) by mouth daily. Must keep upcoming appointment for refills. 90 tablet 0   mirtazapine (REMERON) 15 MG tablet Take 1 tablet (15 mg total) by mouth at bedtime. 90 tablet 3   pantoprazole (PROTONIX) 40 MG tablet Take 1 tablet (40 mg total) by mouth 2 (two) times daily. 180 tablet 0   polyethylene glycol powder (MIRALAX) 17 GM/SCOOP powder Take 255 g by mouth daily as needed for moderate constipation or severe constipation. 510 g 0   promethazine (PHENERGAN) 25 MG tablet Take 1 tablet (25 mg total) by mouth every 8 (eight) hours as needed for nausea or vomiting. 20 tablet 0   rosuvastatin (CRESTOR) 5 MG tablet Take 1 tablet (5 mg total) by mouth daily. 30 tablet 11   sertraline (ZOLOFT) 50 MG tablet Take 1 tablet (50 mg total) by mouth daily. 30 tablet 0   sitaGLIPtin (JANUVIA) 100 MG tablet Take 1 tablet (100 mg total) by mouth daily. 90 tablet 4   sodium  phosphate (FLEET) 7-19 GM/118ML ENEM Place 133 mLs (1 enema total) rectally daily as needed for severe constipation. 233 mL 0   traMADol (ULTRAM) 50 MG tablet Take 50 mg by mouth every 6 (six) hours as needed for moderate  pain.     VITAMIN D PO Take 1 tablet by mouth daily.     nitroGLYCERIN (NITROSTAT) 0.4 MG SL tablet Place 1 tablet (0.4 mg total) under the tongue every 5 (five) minutes as needed for chest pain. If no relief after 3 doses call 911 25 tablet 3   No current facility-administered medications for this visit.    Physical Examination  Vitals:   03/04/23 1414  BP: (!) 152/82  Pulse: 74  Resp: 20  Temp: 98 F (36.7 C)  SpO2: 98%    There is no height or weight on file to calculate BMI.  General:  Alert and oriented, no acute distress HEENT: Normal Neck: No bruit or JVD Pulmonary: Clear to auscultation bilaterally Cardiac: Regular Rate and Rhythm without murmur Abdomen: Soft, non-tender, non-distended, no mass, no scars Skin: No rash Extremity : Skin warm without ischemic changes Musculoskeletal: No deformity or edema  Neurologic: Upper and lower extremity motor grossly equal B  and symmetric  DATA:  ABI Findings:  +---------+------------------+-----+----------+--------+  Right   Rt Pressure (mmHg)IndexWaveform  Comment   +---------+------------------+-----+----------+--------+  Brachial 108                                        +---------+------------------+-----+----------+--------+  PTA     111               0.97 monophasic          +---------+------------------+-----+----------+--------+  DP      93                0.82 monophasic          +---------+------------------+-----+----------+--------+  Great Toe72                0.63 Abnormal            +---------+------------------+-----+----------+--------+   +---------+------------------+-----+----------+----------------------------  ----+  Left    Lt Pressure  (mmHg)IndexWaveform  Comment                            +---------+------------------+-----+----------+----------------------------  ----+  Brachial 114                                                                 +---------+------------------+-----+----------+----------------------------  ----+  PTA     125               1.10 monophasic                                   +---------+------------------+-----+----------+----------------------------  ----+  DP      96                0.84 monophasic                                   +---------+------------------+-----+----------+----------------------------  ----+  Great Toe                       Abnormal  Cuff would not  fit due to                                                    partial amputation.                +---------+------------------+-----+----------+----------------------------  ----+   +-------+-----------+----------------+------------+------------+  ABI/TBIToday's ABIToday's TBI     Previous ABIPrevious TBI  +-------+-----------+----------------+------------+------------+  Right 0.97       0.63            0.96        0.82          +-------+-----------+----------------+------------+------------+  Left  1.10       not able to calc0.90        0             +-------+-----------+----------------+------------+------------+     Arterial wall calcification precludes accurate ankle pressures and ABIs.  Bilateral ABIs appear increased. Right TBIs appear decreased.    Summary:  Right: Resting right ankle-brachial index is within normal range. The  right toe-brachial index is abnormal.   Left: Resting left ankle-brachial index is within normal range. The left  toe-brachial index is abnormal.   Left Graft #1: Left fem-pop artery bypass  +--------------------+--------+--------+----------+---------+                     PSV cm/sStenosisWaveform  Comments    +--------------------+--------+--------+----------+---------+  Inflow             73              triphasic            +--------------------+--------+--------+----------+---------+  Proximal Anastomosis71              biphasic             +--------------------+--------+--------+----------+---------+  Proximal Graft      87              biphasic             +--------------------+--------+--------+----------+---------+  Mid Graft           103             biphasic             +--------------------+--------+--------+----------+---------+  Distal Graft        98              biphasic             +--------------------+--------+--------+----------+---------+  Distal Anastomosis  117             biphasic  turbulent  +--------------------+--------+--------+----------+---------+  Outflow            104             monophasic           +--------------------+--------+--------+----------+---------+      Summary:  Left: Patent fem-pop artery bypass graft without evidence of stenosis.   ASSESSMENT/PLAN:   PAD s/p left Fem-Pop bypass followed by GT amp by DPM. He ambulates with a walker secondary to lumbar pain.  He denies symptoms of ischemia.   He will stay as active as he is possible.  I will have him f/u for repeat surveillance in 6 months.    The bypass is patent without stenosis.  His ABI's are stable and unchanged.  No new  complaints of rest pain, claudication or non healing wounds. He is getting ready to star PT for back rehab.       Mosetta Pigeon PA-C [facility] [place] Office: [phone]  MD on Call [facility]

## 2023-03-05 LAB — VAS US ABI WITH/WO TBI: Right ABI: 0.97

## 2023-03-12 DIAGNOSIS — Z794 Long term (current) use of insulin: Secondary | ICD-10-CM | POA: Diagnosis not present

## 2023-03-12 DIAGNOSIS — M4804 Spinal stenosis, thoracic region: Secondary | ICD-10-CM | POA: Diagnosis not present

## 2023-03-12 DIAGNOSIS — F332 Major depressive disorder, recurrent severe without psychotic features: Secondary | ICD-10-CM | POA: Diagnosis not present

## 2023-03-12 DIAGNOSIS — I700 Atherosclerosis of aorta: Secondary | ICD-10-CM | POA: Diagnosis not present

## 2023-03-12 DIAGNOSIS — Z89412 Acquired absence of left great toe: Secondary | ICD-10-CM | POA: Diagnosis not present

## 2023-03-12 DIAGNOSIS — E1151 Type 2 diabetes mellitus with diabetic peripheral angiopathy without gangrene: Secondary | ICD-10-CM | POA: Diagnosis not present

## 2023-03-12 DIAGNOSIS — E1165 Type 2 diabetes mellitus with hyperglycemia: Secondary | ICD-10-CM | POA: Diagnosis not present

## 2023-03-12 DIAGNOSIS — K219 Gastro-esophageal reflux disease without esophagitis: Secondary | ICD-10-CM | POA: Diagnosis not present

## 2023-03-12 DIAGNOSIS — I2510 Atherosclerotic heart disease of native coronary artery without angina pectoris: Secondary | ICD-10-CM | POA: Diagnosis not present

## 2023-03-12 DIAGNOSIS — Z0000 Encounter for general adult medical examination without abnormal findings: Secondary | ICD-10-CM | POA: Diagnosis not present

## 2023-03-12 DIAGNOSIS — Z9181 History of falling: Secondary | ICD-10-CM | POA: Diagnosis not present

## 2023-03-15 ENCOUNTER — Ambulatory Visit: Payer: PPO | Admitting: Podiatry

## 2023-03-15 DIAGNOSIS — B351 Tinea unguium: Secondary | ICD-10-CM

## 2023-03-15 DIAGNOSIS — Z89419 Acquired absence of unspecified great toe: Secondary | ICD-10-CM

## 2023-03-15 NOTE — Patient Instructions (Signed)
If you use over the counter medication for the nails you can use "fungi-nail".

## 2023-03-16 ENCOUNTER — Other Ambulatory Visit (HOSPITAL_COMMUNITY): Payer: Self-pay

## 2023-03-16 DIAGNOSIS — N401 Enlarged prostate with lower urinary tract symptoms: Secondary | ICD-10-CM | POA: Diagnosis not present

## 2023-03-16 DIAGNOSIS — N50811 Right testicular pain: Secondary | ICD-10-CM | POA: Diagnosis not present

## 2023-03-16 DIAGNOSIS — N3941 Urge incontinence: Secondary | ICD-10-CM | POA: Diagnosis not present

## 2023-03-16 MED ORDER — SILDENAFIL CITRATE 20 MG PO TABS
ORAL_TABLET | ORAL | 1 refills | Status: DC
  Filled 2023-03-16: qty 90, 30d supply, fill #0
  Filled 2023-06-08 – 2023-06-11 (×2): qty 90, 30d supply, fill #1

## 2023-03-17 ENCOUNTER — Other Ambulatory Visit (HOSPITAL_COMMUNITY): Payer: Self-pay

## 2023-03-17 ENCOUNTER — Other Ambulatory Visit: Payer: Self-pay

## 2023-03-17 DIAGNOSIS — I739 Peripheral vascular disease, unspecified: Secondary | ICD-10-CM

## 2023-03-17 DIAGNOSIS — I70269 Atherosclerosis of native arteries of extremities with gangrene, unspecified extremity: Secondary | ICD-10-CM

## 2023-03-17 NOTE — Progress Notes (Signed)
Subjective:  Chief Complaint  Patient presents with   Foot Problem    Pt is here for 4 month follow up of left toe       Justin Key is a 72 y.o. is seen today in office s/p left partial hallux amputation preformed on 08/21/2022.  States that he is doing well and the incision is healed.  He has not had any issues.  His wife is asking about nail fungus.  No open lesions.  Objective: General: No acute distress, AAOx3  Foot appears to be well-perfused. Sensation decreased Left foot: Incision appears to be coapted and appears to be healed and scar is well-formed.  No edema, erythema.  No open lesions. The nails are mildly dystrophic with yellow, brown discoloration.  No edema, erythema of the toenail sites. No pain with calf compression, swelling, warmth, erythema.    Assessment and Plan:  Status post left partial hallux amputation, doing well with no complications ; onychomycosis  -Treatment options discussed including all alternatives, risks, and complications -Incision well-healed.  No new ulcerations.  Discussed daily foot inspection -Discussed treatment options for nail fungus.  Can start with over-the-counter medication. -Daily foot inspection  Return in about 1 year (around 03/14/2024) for foot exam.  Vivi Barrack DPM

## 2023-03-18 ENCOUNTER — Other Ambulatory Visit (HOSPITAL_COMMUNITY): Payer: Self-pay

## 2023-03-18 MED ORDER — CEPHALEXIN 500 MG PO CAPS
500.0000 mg | ORAL_CAPSULE | Freq: Two times a day (BID) | ORAL | 0 refills | Status: DC
  Filled 2023-03-18: qty 14, 7d supply, fill #0

## 2023-03-24 ENCOUNTER — Other Ambulatory Visit (HOSPITAL_COMMUNITY): Payer: Self-pay

## 2023-03-24 MED ORDER — GLIMEPIRIDE 4 MG PO TABS
8.0000 mg | ORAL_TABLET | Freq: Every day | ORAL | 1 refills | Status: DC
  Filled 2023-03-24: qty 180, 90d supply, fill #0
  Filled 2023-06-29: qty 180, 90d supply, fill #1

## 2023-03-27 ENCOUNTER — Other Ambulatory Visit (HOSPITAL_COMMUNITY): Payer: Self-pay

## 2023-03-31 ENCOUNTER — Other Ambulatory Visit (HOSPITAL_COMMUNITY): Payer: Self-pay

## 2023-04-02 ENCOUNTER — Other Ambulatory Visit (HOSPITAL_COMMUNITY): Payer: Self-pay

## 2023-04-02 MED ORDER — SERTRALINE HCL 100 MG PO TABS
100.0000 mg | ORAL_TABLET | Freq: Every day | ORAL | 3 refills | Status: DC
  Filled 2023-04-02: qty 90, 90d supply, fill #0
  Filled 2023-07-30: qty 90, 90d supply, fill #1

## 2023-04-06 ENCOUNTER — Other Ambulatory Visit (HOSPITAL_COMMUNITY): Payer: Self-pay

## 2023-04-16 ENCOUNTER — Other Ambulatory Visit: Payer: Self-pay | Admitting: Internal Medicine

## 2023-04-16 DIAGNOSIS — R918 Other nonspecific abnormal finding of lung field: Secondary | ICD-10-CM

## 2023-04-24 ENCOUNTER — Other Ambulatory Visit (HOSPITAL_COMMUNITY): Payer: Self-pay

## 2023-04-26 ENCOUNTER — Ambulatory Visit (INDEPENDENT_AMBULATORY_CARE_PROVIDER_SITE_OTHER): Payer: PPO | Admitting: Cardiovascular Disease

## 2023-04-26 ENCOUNTER — Other Ambulatory Visit (HOSPITAL_COMMUNITY): Payer: Self-pay

## 2023-04-26 ENCOUNTER — Encounter (HOSPITAL_BASED_OUTPATIENT_CLINIC_OR_DEPARTMENT_OTHER): Payer: Self-pay | Admitting: Cardiovascular Disease

## 2023-04-26 VITALS — BP 130/96 | HR 73 | Ht 75.0 in | Wt 179.3 lb

## 2023-04-26 DIAGNOSIS — E1169 Type 2 diabetes mellitus with other specified complication: Secondary | ICD-10-CM

## 2023-04-26 DIAGNOSIS — Z7984 Long term (current) use of oral hypoglycemic drugs: Secondary | ICD-10-CM

## 2023-04-26 DIAGNOSIS — I152 Hypertension secondary to endocrine disorders: Secondary | ICD-10-CM

## 2023-04-26 DIAGNOSIS — I25118 Atherosclerotic heart disease of native coronary artery with other forms of angina pectoris: Secondary | ICD-10-CM | POA: Diagnosis not present

## 2023-04-26 DIAGNOSIS — E1159 Type 2 diabetes mellitus with other circulatory complications: Secondary | ICD-10-CM | POA: Diagnosis not present

## 2023-04-26 DIAGNOSIS — E785 Hyperlipidemia, unspecified: Secondary | ICD-10-CM | POA: Diagnosis not present

## 2023-04-26 DIAGNOSIS — Z794 Long term (current) use of insulin: Secondary | ICD-10-CM | POA: Diagnosis not present

## 2023-04-26 DIAGNOSIS — I209 Angina pectoris, unspecified: Secondary | ICD-10-CM

## 2023-04-26 DIAGNOSIS — I739 Peripheral vascular disease, unspecified: Secondary | ICD-10-CM

## 2023-04-26 MED ORDER — METOPROLOL SUCCINATE ER 50 MG PO TB24
50.0000 mg | ORAL_TABLET | Freq: Every day | ORAL | 3 refills | Status: DC
  Filled 2023-04-26: qty 90, 90d supply, fill #0
  Filled 2023-10-27: qty 90, 90d supply, fill #1

## 2023-04-26 NOTE — Progress Notes (Signed)
Cardiology Office Note:  .    Date:  04/26/2023  ID:  Justin Key, DOB 05/23/1951, MRN [id number] PCP: [name], MD  [facility] Providers Cardiologist:  Lesleigh Noe, MD (Inactive)     History of Present Illness: .    Justin Key is a 72 y.o. male with moderate to severe multivessel CAD, MI s/p angioplasty 1997, peripheral vascular disease, hypertension, hyperlipidemia, type 2 diabetes, gastroparesis, GERD, chronic pain syndrome related to lumbar disc disease, who presents for follow-up and to establish care with me. He is a former patient of Dr. Katrinka Blazing, last seen by him 12/2021. LDL was extremely elevated, and he had prior heart cath in 2019 showing moderate CAD. They discussed bempedoic acid, PCSK9 therapy, Leqvio, as well as referral to the lipid clinic. However, he declined therapy given poor experiences with statin therapy remotely. He had absent pedal pulses on exam. Lower extremity duplex revealed total occlusion of the left superficial femoral artery that was reconstituted by collaterals. On 04/28/2022 he called EMS for sharp chest pain, nausea, and emesis. Was given 1 sublingual nitroglycerin that did not help, given aspirin and IV Zofran with improvement. Troponins 29 and 46, EKG was nonischemic. Cardiology recommended conservative management.   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 He was admitted on 06/23/22 and underwent left common femoral artery to below-knee popliteal artery bypass with nonreversed translocated saphenous vein graft by Dr. Edilia Bo. He later underwent left great toe amputation to the interphalangeal joint 08/21/2022.  Today, he is accompanied by his wife. He is not feeling well. For the last 4 days he has struggled with nausea and vomiting. He has not been able to sleep well. He has had 6 back surgeries, and is participating in physical therapy. He continues to experience significant back pain and mild anginal symptoms during PT.  However, he notes that his angina predates physical therapy. He also complains of central and left chest pain that may occur at rest; this is not a sharp pain. No associated shortness of breath or diaphoresis. In the past he has been unable to tolerate nitroglycerin due to headaches. Lately he has had some falls and once fell on his shoulder. He complains of mild leg pain today. Typically he is not able to walk very much. Occasionally he may have swelling in his ankles but no significant swelling lately. He denies orthopnea. He confirms compliance with his medications daily. He denies any palpitations, lightheadedness, headaches, syncope, or PND.  ROS:  Please see the history of present illness. All other systems are reviewed and negative.  (+) Nausea (+) Vomiting (+) Insomnia (+) Chronic back pain (+) Chest pain at rest and with exertion (+) Multiple falls (+) Occasional ankle swelling  Studies Reviewed: Marland Kitchen        LE Arterial Duplex Study  03/04/2023: Summary:  Left: Patent fem-pop artery bypass graft without evidence of stenosis.   ABI Dopplers  03/04/2023: Arterial wall calcification precludes accurate ankle pressures and ABIs.  Bilateral ABIs appear increased. Right TBIs appear decreased.    Summary:  Right: Resting right ankle-brachial index is within normal range. The  right toe-brachial index is abnormal.   Left: Resting left ankle-brachial index is within normal range. The left  toe-brachial index is abnormal.   EKG 04/26/23: Sinus arrhythmia.  Rate 73 bpm.  Risk Assessment/Calculations:     HYPERTENSION CONTROL Vitals:   04/26/23 1502 04/26/23 1614  BP: (!) 130/94 (!) 130/96    The  patient's blood pressure is elevated above target today.  In order to address the patient's elevated BP: A current anti-hypertensive medication was adjusted today.          Physical Exam:    VS:  BP (!) 130/96   Pulse 73   Ht 6\' 3"  (1.905 m)   Wt 179 lb 4.8 oz (81.3 kg)   BMI 22.41  kg/m  , BMI Body mass index is 22.41 kg/m. GENERAL: Ill-appearing HEENT: Pupils equal round and reactive, fundi not visualized, oral mucosa unremarkable NECK:  No jugular venous distention, waveform within normal limits, carotid upstroke brisk and symmetric, no bruits, no thyromegaly LUNGS:  Clear to auscultation bilaterally HEART:  RRR.  PMI not displaced or sustained,S1 and S2 within normal limits, no S3, no S4, no clicks, no rubs, no murmurs ABD:  Flat, positive bowel sounds normal in frequency in pitch, no bruits, no rebound, no guarding, no midline pulsatile mass, no hepatomegaly, no splenomegaly EXT:  2 plus pulses throughout, no edema, no cyanosis no clubbing SKIN:  No rashes no nodules NEURO:  Cranial nerves II through XII grossly intact, motor grossly intact throughout PSYCH:  Cognitively intact, oriented to person place and time  Wt Readings from Last 3 Encounters:  04/26/23 179 lb 4.8 oz (81.3 kg)  08/21/22 197 lb (89.4 kg)  08/20/22 197 lb (89.4 kg)     ASSESSMENT AND PLAN: .    Assessment and Plan    # Coronary Artery Disease Known severe, medically managed CAD. Last stress test in 2023 showed evidence of old myocardial infarction but no ischemia. Cardiac catheterization in 2019 showed significant disease in coronary arteries. Patient is reluctant to take nitroglycerin due to headache side effect.  They way he describes his current CP is atypical.  Discussed cath, repeat stress, Ranexa and increasing metoprolol.  He doesn't want any more medications.  Nitroglycerin causes headache.  -Increase Metoprolol from 25mg  to 50mg  daily. -Re-evaluate in one month.  # Hyperlipidemia LDL slightly above goal on Crestor 5mg .  He is very hesitant about medications.  Only one change today.  Will address at f/u.  Given his extensive disease, LDL should be <55. -Continue Crestor 5mg  daily.  # Peripheral Edema Reports occasional ankle swelling. -Continue current management.   #  PAD: No claudication.  Poor peripheral pulses.  Continues to follow up with vascular.      # Nausea/vomiting:  Supportive care.  Increase fluid intake.  Follow up with PCP if it persists.    Chronic Back Pain Reports severe pain, recent falls, and multiple past surgeries. -No changes to management discussed in this visit.             Dispo:  FU with APP in 2 months. FU with  C. Duke Salvia, MD, [facility] in 6 months.  I,Justin Key,acting as a Neurosurgeon for Chilton Si, MD.,have documented all relevant documentation on the behalf of Chilton Si, MD,as directed by  Chilton Si, MD while in the presence of Chilton Si, MD.  I,  C. Duke Salvia, MD have reviewed all documentation for this visit.  The documentation of the exam, diagnosis, procedures, and orders on 04/26/2023 are all accurate and complete.   Signed, Chilton Si, MD

## 2023-04-26 NOTE — Patient Instructions (Signed)
Medication Instructions:  INCREASE METOPROLOL TO 50 MG   *If you need a refill on your cardiac medications before your next appointment, please call your pharmacy*  Lab Work: NONE   Testing/Procedures: NONE  Follow-Up: At [facility], you and your health needs are our priority.  As part of our continuing mission to provide you with exceptional heart care, we have created designated Provider Care Teams.  These Care Teams include your primary Cardiologist (physician) and Advanced Practice Providers (APPs -  Physician Assistants and Nurse Practitioners) who all work together to provide you with the care you need, when you need it.  We recommend signing up for the patient portal called "MyChart".  Sign up information is provided on this After Visit Summary.  MyChart is used to connect with patients for Virtual Visits (Telemedicine).  Patients are able to view lab/test results, encounter notes, upcoming appointments, etc.  Non-urgent messages can be sent to your provider as well.   To learn more about what you can do with MyChart, go to [email or web address].    Your next appointment:   1 TO 2 month(s)  Provider:   Gillian Shields, NP

## 2023-04-27 ENCOUNTER — Other Ambulatory Visit (HOSPITAL_COMMUNITY): Payer: Self-pay

## 2023-04-28 ENCOUNTER — Other Ambulatory Visit (HOSPITAL_COMMUNITY): Payer: Self-pay

## 2023-04-28 MED ORDER — METOCLOPRAMIDE HCL 5 MG PO TABS
ORAL_TABLET | ORAL | 0 refills | Status: DC
  Filled 2023-04-28: qty 60, 30d supply, fill #0

## 2023-05-03 ENCOUNTER — Other Ambulatory Visit (HOSPITAL_COMMUNITY): Payer: Self-pay

## 2023-05-03 DIAGNOSIS — E1165 Type 2 diabetes mellitus with hyperglycemia: Secondary | ICD-10-CM | POA: Diagnosis not present

## 2023-05-03 DIAGNOSIS — I10 Essential (primary) hypertension: Secondary | ICD-10-CM | POA: Diagnosis not present

## 2023-05-03 MED ORDER — NOVOLOG FLEXPEN 100 UNIT/ML ~~LOC~~ SOPN
2.0000 [IU] | PEN_INJECTOR | Freq: Three times a day (TID) | SUBCUTANEOUS | 5 refills | Status: DC
  Filled 2023-05-03: qty 3, 20d supply, fill #0

## 2023-05-04 ENCOUNTER — Other Ambulatory Visit (HOSPITAL_COMMUNITY): Payer: Self-pay

## 2023-05-06 ENCOUNTER — Ambulatory Visit
Admission: RE | Admit: 2023-05-06 | Discharge: 2023-05-06 | Disposition: A | Discharge status: Home / Self Care | Payer: PPO | Source: Ambulatory Visit | Attending: Internal Medicine | Admitting: Internal Medicine

## 2023-05-06 DIAGNOSIS — R918 Other nonspecific abnormal finding of lung field: Secondary | ICD-10-CM

## 2023-05-06 DIAGNOSIS — R911 Solitary pulmonary nodule: Secondary | ICD-10-CM | POA: Diagnosis not present

## 2023-05-06 DIAGNOSIS — I700 Atherosclerosis of aorta: Secondary | ICD-10-CM | POA: Diagnosis not present

## 2023-05-07 ENCOUNTER — Other Ambulatory Visit (HOSPITAL_COMMUNITY): Payer: Self-pay

## 2023-05-07 DIAGNOSIS — N5201 Erectile dysfunction due to arterial insufficiency: Secondary | ICD-10-CM | POA: Diagnosis not present

## 2023-05-07 DIAGNOSIS — N401 Enlarged prostate with lower urinary tract symptoms: Secondary | ICD-10-CM | POA: Diagnosis not present

## 2023-05-07 DIAGNOSIS — R3915 Urgency of urination: Secondary | ICD-10-CM | POA: Diagnosis not present

## 2023-05-13 ENCOUNTER — Telehealth: Payer: Self-pay | Admitting: Internal Medicine

## 2023-05-13 NOTE — Telephone Encounter (Signed)
Attempted to contact patient. No answer. Voicemail left requesting return call.

## 2023-05-13 NOTE — Telephone Encounter (Signed)
CT completed 05/06/23, no result entered yet

## 2023-05-13 NOTE — Telephone Encounter (Signed)
These are highly specialized scans only a few radiologist can read and will call when report available

## 2023-05-13 NOTE — Telephone Encounter (Signed)
Patient wife calling in to get CT Results

## 2023-05-14 ENCOUNTER — Other Ambulatory Visit (HOSPITAL_COMMUNITY): Payer: Self-pay

## 2023-05-14 NOTE — Telephone Encounter (Signed)
Left message on VM x 2 for patient to call clinic regarding the report reading of CT scan per Dr. Sherene Sires.

## 2023-05-18 NOTE — Progress Notes (Signed)
Called patient and reviewed cxr results per Dr. Sherene Sires .  Will route to patients current cardiologist Chilton Si, MD at  [facility].  Will also route to PCP, [name], MD.  Both are [facility] providers.  Patient verbalized understanding and will reach out to cardiologist for follow up visit.

## 2023-05-19 NOTE — Telephone Encounter (Addendum)
Called patient will all information.  See result information with image encounter.

## 2023-05-29 ENCOUNTER — Other Ambulatory Visit (HOSPITAL_COMMUNITY): Payer: Self-pay

## 2023-06-03 ENCOUNTER — Other Ambulatory Visit: Payer: Self-pay

## 2023-06-04 ENCOUNTER — Other Ambulatory Visit (HOSPITAL_COMMUNITY): Payer: Self-pay

## 2023-06-11 ENCOUNTER — Other Ambulatory Visit (HOSPITAL_COMMUNITY): Payer: Self-pay

## 2023-06-12 ENCOUNTER — Other Ambulatory Visit (HOSPITAL_COMMUNITY): Payer: Self-pay

## 2023-06-17 ENCOUNTER — Other Ambulatory Visit (HOSPITAL_COMMUNITY): Payer: Self-pay

## 2023-06-17 ENCOUNTER — Ambulatory Visit (HOSPITAL_COMMUNITY)
Admission: EM | Admit: 2023-06-17 | Discharge: 2023-06-17 | Disposition: A | Discharge status: Other Acute Inpatient Hospital | Payer: PPO | Attending: Nurse Practitioner | Admitting: Nurse Practitioner

## 2023-06-17 DIAGNOSIS — R45851 Suicidal ideations: Secondary | ICD-10-CM | POA: Diagnosis not present

## 2023-06-17 DIAGNOSIS — R45850 Homicidal ideations: Secondary | ICD-10-CM | POA: Insufficient documentation

## 2023-06-17 DIAGNOSIS — F32A Depression, unspecified: Secondary | ICD-10-CM | POA: Insufficient documentation

## 2023-06-17 DIAGNOSIS — F3289 Other specified depressive episodes: Secondary | ICD-10-CM | POA: Diagnosis not present

## 2023-06-17 LAB — GLUCOSE, CAPILLARY: Glucose-Capillary: 106 mg/dL — ABNORMAL HIGH (ref 70–99)

## 2023-06-17 MED ORDER — ASPIRIN 81 MG PO CHEW: 324.0000 mg | CHEWABLE_TABLET | Freq: Once | ORAL | Status: DC

## 2023-06-17 MED ORDER — ASPIRIN 81 MG PO CHEW
CHEWABLE_TABLET | ORAL | Status: AC
  Administered 2023-06-17: 81 mg via ORAL
  Filled 2023-06-17: qty 1

## 2023-06-17 MED ORDER — NITROGLYCERIN 0.4 MG SL SUBL
0.4000 mg | SUBLINGUAL_TABLET | SUBLINGUAL | Status: DC | PRN
  Filled 2023-06-17: qty 1

## 2023-06-17 MED ORDER — ASPIRIN 81 MG PO CHEW: 81.0000 mg | CHEWABLE_TABLET | Freq: Once | ORAL | Status: AC

## 2023-06-17 NOTE — ED Notes (Signed)
Pt has c/o chest pains radiating to his back he was also observed holding left arm but denies left arm hurting b/p 146/79,  hr 74 NP Chinwendu was notified of pt symptoms and and vitals writer was instructed to give 0.4 mg of nitroglycerin pt refused nitroglycerin but did consent to EKG NP Chinwendu made aware of refusal

## 2023-06-17 NOTE — ED Notes (Signed)
Report given to [place] [email or web address]  [place]

## 2023-06-17 NOTE — ED Notes (Signed)
Wife notified of pt being  transferred to [facility]
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
## 2023-06-17 NOTE — Discharge Instructions (Signed)

## 2023-06-17 NOTE — Progress Notes (Signed)
   06/17/23 2104  [facility] Triage Screening (Walk-ins at [facility] only)  How Did You Hear About [place]? Family/Friend  What Is the Reason for Your Visit/Call Today? Pt presents to [facility] voluntarily, accompanied by his wife with complaint of depression and SI by "looking at his gun too often". Pt reports feelings of depression for many years. Pt reports lack of support and intimacy from his spouse as immediate stressors. Per wife, pt spoke with her via phone and stated that he didn't know if he would be here much longer. Pt denies HI,AVH.  Have You Recently Had Any Thoughts About Hurting Yourself? Yes  How long ago did you have thoughts about hurting yourself? currently  Are You Planning to Commit Suicide/Harm Yourself At This time? No  Have you Recently Had Thoughts About Hurting Someone [name]? No  Are You Planning To Harm Someone At This Time? No  Are you currently experiencing any auditory, visual or other hallucinations? No  Have You Used Any Alcohol or Drugs in the Past 24 Hours? Yes  How long ago did you use Drugs or Alcohol? yesterday  What Did You Use and How Much? cognac (2 drinks)  Do you have any current medical co-morbidities that require immediate attention? No  Clinician description of patient physical appearance/behavior: pt is cooperative, excessive talking and tearful at times  What Do You Feel Would Help You the Most Today? Treatment for Depression or other mood problem;Stress Management;Social Support  If access to [facility] was not available, would you have sought care in the Emergency Department? Yes  Determination of Need Urgent (48 hours)  Options For Referral Other: Comment;Outpatient Therapy;Medication Management;BH Urgent Care;Inpatient Hospitalization

## 2023-06-18 ENCOUNTER — Emergency Department (HOSPITAL_COMMUNITY): Payer: PPO

## 2023-06-18 ENCOUNTER — Other Ambulatory Visit: Payer: Self-pay

## 2023-06-18 ENCOUNTER — Emergency Department (HOSPITAL_COMMUNITY)
Admission: EM | Admit: 2023-06-18 | Discharge: 2023-06-21 | Disposition: A | Discharge status: Home / Self Care | Payer: PPO | Attending: Emergency Medicine | Admitting: Emergency Medicine

## 2023-06-18 DIAGNOSIS — R079 Chest pain, unspecified: Secondary | ICD-10-CM | POA: Diagnosis not present

## 2023-06-18 DIAGNOSIS — Z79899 Other long term (current) drug therapy: Secondary | ICD-10-CM | POA: Insufficient documentation

## 2023-06-18 DIAGNOSIS — I10 Essential (primary) hypertension: Secondary | ICD-10-CM | POA: Diagnosis not present

## 2023-06-18 DIAGNOSIS — F339 Major depressive disorder, recurrent, unspecified: Secondary | ICD-10-CM | POA: Diagnosis not present

## 2023-06-18 DIAGNOSIS — Z794 Long term (current) use of insulin: Secondary | ICD-10-CM | POA: Insufficient documentation

## 2023-06-18 DIAGNOSIS — R45850 Homicidal ideations: Secondary | ICD-10-CM

## 2023-06-18 DIAGNOSIS — R45851 Suicidal ideations: Secondary | ICD-10-CM | POA: Diagnosis not present

## 2023-06-18 DIAGNOSIS — I491 Atrial premature depolarization: Secondary | ICD-10-CM | POA: Diagnosis not present

## 2023-06-18 DIAGNOSIS — I499 Cardiac arrhythmia, unspecified: Secondary | ICD-10-CM | POA: Diagnosis not present

## 2023-06-18 DIAGNOSIS — I2510 Atherosclerotic heart disease of native coronary artery without angina pectoris: Secondary | ICD-10-CM | POA: Diagnosis not present

## 2023-06-18 DIAGNOSIS — F29 Unspecified psychosis not due to a substance or known physiological condition: Secondary | ICD-10-CM | POA: Diagnosis not present

## 2023-06-18 DIAGNOSIS — F32A Depression, unspecified: Secondary | ICD-10-CM | POA: Diagnosis present

## 2023-06-18 DIAGNOSIS — Z7982 Long term (current) use of aspirin: Secondary | ICD-10-CM | POA: Diagnosis not present

## 2023-06-18 DIAGNOSIS — F332 Major depressive disorder, recurrent severe without psychotic features: Secondary | ICD-10-CM | POA: Diagnosis not present

## 2023-06-18 DIAGNOSIS — R0789 Other chest pain: Secondary | ICD-10-CM | POA: Diagnosis not present

## 2023-06-18 LAB — CBC WITH DIFFERENTIAL/PLATELET
Abs Immature Granulocytes: 0.03 K/uL (ref 0.00–0.07)
Basophils Absolute: 0 K/uL (ref 0.0–0.1)
Basophils Relative: 1 %
Eosinophils Absolute: 0.1 K/uL (ref 0.0–0.5)
Eosinophils Relative: 1 %
HCT: 38.4 % — ABNORMAL LOW (ref 39.0–52.0)
Hemoglobin: 12.7 g/dL — ABNORMAL LOW (ref 13.0–17.0)
Immature Granulocytes: 0 %
Lymphocytes Relative: 15 %
Lymphs Abs: 1.3 K/uL (ref 0.7–4.0)
MCH: 28.2 pg (ref 26.0–34.0)
MCHC: 33.1 g/dL (ref 30.0–36.0)
MCV: 85.1 fL (ref 80.0–100.0)
Monocytes Absolute: 0.7 K/uL (ref 0.1–1.0)
Monocytes Relative: 8 %
Neutro Abs: 6.5 K/uL (ref 1.7–7.7)
Neutrophils Relative %: 75 %
Platelets: 209 K/uL (ref 150–400)
RBC: 4.51 MIL/uL (ref 4.22–5.81)
RDW: 14.9 % (ref 11.5–15.5)
WBC: 8.7 K/uL (ref 4.0–10.5)
nRBC: 0 % (ref 0.0–0.2)

## 2023-06-18 LAB — URINALYSIS, ROUTINE W REFLEX MICROSCOPIC
Bilirubin Urine: NEGATIVE
Glucose, UA: 150 mg/dL — AB
Hgb urine dipstick: NEGATIVE
Ketones, ur: 5 mg/dL — AB
Nitrite: NEGATIVE
Protein, ur: 30 mg/dL — AB
Specific Gravity, Urine: 1.011 (ref 1.005–1.030)
pH: 8 (ref 5.0–8.0)

## 2023-06-18 LAB — ETHANOL: Alcohol, Ethyl (B): <10 mg/dL (ref <10)

## 2023-06-18 LAB — COMPREHENSIVE METABOLIC PANEL
ALT: 18 U/L (ref 0–44)
AST: 19 U/L (ref 15–41)
Albumin: 3.5 g/dL (ref 3.5–5.0)
Alkaline Phosphatase: 48 U/L (ref 38–126)
Anion gap: 11 (ref 5–15)
BUN: 13 mg/dL (ref 8–23)
CO2: 22 mmol/L (ref 22–32)
Calcium: 9.3 mg/dL (ref 8.9–10.3)
Chloride: 104 mmol/L (ref 98–111)
Creatinine, Ser: 1.21 mg/dL (ref 0.61–1.24)
GFR, Estimated: >60 mL/min (ref >60)
Glucose, Bld: 115 mg/dL — ABNORMAL HIGH (ref 70–99)
Potassium: 3.9 mmol/L (ref 3.5–5.1)
Sodium: 137 mmol/L (ref 135–145)
Total Bilirubin: 0.7 mg/dL (ref 0.3–1.2)
Total Protein: 6.6 g/dL (ref 6.5–8.1)

## 2023-06-18 LAB — RAPID URINE DRUG SCREEN, HOSP PERFORMED
Amphetamines: NOT DETECTED
Barbiturates: NOT DETECTED
Benzodiazepines: NOT DETECTED
Cocaine: NOT DETECTED
Opiates: NOT DETECTED
Tetrahydrocannabinol: POSITIVE — AB

## 2023-06-18 LAB — TROPONIN I (HIGH SENSITIVITY)
Troponin I (High Sensitivity): 44 ng/L — ABNORMAL HIGH (ref <18)
Troponin I (High Sensitivity): 41 ng/L — ABNORMAL HIGH (ref <18)
Troponin I (High Sensitivity): 51 ng/L — ABNORMAL HIGH (ref <18)
Troponin I (High Sensitivity): 62 ng/L — ABNORMAL HIGH (ref <18)
Troponin I (High Sensitivity): 61 ng/L — ABNORMAL HIGH (ref <18)
Troponin I (High Sensitivity): 69 ng/L — ABNORMAL HIGH (ref <18)

## 2023-06-18 LAB — SALICYLATE LEVEL: Salicylate Lvl: <7 mg/dL — ABNORMAL LOW (ref 7.0–30.0)

## 2023-06-18 LAB — CBG MONITORING, ED
Glucose-Capillary: 194 mg/dL — ABNORMAL HIGH (ref 70–99)
Glucose-Capillary: 276 mg/dL — ABNORMAL HIGH (ref 70–99)
Glucose-Capillary: 187 mg/dL — ABNORMAL HIGH (ref 70–99)
Glucose-Capillary: 166 mg/dL — ABNORMAL HIGH (ref 70–99)

## 2023-06-18 LAB — ACETAMINOPHEN LEVEL: Acetaminophen (Tylenol), Serum: <10 ug/mL — ABNORMAL LOW (ref 10–30)

## 2023-06-18 LAB — HEMOGLOBIN A1C
Hgb A1c MFr Bld: 7.3 % — ABNORMAL HIGH (ref 4.8–5.6)
Mean Plasma Glucose: 162.81 mg/dL

## 2023-06-18 MED ORDER — INSULIN ASPART 100 UNIT/ML IJ SOLN: 0.0000 [IU] | Freq: Every day | INTRAMUSCULAR | Status: DC

## 2023-06-18 MED ORDER — ONDANSETRON HCL 4 MG/2ML IJ SOLN
INTRAMUSCULAR | Status: AC
  Filled 2023-06-18: qty 2

## 2023-06-18 MED ORDER — LABETALOL HCL 5 MG/ML IV SOLN
10.0000 mg | Freq: Once | INTRAVENOUS | Status: AC
  Administered 2023-06-18: 10 mg via INTRAVENOUS
  Filled 2023-06-18: qty 4

## 2023-06-18 MED ORDER — METFORMIN HCL ER 500 MG PO TB24
500.0000 mg | ORAL_TABLET | Freq: Two times a day (BID) | ORAL | Status: DC
  Administered 2023-06-18 – 2023-06-21 (×6): 500 mg via ORAL
  Filled 2023-06-18 (×8): qty 1

## 2023-06-18 MED ORDER — METOPROLOL SUCCINATE ER 25 MG PO TB24
50.0000 mg | ORAL_TABLET | Freq: Every day | ORAL | Status: DC
  Administered 2023-06-18 – 2023-06-20 (×3): 50 mg via ORAL
  Filled 2023-06-18 (×3): qty 2

## 2023-06-18 MED ORDER — INSULIN DEGLUDEC 100 UNIT/ML ~~LOC~~ SOPN: 10.0000 [IU] | PEN_INJECTOR | Freq: Every evening | SUBCUTANEOUS | Status: DC

## 2023-06-18 MED ORDER — SERTRALINE HCL 50 MG PO TABS: 50.0000 mg | ORAL_TABLET | Freq: Every day | ORAL | Status: DC

## 2023-06-18 MED ORDER — ONDANSETRON 4 MG PO TBDP
4.0000 mg | ORAL_TABLET | Freq: Three times a day (TID) | ORAL | Status: DC | PRN
  Administered 2023-06-18 – 2023-06-20 (×4): 4 mg via ORAL
  Filled 2023-06-18 (×4): qty 1

## 2023-06-18 MED ORDER — FLEET ENEMA 7-19 GM/118ML RE ENEM: 1.0000 | ENEMA | Freq: Every day | RECTAL | Status: DC | PRN

## 2023-06-18 MED ORDER — SERTRALINE HCL 100 MG PO TABS
100.0000 mg | ORAL_TABLET | Freq: Every day | ORAL | Status: DC
  Administered 2023-06-19 – 2023-06-20 (×2): 100 mg via ORAL
  Filled 2023-06-18 (×2): qty 1

## 2023-06-18 MED ORDER — LINAGLIPTIN 5 MG PO TABS
5.0000 mg | ORAL_TABLET | Freq: Every day | ORAL | Status: DC
  Administered 2023-06-19 – 2023-06-20 (×2): 5 mg via ORAL
  Filled 2023-06-18 (×4): qty 1

## 2023-06-18 MED ORDER — ROSUVASTATIN CALCIUM 5 MG PO TABS
5.0000 mg | ORAL_TABLET | Freq: Every day | ORAL | Status: DC
  Administered 2023-06-19 – 2023-06-20 (×2): 5 mg via ORAL
  Filled 2023-06-18 (×2): qty 1

## 2023-06-18 MED ORDER — IBUPROFEN 200 MG PO TABS
600.0000 mg | ORAL_TABLET | Freq: Three times a day (TID) | ORAL | Status: DC | PRN
  Administered 2023-06-18: 800 mg via ORAL
  Filled 2023-06-18: qty 4

## 2023-06-18 MED ORDER — PROMETHAZINE HCL 25 MG PO TABS: 25.0000 mg | ORAL_TABLET | Freq: Three times a day (TID) | ORAL | Status: DC | PRN

## 2023-06-18 MED ORDER — ONDANSETRON HCL 4 MG/2ML IJ SOLN
4.0000 mg | Freq: Once | INTRAMUSCULAR | Status: AC
  Administered 2023-06-18: 4 mg via INTRAVENOUS

## 2023-06-18 MED ORDER — ONDANSETRON HCL 4 MG/2ML IJ SOLN
4.0000 mg | Freq: Once | INTRAMUSCULAR | Status: AC
  Administered 2023-06-18: 4 mg via INTRAVENOUS
  Filled 2023-06-18: qty 2

## 2023-06-18 MED ORDER — ASPIRIN 81 MG PO TBEC
81.0000 mg | DELAYED_RELEASE_TABLET | Freq: Every day | ORAL | Status: DC
  Administered 2023-06-19 – 2023-06-20 (×2): 81 mg via ORAL
  Filled 2023-06-18 (×2): qty 1

## 2023-06-18 MED ORDER — INSULIN ASPART 100 UNIT/ML FLEXPEN: 2.0000 [IU] | PEN_INJECTOR | Freq: Three times a day (TID) | SUBCUTANEOUS | Status: DC

## 2023-06-18 MED ORDER — TRAMADOL HCL 50 MG PO TABS: 50.0000 mg | ORAL_TABLET | Freq: Four times a day (QID) | ORAL | Status: DC | PRN

## 2023-06-18 MED ORDER — INSULIN ASPART 100 UNIT/ML IJ SOLN
0.0000 [IU] | Freq: Three times a day (TID) | INTRAMUSCULAR | Status: DC
  Administered 2023-06-18: 2 [IU] via SUBCUTANEOUS
  Administered 2023-06-18: 5 [IU] via SUBCUTANEOUS
  Administered 2023-06-19: 2 [IU] via SUBCUTANEOUS
  Administered 2023-06-19: 3 [IU] via SUBCUTANEOUS
  Administered 2023-06-20: 2 [IU] via SUBCUTANEOUS
  Administered 2023-06-20: 3 [IU] via SUBCUTANEOUS
  Administered 2023-06-21: 1 [IU] via SUBCUTANEOUS

## 2023-06-18 MED ORDER — METOPROLOL SUCCINATE ER 25 MG PO TB24: 50.0000 mg | ORAL_TABLET | Freq: Every day | ORAL | Status: DC

## 2023-06-18 MED ORDER — PANTOPRAZOLE SODIUM 40 MG PO TBEC
40.0000 mg | DELAYED_RELEASE_TABLET | Freq: Two times a day (BID) | ORAL | Status: DC
  Administered 2023-06-18 – 2023-06-20 (×5): 40 mg via ORAL
  Filled 2023-06-18 (×5): qty 1

## 2023-06-18 MED ORDER — ONDANSETRON 4 MG PO TBDP
4.0000 mg | ORAL_TABLET | Freq: Once | ORAL | Status: AC
  Administered 2023-06-18: 4 mg via ORAL
  Filled 2023-06-18: qty 1

## 2023-06-18 MED ORDER — POLYETHYLENE GLYCOL 3350 17 G PO PACK
1.0000 | PACK | Freq: Every day | ORAL | Status: DC | PRN
  Administered 2023-06-20: 17 g via ORAL
  Filled 2023-06-18: qty 1

## 2023-06-18 MED ORDER — DOCUSATE SODIUM 100 MG PO CAPS
100.0000 mg | ORAL_CAPSULE | Freq: Two times a day (BID) | ORAL | Status: DC
  Administered 2023-06-18 – 2023-06-21 (×6): 100 mg via ORAL
  Filled 2023-06-18 (×6): qty 1

## 2023-06-18 MED ORDER — GLIMEPIRIDE 4 MG PO TABS
8.0000 mg | ORAL_TABLET | Freq: Every day | ORAL | Status: DC
  Administered 2023-06-19 – 2023-06-20 (×2): 8 mg via ORAL
  Filled 2023-06-18 (×5): qty 2

## 2023-06-18 NOTE — ED Triage Notes (Signed)
Patient arrived with EMS from [facility] with [employer] officers , central chest pain this evening radiating to left arm , denies SOB , received ASA 324 mg prior to arrival , CBG= 106. IVC due to suicidal ideations . History of CAD/MI ; depression .

## 2023-06-18 NOTE — Progress Notes (Signed)
LCSW Progress Note  [id number]   Justin Key  06/18/2023  10:15 AM  Description:   Inpatient Psychiatric Referral  Patient was recommended inpatient per Liborio Nixon, NP. Per provider note, pt's wife has requested for the patient not to be sent to [facility] psych treatment because pt worked over there for many years and would not want anyone familiar to see him. Patient was referred to the following out of network facilities:   [facility] Provider Address Phone Fax  [facility]  [address], [place] [address] [phone]  [facility]  [address], [place] [address] [phone]  [facility]  [address] [place], [place] [address] [phone]  [facility]  [address] [place] [address] [phone]  [facility]  [address] [place] [address] [phone]  [facility]  [address], [place] [address] [phone]  [facility]  [address] [place] [address] [phone]  [facility]  [place] [phone]  [facility]  [address] [place] [address] [phone]  [facility]  [address], [place] [address] [phone]  [facility] Inpatient EFAX  [place] [phone]    Situation ongoing, CSW to continue following and update chart as more information becomes available.    Cathie Beams, LCSW  06/18/2023 10:15 AM

## 2023-06-18 NOTE — BH Assessment (Addendum)
Comprehensive Clinical Assessment (CCA) Note  06/18/2023 Justin Key [id number]  Disposition: Rockney Ghee, NP, patient meets inpatient criteria after medical clearance. Per provider note, wife has requested for the patient not to be sent to [facility] psych for inpatient psychiatric hospitalization because he worked over there for many years and would not want anyone familiar to see him.   The patient demonstrates the following risk factors for suicide: Chronic risk factors for suicide include: psychiatric disorder of depression . Acute risk factors for suicide include: family or marital conflict. Protective factors for this patient include: responsibility to others (children, family). Considering these factors, the overall suicide risk at this point appears to be high. Patient is not appropriate for outpatient follow up.  Justin Key is a 72 year old male presenting as a voluntary walk-in to [facility] due to SI and HI. Patient denied psychosis and alcohol/drug usage. Patient is accompanied by his wife, [name]. Patient gave consent for [name] to be present during assessment. Patient was argumentative and uncooperative during assessment. Patient became uncooperative and belligerent during assessment with providers.   Patient reports feelings of depression since Feb 09, 1981. Patient continuous to blame wife on his depression, SI and HI. Patient reports "I have an M13030, automatic military World War II rifle with bullets at home, I got a couple of clips and there's 30 rounds in a clip". Patient reports worsening depressive symptoms of poor appetite and sleep, crying spells and lack of support and intimacy from wife.   Patient resides with wife of 46 years. 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 Patient has access to guns in the home. Patient was noncooperative and belligerent. Per provider note, wife has requested for the patient not to be sent to [facility] psych for inpatient  psychiatric hospitalization because he worked over there for many years and would not want anyone familiar to see him.   Chief Complaint:  Chief Complaint  Patient presents with   Chest pain / SI    IVC   Visit Diagnosis:  Major depressive disorder    CCA Screening, Triage and Referral (STR)  Patient Reported Information How did you hear about [place]? Family/Friend  What Is the Reason for Your Visit/Call Today? SI and owns gun.  How Long Has This Been Causing You Problems? 1 wk - 1 month  What Do You Feel Would Help You the Most Today? Treatment for Depression or other mood problem   Have You Recently Had Any Thoughts About Hurting Yourself? Yes  Are You Planning to Commit Suicide/Harm Yourself At This time? Yes   Flowsheet Row ED from 06/18/2023 in [facility] ED from [facility]17[facility] ED from 09/07/2022 in [facility])  C-SSRS RISK CATEGORY No Risk Low Risk No Risk       Have you Recently Had Thoughts About Hurting Someone [name]? No  Are You Planning to Harm Someone at This Time? No  Explanation: n/a   Have You Used Any Alcohol or Drugs in the Past 24 Hours? No  What Did You Use and How Much? n/a   Do You Currently Have a Therapist/Psychiatrist? No  Name of Therapist/Psychiatrist: Name of Therapist/Psychiatrist: n/a   Have You Been Recently Discharged From Any Office Practice or Programs? No  Explanation of Discharge From Practice/Program: n/a     CCA Screening Triage Referral Assessment Type of Contact: Face-to-Face  Telemedicine Service Delivery:  Is this Initial or Reassessment?   Date Telepsych consult ordered in [facility]:    Time Telepsych consult ordered in [facility]:    Location of Assessment: [facility] Assessment Services  Provider Location: [facility] Assessment Services   Collateral Involvement: Cristi Loron, wife   Does Patient Have  a Automotive engineer Guardian? No  Legal Guardian Contact Information: n/a  Copy of Legal Guardianship Form: -- (n/a)  Legal Guardian Notified of Arrival: -- (n/a)  Legal Guardian Notified of Pending Discharge: -- (n/a)  If Minor and Not Living with Parent(s), Who has Custody? n/a  Is CPS involved or ever been involved? Never  Is APS involved or ever been involved? Never   Patient Determined To Be At Risk for Harm To Self or Others Based on Review of Patient Reported Information or Presenting Complaint? Yes, for Self-Harm  Method: Plan with intent and identified person  Availability of Means: In hand or used  Intent: Clearly intends on inflicting harm that could cause death  Notification Required: Another person is identifiable and needs to be warned to ensure safety (DUTY TO WARN)  Additional Information for Danger to Others Potential: -- (n/a)  Additional Comments for Danger to Others Potential: patient has a gun  Are There Guns or Other Weapons in Your Home? Yes  Types of Guns/Weapons: Advice worker Secured?                            No  Who Could Verify You Are Able To Have These Secured: wife  Do You Have any Outstanding Charges, Pending Court Dates, Parole/Probation? none reported  Contacted To Inform of Risk of Harm To Self or Others: Family/Significant Other:    Does Patient Present under Involuntary Commitment? No    [place] of Residence: [place]   Patient Currently Receiving the Following Services: Not Receiving Services   Determination of Need: Emergent (2 hours)   Options For Referral: Inpatient Hospitalization; Medication Management; Outpatient Therapy     CCA Biopsychosocial Patient Reported Schizophrenia/Schizoaffective Diagnosis in Past: No   Strengths: unable to assess   Mental Health Symptoms Depression:   Irritability   Duration of Depressive symptoms:  Duration of Depressive Symptoms: Less  than two weeks   Mania:   None   Anxiety:    Tension; Irritability; Restlessness   Psychosis:   None   Duration of Psychotic symptoms:    Trauma:   -- (unable to assess)   Obsessions:   -- (unable to assess)   Compulsions:   -- (unable to assess)   Inattention:   -- (unable to assess)   Hyperactivity/Impulsivity:   -- (unable to assess)   Oppositional/Defiant Behaviors:   -- (unable to assess)   Emotional Irregularity:   -- (unable to assess)   Other Mood/Personality Symptoms:   unable to assess    Mental Status Exam Appearance and self-care  Stature:   Average   Weight:   Average weight   Clothing:   Casual   Grooming:   Normal   Cosmetic use:   None   Posture/gait:   Normal   Motor activity:   Not Remarkable   Sensorium  Attention:   Normal   Concentration:   Normal   Orientation:   -- (unable to assess)   Recall/memory:   -- (unable to assess)   Affect and Mood  Affect:   Negative   Mood:   Angry; Irritable  Relating  Eye contact:   Normal   Facial expression:   Angry; Tense   Attitude toward examiner:   Argumentative; Guarded; Irritable   Thought and Language  Speech flow:  Clear and Coherent   Thought content:   Appropriate to Mood and Circumstances   Preoccupation:   -- (unable to assess)   Hallucinations:   -- (unable to assess)   Organization:   Coherent   [employer] of Knowledge:   Average   Intelligence:   Average   Abstraction:   Normal   Judgement:   Dangerous   Reality Testing:   Distorted   Insight:   Poor   Decision Making:   Impulsive   Social Functioning  Social Maturity:   Impulsive   Social Judgement:   Heedless   Stress  Stressors:   Relationship   Coping Ability:   Human resources officer Deficits:   Communication   Supports:   Family     Religion: Religion/Spirituality Are You A Religious Person?:  (unable to assess) How Might This  Affect Treatment?: n/a  Leisure/Recreation: Leisure / Recreation Do You Have Hobbies?:  (unable to assess)  Exercise/Diet: Exercise/Diet Do You Exercise?:  (unable to assess) Have You Gained or Lost A Significant Amount of Weight in the Past Six Months?:  (unable to assess) Do You Follow a Special Diet?:  (unable to assess) Do You Have Any Trouble Sleeping?:  (unable to assess)   CCA Employment/Education Employment/Work Situation: Employment / Work Situation Employment Situation: Retired Passenger transport manager has Been Impacted by Current Illness:  (n/a) Has Patient ever Been in Equities trader?: No  Education: Education Is Patient Currently Attending School?: No Last Grade Completed: 16 Did You Product manager?: Yes What Type of College Degree Do you Have?: 4 year degress Did You Have An Individualized Education Program (IIEP):  (unable to assess) Did You Have Any Difficulty At School?:  (unable to assess) Patient's Education Has Been Impacted by Current Illness:  (unable to assess)   CCA Family/Childhood History Family and Relationship History: Family history Marital status: Married Number of Years Married: 46 What types of issues is patient dealing with in the relationship?: ongoing Additional relationship information: unable to assess Does patient have children?:  (unable to assess)  Childhood History:  Childhood History By whom was/is the patient raised?:  (unable to assess) Did patient suffer any verbal/emotional/physical/sexual abuse as a child?:  (unable to assess) Did patient suffer from severe childhood neglect?:  (unable to assess) Has patient ever been sexually abused/assaulted/raped as an adolescent or adult?:  (unable to assess) Was the patient ever a victim of a crime or a disaster?:  (unable to assess) Witnessed domestic violence?:  (unable to assess) Has patient been affected by domestic violence as an adult?:  (unable to assess)       CCA Substance  Use Alcohol/Drug Use: Alcohol / Drug Use Pain Medications: see MAR Prescriptions: see MAR Over the Counter: see MAR History of alcohol / drug use?: Yes Longest period of sobriety (when/how long): unable to assess Negative Consequences of Use:  (unable to assess) Withdrawal Symptoms:  (unable to assess)                         ASAM's:  Six Dimensions of Multidimensional Assessment  Dimension 1:  Acute Intoxication and/or Withdrawal Potential:   Dimension 1:  Description of individual's past and current experiences of substance use and withdrawal: n/a  Dimension  2:  Biomedical Conditions and Complications:   Dimension 2:  Description of patient's biomedical conditions and  complications: n/a  Dimension 3:  Emotional, Behavioral, or Cognitive Conditions and Complications:  Dimension 3:  Description of emotional, behavioral, or cognitive conditions and complications: n/a  Dimension 4:  Readiness to Change:  Dimension 4:  Description of Readiness to Change criteria: n/a  Dimension 5:  Relapse, Continued use, or Continued Problem Potential:  Dimension 5:  Relapse, continued use, or continued problem potential critiera description: n/a  Dimension 6:  Recovery/Living Environment:  Dimension 6:  Recovery/Iiving environment criteria description: n/a  ASAM Severity Score:    ASAM Recommended Level of Treatment: ASAM Recommended Level of Treatment:  (n/a)   Substance use Disorder (SUD) Substance Use Disorder (SUD)  Checklist Symptoms of Substance Use:  (n/a)  Recommendations for Services/Supports/Treatments: Recommendations for Services/Supports/Treatments Recommendations For Services/Supports/Treatments: Inpatient Hospitalization, Individual Therapy, Medication Management  Discharge Disposition:    DSM5 Diagnoses: Patient Active Problem List   Diagnosis Date Noted   Myofascial pain 07/16/2022   Inflammation of sacroiliac joint (HCC) 07/16/2022   Malnutrition of moderate  degree 06/26/2022   PAD (peripheral artery disease) (HCC) 06/23/2022   Chest pain 04/28/2022   Insulin dependent type 2 diabetes mellitus (HCC) 04/28/2022   Hypertension associated with diabetes (HCC) 04/28/2022   Hyperlipidemia associated with type 2 diabetes mellitus (HCC) 04/28/2022   Renal insufficiency 04/28/2022   Peripheral artery disease (HCC) 04/28/2022   Gastroparesis due to DM (HCC) 04/28/2022   Esophageal dysphagia    Chronic constipation 01/01/2021   Chronic nausea 01/01/2021   Urinary incontinence 01/01/2021   Chronic bilateral low back pain 01/01/2021   Esophagogastric junction outflow obstruction 01/01/2021   Personal history of multiple spine surgeries 01/01/2021   Chronic insomnia 01/01/2021   Anal sphincter incompetence 01/01/2021   Upper airway cough syndrome 06/15/2019   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   Spinal stenosis of thoracic region 12/27/2018   CAD (coronary artery disease), native coronary artery 08/24/2018   Angina pectoris (HCC) 08/24/2018   Tinea manuum, pedis, and unguium 08/22/2018   Tinea versicolor 08/22/2018   GERD (gastroesophageal reflux disease) 06/21/2018   Hx of adenomatous colonic polyps 06/21/2018   Pulmonary infiltrate present on computed tomography 10/12/2017   Thoracic spondylosis without myelopathy 06/24/2016   Degeneration of lumbosacral intervertebral disc 12/31/2015   Depression 05/31/2015   Displacement of lumbar intervertebral disc without myelopathy 08/25/2013   Postlaminectomy syndrome, lumbar region 06/26/2013   Enthesopathy of hip region 02/25/2013   Pain in thoracic spine 02/25/2013     Referrals to Alternative Service(s): Referred to Alternative Service(s):   Place:   Date:   Time:    Referred to Alternative Service(s):   Place:   Date:   Time:    Referred to Alternative Service(s):   Place:   Date:   Time:    Referred to Alternative Service(s):   Place:   Date:   Time:     Burnetta Sabin, [facility]

## 2023-06-18 NOTE — ED Notes (Signed)
Pt states no nausea at this time but does not want to take any oral meds

## 2023-06-18 NOTE — ED Notes (Signed)
Called court of clerk to retreive case #   [id number]

## 2023-06-18 NOTE — ED Notes (Signed)
Pt's wife taking all belongings except glasses and dentures.

## 2023-06-18 NOTE — Progress Notes (Signed)
Pt has been accepted to [facility] TOMORROW 06/19/2023  Pt meets inpatient criteria per Liborio Nixon, NP  Attending Physician will be Jilda Panda, MD  Report can be called to: [phone]  Pt can arrive after 8 AM  Care Team Notified: [facility], RN, Liborio Nixon, NP, and Gustavo Lah, RN  [place], [place]  06/18/2023 12:03 PM

## 2023-06-18 NOTE — Inpatient Diabetes Management (Signed)
Inpatient Diabetes Program Recommendations  AACE/ADA: New Consensus Statement on Inpatient Glycemic Control (2015)  Target Ranges:  Prepandial:   less than 140 mg/dL      Peak postprandial:   less than 180 mg/dL (1-2 hours)      Critically ill patients:  140 - 180 mg/dL   Lab Results  Component Value Date   GLUCAP 276 (H) 06/18/2023   HGBA1C 7.3 (H) 06/18/2023    Review of Glycemic Control  Latest Reference Range & Units 06/18/23 07:38 06/18/23 12:03  Glucose-Capillary 70 - 99 mg/dL 161 (H) 096 (H)   Diabetes history: DM  Outpatient Diabetes medications:  Amaryl 8 mg daily Novolog- does not use anymore Tresiba 10 units q PM Metformin 500 mg bid Januvia 100 mg daily Current orders for Inpatient glycemic control:  Novolog 0-9 units tid with meals and HS Amaryl 8 mg daily Metformin XR 500 mg bid Tradjenta 5 mg daily  Inpatient Diabetes Program Recommendations:    Agree with current orders.   Thanks,  Beryl Meager, RN, BC-ADM Inpatient Diabetes Coordinator Pager [phone]  (8a-5p)

## 2023-06-18 NOTE — ED Provider Notes (Signed)
Emergency Medicine Observation Re-evaluation Note  Justin Key is a 72 y.o. male, seen on rounds today.  Pt initially presented to the ED for complaints of Chest pain / SI (IVC) Currently, the patient is awaiting inpatient placement.  Physical Exam  BP (!) 154/102   Pulse 72   Temp 98 F (36.7 C) (Oral)   Resp 16   SpO2 98%  Physical Exam General: Calm Cardiac: Well perfused Lungs: Even respirations Psych: Calm  ED Course / MDM  EKG:EKG Interpretation Date/Time:  Friday June 18 2023 00:26:29 EDT Ventricular Rate:  78 PR Interval:  143 QRS Duration:  89 QT Interval:  398 QTC Calculation: 454 R Axis:   75  Text Interpretation: Sinus rhythm Atrial premature complex Probable left atrial enlargement Borderline T wave abnormalities ST elevation, consider anterior injury Confirmed by Kennis Carina [id number]) on 06/18/2023 1:58:38 AM  I have reviewed the labs performed to date as well as medications administered while in observation.  Recent changes in the last 24 hours include patient with elevated BP and vomiting this AM. Nursing came to me because he was continuing to vomiting. Symptoms improved with ODT zofran and BP improved with IV labetalol. CP is very atypical for ACS. Patient has been accepted for inpatient psychiatry with plan to transfer tomorrow. Will trend his troponin this afternoon. He has had mildly elevated labs in the past within this range. If not continuing to up-trend he will be clear for inpatient psychiatry in the AM. Home BP meds ordered.   Plan  Current plan is for inpatient psychiatry in the AM.    Arnulfo Batson, Arlyss Repress, MD 06/19/23 [phone]

## 2023-06-18 NOTE — ED Notes (Signed)
IVC PAPERWORK IS ON CLIP BOARD IN PURPLE 3 COPIES ATTACHED AND ORIGINAL IN RED FOLDER

## 2023-06-18 NOTE — ED Notes (Signed)
IVC PAPER WORK E FILED

## 2023-06-18 NOTE — Progress Notes (Signed)
LCSW Progress Note  [id number]   Justin Key  06/18/2023  8:30 PM    Inpatient Behavioral Health Placement  Pt meets inpatient criteria per [facility]. There are no available beds within [facility] system per [facility] Kim Brooks,RN. Referral was sent to the following facilities;   Destination  Service Provider Address Phone [facility]  [address], [place] [address] [phone]  [facility]  [address], [place] [address] [phone]  [facility]  [address] [place], [place] [address] [phone]  [facility]  [address] [place] [address] [phone]  [facility]  [address] [place] [address] [phone]  [facility]  [address] [place] [address] [phone]  [facility]  [address] [place] [address] [phone]  [facility]  [place] [phone]  [facility]  [address], [place] [address] [phone]  [facility]  [address], [place] [address] [phone]  [facility] Inpatient EFAX  [place] [phone]  [facility]  [address] [place] [address] [phone]  [facility]  [address] [place], [place] [address] [phone]  [facility] Patient Placement  [facility], [place] [phone]  [facility] [address]  [place] [address] [phone]  [facility]  [address] [place], [place] [address] [phone]  [facility]  [address] [place] [address] [phone]  [facility]  [address] [place] [address] [phone]  [facility]  [address] [place] [address] [phone]  [facility] EFAX  [address] [name] [phone]  [facility]  [address] [place] [address] [phone]  [facility]  [address], [place] [address] [phone]  [facility]  [address] [place] [address] [phone]  [facility]  [address] [place] [address] [phone]  [facility]  [address] [place] [address] [phone]    Situation ongoing,  CSW will follow up.    Maryjean Ka, MSW, LCSWA 06/18/2023 8:30 PM

## 2023-06-18 NOTE — ED Notes (Signed)
Pt given new blanket and sheets at this time

## 2023-06-18 NOTE — ED Provider Notes (Signed)
[place] EMERGENCY DEPARTMENT AT [facility] Provider Note   CSN: [id number] Arrival date & time: 06/18/23  0019     History  Chief Complaint  Patient presents with   Chest pain / SI    IVC    Justin Key is a 72 y.o. male.  The history is provided by the patient and medical records.   72 y.o. M with hx of CAD, PAD, depression, GERD, hypertension, hyperlipidemia, spinal stenosis, presenting to the ED from [facility] for medical evaluation.  Patient initially went to the behavioral health urgent care as he has been depressed.  He notes his primary factor for this is that "I have been married for 42 years and my wife never comes to me to make love".  He states he desires intimacy and because this is lacking, he does not feel like his wife loves him.  He also has some resentment towards her because early on in their relationship she was having an affair with a "heroin dealer" behind his back.  He admits he has firearms at home and has had thoughts about potentially using them for self-inflicted injury.  He has not acted on this.  He states he just feels incredibly depressed and sad.  He denies any homicidal ideation.  No hallucinations.  While at urgent care discussing these matters he began having chest pain.  Describes it as left-sided and somewhat into the left shoulder.  Described as a "pinprick" type pain.  He denies any shortness of breath, nausea, or vomiting.  He does have history of coronary artery disease with prior MI.  Home Medications Prior to Admission medications   Medication Sig Start Date End Date Taking? Authorizing Provider  aspirin EC 81 MG tablet Take 1 tablet (81 mg total) by mouth daily. 10/18/18   Lyn Records, MD  carboxymethylcellulose (REFRESH PLUS) 0.5 % SOLN Place 1 drop into both eyes 3 (three) times daily as needed (dry eyes).    [provider]  docusate sodium (COLACE) 100 MG capsule Take 1 capsule (100 mg total) by mouth every 12  (twelve) hours. 09/07/22   Wallis Bamberg, PA-C  glimepiride (AMARYL) 4 MG tablet Take 2 tablets (8 mg total) by mouth daily. 03/24/23     glucose blood (FREESTYLE LITE) test strip Use to check blood glucose 2 times daily 11/04/21   [name], MD  glucose blood test strip use 1 strip to check blood glucose 2 (two) times daily. 07/25/22   Dorisann Frames, MD  HYDROcodone-acetaminophen (NORCO/VICODIN) 5-325 MG tablet Take 1-2 tablets by mouth every 6 (six) hours as needed. 08/21/22   Vivi Barrack, DPM  ibuprofen (ADVIL,MOTRIN) 200 MG tablet Take 600-800 mg by mouth every 8 (eight) hours as needed for moderate pain.     [provider]  insulin aspart (NOVOLOG FLEXPEN) 100 UNIT/ML FlexPen Inject 2 - 5 Units into the skin 3 times daily before meals 05/03/23     insulin degludec (TRESIBA FLEXTOUCH) 100 UNIT/ML FlexTouch Pen Inject 10 Units into the skin every evening. 12/15/22     Insulin Pen Needle (BD PEN NEEDLE NANO 2ND GEN) 32G X 4 MM MISC Use as directed once a day 03/09/22     Insulin Pen Needle (TECHLITE PEN NEEDLES) 32G X 4 MM MISC Use daily as directed 07/03/22     lidocaine (LIDODERM) 5 % Apply 2 patches to affected area daily for up to 12 hours.  Remove for 12 hours & repeat as directed. Patient  taking differently: Place 1 patch onto the skin daily as needed (pain). 06/18/21     metFORMIN (GLUCOPHAGE-XR) 500 MG 24 hr tablet Take 1 tablet (500 mg total) by mouth 2 (two) times daily. 12/09/22     metoCLOPramide (REGLAN) 5 MG tablet Take 1 tablet (5 mg total) by mouth 2 (two) times daily before a meal as needed. 07/31/22     metoCLOPramide (REGLAN) 5 MG tablet Take 1 tablet (5 mg total) by mouth 2 (two) times daily as needed. 04/28/23     metoprolol succinate (TOPROL-XL) 50 MG 24 hr tablet Take 1 tablet (50 mg total) by mouth daily. (discontinue 25 mg RX) 04/26/23   Chilton Si, MD  nitroGLYCERIN (NITROSTAT) 0.4 MG SL tablet Place 1 tablet (0.4 mg total) under the tongue every 5 (five)  minutes as needed for chest pain. If no relief after 3 doses call 911 04/29/22 04/26/23  Dorcas Carrow, MD  pantoprazole (PROTONIX) 40 MG tablet Take 1 tablet (40 mg total) by mouth 2 (two) times daily. 12/14/22     polyethylene glycol powder (MIRALAX) 17 GM/SCOOP powder Take 255 g by mouth daily as needed for moderate constipation or severe constipation. 09/07/22   Wallis Bamberg, PA-C  promethazine (PHENERGAN) 25 MG tablet Take 1 tablet (25 mg total) by mouth every 8 (eight) hours as needed for nausea or vomiting. 08/21/22   Vivi Barrack, DPM  rosuvastatin (CRESTOR) 5 MG tablet Take 1 tablet (5 mg total) by mouth daily. 01/08/23   Schuh, McKenzi P, PA-C  sertraline (ZOLOFT) 100 MG tablet Take 1 tablet (100 mg total) by mouth daily. 04/02/23     sertraline (ZOLOFT) 50 MG tablet Take 1 tablet (50 mg total) by mouth daily. 01/15/23     sildenafil (REVATIO) 20 MG tablet Take 1 - 5 tablets by mouth as needed as instructed 03/16/23     sitaGLIPtin (JANUVIA) 100 MG tablet Take 1 tablet (100 mg total) by mouth daily. 07/06/22     sodium phosphate (FLEET) 7-19 GM/118ML ENEM Place 133 mLs (1 enema total) rectally daily as needed for severe constipation. 09/07/22   Wallis Bamberg, PA-C  traMADol (ULTRAM) 50 MG tablet Take 50 mg by mouth every 6 (six) hours as needed for moderate pain. 07/16/22   [provider]  VITAMIN D PO Take 1 tablet by mouth daily.    [provider]      Allergies    Humibid la [guaifenesin]    Review of Systems   Review of Systems  Cardiovascular:  Positive for chest pain.  Psychiatric/Behavioral:  Positive for suicidal ideas.   All other systems reviewed and are negative.   Physical Exam Updated Vital Signs BP (!) 128/117 (BP Location: Right Arm)   Pulse 64   Temp 98 F (36.7 C) (Oral)   Resp (!) 21   SpO2 100%   Physical Exam Vitals and nursing note reviewed.  Constitutional:      Appearance: He is well-developed.  HENT:     Head: Normocephalic and  atraumatic.  Eyes:     Conjunctiva/sclera: Conjunctivae normal.     Pupils: Pupils are equal, round, and reactive to light.  Cardiovascular:     Rate and Rhythm: Normal rate and regular rhythm.     Heart sounds: Normal heart sounds.  Pulmonary:     Effort: Pulmonary effort is normal.     Breath sounds: Normal breath sounds.  Abdominal:     General: Bowel sounds are normal.  Palpations: Abdomen is soft.  Musculoskeletal:        General: Normal range of motion.     Cervical back: Normal range of motion.  Skin:    General: Skin is warm and dry.  Neurological:     Mental Status: He is alert and oriented to person, place, and time.  Psychiatric:        Attention and Perception: He does not perceive auditory or visual hallucinations.        Thought Content: Thought content includes suicidal ideation. Thought content does not include homicidal ideation. Thought content includes suicidal plan. Thought content does not include homicidal plan.     Comments: Tearful, seems depressed     ED Results / Procedures / Treatments   Labs (all labs ordered are listed, but only abnormal results are displayed) Labs Reviewed  CBC WITH DIFFERENTIAL/PLATELET - Abnormal; Notable for the following components:      Result Value   Hemoglobin 12.7 (*)    HCT 38.4 (*)    All other components within normal limits  COMPREHENSIVE METABOLIC PANEL - Abnormal; Notable for the following components:   Glucose, Bld 115 (*)    All other components within normal limits  RAPID URINE DRUG SCREEN, HOSP PERFORMED - Abnormal; Notable for the following components:   Tetrahydrocannabinol POSITIVE (*)    All other components within normal limits  SALICYLATE LEVEL - Abnormal; Notable for the following components:   Salicylate Lvl <7.0 (*)    All other components within normal limits  ACETAMINOPHEN LEVEL - Abnormal; Notable for the following components:   Acetaminophen (Tylenol), Serum <10 (*)    All other components  within normal limits  URINALYSIS, ROUTINE W REFLEX MICROSCOPIC - Abnormal; Notable for the following components:   Glucose, UA 150 (*)    Ketones, ur 5 (*)    Protein, ur 30 (*)    Leukocytes,Ua MODERATE (*)    Bacteria, UA RARE (*)    All other components within normal limits  TROPONIN I (HIGH SENSITIVITY) - Abnormal; Notable for the following components:   Troponin I (High Sensitivity) 44 (*)    All other components within normal limits  TROPONIN I (HIGH SENSITIVITY) - Abnormal; Notable for the following components:   Troponin I (High Sensitivity) 41 (*)    All other components within normal limits  URINE CULTURE  ETHANOL    EKG EKG Interpretation Date/Time:  Friday June 18 2023 00:26:29 EDT Ventricular Rate:  78 PR Interval:  143 QRS Duration:  89 QT Interval:  398 QTC Calculation: 454 R Axis:   75  Text Interpretation: Sinus rhythm Atrial premature complex Probable left atrial enlargement Borderline T wave abnormalities ST elevation, consider anterior injury Confirmed by Kennis Carina [phone]) on 06/18/2023 1:58:38 AM  Radiology DG Chest Port 1 View  Result Date: 06/18/2023 CLINICAL DATA:  Chest pain. EXAM: PORTABLE CHEST 1 VIEW COMPARISON:  April 28, 2022 FINDINGS: The heart size and mediastinal contours are within normal limits. Both lungs are clear. The visualized skeletal structures are unremarkable. IMPRESSION: No acute cardiopulmonary disease. Electronically Signed   By: Aram Candela M.D.   On: 06/18/2023 02:39    Procedures Procedures    Medications Ordered in ED Medications  ondansetron (ZOFRAN) injection 4 mg (4 mg Intravenous Not Given 06/18/23 0441)    ED Course/ Medical Decision Making/ A&P  Medical Decision Making Amount and/or Complexity of Data Reviewed Labs: ordered. Radiology: ordered and independent interpretation performed. ECG/medicine tests: ordered and independent interpretation  performed.  Risk Prescription drug management.   73 year old male presenting to the ED from [facility] due to depression and suicidal ideation.  While there he began having chest pain.  He does have history of CAD, therefore sent here for medical evaluation. Pain described as "pinprick" pains to left chest, lasting a second or two at a time.   He is afebrile, non-toxic in appearance here.  EKG sinus rhythm with some atrial abnormalities.  Labs as above--no leukocytosis or electrolyte derangement.  Ethanol is negative.  UDS is positive for THC.  Troponin is 44, similar to prior.  Chest x-ray is clear.  Patient's vitals are stable.  He did undergo admission in August 2023 with similar laboratory findings, cardiology has recommended conservative management given his other comorbidities.  Will obtain delta troponin.  Delta troponin is flat at 41.  Remains hemodynamically stable. Symptoms are somewhat atypical.  Given trops are flat and similar to prior, feel he is medically cleared at this time.  Psychiatry has recommended inpatient treatment so awaiting placement.  Discussed with attending physician, Dr. Pilar Plate, who agrees with assessment and plan of care.  Final Clinical Impression(s) / ED Diagnoses Final diagnoses:  Suicidal ideation  Chest pain in adult    Rx / DC Orders ED Discharge Orders     None         Garlon Hatchet, PA-C 06/18/23 0630    Sabas Sous, MD 06/18/23 [phone]

## 2023-06-18 NOTE — ED Notes (Signed)
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 and "I know exactly what to say to get committed!" RN questioned patient about what was said the reason for him being here; pt states "I said what the lady wanted to hear." To clarify this RN asked patient why would he lie to staff; Pt states he wanted to get away from home because " I did not feel safe there." Patient is calm at this time and continues to require some help with ADL's; Pt had condom cath on that came off and is now using the urinal instead; This RN did go over the plane with patient and that [place] had accepted but SW will try to get another placement set and/or looking into availability at [facility]

## 2023-06-18 NOTE — ED Notes (Signed)
Per pt and wife, who is at bedside, pt uses rollator walker at baseline and is incontinent w/ purewick and condom cath use at home.

## 2023-06-18 NOTE — ED Notes (Signed)
Pt c/o nausea, vomited x2 PA advised and orders placed.

## 2023-06-19 DIAGNOSIS — F332 Major depressive disorder, recurrent severe without psychotic features: Secondary | ICD-10-CM | POA: Diagnosis not present

## 2023-06-19 DIAGNOSIS — R45850 Homicidal ideations: Secondary | ICD-10-CM

## 2023-06-19 DIAGNOSIS — F339 Major depressive disorder, recurrent, unspecified: Secondary | ICD-10-CM | POA: Insufficient documentation

## 2023-06-19 LAB — CBG MONITORING, ED
Glucose-Capillary: 180 mg/dL — ABNORMAL HIGH (ref 70–99)
Glucose-Capillary: 226 mg/dL — ABNORMAL HIGH (ref 70–99)
Glucose-Capillary: 157 mg/dL — ABNORMAL HIGH (ref 70–99)
Glucose-Capillary: 99 mg/dL (ref 70–99)

## 2023-06-19 NOTE — Progress Notes (Signed)
Patient has been faxed out per the request of Ophelia Shoulder, NP due to difficulties securing transportation. Patient meets BH inpatient criteria per Liborio Nixon, NP. Patient has been faxed out to the following facilities:   [facility]  Pending - Request Sent -- [address], [place] [address] [phone] --  [facility]  Pending - Request Sent -- [address], [place] [address] [phone] --  [facility] EFAX  Pending - Request Sent -- [place] [phone] --  [facility]  Pending - Request Sent -- [address] [place] [address] [phone] --  [facility]  Pending - Request Sent -- [facility] [place] [phone] --  [facility]  Pending - Request Sent -- [place] [address] [phone] --  [facility]  Pending - Request Sent -- [address] [place], [place] [address] [phone] --  [facility]  Pending - Request Sent -- [address] [place] [address] [phone] --  [facility]  Pending - Request Sent -- [address] [place] [address] [phone] --  [facility]  Pending - Request Sent -- [address] [place], [place] [phone] --  [facility]  Pending - Request Sent -- [address], [place] [address] [phone] --  [facility]  Pending - Request Sent -- [address], [place] [address] [phone] --  [facility]  Pending - Request Sent -- [address] [place] [address] [phone] --  [facility]  Pending - Request Sent -- [address] [place] [address] [phone] --  [facility]  Pending - Request Sent -- [address] [place] [address] [phone] --  [facility]  Pending - Request Sent -- [address] [place] [address] [place] [address] [phone] --    Damita Dunnings, MSW, LCSW-A  5:17 PM 06/19/2023

## 2023-06-19 NOTE — ED Notes (Signed)
Pt report given to Abel Presto, will receive call back if pt is accepted.

## 2023-06-19 NOTE — ED Notes (Signed)
Wife and pt updated as to patient's status.

## 2023-06-19 NOTE — ED Notes (Signed)
Wife present to unit and updated as to patient's status. Pt currently noted to be transported to [facility].

## 2023-06-19 NOTE — Consult Note (Cosign Needed Addendum)
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 tablet (0.4 mg total) under the tongue Key 5 (five) minutes as needed for chest pain. If no relief after 3 doses call 911 25 tablet 3   pantoprazole (PROTONIX) 40 MG tablet Take 1 tablet (40 mg total) by mouth 2 (two) times daily. (Patient taking differently: Take 40 mg by mouth daily as needed (heartburn).) 180 tablet 0   polyethylene glycol powder (MIRALAX) 17 GM/SCOOP powder Take 255 g by mouth daily as needed for moderate constipation or severe constipation. 510 g 0   rosuvastatin (CRESTOR) 5 MG tablet Take 1 tablet (5 mg total)  by mouth daily. 30 tablet 11   sertraline (ZOLOFT) 100 MG tablet Take  1 tablet (100 mg total) by mouth daily. 90 tablet 3   sildenafil (REVATIO) 20 MG tablet Take 1 - 5 tablets by mouth as needed as instructed 90 tablet 1   sitaGLIPtin (JANUVIA) 100 MG tablet Take 1 tablet (100 mg total) by mouth daily. 90 tablet 4   glucose blood (FREESTYLE LITE) test strip Use to check blood glucose 2 times daily 200 strip 4   glucose blood test strip use 1 strip to check blood glucose 2 (two) times daily. 200 each 3   insulin aspart (NOVOLOG FLEXPEN) 100 UNIT/ML FlexPen Inject 2 - 5 Units into the skin 3 times daily before meals (Patient not taking: Reported on 06/18/2023) 3 mL 5   Insulin Pen Needle (BD PEN NEEDLE NANO 2ND GEN) 32G X 4 MM MISC Use as directed once a day 100 each 6    Musculoskeletal: pt has hx of spinal stenosis and ambulates via wheelchair.  Pt observed moving all extremities during assessment.   Strength & Muscle Tone: within normal limits Gait & Station:  as outlined above Patient leans: N/A   Psychiatric Specialty Exam:  Presentation  General Appearance:  Appropriate for Environment; Casual  Eye Contact: Good  Speech: Pressured; Clear and Coherent  Speech Volume: Normal  Handedness: Ambidextrous   Mood and Affect  Mood: Anxious; Depressed  Affect: Appropriate; Blunt   Thought Process  Thought Processes: Coherent  Descriptions of Associations:Circumstantial  Orientation:Full (Time, Place and Person)  Thought Content:Rumination  History of Schizophrenia/Schizoaffective disorder:No  Duration of Psychotic Symptoms:No data recorded Hallucinations:Hallucinations: None  Ideas of Reference:None  Suicidal Thoughts:Suicidal Thoughts: -- (was present on admission but patient miniizes)  Homicidal Thoughts:Homicidal Thoughts: -- (was present on admission but patient minizes)   Sensorium  Memory: Immediate Good; Recent Good; Remote Good  Judgment: Impaired  Insight: Lacking   Executive Functions   Concentration: Fair  Attention Span: Fair  Recall: Good  Fund of Knowledge: Fair  Language: Good   Psychomotor Activity  Psychomotor Activity:Psychomotor Activity: Normal   Assets  Assets: Communication Skills; Desire for Improvement; Financial Resources/Insurance; Housing; Social Support   Sleep  Sleep:Sleep: Fair Number of Hours of Sleep: 6    Physical Exam: Physical Exam Cardiovascular:     Rate and Rhythm: Normal rate.     Pulses: Normal pulses.  Musculoskeletal:        General: Normal range of motion.     Cervical back: Normal range of motion.  Neurological:     Mental Status: He is alert and oriented to person, place, and time. Mental status is at baseline.  Psychiatric:        Attention and Perception: Attention and perception normal.        Mood and Affect: Mood is anxious and depressed. Affect is blunt.        Speech: Speech normal.        Behavior: Behavior normal. Behavior is cooperative.        Thought Content: Thought content is not paranoid or delusional. Thought content includes homicidal and suicidal ideation. Thought content includes homicidal (was present on admission but patient minizes) and suicidal (was present  admission but patient minimizes) plan.        Cognition and Memory: Cognition and memory normal.        Judgment: Judgment is impulsive.   Review of Systems  Constitutional: Negative.   HENT: Negative.    Eyes: Negative.  Respiratory: Negative.    Cardiovascular:  Negative for chest pain (+ chest pain on admission but has resolved now.).  Gastrointestinal: Negative.   Genitourinary: Negative.   Musculoskeletal:  Positive for back pain (chronic back pain but no new or worsening symptoms today.).  Skin: Negative.   Neurological: Negative.   Endo/Heme/Allergies: Negative.   Psychiatric/Behavioral:  Positive for depression, substance abuse (UDS is + for marijuana) and suicidal ideas. The patient is nervous/anxious.    Blood  pressure (!) 151/76, pulse (!) 55, temperature 98 F (36.7 C), resp. rate 18, SpO2 100%. There is no height or weight on file to calculate BMI.  Treatment Plan Summary: Patient initially presented for evaluation of depression, and homicidal ideations with plan to use his firearm.  Patient is alert and oriented x4; he currently denies SI/HI, minimizes his concerns.  He continues to report mounting psychosocial stressors, and is not established with outpatient psychiatry.  He takes antidepressants but dose is subtherapeutic, he recently had an increase in dose. Pt was accepted to [facility] for today but currently deferred d/t in climate weather related transportation issues.  Discussed with patient my recommendations for continued inpatient admission for continued safety monitoring and medication adjustments.  He is not pleased with this information but respectively voices his understanding and thanked this Clinical research associate for updating him re plan of care.  Daily contact with patient to assess and evaluate symptoms and progress in treatment and Medication management  Continue medications as ordered.   Disposition: Recommend psychiatric Inpatient admission when medically cleared. Patient accepted to [facility] but transportation issues have caused a delay in transfer.  Plan is for transport when Sheriff's office can assist. Above plan discussed with patient concordance.   Dr. Derwood Kaplan; Bobette Mo, RN; Rosey Bath, [facility]; Damita Dunnings, LCSW were informed of above recommendation and disposition via secure chat.  This service was provided via telemedicine using a 2-way, interactive audio and video technology.  Names of all persons participating in this telemedicine service and their role in this encounter. Name: Marlane Mingle Role: Patient   Name: Ophelia Shoulder Role: PMHNP  Name: Harrold Donath Massengill Role: Psychiatrist   Chales Abrahams, NP 06/19/2023 5:41 PM

## 2023-06-19 NOTE — ED Provider Notes (Signed)
Emergency Medicine Observation Re-evaluation Note  Justin Key is a 72 y.o. male, seen on rounds today.  Pt initially presented to the ED for complaints of Chest pain / SI (IVC) Currently, the patient is resting.  Physical Exam  BP (!) 167/99 (BP Location: Left Arm)   Pulse 74   Temp 98.8 F (37.1 C) (Oral)   Resp 18   SpO2 99%  Physical Exam General: No acute distress Cardiac: Regular rate Lungs: No respiratory distress Psych: Currently calm  ED Course / MDM  EKG:EKG Interpretation Date/Time:  Friday June 18 2023 00:26:29 EDT Ventricular Rate:  78 PR Interval:  143 QRS Duration:  89 QT Interval:  398 QTC Calculation: 454 R Axis:   75  Text Interpretation: Sinus rhythm Atrial premature complex Probable left atrial enlargement Borderline T wave abnormalities ST elevation, consider anterior injury Confirmed by Kennis Carina [phone]) on 06/18/2023 1:58:38 AM  I have reviewed the labs performed to date as well as medications administered while in observation.  Recent changes in the last 24 hours include no new changes.  It appears that social work and psychiatry team have recommended geropsych admission.  Plan  Current plan is for awaiting placement for [facility] psych admission.    Derwood Kaplan, MD 06/19/23 1130

## 2023-06-20 LAB — CBG MONITORING, ED
Glucose-Capillary: 163 mg/dL — ABNORMAL HIGH (ref 70–99)
Glucose-Capillary: 201 mg/dL — ABNORMAL HIGH (ref 70–99)
Glucose-Capillary: 147 mg/dL — ABNORMAL HIGH (ref 70–99)
Glucose-Capillary: 159 mg/dL — ABNORMAL HIGH (ref 70–99)

## 2023-06-20 LAB — URINE CULTURE: Culture: 70000 — AB

## 2023-06-20 NOTE — ED Notes (Signed)
Ivc paper work current 

## 2023-06-20 NOTE — Progress Notes (Signed)
CSW faxed the patient out to secure recommended disposition to facilities within a [place]. CSW will continue to seek recommended disposition.    Damita Dunnings, MSW, LCSW-A  10:10 AM 06/20/2023

## 2023-06-20 NOTE — ED Notes (Signed)
Breakfast provided.

## 2023-06-20 NOTE — Progress Notes (Signed)
CSW spoke with Chelesa the Intake RN at [facility] confirmed that the patient's bed will be held until Monday due to transportation issues.    Damita Dunnings, MSW, LCSW-A  10:23 AM 06/20/2023

## 2023-06-20 NOTE — ED Notes (Signed)
Bertha(wife) updated as to patient's status. Wife is aware that pt has a room reserved at [facility] and will be transported there when transportation with sheriff department is confirmed. Wife aware that eta for transport is unknown at this time.

## 2023-06-20 NOTE — ED Provider Notes (Signed)
Emergency Medicine Observation Re-evaluation Note  DARTANYON FRANKOWSKI is a 72 y.o. male, seen on rounds today.  Pt initially presented to the ED for complaints of Chest pain / SI (IVC) Currently, the patient is resting comfortably.  Physical Exam  BP 138/82 (BP Location: Right Arm)   Pulse 68   Temp 98.1 F (36.7 C) (Oral)   Resp 17   SpO2 100%  Physical Exam General: No acute distress Cardiac: Regular rate Lungs: No respiratory distress Psych: Currently calm  ED Course / MDM  EKG:EKG Interpretation Date/Time:  Friday June 18 2023 00:26:29 EDT Ventricular Rate:  78 PR Interval:  143 QRS Duration:  89 QT Interval:  398 QTC Calculation: 454 R Axis:   75  Text Interpretation: Sinus rhythm Atrial premature complex Probable left atrial enlargement Borderline T wave abnormalities ST elevation, consider anterior injury Confirmed by Kennis Carina [phone]) on 06/18/2023 1:58:38 AM  I have reviewed the labs performed to date as well as medications administered while in observation.  Recent changes in the last 24 hours include -no new changes.  Patient will be transferred to [facility] as soon as transfer arrangements have been made.  Plan  Current plan is for admission for psychiatric stabilization.    Derwood Kaplan, MD 06/20/23 [phone]

## 2023-06-21 DIAGNOSIS — F332 Major depressive disorder, recurrent severe without psychotic features: Secondary | ICD-10-CM | POA: Diagnosis not present

## 2023-06-21 LAB — CBG MONITORING, ED: Glucose-Capillary: 138 mg/dL — ABNORMAL HIGH (ref 70–99)

## 2023-06-21 NOTE — ED Notes (Addendum)
After telling pt that sheriff will be here in 20 mins, family is requesting to speak w/ CSW. Pt is against pt going to [place] even though they have verbalized understanding of how an IVC works. Pt's son stating, "his heart and back are too bad for him to travel 3 hrs. Pt's wife now asking to speak to CN too.

## 2023-06-21 NOTE — ED Notes (Signed)
Patient appeared in door way w/o assistance attempting to walk to the bathroom by holding the wall; RN advised patient to stop and allow sitter to assist him or at least assist him to the wheelchair and he can be wheeled to the the bathroom; On arrival back patient asked about getting a replacement condom cath; RN advised patient that staff will not replace condom cath at this time(as it was already replaced a couple of hours prior) and he needs to continue to attempt to use the urinal. RN also noted that condom caths are repeatedly coming off and has to be replaced due to inability to stay on. Patient attempted to say RN was saying that [facility] does not have adhesive condom caths. RN advised patient that he was incorrect in what was being stated....the patient threw the condom cath with drainage bag towards his sitter and told her to give it to me. RN advised patient not be disrespectful and  throw things at staff especially not urine bags. RN and sitter advised patient that if and when he needs to urinate sitter will come and assist him with urinal. Patient c/o urine rolling back on him when he uses it and the bed get wet; RN explains again that staff will be more than willing to assist him with the urinal. Patient c/o bed being wet. When sitter checked the bed it was clean and dry.  RN asked patient what he was going to do at the [facility] facility; Pt is ad dement that he will not be going to a facility because he is not SI/HI. RN reminded patient of prior conversation regarding lying to clinicians about mental status only to get away from home. RN also reminded patient that he is very familiar with how this process works and he knows how IVC works. Patient states when he gets to the next facility he will be calling is lawyer because his "72 hours are up"  Patient was aggressive in his tone and insisted RN leave his room.-Monique,RN

## 2023-06-21 NOTE — ED Notes (Signed)
Reviewed discharge instructions with patient and family. Follow-up care and resources reviewed. Patient and family verbalized understanding. Patient A&Ox4, VSS, and ambulatory with steady gait w/ rollator upon discharge.
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
## 2023-06-21 NOTE — ED Provider Notes (Addendum)
Emergency Medicine Observation Re-evaluation Note  ARBIE BLANKLEY is a 72 y.o. male, seen on rounds today.  Pt initially presented to the ED for complaints of Chest pain / SI (IVC) Currently, the patient is asleep.  Physical Exam  BP (!) 160/87 (BP Location: Right Arm)   Pulse (!) 59   Temp 98.2 F (36.8 C) (Oral)   Resp 20   SpO2 99%  Physical Exam General: asleep Cardiac: asleep Lungs: asleep Psych: asleep  ED Course / MDM  EKG:EKG Interpretation Date/Time:  Friday June 18 2023 00:26:29 EDT Ventricular Rate:  78 PR Interval:  143 QRS Duration:  89 QT Interval:  398 QTC Calculation: 454 R Axis:   75  Text Interpretation: Sinus rhythm Atrial premature complex Probable left atrial enlargement Borderline T wave abnormalities ST elevation, consider anterior injury Confirmed by Kennis Carina [phone]) on 06/18/2023 1:58:38 AM  I have reviewed the labs performed to date as well as medications administered while in observation.  No recent changes in the last 24 hours.  Plan  Current plan is for psychiatric admission, [facility].    Pricilla Loveless, MD 06/21/23 [phone]  Patient has been accepted to [facility], this AM. Accepting is Dr. Jilda Panda.   Pricilla Loveless, MD 06/21/23 [phone]

## 2023-06-21 NOTE — Discharge Instructions (Signed)
  Please utilize psychologytoday.com to find a therapist near you. You can also use this to find religious based counseling as well.   Christian counselors in [place]  - [facility]ner, broadcasting/film/video Counseling [phone] - [facility] [phone]  COUNSELING AGENCIES in [facility] [facility] [address] [place].  [phone]  [facility] [address] [place].    [phone]  Individual and [facility] [address] [place].    [phone]   Habla Espaol/Interprete  [facility] [address].    [phone]  [facility] [address]  "[place]"    [phone]   [facility] [address] [place].     [phone] [facility] ([facility]) [address] [place].  [phone]  Psychiatric services/servicios psiquiatricos  [facility] [address] [place]   [phone] [facility] [address]     [phone]  [facility] [address]      [phone]   Habla Espaol/Interprete & Psychiatric services/servicios psiquiatricos  [facility] [address]  [phone]  [facility] [address] [place].     [phone]  [facility] [address]    [phone] Psychotherapeutic Services [address] (72yo & over only)     [phone]    [phone]  Provides information on mental health, intellectual/developmental disabilities & substance abuse services in [facility]

## 2023-06-21 NOTE — ED Notes (Signed)
CN and Coleman NP at bedside speaking w/ family at this time

## 2023-06-21 NOTE — ED Provider Notes (Signed)
Patient was reassessed by psychiatry and they feel he is stable for discharge.  He is not suicidal or homicidal and notes that he was trying to manipulate the system.  He is currently with family in the room and they all feel comfortable with him going home.  Will release from IVC and discharge.   Pricilla Loveless, MD 06/21/23 [phone]

## 2023-06-24 ENCOUNTER — Other Ambulatory Visit (HOSPITAL_COMMUNITY): Payer: Self-pay

## 2023-06-30 ENCOUNTER — Other Ambulatory Visit (HOSPITAL_COMMUNITY): Payer: Self-pay

## 2023-07-02 ENCOUNTER — Other Ambulatory Visit (HOSPITAL_COMMUNITY): Payer: Self-pay

## 2023-07-02 DIAGNOSIS — Z9181 History of falling: Secondary | ICD-10-CM | POA: Diagnosis not present

## 2023-07-02 DIAGNOSIS — G629 Polyneuropathy, unspecified: Secondary | ICD-10-CM | POA: Diagnosis not present

## 2023-07-02 DIAGNOSIS — E1151 Type 2 diabetes mellitus with diabetic peripheral angiopathy without gangrene: Secondary | ICD-10-CM | POA: Diagnosis not present

## 2023-07-02 DIAGNOSIS — Z6824 Body mass index (BMI) 24.0-24.9, adult: Secondary | ICD-10-CM | POA: Diagnosis not present

## 2023-07-02 DIAGNOSIS — F332 Major depressive disorder, recurrent severe without psychotic features: Secondary | ICD-10-CM | POA: Diagnosis not present

## 2023-07-02 DIAGNOSIS — Z89412 Acquired absence of left great toe: Secondary | ICD-10-CM | POA: Diagnosis not present

## 2023-07-02 DIAGNOSIS — Z794 Long term (current) use of insulin: Secondary | ICD-10-CM | POA: Diagnosis not present

## 2023-07-02 DIAGNOSIS — E1142 Type 2 diabetes mellitus with diabetic polyneuropathy: Secondary | ICD-10-CM | POA: Diagnosis not present

## 2023-07-02 DIAGNOSIS — I25119 Atherosclerotic heart disease of native coronary artery with unspecified angina pectoris: Secondary | ICD-10-CM | POA: Diagnosis not present

## 2023-07-05 ENCOUNTER — Telehealth (HOSPITAL_BASED_OUTPATIENT_CLINIC_OR_DEPARTMENT_OTHER): Payer: Self-pay | Admitting: *Deleted

## 2023-07-05 NOTE — Telephone Encounter (Addendum)
-----   Message from [facility] sent at 07/05/2023  3:05 PM EDT ----- Recommend echo based on aortic valve calcification seen on chest CT.    Tiffany C. Duke Salvia, MD, [facility]  Left message to call back

## 2023-07-10 ENCOUNTER — Other Ambulatory Visit (HOSPITAL_COMMUNITY): Payer: Self-pay

## 2023-07-13 ENCOUNTER — Other Ambulatory Visit (HOSPITAL_COMMUNITY): Payer: Self-pay

## 2023-07-13 MED ORDER — INSULIN PEN NEEDLE 32G X 4 MM MISC
6 refills | Status: AC
  Filled 2023-07-13: qty 100, 100d supply, fill #0
  Filled 2023-11-27: qty 100, 100d supply, fill #1
  Filled 2024-07-03: qty 100, 100d supply, fill #2

## 2023-07-14 ENCOUNTER — Other Ambulatory Visit (HOSPITAL_COMMUNITY): Payer: Self-pay

## 2023-07-15 ENCOUNTER — Other Ambulatory Visit (HOSPITAL_COMMUNITY): Payer: Self-pay

## 2023-07-15 MED ORDER — SERTRALINE HCL 100 MG PO TABS
200.0000 mg | ORAL_TABLET | Freq: Every day | ORAL | 0 refills | Status: DC
  Filled 2023-07-15: qty 180, 90d supply, fill #0

## 2023-07-16 ENCOUNTER — Other Ambulatory Visit (HOSPITAL_COMMUNITY): Payer: Self-pay

## 2023-07-16 MED ORDER — JANUVIA 100 MG PO TABS
100.0000 mg | ORAL_TABLET | Freq: Every day | ORAL | 4 refills | Status: DC
  Filled 2023-07-16: qty 90, 90d supply, fill #0
  Filled 2023-12-07: qty 90, 90d supply, fill #1
  Filled 2024-06-15: qty 90, 90d supply, fill #2

## 2023-07-19 NOTE — Telephone Encounter (Signed)
2nd call attempt, no answer, left message to call back!  

## 2023-07-20 ENCOUNTER — Encounter (HOSPITAL_BASED_OUTPATIENT_CLINIC_OR_DEPARTMENT_OTHER): Payer: Self-pay

## 2023-07-20 NOTE — Telephone Encounter (Signed)
3rd call attempt, no answer, left message to call back, letter mailed to patient

## 2023-07-22 ENCOUNTER — Encounter: Payer: Self-pay | Admitting: Internal Medicine

## 2023-07-22 ENCOUNTER — Ambulatory Visit (INDEPENDENT_AMBULATORY_CARE_PROVIDER_SITE_OTHER): Payer: PPO | Admitting: Internal Medicine

## 2023-07-22 VITALS — BP 140/74 | HR 68 | Ht 75.0 in | Wt 185.5 lb

## 2023-07-22 DIAGNOSIS — R110 Nausea: Secondary | ICD-10-CM
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
## 2023-07-26 ENCOUNTER — Other Ambulatory Visit (HOSPITAL_COMMUNITY): Payer: Self-pay

## 2023-07-27 ENCOUNTER — Telehealth: Payer: Self-pay

## 2023-07-27 ENCOUNTER — Other Ambulatory Visit (HOSPITAL_COMMUNITY): Payer: Self-pay

## 2023-07-27 DIAGNOSIS — I359 Nonrheumatic aortic valve disorder, unspecified: Secondary | ICD-10-CM

## 2023-07-27 MED ORDER — GLUCOSE BLOOD VI STRP
ORAL_STRIP | 3 refills | Status: DC
  Filled 2023-07-27: qty 200, 100d supply, fill #0
  Filled 2024-01-27: qty 200, 100d supply, fill #1

## 2023-07-27 NOTE — Telephone Encounter (Signed)
Returned call to patient, reviewed recommendations and patient is agreeable. Echo ordered and will route to scheduling team        ----- Message from [facility] sent at 07/05/2023  3:05 PM EDT ----- Recommend echo based on aortic valve calcification seen on chest CT.     Tiffany C. Duke Salvia, MD, [facility]   Left message to call back

## 2023-07-27 NOTE — Telephone Encounter (Signed)
Pt's wife returning nurses phone call from phone note on 10/14. Please advise

## 2023-08-02 ENCOUNTER — Other Ambulatory Visit (HOSPITAL_COMMUNITY): Payer: Self-pay

## 2023-08-03 ENCOUNTER — Other Ambulatory Visit (HOSPITAL_COMMUNITY): Payer: Self-pay

## 2023-08-05 ENCOUNTER — Other Ambulatory Visit (HOSPITAL_COMMUNITY): Payer: Self-pay

## 2023-08-05 MED ORDER — PANTOPRAZOLE SODIUM 40 MG PO TBEC
40.0000 mg | DELAYED_RELEASE_TABLET | Freq: Two times a day (BID) | ORAL | 0 refills | Status: DC
  Filled 2023-08-05: qty 180, 90d supply, fill #0

## 2023-08-06 ENCOUNTER — Other Ambulatory Visit (HOSPITAL_COMMUNITY): Payer: Self-pay

## 2023-08-11 ENCOUNTER — Other Ambulatory Visit (HOSPITAL_COMMUNITY): Payer: Self-pay

## 2023-08-12 ENCOUNTER — Other Ambulatory Visit (HOSPITAL_COMMUNITY): Payer: Self-pay

## 2023-08-16 ENCOUNTER — Other Ambulatory Visit (HOSPITAL_COMMUNITY): Payer: Self-pay

## 2023-08-16 MED ORDER — METFORMIN HCL ER 500 MG PO TB24
500.0000 mg | ORAL_TABLET | Freq: Two times a day (BID) | ORAL | 6 refills | Status: DC
  Filled 2023-08-16: qty 60, 30d supply, fill #0
  Filled 2023-10-01: qty 60, 30d supply, fill #1

## 2023-08-17 ENCOUNTER — Telehealth: Payer: Self-pay | Admitting: Podiatry

## 2023-08-17 NOTE — Telephone Encounter (Signed)
Called home and cell number and left message for pt to see if they could come this afternoon at 315pm..

## 2023-08-17 NOTE — Telephone Encounter (Signed)
Pts wife called stating pt has a hard callus on the area where his toe was amputated and has blister on other foot. Pt is diabetic. I offered him an appt this morning with Dr Ardelle Anton and tomorrow with Dr Allena Katz. Pt only wants to see Dr Ardelle Anton and needs an afternoon appt. The next afternoon I had with Dr Ardelle Anton was 12/12 and I did add to waitlist.

## 2023-08-18 ENCOUNTER — Ambulatory Visit: Payer: PPO | Admitting: Podiatry

## 2023-08-31 ENCOUNTER — Encounter (HOSPITAL_COMMUNITY): Payer: PPO

## 2023-08-31 ENCOUNTER — Other Ambulatory Visit (HOSPITAL_COMMUNITY): Payer: PPO

## 2023-08-31 ENCOUNTER — Ambulatory Visit: Payer: PPO

## 2023-09-02 ENCOUNTER — Ambulatory Visit: Payer: PPO | Admitting: Podiatry

## 2023-09-02 DIAGNOSIS — E1149 Type 2 diabetes mellitus with other diabetic neurological complication: Secondary | ICD-10-CM | POA: Diagnosis not present

## 2023-09-02 DIAGNOSIS — B351 Tinea unguium: Secondary | ICD-10-CM | POA: Diagnosis not present

## 2023-09-02 DIAGNOSIS — I739 Peripheral vascular disease, unspecified: Secondary | ICD-10-CM | POA: Diagnosis not present

## 2023-09-02 DIAGNOSIS — L84 Corns and callosities: Secondary | ICD-10-CM | POA: Diagnosis not present

## 2023-09-02 NOTE — Patient Instructions (Signed)

## 2023-09-02 NOTE — Progress Notes (Signed)
Subjective:  No chief complaint on file.  Justin Key is a 72 year old male presents the office today with concerns of a lesion on the left hallux.  No open lesions that they report.  First there was a blister but nails turned hard.  Nails are also elongated causing discomfort at times.  No open lesions otherwise.  Objective: General: No acute distress, AAOx3  Foot appears to be well-perfused. Sensation decreased There is a very hypertrophic and dystrophic nails 2 through 5 on the left and 1 through 4 on the right.  There is no edema, erythema or signs of infection. Along the lateral aspect left hallux is a hyperkeratotic lesion.  Upon debridement there is no ulceration was 1 small pinpoint area of bleeding but there is no surrounding erythema, ascending size.  No fluctuation or crepitation.  There is no malodor. No pain with calf compression, swelling, warmth, erythema.    Assessment and Plan:  Symptomatic onychomycosis, preulcerative lesion left hallux  -Treatment options discussed including all alternatives, risks, and complications -Debrided nails x 8 without any complications or bleeding. -I debrided the hyperkeratotic lesion on the left lateral hallux but this could have come from an old blister that is now callused over.  There was 1 very small pinpoint area of bleeding noted.  Small amount of antibiotic ointment is applied followed by dressing.  Discussed washing with soap and water, dry thoroughly apply similar bandage.  It is not healed next week of is any worsening to let me know. -Monitor for any clinical signs or symptoms of infection and directed to call the office immediately should any occur or go to the ER.  Return in about 9 weeks (around 11/04/2023).  Vivi Barrack DPM

## 2023-09-09 ENCOUNTER — Other Ambulatory Visit (HOSPITAL_COMMUNITY): Payer: Self-pay

## 2023-09-13 ENCOUNTER — Other Ambulatory Visit (HOSPITAL_COMMUNITY): Payer: Self-pay

## 2023-09-13 MED ORDER — SILDENAFIL CITRATE 20 MG PO TABS
20.0000 mg | ORAL_TABLET | ORAL | 3 refills | Status: DC | PRN
  Filled 2023-09-13: qty 90, 18d supply, fill #0
  Filled 2023-12-02: qty 90, 30d supply, fill #1
  Filled 2024-01-25 (×2): qty 90, 30d supply, fill #2
  Filled 2024-03-07: qty 90, 30d supply, fill #3

## 2023-09-27 ENCOUNTER — Ambulatory Visit (HOSPITAL_BASED_OUTPATIENT_CLINIC_OR_DEPARTMENT_OTHER): Payer: PPO | Admitting: Family

## 2023-09-27 ENCOUNTER — Other Ambulatory Visit (HOSPITAL_COMMUNITY): Payer: Self-pay

## 2023-09-27 ENCOUNTER — Encounter (HOSPITAL_BASED_OUTPATIENT_CLINIC_OR_DEPARTMENT_OTHER): Payer: Self-pay | Admitting: Family

## 2023-09-27 VITALS — BP 121/73 | HR 63 | Ht 75.0 in | Wt 189.4 lb

## 2023-09-27 DIAGNOSIS — I25118 Atherosclerotic heart disease of native coronary artery with other forms of angina pectoris: Secondary | ICD-10-CM | POA: Diagnosis not present

## 2023-09-27 DIAGNOSIS — E782 Mixed hyperlipidemia: Secondary | ICD-10-CM | POA: Diagnosis not present

## 2023-09-27 MED ORDER — NITROGLYCERIN 0.4 MG SL SUBL
0.4000 mg | SUBLINGUAL_TABLET | SUBLINGUAL | 3 refills | Status: DC | PRN
  Filled 2023-09-27: qty 25, 7d supply, fill #0

## 2023-09-27 NOTE — Patient Instructions (Addendum)
 Medication Instructions:  Continue your current medications.   Our goal is for your LDL (bad cholesterol) to be less than 55. We have reached out to get your most recent cholesterol labs from Dr. Dianne office. Based on those results, we will likely increase your Rosuvastatin  dose.   *If you need a refill on your cardiac medications before your next appointment, please call your pharmacy*  Labs: Your physician recommends that you return for lab work in: 2-3 months after changing the cholesterol medicines.   Please return for Lab work. You may come to the...   [facility] (3rd floor) [address] [place], [place] [address]  Open: 8am-Noon and 1pm-4:30pm  Please ring the doorbell on the small table when you exit the elevator and the Lab Tech will come get you  [facility] [address], [place], [place] [address] Open: 8am-1pm, then 2pm-4:30pm   [facility]- Please see attached locations sheet stapled to your lab work with address and hours.    Follow-Up: At [facility], you and your health needs are our priority.  As part of our continuing mission to provide you with exceptional heart care, we have created designated Provider Care Teams.  These Care Teams include your primary Cardiologist (physician) and Advanced Practice Providers (APPs -  Physician Assistants and Nurse Practitioners) who all work together to provide you with the care you need, when you need it.  We recommend signing up for the patient portal called MyChart.  Sign up information is provided on this After Visit Summary.  MyChart is used to connect with patients for Virtual Visits (Telemedicine).  Patients are able to view lab/test results, encounter notes, upcoming appointments, etc.  Non-urgent messages can be sent to your provider as well.   To learn more about what you can do with MyChart, go to [email or web address].    Your next  appointment:   3-4 month(s)  Provider:   Annabella Scarce, MD or Reche GORMAN Finder, NP    Other Instructions  Exercises to do While Sitting Warm-up Before starting other exercises: Sit up as straight as you can. Have your knees bent at 90 degrees, which is the shape of the capital letter L. Keep your feet flat on the floor. Sit at the front edge of your chair, if you can. Pull in (tighten) the muscles in your abdomen and stretch your spine and neck as straight as you can. Hold this position for a few minutes. Breathe in and out evenly. Try to concentrate on your breathing, and relax your mind.  Stretching Exercise A: Arm stretch Hold your arms out straight in front of your body. Bend your hands at the wrist with your fingers pointing up, as if signaling someone to stop. Notice the slight tension in your forearms as you hold the position. Keeping your arms out and your hands bent, rotate your hands outward as far as you can and hold this stretch. Aim to have your thumbs pointing up and your pinkie fingers pointing down. Slowly repeat arm stretches for one minute as tolerated. Exercise B: Leg stretch If you can move your legs, try to draw letters on the floor with the toes of your foot. Write your name with one foot. Write your name with the toes of your other foot. Slowly repeat the movements for one minute as tolerated. Exercise C: Reach for the sky Reach your hands as far over your head as you can to stretch your  spine. Move your hands and arms as if you are climbing a rope. Slowly repeat the movements for one minute as tolerated.  Range of motion exercises Exercise A: Shoulder roll Let your arms hang loosely at your sides. Lift just your shoulders up toward your ears, then let them relax back down. When your shoulders feel loose, rotate your shoulders in backward and forward circles. Do shoulder rolls slowly for one minute as tolerated. Exercise B: March in place As if you  are marching, pump your arms and lift your legs up and down. Lift your knees as high as you can. If you are unable to lift your knees, just pump your arms and move your ankles and feet up and down. March in place for one minute as tolerated. Exercise C: Seated jumping jacks Let your arms hang down straight. Keeping your arms straight, lift them up over your head. Aim to point your fingers to the ceiling. While you lift your arms, straighten your legs and slide your heels along the floor to your sides, as wide as you can. As you bring your arms back down to your sides, slide your legs back together. If you are unable to use your legs, just move your arms. Slowly repeat seated jumping jacks for one minute as tolerated.  Strengthening exercises Exercise A: Shoulder squeeze Hold your arms straight out from your body to your sides, with your elbows bent and your fists pointed at the ceiling. Keeping your arms in the bent position, move them forward so your elbows and forearms meet in front of your face. Open your arms back out as wide as you can with your elbows still bent, until you feel your shoulder blades squeezing together. Hold for 5 seconds. Slowly repeat the movements forward and backward for one minute as tolerated.

## 2023-09-27 NOTE — Progress Notes (Signed)
 Cardiology Office Note:  .   Date:  10/02/2023  ID:  Justin Key, DOB 1951/04/02, MRN [id number] PCP: Cleotilde Planas, MD  [facility] Providers Cardiologist:  Annabella Scarce, MD    History of Present Illness: .   QURON RUDDY is a 73 y.o. male with hx of CAD s/p MI with angioplasty 1997, PVD, HLD, Dm2, gastroparesis, GERD, HLD. Previous patient of Dr. Claudene having since established with Dr. Scarce.   Prior LHC 2019 moderate CAD. Remote poor experience with statin. Admitted 06/2022 with L common femoral artery to below knee popliteal artery bypass. 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   Last seen 04/26/23 with atypical chest pain. Metoprolol  was increased for antianginal benefit.   Presents today for follow up with his wife. Notes episodes of chest pain occurring at rest described as ping, ping but has not required PRN nitroglycerin . Hesitant regarding PRN nitroglycerin  due to headache. Overall sedentary lifestyle. Reports no shortness of breath nor dyspnea on exertion. No edema, orthopnea, PND. Reports no palpitations.    ROS: Please see the history of present illness.    All other systems reviewed and are negative.   Studies Reviewed: .        Cardiac Studies & Procedures   CARDIAC CATHETERIZATION  CARDIAC CATHETERIZATION 08/24/2018  Narrative  Moderately severe two-vessel coronary artery disease.  30 to 40% ostial left main with minimal cross-sectional area 9.97 mm (significant less than 6 mm).  Widely patent left anterior descending and diagonal.  LAD stops of the left ventricular apex.  The circumflex is a large vessel that gives origin to 3 obtuse marginal branches.  The first marginal is tiny, with ostial 90% narrowing.  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  The larger third obtuse marginal is widely patent.  The mid circumflex beyond the second obtuse marginal  contains eccentric 60% disease.  The right coronary contains moderately severe diffuse disease from the proximal to distal segment and also involving the PDA.  Left ventriculography reveals severe inferior and apical hypokinesis.  EF 40 to 45%.  Normal LVEDP  RECOMMENDATIONS:   Given relatively diffuse coronary disease and stable/chronic coronary presentation, appropriate management includes secondary risk prevention with LDL lowering and aggressive management of diabetes.  Consider SGLT2 therapy for diabetes in addition to agents already being used.  Institute anti-ischemic therapy, will start low-dose beta-blocker therapy in the form of metoprolol  succinate 25 mg/day and consider adding either long-acting nitrates or Ranexa if symptoms are not controlled.  Detailed history reveals as some of the patient's chest discomfort is likely non-ischemic related with much of the pain being sharp stabbing quality discomfort.  Findings Coronary Findings Diagnostic  Dominance: Right  Left Main Ost LM lesion is 50% stenosed.  Left Circumflex Mid Cx lesion is 65% stenosed.  First Obtuse Marginal Branch Vessel is small in size. Ost 1st Mrg lesion is 90% stenosed.  Second Obtuse Marginal Branch Vessel is moderate in size. Ost 2nd Mrg lesion is 90% stenosed.  Third Obtuse Marginal Branch Vessel is large in size.  Right Coronary Artery Prox RCA lesion is 50% stenosed. Mid RCA lesion is 80% stenosed. Dist RCA lesion is 60% stenosed.  Right Posterior Descending Artery Ost RPDA to RPDA lesion is 80% stenosed.  Intervention  No interventions have been documented.   STRESS TESTS  MYOCARDIAL PERFUSION IMAGING 06/05/2022  Narrative   LV perfusion is abnormal. There is  no evidence of ischemia. There is evidence of infarction. Defect 1: There is a medium defect with severe reduction in uptake present in the apical to basal inferior location(s) that is fixed. There is abnormal wall motion in  the defect area. Consistent with infarction.   Left ventricular function is abnormal. Nuclear stress EF: 48 %. The left ventricular ejection fraction is mildly decreased (45-54%). End diastolic cavity size is normal. End systolic cavity size is normal.   Findings are consistent with prior myocardial infarction. The study is intermediate risk.              Risk Assessment/Calculations:            Physical Exam:   VS:  BP 121/73 (BP Location: Left Arm, Patient Position: Sitting, Cuff Size: Normal)   Pulse 63   Ht 6' 3 (1.905 m)   Wt 189 lb 6.4 oz (85.9 kg)   SpO2 100%   BMI 23.67 kg/m    Wt Readings from Last 3 Encounters:  09/27/23 189 lb 6.4 oz (85.9 kg)  07/22/23 185 lb 8 oz (84.1 kg)  04/26/23 179 lb 4.8 oz (81.3 kg)    GEN: Well nourished, well developed in no acute distress NECK: No JVD; No carotid bruits CARDIAC: RRR, no murmurs, rubs, gallops RESPIRATORY:  Clear to auscultation without rales, wheezing or rhonchi  ABDOMEN: Soft, non-tender, non-distended EXTREMITIES:  No edema; No deformity   ASSESSMENT AND PLAN: .    CAD - Known severe medically managed CAD. Myoview  2023 old MI but no new ischemia. Occasional chest pain at rest which self resolves and is overall atypical for angina. Continue GDMT aspirin , metoprolol , rosuvastatin . No imdur due to headache with nitroglycerin . Discussed Ranexa or repeat imaging such as myoview .Given infrequency and atypical nature of symptoms, will defer. Encouraged sitting exercises due to multiple prior back surgeries and inactivity.   HLD, LDL goal <55 - presently on rosuvastatin  5mg  daily. Previously hesitant regarding med changes. Will request recent labs from PCP and consider PCSK9i vs Leqvio. Plan to repeat lipid panel 3 mos after medication changes.  PAD - No claudication. Follows with [facility].       Dispo: follow up in 3-4 mos  Signed, Reche GORMAN Finder, NP

## 2023-10-01 ENCOUNTER — Other Ambulatory Visit (HOSPITAL_COMMUNITY): Payer: Self-pay

## 2023-10-02 ENCOUNTER — Encounter (HOSPITAL_BASED_OUTPATIENT_CLINIC_OR_DEPARTMENT_OTHER): Payer: Self-pay | Admitting: Family

## 2023-10-12 ENCOUNTER — Other Ambulatory Visit (HOSPITAL_COMMUNITY): Payer: Self-pay

## 2023-10-13 ENCOUNTER — Other Ambulatory Visit (HOSPITAL_COMMUNITY): Payer: Self-pay

## 2023-10-13 MED ORDER — TRESIBA FLEXTOUCH 100 UNIT/ML ~~LOC~~ SOPN
PEN_INJECTOR | SUBCUTANEOUS | 3 refills | Status: DC
  Filled 2023-10-13: qty 9, 90d supply, fill #0

## 2023-10-18 ENCOUNTER — Other Ambulatory Visit (HOSPITAL_COMMUNITY): Payer: Self-pay

## 2023-10-18 DIAGNOSIS — E1165 Type 2 diabetes mellitus with hyperglycemia: Secondary | ICD-10-CM | POA: Diagnosis not present

## 2023-10-18 MED ORDER — TRESIBA FLEXTOUCH 100 UNIT/ML ~~LOC~~ SOPN
PEN_INJECTOR | SUBCUTANEOUS | 1 refills | Status: DC
  Filled 2023-10-18: qty 12, 100d supply, fill #0

## 2023-10-21 ENCOUNTER — Ambulatory Visit (HOSPITAL_COMMUNITY)
Admission: RE | Admit: 2023-10-21 | Discharge: 2023-10-21 | Disposition: A | Discharge status: Home / Self Care | Payer: PPO | Source: Ambulatory Visit | Attending: Vascular Surgery | Admitting: Vascular Surgery

## 2023-10-21 ENCOUNTER — Ambulatory Visit: Payer: PPO | Admitting: Physician Assistant

## 2023-10-21 VITALS — BP 139/80 | HR 86 | Temp 97.5°F | Resp 20 | Ht 75.0 in | Wt 189.0 lb

## 2023-10-21 DIAGNOSIS — I70262 Atherosclerosis of native arteries of extremities with gangrene, left leg: Secondary | ICD-10-CM | POA: Diagnosis not present

## 2023-10-21 DIAGNOSIS — R52 Pain, unspecified: Secondary | ICD-10-CM

## 2023-10-21 DIAGNOSIS — G548 Other nerve root and plexus disorders: Secondary | ICD-10-CM

## 2023-10-21 DIAGNOSIS — I739 Peripheral vascular disease, unspecified: Secondary | ICD-10-CM

## 2023-10-21 DIAGNOSIS — I70269 Atherosclerosis of native arteries of extremities with gangrene, unspecified extremity: Secondary | ICD-10-CM | POA: Diagnosis present

## 2023-10-21 LAB — VAS US ABI WITH/WO TBI
Left ABI: 0.84
Right ABI: 0.94

## 2023-10-21 NOTE — Progress Notes (Signed)
Office Note   History of Present Illness   Justin Key is a 73 y.o. (March 08, 1951) male who presents for surveillance of PAD.  He has a history of left common femoral to below-knee popliteal artery bypass with GSV by Dr. Edilia Bo on 06/23/2022.  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 He subsequently underwent left great toe amputation by podiatry on 08/21/2022.  He returns today for follow-up. He states that everything has been going ok since his last follow up.  He walks a little bit, but his mobilization is largely limited by his lumbar disc disease. He mostly gets around in a wheelchair.  He denies any claudication, rest pain, or tissue loss of bilateral lower extremities. He states over the past year he has been dealing with occasional shooting pains around his left lower leg and inner ankle. This is not associated with movement or body positioning.   He takes a daily aspirin and statin.   Current Outpatient Medications  Medication Sig Dispense Refill   aspirin EC 81 MG tablet Take 1 tablet (81 mg total) by mouth daily. 90 tablet 3   docusate sodium (COLACE) 100 MG capsule Take 1 capsule (100 mg total) by mouth every 12 (twelve) hours. (Patient not taking: Reported on 09/27/2023) 180 capsule 0   glimepiride (AMARYL) 4 MG tablet Take 2 tablets (8 mg total) by mouth daily. 180 tablet 1   glucose blood (FREESTYLE LITE) test strip Use to check blood glucose 2 times daily 200 strip 4   glucose blood test strip use 1 strip to check blood glucose 2 (two) times daily. 200 each 3   glucose blood test strip Use to check blood sugar 2 times a day 200 each 3   ibuprofen (ADVIL,MOTRIN) 200 MG tablet Take 600-800 mg by mouth every 8 (eight) hours as needed for moderate pain.      insulin aspart (NOVOLOG FLEXPEN) 100 UNIT/ML FlexPen Inject 2 - 5 Units into the skin 3 times daily before meals 3 mL 5   insulin degludec (TRESIBA FLEXTOUCH) 100 UNIT/ML FlexTouch Pen Inject 10  Units into the skin every evening. 9 mL 3   insulin degludec (TRESIBA FLEXTOUCH) 100 UNIT/ML FlexTouch Pen Inject 12 Units into the skin every evening. 12 mL 1   Insulin Pen Needle 32G X 4 MM MISC Use once a day as directed. 100 each 6   lidocaine (LIDODERM) 5 % Apply 2 patches to affected area daily for up to 12 hours.  Remove for 12 hours & repeat as directed. (Patient taking differently: Place 1 patch onto the skin daily as needed (pain).) 60 patch 11   metFORMIN (GLUCOPHAGE-XR) 500 MG 24 hr tablet Take 1 tablet (500 mg total) by mouth 2 (two) times daily. 60 tablet 6   metoCLOPramide (REGLAN) 5 MG tablet Take 1 tablet (5 mg total) by mouth 2 (two) times daily before a meal as needed. 60 tablet 0   metoprolol succinate (TOPROL-XL) 50 MG 24 hr tablet Take 1 tablet (50 mg total) by mouth daily. (discontinue 25 mg RX) 90 tablet 3   nitroGLYCERIN (NITROSTAT) 0.4 MG SL tablet Place 1 tablet (0.4 mg total) under the tongue every 5 (five) minutes as needed for chest pain. If no relief after 3 doses call 911 25 tablet 3   pantoprazole (PROTONIX) 40 MG tablet Take 1 tablet (40 mg total) by mouth 2 (two) times daily. 180 tablet 0   polyethylene glycol powder (MIRALAX) 17 GM/SCOOP  powder Take 255 g by mouth daily as needed for moderate constipation or severe constipation. 510 g 0   rosuvastatin (CRESTOR) 5 MG tablet Take 1 tablet (5 mg total) by mouth daily. 30 tablet 11   sertraline (ZOLOFT) 100 MG tablet Take 1 tablet (100 mg total) by mouth daily. 90 tablet 3   sertraline (ZOLOFT) 100 MG tablet Take 2 tablets (200 mg total) by mouth daily. 180 tablet 0   sildenafil (REVATIO) 20 MG tablet Take 1 - 5 tablets by mouth as needed as instructed 90 tablet 1   sildenafil (REVATIO) 20 MG tablet Take 1-5 tablets (20-100 mg total) by mouth as needed as instructed. 90 tablet 3   sitaGLIPtin (JANUVIA) 100 MG tablet Take 1 tablet (100 mg total) by mouth daily. 90 tablet 4   No current facility-administered medications  for this visit.    REVIEW OF SYSTEMS (negative unless checked):   Cardiac:  []  Chest pain or chest pressure? []  Shortness of breath upon activity? []  Shortness of breath when lying flat? []  Irregular heart rhythm?  Vascular:  []  Pain in calf, thigh, or hip brought on by walking? []  Pain in feet at night that wakes you up from your sleep? []  Blood clot in your veins? []  Leg swelling?  Pulmonary:  []  Oxygen at home? []  Productive cough? []  Wheezing?  Neurologic:  []  Sudden weakness in arms or legs? []  Sudden numbness in arms or legs? []  Sudden onset of difficult speaking or slurred speech? []  Temporary loss of vision in one eye? []  Problems with dizziness?  Gastrointestinal:  []  Blood in stool? []  Vomited blood?  Genitourinary:  []  Burning when urinating? []  Blood in urine?  Psychiatric:  []  Major depression  Hematologic:  []  Bleeding problems? []  Problems with blood clotting?  Dermatologic:  []  Rashes or ulcers?  Constitutional:  []  Fever or chills?  Ear/Nose/Throat:  []  Change in hearing? []  Nose bleeds? []  Sore throat?  Musculoskeletal:  []  Back pain? []  Joint pain? []  Muscle pain?   Physical Examination   Vitals:   10/21/23 1435  BP: 139/80  Pulse: 86  Resp: 20  Temp: (!) 97.5 F (36.4 C)  TempSrc: Temporal  SpO2: 90%  Weight: 189 lb (85.7 kg)  Height: 6\' 3"  (1.905 m)   Body mass index is 23.62 kg/m.  General:  WDWN in NAD Gait: in a wheelchair HENT: WNL, normocephalic Pulmonary: normal non-labored breathing , without rales, rhonchi,  wheezing Cardiac: regular Abdomen: soft, NT, no masses Skin: without rashes Vascular Exam/Pulses: monophasic left PT and peroneal doppler signals. Monophasic right DP/PT doppler signals Extremities: without ischemic changes, without gangrene , without cellulitis; without open wounds; well healed left GT amp Musculoskeletal: no muscle wasting or atrophy  Neurologic: A&O X 3;  No focal weakness or  paresthesias are detected Psychiatric:  The pt has Normal affect.  Non-Invasive Vascular Imaging ABI (10/21/2023) R:  ABI: 0.94 (),  PT: mono DP: mono TBI:  0.88 L:  ABI: 0.84 (0.47),  PT: mono DP: mono   LLE Bypass Duplex (10/21/2023) Patent left common femoral to below-knee popliteal artery bypass without stenosis.   Medical Decision Making   Justin Key is a 73 y.o. male who presents for surveillance of PAD  Based on the patient's vascular studies and examination, his ABIs are essentially unchanged from his last visit.  His right ABI 0.94.  His left ABI 0.84.  He has monophasic tibial vessel flow. Left lower extremity duplex demonstrates a patent left  common femoral to below-knee popliteal artery bypass without stenosis.  There is evidence of retrograde flow in the left ATA suggestive of possible occlusion The patient continues to mostly mobilizes via wheelchair due to chronic back pain.  He does walk very short distances, mostly around the household.  He denies any claudication, rest pain, or tissue loss. On exam he has monophasic left PT and peroneal Doppler signals.  He has monophasic right DP/PT Doppler signals He does describe some recent, intermittent shooting pains in his left lower leg and inner ankle.  This sounds likely neuropathic in nature given that it is not impacted by movement or body positioning.  This does not sound like rest pain.  He is also dealing with some phantom pains in his left great toe.  I have encouraged the patient to discuss possible nerve pain medications with his PCP. He can follow-up with our office in 1 year with repeat left lower extremity bypass graft duplex and ABIs   Loel Dubonnet PA-C [facility] Office: [phone]  Clinic MD: Karin Lieu

## 2023-10-26 ENCOUNTER — Other Ambulatory Visit (HOSPITAL_COMMUNITY): Payer: Self-pay

## 2023-10-27 ENCOUNTER — Other Ambulatory Visit (HOSPITAL_COMMUNITY): Payer: Self-pay

## 2023-10-27 MED ORDER — GLIMEPIRIDE 4 MG PO TABS
ORAL_TABLET | ORAL | 1 refills | Status: DC
  Filled 2023-10-27: qty 180, 90d supply, fill #0

## 2023-11-03 ENCOUNTER — Encounter (HOSPITAL_BASED_OUTPATIENT_CLINIC_OR_DEPARTMENT_OTHER): Payer: Self-pay | Admitting: Cardiovascular Disease

## 2023-11-04 ENCOUNTER — Other Ambulatory Visit: Payer: Self-pay

## 2023-11-04 DIAGNOSIS — I739 Peripheral vascular disease, unspecified: Secondary | ICD-10-CM

## 2023-11-19 ENCOUNTER — Other Ambulatory Visit: Payer: Self-pay

## 2023-11-19 ENCOUNTER — Emergency Department (HOSPITAL_COMMUNITY)
Admission: EM | Admit: 2023-11-19 | Discharge: 2023-11-21 | Disposition: A | Discharge status: Other Acute Inpatient Hospital | Payer: PPO | Attending: Emergency Medicine | Admitting: Emergency Medicine

## 2023-11-19 DIAGNOSIS — Z87891 Personal history of nicotine dependence: Secondary | ICD-10-CM | POA: Insufficient documentation

## 2023-11-19 DIAGNOSIS — R9431 Abnormal electrocardiogram [ECG] [EKG]: Secondary | ICD-10-CM | POA: Insufficient documentation

## 2023-11-19 DIAGNOSIS — F332 Major depressive disorder, recurrent severe without psychotic features: Secondary | ICD-10-CM | POA: Insufficient documentation

## 2023-11-19 DIAGNOSIS — E119 Type 2 diabetes mellitus without complications: Secondary | ICD-10-CM | POA: Insufficient documentation

## 2023-11-19 DIAGNOSIS — Z7984 Long term (current) use of oral hypoglycemic drugs: Secondary | ICD-10-CM | POA: Insufficient documentation

## 2023-11-19 DIAGNOSIS — F339 Major depressive disorder, recurrent, unspecified: Secondary | ICD-10-CM | POA: Diagnosis present

## 2023-11-19 DIAGNOSIS — K219 Gastro-esophageal reflux disease without esophagitis: Secondary | ICD-10-CM | POA: Insufficient documentation

## 2023-11-19 DIAGNOSIS — I10 Essential (primary) hypertension: Secondary | ICD-10-CM | POA: Insufficient documentation

## 2023-11-19 DIAGNOSIS — Z653 Problems related to other legal circumstances: Secondary | ICD-10-CM | POA: Diagnosis present

## 2023-11-19 DIAGNOSIS — Z7982 Long term (current) use of aspirin: Secondary | ICD-10-CM | POA: Insufficient documentation

## 2023-11-19 DIAGNOSIS — Z794 Long term (current) use of insulin: Secondary | ICD-10-CM | POA: Insufficient documentation

## 2023-11-19 DIAGNOSIS — R45851 Suicidal ideations: Secondary | ICD-10-CM | POA: Diagnosis not present

## 2023-11-19 DIAGNOSIS — F22 Delusional disorders: Secondary | ICD-10-CM | POA: Insufficient documentation

## 2023-11-19 DIAGNOSIS — F419 Anxiety disorder, unspecified: Secondary | ICD-10-CM | POA: Diagnosis not present

## 2023-11-19 DIAGNOSIS — I2510 Atherosclerotic heart disease of native coronary artery without angina pectoris: Secondary | ICD-10-CM | POA: Diagnosis not present

## 2023-11-19 LAB — CBC
HCT: 41.1 % (ref 39.0–52.0)
Hemoglobin: 13.3 g/dL (ref 13.0–17.0)
MCH: 27.9 pg (ref 26.0–34.0)
MCHC: 32.4 g/dL (ref 30.0–36.0)
MCV: 86.2 fL (ref 80.0–100.0)
Platelets: 217 10*3/uL (ref 150–400)
RBC: 4.77 MIL/uL (ref 4.22–5.81)
RDW: 14.9 % (ref 11.5–15.5)
WBC: 8.4 10*3/uL (ref 4.0–10.5)
nRBC: 0 % (ref 0.0–0.2)

## 2023-11-19 LAB — SALICYLATE LEVEL: Salicylate Lvl: <7 mg/dL — ABNORMAL LOW (ref 7.0–30.0)

## 2023-11-19 LAB — COMPREHENSIVE METABOLIC PANEL
ALT: 9 U/L (ref 0–44)
AST: 17 U/L (ref 15–41)
Albumin: 3.6 g/dL (ref 3.5–5.0)
Alkaline Phosphatase: 57 U/L (ref 38–126)
Anion gap: 11 (ref 5–15)
BUN: 19 mg/dL (ref 8–23)
CO2: 22 mmol/L (ref 22–32)
Calcium: 9.3 mg/dL (ref 8.9–10.3)
Chloride: 107 mmol/L (ref 98–111)
Creatinine, Ser: 1.26 mg/dL — ABNORMAL HIGH (ref 0.61–1.24)
GFR, Estimated: >60 mL/min (ref >60)
Glucose, Bld: 101 mg/dL — ABNORMAL HIGH (ref 70–99)
Potassium: 3.7 mmol/L (ref 3.5–5.1)
Sodium: 140 mmol/L (ref 135–145)
Total Bilirubin: 0.8 mg/dL (ref 0.0–1.2)
Total Protein: 7.3 g/dL (ref 6.5–8.1)

## 2023-11-19 LAB — ETHANOL: Alcohol, Ethyl (B): 18 mg/dL — ABNORMAL HIGH (ref <10)

## 2023-11-19 LAB — ACETAMINOPHEN LEVEL: Acetaminophen (Tylenol), Serum: <10 ug/mL — ABNORMAL LOW (ref 10–30)

## 2023-11-19 NOTE — ED Notes (Signed)
 Patient very agitated and refused VS.
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
## 2023-11-19 NOTE — ED Notes (Signed)
 Attempted vitals, pt stated that he did not want his vitals taken and to "go on somewhere else."

## 2023-11-20 ENCOUNTER — Encounter (HOSPITAL_COMMUNITY): Payer: Self-pay | Admitting: Psychiatric/Mental Health

## 2023-11-20 DIAGNOSIS — R45851 Suicidal ideations: Secondary | ICD-10-CM | POA: Diagnosis not present

## 2023-11-20 DIAGNOSIS — F332 Major depressive disorder, recurrent severe without psychotic features: Secondary | ICD-10-CM | POA: Diagnosis not present

## 2023-11-20 LAB — RAPID URINE DRUG SCREEN, HOSP PERFORMED
Amphetamines: NOT DETECTED
Barbiturates: NOT DETECTED
Benzodiazepines: NOT DETECTED
Cocaine: NOT DETECTED
Opiates: NOT DETECTED
Tetrahydrocannabinol: POSITIVE — AB

## 2023-11-20 LAB — RESP PANEL BY RT-PCR (RSV, FLU A&B, COVID)  RVPGX2
Influenza A by PCR: NEGATIVE
Influenza B by PCR: NEGATIVE
Resp Syncytial Virus by PCR: NEGATIVE
SARS Coronavirus 2 by RT PCR: NEGATIVE

## 2023-11-20 MED ORDER — NITROGLYCERIN 0.4 MG SL SUBL: 0.4000 mg | SUBLINGUAL_TABLET | SUBLINGUAL | Status: DC | PRN

## 2023-11-20 MED ORDER — GLIMEPIRIDE 4 MG PO TABS
4.0000 mg | ORAL_TABLET | Freq: Every day | ORAL | Status: DC
  Administered 2023-11-20: 4 mg via ORAL
  Filled 2023-11-20 (×2): qty 1

## 2023-11-20 MED ORDER — SERTRALINE HCL 50 MG PO TABS: 200.0000 mg | ORAL_TABLET | Freq: Every day | ORAL | Status: DC

## 2023-11-20 MED ORDER — POLYETHYLENE GLYCOL 3350 17 G PO PACK
17.0000 g | PACK | Freq: Every day | ORAL | Status: DC
Start: 2023-11-20 — End: 2023-11-25
  Filled 2023-11-20: qty 1

## 2023-11-20 MED ORDER — ROSUVASTATIN CALCIUM 5 MG PO TABS
5.0000 mg | ORAL_TABLET | Freq: Every day | ORAL | Status: DC
  Administered 2023-11-20 – 2023-11-21 (×2): 5 mg via ORAL
  Filled 2023-11-20 (×2): qty 1

## 2023-11-20 MED ORDER — OLANZAPINE 10 MG IM SOLR: 5.0000 mg | Freq: Once | INTRAMUSCULAR | Status: DC | PRN

## 2023-11-20 MED ORDER — ACETAMINOPHEN 325 MG PO TABS
650.0000 mg | ORAL_TABLET | Freq: Four times a day (QID) | ORAL | Status: DC | PRN
  Administered 2023-11-20: 650 mg via ORAL
  Filled 2023-11-20: qty 2

## 2023-11-20 MED ORDER — INSULIN DEGLUDEC 100 UNIT/ML ~~LOC~~ SOPN: 12.0000 [IU] | PEN_INJECTOR | Freq: Every day | SUBCUTANEOUS | Status: DC

## 2023-11-20 MED ORDER — OLANZAPINE 5 MG PO TBDP
5.0000 mg | ORAL_TABLET | Freq: Every day | ORAL | Status: DC
  Administered 2023-11-20: 5 mg via ORAL
  Filled 2023-11-20: qty 1

## 2023-11-20 MED ORDER — METOPROLOL SUCCINATE ER 50 MG PO TB24
50.0000 mg | ORAL_TABLET | Freq: Every day | ORAL | Status: DC
  Administered 2023-11-20 – 2023-11-21 (×2): 50 mg via ORAL
  Filled 2023-11-20 (×2): qty 1

## 2023-11-20 MED ORDER — METFORMIN HCL ER 500 MG PO TB24
500.0000 mg | ORAL_TABLET | Freq: Two times a day (BID) | ORAL | Status: DC
  Administered 2023-11-20 (×2): 500 mg via ORAL
  Filled 2023-11-20 (×4): qty 1

## 2023-11-20 MED ORDER — SERTRALINE HCL 50 MG PO TABS
100.0000 mg | ORAL_TABLET | Freq: Every day | ORAL | Status: DC
  Administered 2023-11-20 – 2023-11-21 (×2): 100 mg via ORAL
  Filled 2023-11-20 (×2): qty 2

## 2023-11-20 MED ORDER — INSULIN GLARGINE 100 UNIT/ML ~~LOC~~ SOLN
12.0000 [IU] | Freq: Every day | SUBCUTANEOUS | Status: DC
  Administered 2023-11-20: 12 [IU] via SUBCUTANEOUS
  Filled 2023-11-20: qty 0.12

## 2023-11-20 NOTE — Progress Notes (Signed)
 [facility] LCSW Progress Note   11/20/2023    4:20 PM  Justin Key   [id number]   Type of Contact and Topic:  Psychiatric Bed Placement   Pt accepted to [facility] GERO L35    Patient meets inpatient criteria per Alona Bene, [facility]   The attending provider will be Dr. Sandria Senter  Call report to [phone]  Darnelle Catalan, RN @ [facility] ED notified.     Pt scheduled  to arrive at [facility] for TODAY.    Damita Dunnings, MSW, LCSW-A  4:21 PM 11/20/2023
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
## 2023-11-20 NOTE — ED Provider Notes (Signed)
  Physical Exam  BP (!) 168/73   Pulse 66   Temp 98.2 F (36.8 C) (Oral)   Resp 18   Ht 6\' 3"  (1.905 m)   Wt 85 kg   SpO2 96%   BMI 23.42 kg/m   Physical Exam  Procedures  Procedures  ED Course / MDM    Medical Decision Making Amount and/or Complexity of Data Reviewed Labs: ordered.  Risk OTC drugs. Prescription drug management.   Accepted at [facility] by Dr. Marval Regal.  Reevaluated prior to transfer.       Benjiman Core, MD 11/20/23 1337  Patient apparently was not transferred yesterday.  Sheriff now on the way.  Reevaluated prior to this transfer.    Benjiman Core, MD 11/21/23 [phone]
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
## 2023-11-20 NOTE — BH Assessment (Signed)
 TTS consult will be completed by IRIS. IRIS Coordinator will communicate in this secure chat assessment time and provider name. Thanks

## 2023-11-20 NOTE — ED Notes (Signed)
 Pt ambulated to bathroom
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
## 2023-11-20 NOTE — ED Notes (Signed)
 Pt changed into burgundy scrubs. Belongings collected by tech. Clothing and black cell phone labeled and put in cabinet above 19-22 nurses station.
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
## 2023-11-20 NOTE — ED Notes (Signed)
 Report given to RN Amy at [facility]

## 2023-11-20 NOTE — ED Notes (Signed)
 Patient ambulated to room for privacy for TTS consult

## 2023-11-21 ENCOUNTER — Other Ambulatory Visit: Payer: Self-pay

## 2023-11-21 ENCOUNTER — Encounter: Payer: Self-pay | Admitting: Psychiatry

## 2023-11-21 ENCOUNTER — Inpatient Hospital Stay
Admission: AD | Admit: 2023-11-21 | Discharge: 2023-11-25 | DRG: 881 | Disposition: A | Discharge status: Home / Self Care | Source: Intra-hospital | Attending: Psychiatry | Admitting: Psychiatry

## 2023-11-21 DIAGNOSIS — Z7984 Long term (current) use of oral hypoglycemic drugs: Secondary | ICD-10-CM

## 2023-11-21 DIAGNOSIS — E785 Hyperlipidemia, unspecified: Secondary | ICD-10-CM | POA: Diagnosis present

## 2023-11-21 DIAGNOSIS — Z9841 Cataract extraction status, right eye: Secondary | ICD-10-CM | POA: Diagnosis not present

## 2023-11-21 DIAGNOSIS — K219 Gastro-esophageal reflux disease without esophagitis: Secondary | ICD-10-CM | POA: Diagnosis not present

## 2023-11-21 DIAGNOSIS — K2270 Barrett's esophagus without dysplasia: Secondary | ICD-10-CM | POA: Diagnosis not present

## 2023-11-21 DIAGNOSIS — Z961 Presence of intraocular lens: Secondary | ICD-10-CM | POA: Diagnosis not present

## 2023-11-21 DIAGNOSIS — I2510 Atherosclerotic heart disease of native coronary artery without angina pectoris: Secondary | ICD-10-CM | POA: Diagnosis not present

## 2023-11-21 DIAGNOSIS — Z87891 Personal history of nicotine dependence: Secondary | ICD-10-CM

## 2023-11-21 DIAGNOSIS — G8921 Chronic pain due to trauma: Secondary | ICD-10-CM | POA: Diagnosis not present

## 2023-11-21 DIAGNOSIS — M1990 Unspecified osteoarthritis, unspecified site: Secondary | ICD-10-CM | POA: Diagnosis not present

## 2023-11-21 DIAGNOSIS — Z6820 Body mass index (BMI) 20.0-20.9, adult: Secondary | ICD-10-CM

## 2023-11-21 DIAGNOSIS — Z9842 Cataract extraction status, left eye: Secondary | ICD-10-CM | POA: Diagnosis not present

## 2023-11-21 DIAGNOSIS — Z981 Arthrodesis status: Secondary | ICD-10-CM | POA: Diagnosis not present

## 2023-11-21 DIAGNOSIS — R45851 Suicidal ideations: Secondary | ICD-10-CM | POA: Diagnosis present

## 2023-11-21 DIAGNOSIS — F419 Anxiety disorder, unspecified: Secondary | ICD-10-CM | POA: Diagnosis not present

## 2023-11-21 DIAGNOSIS — Z9682 Presence of neurostimulator: Secondary | ICD-10-CM

## 2023-11-21 DIAGNOSIS — R630 Anorexia: Secondary | ICD-10-CM | POA: Diagnosis present

## 2023-11-21 DIAGNOSIS — Z794 Long term (current) use of insulin: Secondary | ICD-10-CM

## 2023-11-21 DIAGNOSIS — I252 Old myocardial infarction: Secondary | ICD-10-CM | POA: Diagnosis not present

## 2023-11-21 DIAGNOSIS — K3184 Gastroparesis: Secondary | ICD-10-CM | POA: Diagnosis present

## 2023-11-21 DIAGNOSIS — I10 Essential (primary) hypertension: Secondary | ICD-10-CM | POA: Diagnosis present

## 2023-11-21 DIAGNOSIS — F329 Major depressive disorder, single episode, unspecified: Secondary | ICD-10-CM | POA: Diagnosis not present

## 2023-11-21 DIAGNOSIS — Z7982 Long term (current) use of aspirin: Secondary | ICD-10-CM | POA: Diagnosis not present

## 2023-11-21 DIAGNOSIS — F332 Major depressive disorder, recurrent severe without psychotic features: Secondary | ICD-10-CM | POA: Diagnosis not present

## 2023-11-21 DIAGNOSIS — E1143 Type 2 diabetes mellitus with diabetic autonomic (poly)neuropathy: Secondary | ICD-10-CM | POA: Diagnosis present

## 2023-11-21 DIAGNOSIS — Z89412 Acquired absence of left great toe: Secondary | ICD-10-CM

## 2023-11-21 DIAGNOSIS — E1151 Type 2 diabetes mellitus with diabetic peripheral angiopathy without gangrene: Secondary | ICD-10-CM | POA: Diagnosis not present

## 2023-11-21 DIAGNOSIS — Z79899 Other long term (current) drug therapy: Secondary | ICD-10-CM | POA: Diagnosis not present

## 2023-11-21 LAB — GLUCOSE, CAPILLARY: Glucose-Capillary: 134 mg/dL — ABNORMAL HIGH (ref 70–99)

## 2023-11-21 MED ORDER — INSULIN GLARGINE 100 UNIT/ML ~~LOC~~ SOLN
12.0000 [IU] | Freq: Every day | SUBCUTANEOUS | Status: DC
  Administered 2023-11-21 – 2023-11-24 (×4): 12 [IU] via SUBCUTANEOUS
  Filled 2023-11-21 (×5): qty 0.12

## 2023-11-21 MED ORDER — HALOPERIDOL LACTATE 5 MG/ML IJ SOLN: 5.0000 mg | Freq: Three times a day (TID) | INTRAMUSCULAR | Status: DC | PRN

## 2023-11-21 MED ORDER — LORAZEPAM 2 MG/ML IJ SOLN: 2.0000 mg | Freq: Three times a day (TID) | INTRAMUSCULAR | Status: DC | PRN

## 2023-11-21 MED ORDER — ALUM & MAG HYDROXIDE-SIMETH 200-200-20 MG/5ML PO SUSP
30.0000 mL | ORAL | Status: DC | PRN
Start: 2023-11-21 — End: 2023-11-25

## 2023-11-21 MED ORDER — DIPHENHYDRAMINE HCL 25 MG PO CAPS: 50.0000 mg | ORAL_CAPSULE | Freq: Three times a day (TID) | ORAL | Status: DC | PRN

## 2023-11-21 MED ORDER — SERTRALINE HCL 50 MG PO TABS
100.0000 mg | ORAL_TABLET | Freq: Every day | ORAL | Status: DC
  Administered 2023-11-22 – 2023-11-24 (×3): 100 mg via ORAL
  Filled 2023-11-21 (×3): qty 2

## 2023-11-21 MED ORDER — DIPHENHYDRAMINE HCL 50 MG/ML IJ SOLN: 50.0000 mg | Freq: Three times a day (TID) | INTRAMUSCULAR | Status: DC | PRN

## 2023-11-21 MED ORDER — MAGNESIUM HYDROXIDE 400 MG/5ML PO SUSP: 30.0000 mL | Freq: Every day | ORAL | Status: DC | PRN

## 2023-11-21 MED ORDER — GLIMEPIRIDE 4 MG PO TABS
4.0000 mg | ORAL_TABLET | Freq: Every day | ORAL | Status: DC
  Administered 2023-11-22 – 2023-11-25 (×4): 4 mg via ORAL
  Filled 2023-11-21 (×4): qty 1

## 2023-11-21 MED ORDER — HALOPERIDOL LACTATE 5 MG/ML IJ SOLN: 10.0000 mg | Freq: Three times a day (TID) | INTRAMUSCULAR | Status: DC | PRN

## 2023-11-21 MED ORDER — ASPIRIN 81 MG PO TBEC
81.0000 mg | DELAYED_RELEASE_TABLET | Freq: Every day | ORAL | Status: DC
  Administered 2023-11-21 – 2023-11-25 (×5): 81 mg via ORAL
  Filled 2023-11-21 (×6): qty 1

## 2023-11-21 MED ORDER — TRAZODONE HCL 50 MG PO TABS
50.0000 mg | ORAL_TABLET | Freq: Every evening | ORAL | Status: DC | PRN
  Administered 2023-11-21 – 2023-11-24 (×4): 50 mg via ORAL
  Filled 2023-11-21 (×4): qty 1

## 2023-11-21 MED ORDER — ACETAMINOPHEN 325 MG PO TABS
650.0000 mg | ORAL_TABLET | Freq: Four times a day (QID) | ORAL | Status: DC | PRN
  Administered 2023-11-21 – 2023-11-25 (×8): 650 mg via ORAL
  Filled 2023-11-21 (×9): qty 2

## 2023-11-21 MED ORDER — NITROGLYCERIN 0.4 MG SL SUBL: 0.4000 mg | SUBLINGUAL_TABLET | SUBLINGUAL | Status: DC | PRN

## 2023-11-21 MED ORDER — METFORMIN HCL ER 500 MG PO TB24
500.0000 mg | ORAL_TABLET | Freq: Two times a day (BID) | ORAL | Status: DC
  Administered 2023-11-21 – 2023-11-25 (×8): 500 mg via ORAL
  Filled 2023-11-21 (×9): qty 1

## 2023-11-21 MED ORDER — ROSUVASTATIN CALCIUM 10 MG PO TABS
5.0000 mg | ORAL_TABLET | Freq: Every day | ORAL | Status: DC
  Administered 2023-11-22 – 2023-11-25 (×4): 5 mg via ORAL
  Filled 2023-11-21 (×4): qty 1

## 2023-11-21 MED ORDER — POLYETHYLENE GLYCOL 3350 17 G PO PACK
17.0000 g | PACK | Freq: Every day | ORAL | Status: DC | PRN
  Administered 2023-11-21 – 2023-11-24 (×3): 17 g via ORAL
  Filled 2023-11-21 (×4): qty 1

## 2023-11-21 MED ORDER — HALOPERIDOL 5 MG PO TABS: 5.0000 mg | ORAL_TABLET | Freq: Three times a day (TID) | ORAL | Status: DC | PRN
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
## 2023-11-21 NOTE — Plan of Care (Signed)
  Problem: Education: Goal: Knowledge of [facility] Education information/materials will improve Outcome: Not Progressing Goal: Emotional status will improve Outcome: Not Progressing Goal: Mental status will improve Outcome: Not Progressing Goal: Verbalization of understanding the information provided will improve Outcome: Not Progressing   Problem: Education: Goal: Ability to state activities that reduce stress will improve Outcome: Not Progressing   Problem: Education: Goal: Utilization of techniques to improve thought processes will improve Outcome: Not Progressing Goal: Knowledge of the prescribed therapeutic regimen will improve Outcome: Not Progressing

## 2023-11-21 NOTE — Tx Team (Signed)
 Initial Treatment Plan 11/21/2023 11:22 AM [name] [id number]    PATIENT STRESSORS: Marital or family conflict     PATIENT STRENGTHS: Ability for insight  Communication skills  Supportive family/friends    PATIENT IDENTIFIED PROBLEMS:   "I need to communicate better with my wife."                   DISCHARGE CRITERIA:  Ability to meet basic life and health needs Adequate post-discharge living arrangements Improved stabilization in mood, thinking, and/or behavior Reduction of life-threatening or endangering symptoms to within safe limits Safe-care adequate arrangements made  PRELIMINARY DISCHARGE PLAN: Return to previous work or school arrangements  PATIENT/FAMILY INVOLVEMENT: This treatment plan has been presented to and reviewed with the patient, Justin Key. The patient has been given the opportunity to ask questions and make suggestions.  Luane School, RN 11/21/2023, 11:22 AM

## 2023-11-21 NOTE — Group Note (Signed)
 LCSW Group Therapy Note   Group Date: 11/21/2023 Start Time: 1300 End Time: 1400   Type of Therapy and Topic:  Group Therapy:   Participation Level:  Did Not Attend  Description of Group:   Therapeutic Goals:  1.     Summary of Patient Progress:    Patient was just admitted  Therapeutic Modalities:   Alanda Amass 11/21/2023  1:54 PM

## 2023-11-22 DIAGNOSIS — G8921 Chronic pain due to trauma: Secondary | ICD-10-CM | POA: Diagnosis not present

## 2023-11-22 DIAGNOSIS — F332 Major depressive disorder, recurrent severe without psychotic features: Secondary | ICD-10-CM

## 2023-11-22 LAB — GLUCOSE, CAPILLARY: Glucose-Capillary: 145 mg/dL — ABNORMAL HIGH (ref 70–99)

## 2023-11-22 MED ORDER — IBUPROFEN 200 MG PO TABS
600.0000 mg | ORAL_TABLET | Freq: Four times a day (QID) | ORAL | Status: DC | PRN
  Administered 2023-11-22 – 2023-11-23 (×2): 600 mg via ORAL
  Filled 2023-11-22 (×2): qty 3

## 2023-11-22 MED ORDER — ARIPIPRAZOLE 2 MG PO TABS
2.0000 mg | ORAL_TABLET | Freq: Every day | ORAL | Status: DC
  Administered 2023-11-22 – 2023-11-25 (×4): 2 mg via ORAL
  Filled 2023-11-22 (×4): qty 1
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
## 2023-11-22 NOTE — Progress Notes (Signed)
 Pt visited by wife, reported that wife is supportive.  Denied SI/HI, AVH, depression and anxiety rated mild to moderate.  Pt 's wife brought in a back brace that he uses at home; pt would like to use the brace for back support while in the hospital.    11/22/23 0000  Psych Admission Type (Psych Patients Only)  Admission Status Involuntary  Psychosocial Assessment  Patient Complaints None  Eye Contact Fair  Facial Expression Flat  Affect Depressed  Speech Soft  Interaction Minimal;Assertive  Motor Activity Unsteady  Appearance/Hygiene Unremarkable  Behavior Characteristics Cooperative  Mood Pleasant  Aggressive Behavior  Effect No apparent injury  Thought Process  Coherency WDL  Content Preoccupation  Delusions None reported or observed  Perception WDL  Hallucination None reported or observed  Judgment Limited  Confusion None  Danger to Self  Current suicidal ideation? Denies  Agreement Not to Harm Self Yes  Description of Agreement Verbal  Danger to Others  Danger to Others None reported or observed

## 2023-11-22 NOTE — Group Note (Deleted)
 Recreation Therapy Group Note   Group Topic:Goal Setting  Group Date: 11/22/2023 Start Time: 1040 End Time: 1140 Facilitators: Rosina Lowenstein, LRT Location:  Craft Room  Group Description: Product/process development scientist. Patients were given many different magazines, a glue stick, markers, and a piece of cardstock paper. LRT and pts discussed the importance of having goals in life. LRT and pts discussed the difference between short-term and long-term goals, as well as what a SMART goal is. LRT encouraged pts to create a vision board, with images they picked and then cut out with safety scissors from the magazine, for themselves, that capture their short and long-term goals. LRT encouraged pts to show and explain their vision board to the group.   Goal Area(s) Addressed:  Patient will gain knowledge of short vs. long term goals.  Patient will identify goals for themselves. Patient will practice setting SMART goals. Patient will verbalize their goals to LRT and peers.       Affect/Mood: {RT BHH Affect/Mood:26271}   Participation Level: {RT BHH Participation Level:26267}   Participation Quality: {RT BHH Participation Quality:26268}   Behavior: {RT BHH Group Behavior:26269}   Speech/Thought Process: {RT BHH Speech/Thought:26276}   Insight: {RT BHH Insight:26272}   Judgement: {RT BHH Judgement:26278}   Modes of Intervention: {RT BHH Modes of Intervention:26277}   Patient Response to Interventions:  {RT BHH Patient Response to Intervention:26274}   Education Outcome:  {RT BHH Education Outcome:26279}   Clinical Observations/Individualized Feedback: *** was *** in their participation of session activities and group discussion. Pt identified ***   Plan: {RT BHH Tx VHQI:69629}   Rosina Lowenstein, LRT,  11/22/2023 12:58 PM

## 2023-11-22 NOTE — BH IP Treatment Plan (Signed)
 Interdisciplinary Treatment and Diagnostic Plan Update  11/22/2023 Time of Session: 3:30 PM  CLEARANCE CHENAULT MRN: [id number]  Principal Diagnosis: MDD (major depressive disorder)  Secondary Diagnoses: Principal Problem:   MDD (major depressive disorder) Active Problems:   Chronic pain due to trauma   Current Medications:  Current Facility-Administered Medications  Medication Dose Route Frequency Provider Last Rate Last Admin   acetaminophen (TYLENOL) tablet 650 mg  650 mg Oral Q6H PRN Motley-Mangrum, Jadeka A, PMHNP   650 mg at 11/22/23 1351   alum & mag hydroxide-simeth (MAALOX/MYLANTA) 200-200-20 MG/5ML suspension 30 mL  30 mL Oral Q4H PRN Motley-Mangrum, Jadeka A, PMHNP       ARIPiprazole (ABILIFY) tablet 2 mg  2 mg Oral Daily Lewanda Rife, MD   2 mg at 11/22/23 1349   aspirin EC tablet 81 mg  81 mg Oral Daily Motley-Mangrum, Jadeka A, PMHNP   81 mg at 11/22/23 0805   haloperidol (HALDOL) tablet 5 mg  5 mg Oral TID PRN Motley-Mangrum, Jadeka A, PMHNP       And   diphenhydrAMINE (BENADRYL) capsule 50 mg  50 mg Oral TID PRN Motley-Mangrum, Jadeka A, PMHNP       haloperidol lactate (HALDOL) injection 5 mg  5 mg Intramuscular TID PRN Motley-Mangrum, Jadeka A, PMHNP       And   diphenhydrAMINE (BENADRYL) injection 50 mg  50 mg Intramuscular TID PRN Motley-Mangrum, Jadeka A, PMHNP       And   LORazepam (ATIVAN) injection 2 mg  2 mg Intramuscular TID PRN Motley-Mangrum, Jadeka A, PMHNP       haloperidol lactate (HALDOL) injection 10 mg  10 mg Intramuscular TID PRN Motley-Mangrum, Jadeka A, PMHNP       And   diphenhydrAMINE (BENADRYL) injection 50 mg  50 mg Intramuscular TID PRN Motley-Mangrum, Jadeka A, PMHNP       And   LORazepam (ATIVAN) injection 2 mg  2 mg Intramuscular TID PRN Motley-Mangrum, Jadeka A, PMHNP       glimepiride (AMARYL) tablet 4 mg  4 mg Oral Q breakfast Motley-Mangrum, Jadeka A, PMHNP   4 mg at 11/22/23 0805   insulin glargine (LANTUS) injection 12 Units  12  Units Subcutaneous QHS Motley-Mangrum, Jadeka A, PMHNP   12 Units at 11/21/23 2116   magnesium hydroxide (MILK OF MAGNESIA) suspension 30 mL  30 mL Oral Daily PRN Motley-Mangrum, Jadeka A, PMHNP       metFORMIN (GLUCOPHAGE-XR) 24 hr tablet 500 mg  500 mg Oral BID WC Motley-Mangrum, Jadeka A, PMHNP   500 mg at 11/22/23 0805   nitroGLYCERIN (NITROSTAT) SL tablet 0.4 mg  0.4 mg Sublingual Q5 min PRN Motley-Mangrum, Jadeka A, PMHNP       polyethylene glycol (MIRALAX / GLYCOLAX) packet 17 g  17 g Oral Daily PRN Lewanda Rife, MD   17 g at 11/21/23 1254   rosuvastatin (CRESTOR) tablet 5 mg  5 mg Oral Daily Motley-Mangrum, Jadeka A, PMHNP   5 mg at 11/22/23 0804   sertraline (ZOLOFT) tablet 100 mg  100 mg Oral Daily Motley-Mangrum, Jadeka A, PMHNP   100 mg at 11/22/23 0759   traZODone (DESYREL) tablet 50 mg  50 mg Oral QHS PRN Motley-Mangrum, Jadeka A, PMHNP   50 mg at 11/21/23 2114   PTA Medications: Medications Prior to Admission  Medication Sig Dispense Refill Last Dose/Taking   aspirin EC 81 MG tablet Take 1 tablet (81 mg total) by mouth daily. 90 tablet 3    Cholecalciferol (  VITAMIN D3) 1000 units CAPS Take 1,000 Units by mouth daily after breakfast.      docusate sodium (COLACE) 100 MG capsule Take 1 capsule (100 mg total) by mouth every 12 (twelve) hours. (Patient not taking: Reported on 11/20/2023) 180 capsule 0    glimepiride (AMARYL) 4 MG tablet Take 2 tablets (8 mg total) by mouth daily. 180 tablet 1    glucose blood (FREESTYLE LITE) test strip Use to check blood glucose 2 times daily 200 strip 4    glucose blood test strip use 1 strip to check blood glucose 2 (two) times daily. 200 each 3    glucose blood test strip Use to check blood sugar 2 times a day 200 each 3    ibuprofen (ADVIL,MOTRIN) 200 MG tablet Take 600-800 mg by mouth every 8 (eight) hours as needed for moderate pain.       insulin aspart (NOVOLOG FLEXPEN) 100 UNIT/ML FlexPen Inject 2 - 5 Units into the skin 3 times daily  before meals (Patient not taking: Reported on 11/20/2023) 3 mL 5    insulin degludec (TRESIBA FLEXTOUCH) 100 UNIT/ML FlexTouch Pen Inject 10 Units into the skin every evening. (Patient taking differently: Inject 12 Units into the skin at bedtime.) 9 mL 3    insulin degludec (TRESIBA FLEXTOUCH) 100 UNIT/ML FlexTouch Pen Inject 12 Units into the skin every evening. (Patient not taking: Reported on 11/20/2023) 12 mL 1    Insulin Pen Needle 32G X 4 MM MISC Use once a day as directed. 100 each 6    lidocaine (LIDODERM) 5 % Apply 2 patches to affected area daily for up to 12 hours.  Remove for 12 hours & repeat as directed. (Patient taking differently: Place 1 patch onto the skin daily as needed (pain).) 60 patch 11    metFORMIN (GLUCOPHAGE-XR) 500 MG 24 hr tablet Take 1 tablet (500 mg total) by mouth 2 (two) times daily. 60 tablet 6    metoCLOPramide (REGLAN) 5 MG tablet Take 1 tablet (5 mg total) by mouth 2 (two) times daily before a meal as needed. (Patient taking differently: Take 5 mg by mouth 2 (two) times daily as needed (to relieve symptoms such as fullness, nausea, and heartburn).) 60 tablet 0    metoprolol succinate (TOPROL-XL) 50 MG 24 hr tablet Take 1 tablet (50 mg total) by mouth daily. (discontinue 25 mg RX) 90 tablet 3    nitroGLYCERIN (NITROSTAT) 0.4 MG SL tablet Place 1 tablet (0.4 mg total) under the tongue every 5 (five) minutes as needed for chest pain. If no relief after 3 doses call 911 25 tablet 3    pantoprazole (PROTONIX) 40 MG tablet Take 1 tablet (40 mg total) by mouth 2 (two) times daily. 180 tablet 0    polyethylene glycol powder (MIRALAX) 17 GM/SCOOP powder Take 255 g by mouth daily as needed for moderate constipation or severe constipation. (Patient taking differently: Take 17 g by mouth daily as needed for mild constipation.) 510 g 0    rosuvastatin (CRESTOR) 5 MG tablet Take 1 tablet (5 mg total) by mouth daily. 30 tablet 11    sertraline (ZOLOFT) 100 MG tablet Take 1 tablet (100  mg total) by mouth daily. (Patient not taking: Reported on 11/20/2023) 90 tablet 3    sertraline (ZOLOFT) 100 MG tablet Take 2 tablets (200 mg total) by mouth daily. (Patient taking differently: Take 200 mg by mouth at bedtime.) 180 tablet 0    sildenafil (REVATIO) 20 MG tablet Take 1 -  5 tablets by mouth as needed as instructed (Patient not taking: Reported on 11/20/2023) 90 tablet 1    sildenafil (REVATIO) 20 MG tablet Take 1-5 tablets (20-100 mg total) by mouth as needed as instructed. (Patient taking differently: Take 20-100 mg by mouth as needed (as directed- for ED).) 90 tablet 3    sitaGLIPtin (JANUVIA) 100 MG tablet Take 1 tablet (100 mg total) by mouth daily. 90 tablet 4    TYLENOL 500 MG tablet Take 500-1,000 mg by mouth every 6 (six) hours as needed for mild pain (pain score 1-3) or headache.       Patient Stressors: Marital or family conflict    Patient Strengths: Ability for insight  Communication skills  Supportive family/friends   Treatment Modalities: Medication Management, Group therapy, Case management,  1 to 1 session with clinician, Psychoeducation, Recreational therapy.   Physician Treatment Plan for Primary Diagnosis: MDD (major depressive disorder) Long Term Goal(s): Improvement in symptoms so as ready for discharge   Short Term Goals: Ability to identify changes in lifestyle to reduce recurrence of condition will improve Ability to verbalize feelings will improve Ability to disclose and discuss suicidal ideas Ability to demonstrate self-control will improve Ability to identify and develop effective coping behaviors will improve Ability to maintain clinical measurements within normal limits will improve Compliance with prescribed medications will improve  Medication Management: Evaluate patient's response, side effects, and tolerance of medication regimen.  Therapeutic Interventions: 1 to 1 sessions, Unit Group sessions and Medication administration.  Evaluation of  Outcomes: Not Progressing  Physician Treatment Plan for Secondary Diagnosis: Principal Problem:   MDD (major depressive disorder) Active Problems:   Chronic pain due to trauma  Long Term Goal(s): Improvement in symptoms so as ready for discharge   Short Term Goals: Ability to identify changes in lifestyle to reduce recurrence of condition will improve Ability to verbalize feelings will improve Ability to disclose and discuss suicidal ideas Ability to demonstrate self-control will improve Ability to identify and develop effective coping behaviors will improve Ability to maintain clinical measurements within normal limits will improve Compliance with prescribed medications will improve     Medication Management: Evaluate patient's response, side effects, and tolerance of medication regimen.  Therapeutic Interventions: 1 to 1 sessions, Unit Group sessions and Medication administration.  Evaluation of Outcomes: Not Progressing   RN Treatment Plan for Primary Diagnosis: MDD (major depressive disorder) Long Term Goal(s): Knowledge of disease and therapeutic regimen to maintain health will improve  Short Term Goals: Ability to remain free from injury will improve, Ability to verbalize frustration and anger appropriately will improve, Ability to demonstrate self-control, Ability to participate in decision making will improve, Ability to verbalize feelings will improve, Ability to disclose and discuss suicidal ideas, Ability to identify and develop effective coping behaviors will improve, and Compliance with prescribed medications will improve  Medication Management: RN will administer medications as ordered by provider, will assess and evaluate patient's response and provide education to patient for prescribed medication. RN will report any adverse and/or side effects to prescribing provider.  Therapeutic Interventions: 1 on 1 counseling sessions, Psychoeducation, Medication administration,  Evaluate responses to treatment, Monitor vital signs and CBGs as ordered, Perform/monitor CIWA, COWS, AIMS and Fall Risk screenings as ordered, Perform wound care treatments as ordered.  Evaluation of Outcomes: Not Progressing   LCSW Treatment Plan for Primary Diagnosis: MDD (major depressive disorder) Long Term Goal(s): Safe transition to appropriate next level of care at discharge, Engage patient in therapeutic group addressing  interpersonal concerns.  Short Term Goals: Engage patient in aftercare planning with referrals and resources, Increase social support, Increase ability to appropriately verbalize feelings, Increase emotional regulation, Facilitate acceptance of mental health diagnosis and concerns, Facilitate patient progression through stages of change regarding substance use diagnoses and concerns, Identify triggers associated with mental health/substance abuse issues, and Increase skills for wellness and recovery  Therapeutic Interventions: Assess for all discharge needs, 1 to 1 time with Social worker, Explore available resources and support systems, Assess for adequacy in community support network, Educate family and significant other(s) on suicide prevention, Complete Psychosocial Assessment, Interpersonal group therapy.  Evaluation of Outcomes: Not Progressing   Progress in Treatment: Attending groups: No. Participating in groups: No. Taking medication as prescribed: Yes. Toleration medication: Yes. Family/Significant other contact made: No, will contact:  CSW will contact if given permission Patient understands diagnosis: Yes. Discussing patient identified problems/goals with staff: Yes. Medical problems stabilized or resolved: Yes. Denies suicidal/homicidal ideation: Yes. Issues/concerns per patient self-inventory: No. Other: None   New problem(s) identified: No, Describe:  None identified   New Short Term/Long Term Goal(s):elimination of symptoms of psychosis,  medication management for mood stabilization; elimination of SI thoughts; development of comprehensive mental wellness plan.   Patient Goals:  "Leave the hospital"   Discharge Plan or Barriers: CSW will assist with appropriate discharge planning   Reason for Continuation of Hospitalization: Depression Medication stabilization  Estimated Length of Stay: 1 to 7 days   Last 3 [place] Suicide Severity Risk Score: Flowsheet Row Admission (Current) from 11/21/2023 in [facility] ED from 11/19/2023 in [facility] Emergency Department at [facility] ED from 06/18/2023 in [facility] Emergency Department at [facility]  C-SSRS RISK CATEGORY High Risk High Risk No Risk       Last [facility] 2/9 Scores:     No data to display          Scribe for Treatment Team: Elza Rafter, Theresia Majors 11/22/2023 4:07 PM

## 2023-11-22 NOTE — BHH Suicide Risk Assessment (Signed)
 [facility] Admission Suicide Risk Assessment   Nursing information obtained from:  Patient Demographic factors:  Male, Age 73 or older, Unemployed, Access to firearms Loss Factors:  Decline in physical health Risk Reduction Factors:  Sense of responsibility to family, Living with another person, especially a relative, Positive social support  Principal Problem: MDD (major depressive disorder) Diagnosis:  Principal Problem:   MDD (major depressive disorder)  Subjective Data:  The patient is a 73yo male who presented to the emergency department, via [facility], under IVC petition. IVC petitioned filed by wife. Per IVC, respondent stated that he wants to die, that he's ready to die. Respondent had a gun laying on his chest with it pointed under his chin. Respondent has repeatedly threatened to kill himself and his wife. Without intervention the respondent could harm himself and others.   Patient perseverates on his marital issues with his wife. States they have been married for 42 years and early in their marriage he found out that his wife had been having an affair with another male individual in [place].    Alcohol Use Disorder Identification Test Final Score (AUDIT): 1 The "Alcohol Use Disorders Identification Test", Guidelines for Use in Primary Care, Second Edition.  World Science writer [facility]). Score between 0-7:  no or low risk or alcohol related problems. Score between 8-15:  moderate risk of alcohol related problems. Score between 16-19:  high risk of alcohol related problems. Score 20 or above:  warrants further diagnostic evaluation for alcohol dependence and treatment.   CLINICAL FACTORS:   Depression:   Anhedonia Insomnia Severe Previous Psychiatric Diagnoses and Treatments   Musculoskeletal: Strength & Muscle Tone: within normal limits Gait & Station:  Uses walker Patient leans: N/A  Psychiatric Specialty Exam:  Presentation  General Appearance:  Appropriate for  Environment  Eye Contact: Fair  Speech: Clear and Coherent  Speech Volume: Decreased    Mood and Affect  Mood: Depressed  Affect: Constricted and tearful   Thought Process  Thought Processes: Coherent  Descriptions of Associations:Intact  Orientation:Full (Time, Place and Person)  Thought Content:Abstract Reasoning  History of Schizophrenia/Schizoaffective disorder:No  Duration of Psychotic Symptoms:NA Hallucinations:Hallucinations: None  Ideas of Reference:None  Suicidal Thoughts:Suicidal Thoughts: Yes, Active SI Active Intent and/or Plan: Without Intent  Homicidal Thoughts:Homicidal Thoughts: No   Sensorium  Memory: Recent Fair; Remote Fair; Immediate Fair  Judgment: Impaired  Insight: Shallow   Executive Functions  Concentration: Fair  Attention Span: Fair  Recall: [employer] of Knowledge: Fair  Language: Fair   Psychomotor Activity  Psychomotor Activity: Psychomotor Activity: Decreased   Assets  Assets: Manufacturing systems engineer; Housing; Resilience   Sleep  Sleep: Sleep: Poor    Physical Exam: Physical Exam Constitutional:      Appearance: Normal appearance.  HENT:     Head: Atraumatic.     Nose: No congestion.  Eyes:     Conjunctiva/sclera: Conjunctivae normal.  Cardiovascular:     Rate and Rhythm: Regular rhythm.  Pulmonary:     Effort: Pulmonary effort is normal. No respiratory distress.  Skin:    General: Skin is warm.  Neurological:     Mental Status: He is alert and oriented to person, place, and time.    Review of Systems  Constitutional:  Negative for chills and fever.  HENT:  Negative for congestion and sore throat.   Eyes:  Negative for blurred vision and double vision.  Respiratory:  Negative for cough and shortness of breath.   Cardiovascular:  Negative for  chest pain and palpitations.  Gastrointestinal:  Negative for nausea and vomiting.  Neurological:  Negative for dizziness and speech  change.  Psychiatric/Behavioral:  Positive for depression and suicidal ideas. The patient has insomnia.    Blood pressure (!) 128/95, pulse 67, temperature 97.8 F (36.6 C), resp. rate 18, height 6\' 3"  (1.905 m), weight 75.1 kg, SpO2 99%. Body mass index is 20.69 kg/m.   COGNITIVE FEATURES THAT CONTRIBUTE TO RISK:  Closed-mindedness and Thought constriction (tunnel vision)    SUICIDE RISK:   Severe:  Frequent, intense, and enduring suicidal ideation, specific plan, no subjective intent, but some objective markers of intent (i.e., choice of lethal method), the method is accessible,   PLAN OF CARE: Per H&P  I certify that inpatient services furnished can reasonably be expected to improve the patient's condition.   Lewanda Rife, MD 11/22/2023, 9:30 AM

## 2023-11-22 NOTE — H&P (Signed)
 Psychiatric Admission Assessment Adult  Patient Identification: Justin Key MRN:  [id number] Date of Evaluation:  11/22/2023 Chief Complaint:  MDD (major depressive disorder) [F32.9] Principal Diagnosis: MDD (major depressive disorder) Diagnosis:  Principal Problem:   MDD (major depressive disorder)  History of Present Illness: The patient is a 73yo male who presented to the emergency department, via [facility], under IVC petition. IVC petitioned filed by wife. Per IVC, respondent stated that he wants to die, that he's ready to die. Respondent had a gun laying on his chest with it pointed under his chin. Respondent has repeatedly threatened to kill himself and his wife. Without intervention the respondent could harm himself and others.  Patient perseverates on his marital issues with his wife. States they have been married for 42 years and early in their marriage he found out that his wife had been having an affair with another male individual in [place].   Chart reviewed, case discussed in multidisciplinary meeting, patient seen during rounds and in treatment team meeting in the presence of social worker and Charity fundraiser.  Patient was tearful during the session.  Patient reports depressed mood, anhedonia, but he reports he feels hopeful.  Patient talked about chronic pain.  He reports that he is tired of living with this.  Pain every day.  He reports he does not want OxyContin or opiates because he has been through 2 rounds of withdrawal from opiates in the past.  Patient was in need of constant reassurance and support.  Patient was encouraged to follow-up with pain clinic upon discharge to discuss the treatment options.  Patient reports her sleep and appetite has been poor.  No psychotic or manic symptoms reported by the patient.  Patient reports that Zoloft has stopped working for him, he said" I have been taking this medicine for so long it does not work anymore".  Patient agrees to try Abilify to augment the  effect of Zoloft.  Side effect of Abilify including metabolic syndrome discussed with the patient.  Patient agrees with the plan.  Patient denies any intention to harm himself on the unit.   Past Psychiatric History: History of Depression and IVC 05/2023  PCP- [name] managing Zoloft 100mg  po daily; recently increased   No prior psychiatric hospitalizations   Is the patient at risk to self? Yes.    Has the patient been a risk to self in the past 6 months? Yes.    Has the patient been a risk to self within the distant past? No.  Is the patient a risk to others? No.  Has the patient been a risk to others in the past 6 months? No.  Has the patient been a risk to others within the distant past? No.   [place] Scale:  Flowsheet Row Admission (Current) from 11/21/2023 in [facility] BEHAVIORAL [facility]IC[facility] ED from 11/19/2023 in [facility] Emergency Department at [facility] ED from 06/18/2023 in [facility] Emergency Department at [facility]  C-SSRS RISK CATEGORY High Risk High Risk No Risk        Prior Inpatient Therapy: No.   Prior Outpatient Therapy: Yes.     Alcohol Screening: 1. How often do you have a drink containing alcohol?: Monthly or less 2. How many drinks containing alcohol do you have on a typical day when you are drinking?: 1 or 2 3. How often do you have six or more drinks on one occasion?: Never AUDIT-C Score: 1 4. How often during the last year  have you found that you were not able to stop drinking once you had started?: Never 5. How often during the last year have you failed to do what was normally expected from you because of drinking?: Never 6. How often during the last year have you needed a first drink in the morning to get yourself going after a heavy drinking session?: Never 7. How often during the last year have you had a feeling of guilt of remorse after drinking?: Never 8. How often during the last year have you been unable to remember what  happened the night before because you had been drinking?: Never 9. Have you or someone else been injured as a result of your drinking?: No 10. Has a relative or friend or a doctor or another health worker been concerned about your drinking or suggested you cut down?: No Alcohol Use Disorder Identification Test Final Score (AUDIT): 1 Alcohol Brief Interventions/Follow-up: Alcohol education/Brief advice Substance Abuse History in the last 12 months:   Patient reports occasional use of alcohol. UDS positive for cannabis   Previous Psychotropic Medications: Yes   Past Medical History:  Past Medical History:  Diagnosis Date   Adenomatous polyps    Anxiety    Arthritis    Back pain    Barrett's esophagus    Coronary artery disease    Depression    Diabetes mellitus without complication (HCC)    Failure to thrive in adult    Gastroparesis    GERD (gastroesophageal reflux disease)    Hyperlipidemia    Hypertension    Lumbar disc disease    Lumbar spinal stenosis    Myocardial infarction [facility]) 1997   Peripheral arterial disease (HCC)    Peripheral vascular disease (HCC)    Thoracic myelopathy 2020    Past Surgical History:  Procedure Laterality Date   ABDOMINAL AORTOGRAM W/LOWER EXTREMITY Left 05/15/2022   Procedure: ABDOMINAL AORTOGRAM W/LOWER EXTREMITY;  Surgeon: Chuck Hint, MD;  Location: [facility] INVASIVE CV LAB;  Service: Cardiovascular;  Laterality: Left;   AMPUTATION TOE Left 08/21/2022   Procedure: AMPUTATION TOE INTERPHALANGEAL JOINT BIG TOE;  Surgeon: Vivi Barrack, DPM;  Location: [facility] ORS;  Service: Podiatry;  Laterality: Left;   BACK SURGERY     microdiskectomy x2   BIOPSY  06/21/2018   Procedure: BIOPSY;  Surgeon: Charlott Rakes, MD;  Location: [facility] ENDOSCOPY;  Service: Endoscopy;;   BIOPSY  02/06/2020   Procedure: BIOPSY;  Surgeon: Charlott Rakes, MD;  Location: [facility] ENDOSCOPY;  Service: Endoscopy;;   CATARACT EXTRACTION W/ INTRAOCULAR LENS IMPLANT  Bilateral    COLONOSCOPY  07/12/2012   Procedure: COLONOSCOPY;  Surgeon: Shirley Friar, MD;  Location: [facility] ENDOSCOPY;  Service: Endoscopy;  Laterality: N/A;   COLONOSCOPY WITH PROPOFOL N/A 06/21/2018   Procedure: COLONOSCOPY WITH PROPOFOL;  Surgeon: Charlott Rakes, MD;  Location: [facility] ENDOSCOPY;  Service: Endoscopy;  Laterality: N/A;   COLONOSCOPY WITH PROPOFOL N/A 02/06/2020   Procedure: COLONOSCOPY WITH PROPOFOL;  Surgeon: Charlott Rakes, MD;  Location: [facility] ENDOSCOPY;  Service: Endoscopy;  Laterality: N/A;   CORONARY ULTRASOUND/IVUS N/A 08/24/2018   Procedure: Intravascular Ultrasound/IVUS;  Surgeon: Lyn Records, MD;  Location: [facility] INVASIVE CV LAB;  Service: Cardiovascular;  Laterality: N/A;   ESOPHAGEAL MANOMETRY N/A 11/15/2017   Procedure: ESOPHAGEAL MANOMETRY (EM);  Surgeon: Charlott Rakes, MD;  Location: [facility];  Service: Endoscopy;  Laterality: N/A;   ESOPHAGEAL MANOMETRY  01/29/2021   Procedure: ESOPHAGEAL MANOMETRY (EM);  Surgeon: Iva Boop, MD;  Location: [facility]  ENDOS[facility];  Service: Endoscopy;;   ESOPHAGOGASTRODUODENOSCOPY  07/12/2012   Procedure: ESOPHAGOGASTRODUODENOSCOPY (EGD);  Surgeon: Shirley Friar, MD;  Location: [facility] ENDOSCOPY;  Service: Endoscopy;  Laterality: N/A;   ESOPHAGOGASTRODUODENOSCOPY (EGD) WITH PROPOFOL N/A 06/21/2018   Procedure: ESOPHAGOGASTRODUODENOSCOPY (EGD) WITH PROPOFOL;  Surgeon: Charlott Rakes, MD;  Location: [facility];  Service: Endoscopy;  Laterality: N/A;   ESOPHAGOGASTRODUODENOSCOPY (EGD) WITH PROPOFOL N/A 02/06/2020   Procedure: ESOPHAGOGASTRODUODENOSCOPY (EGD) WITH PROPOFOL;  Surgeon: Charlott Rakes, MD;  Location: [facility]OS[facility];  Service: Endoscopy;  Laterality: N/A;   FEMORAL-TIBIAL BYPASS GRAFT Left 06/23/2022   Procedure: BYPASS GRAFT FEMORAL-TIBIAL ARTERY;  Surgeon: Chuck Hint, MD;  Location: [facility] OR;  Service: Vascular;  Laterality: Left;   FOREIGN BODY RETRIEVAL N/A 02/06/2020   Procedure: FOREIGN BODY  RETRIEVAL;  Surgeon: Charlott Rakes, MD;  Location: [facility] ENDOS[facility];  Service: Endoscopy;  Laterality: N/A;   KNEE ARTHROSCOPY  09/21/1986   Left   LEFT HEART CATH AND CORONARY ANGIOGRAPHY N/A 08/24/2018   Procedure: LEFT HEART CATH AND CORONARY ANGIOGRAPHY;  Surgeon: Lyn Records, MD;  Location: [facility] INVASIVE CV LAB;  Service: Cardiovascular;  Laterality: N/A;   POLYPECTOMY  06/21/2018   Procedure: POLYPECTOMY;  Surgeon: Charlott Rakes, MD;  Location: [facility] ENDOS[facility];  Service: Endoscopy;;   POLYPECTOMY  02/06/2020   Procedure: POLYPECTOMY;  Surgeon: Charlott Rakes, MD;  Location: [facility];  Service: Endoscopy;;   SPINAL CORD STIMULATOR IMPLANT     SPINAL CORD STIMULATOR REMOVAL     SPINAL FUSION     x 3   THORACIC DISCECTOMY N/A 03/15/2019   Procedure: Laminectomy and Foraminotomy - Thoracic eleven -twelve;  Surgeon: Donalee Citrin, MD;  Location: [facility] OR;  Service: Neurosurgery;  Laterality: N/A;   Family History:  Family History  Problem Relation Age of Onset   Breast cancer Mother    Lung cancer Father    Liver disease Brother    Colon cancer Neg Hx     Tobacco Screening:  Social History   Tobacco Use  Smoking Status Former   Current packs/day: 0.00   Average packs/day: 1 pack/day for 20.0 years (20.0 ttl pk-yrs)   Types: Cigarettes   Start date: 04/09/1992   Quit date: 04/09/2012   Years since quitting: 11.6  Smokeless Tobacco Never  Tobacco Comments   smoking THC for pain/appetite     BH Tobacco Counseling     Are you interested in Tobacco Cessation Medications?  N/A, patient does not use tobacco products Counseled patient on smoking cessation:  N/A, patient does not use tobacco products Reason Tobacco Screening Not Completed: No value filed.       Social History:  Social History   Substance and Sexual Activity  Alcohol Use Yes   Comment: Maybe 1 glass of wine once a month, socially     Social History   Substance and Sexual Activity  Drug Use Yes    Types: Marijuana   Comment: lunch and bedtime, occasionally, just to have an appetite. Not every day per pt    Additional Social History:  Additional pertinent information: lives with wife, retired Metallurgist .  Patient reports that he got injured on psychiatry unit at this facility.  He gets workmans comp.                         Allergies:   Allergies  Allergen Reactions   Humibid La [Guaifenesin] Other (See Comments)    Hallucinations    Lab Results:  Results for orders placed or performed during the hospital encounter of 11/21/23 (from the past 48 hours)  Glucose, capillary     Status: Abnormal   Collection Time: 11/21/23  8:23 PM  Result Value Ref Range   Glucose-Capillary 134 (H) 70 - 99 mg/dL    Comment: Glucose reference range applies only to samples taken after fasting for at least 8 hours.    Blood Alcohol level:  Lab Results  Component Value Date   ETH 18 (H) 11/19/2023   ETH <10 06/18/2023    Metabolic Disorder Labs:  Lab Results  Component Value Date   HGBA1C 7.3 (H) 06/18/2023   MPG 162.81 06/18/2023   MPG 214 08/20/2022   No results found for: "PROLACTIN" Lab Results  Component Value Date   CHOL 218 (H) 06/24/2022   TRIG 100 06/24/2022   HDL 52 06/24/2022   CHOLHDL 4.2 06/24/2022   VLDL 20 06/24/2022   LDLCALC 146 (H) 06/24/2022   LDLCALC (H) 12/18/2008    124        Total Cholesterol/HDL:CHD Risk Coronary Heart Disease Risk Table                     Men   Women  1/2 Average Risk   3.4   3.3  Average Risk       5.0   4.4  2 X Average Risk   9.6   7.1  3 X Average Risk  23.4   11.0        Use the calculated Patient Ratio above and the CHD Risk Table to determine the patient's CHD Risk.        ATP III CLASSIFICATION (LDL):  <100     mg/dL   Optimal  914-782  mg/dL   Near or Above                    Optimal  130-159  mg/dL   Borderline  956-213  mg/dL   High  >086     mg/dL   Very High    Current Medications: Current  Facility-Administered Medications  Medication Dose Route Frequency Provider Last Rate Last Admin   acetaminophen (TYLENOL) tablet 650 mg  650 mg Oral Q6H PRN Motley-Mangrum, Jadeka A, PMHNP   650 mg at 11/21/23 1254   alum & mag hydroxide-simeth (MAALOX/MYLANTA) 200-200-20 MG/5ML suspension 30 mL  30 mL Oral Q4H PRN Motley-Mangrum, Jadeka A, PMHNP       aspirin EC tablet 81 mg  81 mg Oral Daily Motley-Mangrum, Jadeka A, PMHNP   81 mg at 11/22/23 0805   haloperidol (HALDOL) tablet 5 mg  5 mg Oral TID PRN Motley-Mangrum, Jadeka A, PMHNP       And   diphenhydrAMINE (BENADRYL) capsule 50 mg  50 mg Oral TID PRN Motley-Mangrum, Jadeka A, PMHNP       haloperidol lactate (HALDOL) injection 5 mg  5 mg Intramuscular TID PRN Motley-Mangrum, Jadeka A, PMHNP       And   diphenhydrAMINE (BENADRYL) injection 50 mg  50 mg Intramuscular TID PRN Motley-Mangrum, Jadeka A, PMHNP       And   LORazepam (ATIVAN) injection 2 mg  2 mg Intramuscular TID PRN Motley-Mangrum, Jadeka A, PMHNP       haloperidol lactate (HALDOL) injection 10 mg  10 mg Intramuscular TID PRN Motley-Mangrum, Jadeka A, PMHNP       And   diphenhydrAMINE (BENADRYL) injection 50 mg  50 mg Intramuscular TID  PRN Motley-Mangrum, Geralynn Ochs A, PMHNP       And   LORazepam (ATIVAN) injection 2 mg  2 mg Intramuscular TID PRN Motley-Mangrum, Jadeka A, PMHNP       glimepiride (AMARYL) tablet 4 mg  4 mg Oral Q breakfast Motley-Mangrum, Jadeka A, PMHNP   4 mg at 11/22/23 0805   insulin glargine (LANTUS) injection 12 Units  12 Units Subcutaneous QHS Motley-Mangrum, Jadeka A, PMHNP   12 Units at 11/21/23 2116   magnesium hydroxide (MILK OF MAGNESIA) suspension 30 mL  30 mL Oral Daily PRN Motley-Mangrum, Jadeka A, PMHNP       metFORMIN (GLUCOPHAGE-XR) 24 hr tablet 500 mg  500 mg Oral BID WC Motley-Mangrum, Jadeka A, PMHNP   500 mg at 11/22/23 0805   nitroGLYCERIN (NITROSTAT) SL tablet 0.4 mg  0.4 mg Sublingual Q5 min PRN Motley-Mangrum, Jadeka A, PMHNP        polyethylene glycol (MIRALAX / GLYCOLAX) packet 17 g  17 g Oral Daily PRN Lewanda Rife, MD   17 g at 11/21/23 1254   rosuvastatin (CRESTOR) tablet 5 mg  5 mg Oral Daily Motley-Mangrum, Jadeka A, PMHNP   5 mg at 11/22/23 0804   sertraline (ZOLOFT) tablet 100 mg  100 mg Oral Daily Motley-Mangrum, Jadeka A, PMHNP   100 mg at 11/22/23 0759   traZODone (DESYREL) tablet 50 mg  50 mg Oral QHS PRN Motley-Mangrum, Jadeka A, PMHNP   50 mg at 11/21/23 2114   PTA Medications: Medications Prior to Admission  Medication Sig Dispense Refill Last Dose/Taking   aspirin EC 81 MG tablet Take 1 tablet (81 mg total) by mouth daily. 90 tablet 3    Cholecalciferol (VITAMIN D3) 1000 units CAPS Take 1,000 Units by mouth daily after breakfast.      docusate sodium (COLACE) 100 MG capsule Take 1 capsule (100 mg total) by mouth every 12 (twelve) hours. (Patient not taking: Reported on 11/20/2023) 180 capsule 0    glimepiride (AMARYL) 4 MG tablet Take 2 tablets (8 mg total) by mouth daily. 180 tablet 1    glucose blood (FREESTYLE LITE) test strip Use to check blood glucose 2 times daily 200 strip 4    glucose blood test strip use 1 strip to check blood glucose 2 (two) times daily. 200 each 3    glucose blood test strip Use to check blood sugar 2 times a day 200 each 3    ibuprofen (ADVIL,MOTRIN) 200 MG tablet Take 600-800 mg by mouth every 8 (eight) hours as needed for moderate pain.       insulin aspart (NOVOLOG FLEXPEN) 100 UNIT/ML FlexPen Inject 2 - 5 Units into the skin 3 times daily before meals (Patient not taking: Reported on 11/20/2023) 3 mL 5    insulin degludec (TRESIBA FLEXTOUCH) 100 UNIT/ML FlexTouch Pen Inject 10 Units into the skin every evening. (Patient taking differently: Inject 12 Units into the skin at bedtime.) 9 mL 3    insulin degludec (TRESIBA FLEXTOUCH) 100 UNIT/ML FlexTouch Pen Inject 12 Units into the skin every evening. (Patient not taking: Reported on 11/20/2023) 12 mL 1    Insulin Pen Needle 32G X  4 MM MISC Use once a day as directed. 100 each 6    lidocaine (LIDODERM) 5 % Apply 2 patches to affected area daily for up to 12 hours.  Remove for 12 hours & repeat as directed. (Patient taking differently: Place 1 patch onto the skin daily as needed (pain).) 60 patch 11  metFORMIN (GLUCOPHAGE-XR) 500 MG 24 hr tablet Take 1 tablet (500 mg total) by mouth 2 (two) times daily. 60 tablet 6    metoCLOPramide (REGLAN) 5 MG tablet Take 1 tablet (5 mg total) by mouth 2 (two) times daily before a meal as needed. (Patient taking differently: Take 5 mg by mouth 2 (two) times daily as needed (to relieve symptoms such as fullness, nausea, and heartburn).) 60 tablet 0    metoprolol succinate (TOPROL-XL) 50 MG 24 hr tablet Take 1 tablet (50 mg total) by mouth daily. (discontinue 25 mg RX) 90 tablet 3    nitroGLYCERIN (NITROSTAT) 0.4 MG SL tablet Place 1 tablet (0.4 mg total) under the tongue every 5 (five) minutes as needed for chest pain. If no relief after 3 doses call 911 25 tablet 3    pantoprazole (PROTONIX) 40 MG tablet Take 1 tablet (40 mg total) by mouth 2 (two) times daily. 180 tablet 0    polyethylene glycol powder (MIRALAX) 17 GM/SCOOP powder Take 255 g by mouth daily as needed for moderate constipation or severe constipation. (Patient taking differently: Take 17 g by mouth daily as needed for mild constipation.) 510 g 0    rosuvastatin (CRESTOR) 5 MG tablet Take 1 tablet (5 mg total) by mouth daily. 30 tablet 11    sertraline (ZOLOFT) 100 MG tablet Take 1 tablet (100 mg total) by mouth daily. (Patient not taking: Reported on 11/20/2023) 90 tablet 3    sertraline (ZOLOFT) 100 MG tablet Take 2 tablets (200 mg total) by mouth daily. (Patient taking differently: Take 200 mg by mouth at bedtime.) 180 tablet 0    sildenafil (REVATIO) 20 MG tablet Take 1 - 5 tablets by mouth as needed as instructed (Patient not taking: Reported on 11/20/2023) 90 tablet 1    sildenafil (REVATIO) 20 MG tablet Take 1-5 tablets  (20-100 mg total) by mouth as needed as instructed. (Patient taking differently: Take 20-100 mg by mouth as needed (as directed- for ED).) 90 tablet 3    sitaGLIPtin (JANUVIA) 100 MG tablet Take 1 tablet (100 mg total) by mouth daily. 90 tablet 4    TYLENOL 500 MG tablet Take 500-1,000 mg by mouth every 6 (six) hours as needed for mild pain (pain score 1-3) or headache.       Musculoskeletal: Strength & Muscle Tone: within normal limits Gait & Station:  Uses walker Patient leans: N/A   Psychiatric Specialty Exam:   Presentation  General Appearance:  Appropriate for Environment   Eye Contact: Fair   Speech: Clear and Coherent   Speech Volume: Decreased       Mood and Affect  Mood: Depressed   Affect: Constricted and tearful     Thought Process  Thought Processes: Coherent   Descriptions of Associations:Intact   Orientation:Full (Time, Place and Person)   Thought Content:Abstract Reasoning   History of Schizophrenia/Schizoaffective disorder:No   Duration of Psychotic Symptoms:NA Hallucinations:Hallucinations: None   Ideas of Reference:None   Suicidal Thoughts:Suicidal Thoughts: Yes, Active SI Active Intent and/or Plan: Without Intent   Homicidal Thoughts:Homicidal Thoughts: No     Sensorium  Memory: Recent Fair; Remote Fair; Immediate Fair   Judgment: Impaired   Insight: Shallow     Executive Functions  Concentration: Fair   Attention Span: Fair   Recall: [employer] of Knowledge: Fair   Language: Fair     Psychomotor Activity  Psychomotor Activity: Psychomotor Activity: Decreased     Assets  Assets: Manufacturing systems engineer; Housing; Resilience  Sleep  Sleep: Sleep: Poor       Physical Exam: Physical Exam Constitutional:      Appearance: Normal appearance.  HENT:     Head: Atraumatic.     Nose: No congestion.  Eyes:     Conjunctiva/sclera: Conjunctivae normal.  Cardiovascular:     Rate and Rhythm: Regular  rhythm.  Pulmonary:     Effort: Pulmonary effort is normal. No respiratory distress.  Skin:    General: Skin is warm.  Neurological:     Mental Status: He is alert and oriented to person, place, and time.      Review of Systems  Constitutional:  Negative for chills and fever.  HENT:  Negative for congestion and sore throat.   Eyes:  Negative for blurred vision and double vision.  Respiratory:  Negative for cough and shortness of breath.   Cardiovascular:  Negative for chest pain and palpitations.  Gastrointestinal:  Negative for nausea and vomiting.  Neurological:  Negative for dizziness and speech change.  Psychiatric/Behavioral:  Positive for depression and suicidal ideas. The patient has insomnia.     Blood pressure (!) 128/95, pulse 67, temperature 97.8 F (36.6 C), resp. rate 18, height 6\' 3"  (1.905 m), weight 75.1 kg, SpO2 99%. Body mass index is 20.69 kg/m.  Treatment Plan Summary: Daily contact with patient to assess and evaluate symptoms and progress in treatment and Medication management  Observation Level/Precautions:  15 minute checks  Laboratory:  CBC Chemistry Profile HbAIC UDS  Psychotherapy:    Medications:    Consultations:    Discharge Concerns:    Estimated LOS:  Other:     Physician Treatment Plan for Primary Diagnosis: MDD (major depressive disorder) Long Term Goal(s): Improvement in symptoms so as ready for discharge  Short Term Goals: Ability to identify changes in lifestyle to reduce recurrence of condition will improve, Ability to verbalize feelings will improve, Ability to disclose and discuss suicidal ideas, Ability to demonstrate self-control will improve, Ability to identify and develop effective coping behaviors will improve, Ability to maintain clinical measurements within normal limits will improve, and Compliance with prescribed medications will improve  Patient is admitted to locked unit under safety precautions Patient found medicine  including Zoloft, aspirin, Amaryl, insulin Lantus, metformin, Crestor has been started We will continue on Zoloft 100 mg by mouth daily for depression We will consider adding Abilify 2 mg by mouth daily to augment the effect of antidepressant.  Side effect including metabolic syndrome discussed with the patient Will get hemoglobin A1c and lipid profile tomorrow morning Patient was encouraged to attend group and work on coping strategies Will encourage patient to follow-up with pain clinic upon discharge Social worker has been consulted to get collateral and help with a safe discharge plan   I certify that inpatient services furnished can reasonably be expected to improve the patient's condition.    Lewanda Rife, MD 3/3/202510:31 AM

## 2023-11-22 NOTE — Group Note (Signed)
 Date:  11/22/2023 Time:  12:24 AM  Group Topic/Focus:  Wrap-Up Group:   The focus of this group is to help patients review their daily goal of treatment and discuss progress on daily workbooks.    Participation Level:  Active  Participation Quality:  Attentive  Affect:  Appropriate  Cognitive:  Alert  Insight: Good  Engagement in Group:  Engaged  Modes of Intervention:  Discussion  Additional Comments:    Maeola Harman 11/22/2023, 12:24 AM

## 2023-11-22 NOTE — Progress Notes (Signed)
 Patient requested supplies to change his condom catheter.  Patient usually changes his own at home but was having a problem getting the catheter to unroll.  Patient assisted by this nurse with [facility] observing.  New condom cath applied and attach to new bag.  Patient tolerated well.

## 2023-11-22 NOTE — Group Note (Signed)
 Recreation Therapy Group Note   Group Topic:Goal Setting  Group Date: 11/22/2023 Start Time: 1500 End Time: 1600 Facilitators: Rosina Lowenstein, LRT, CTRS Location:  Dayroom  Group Description: Product/process development scientist. Patients were given many different magazines, a glue stick, markers, and a piece of cardstock paper. LRT and pts discussed the importance of having goals in life. LRT and pts discussed the difference between short-term and long-term goals, as well as what a SMART goal is. LRT encouraged pts to create a vision board, with images they picked and then cut out with safety scissors from the magazine, for themselves, that capture their short and long-term goals. LRT encouraged pts to show and explain their vision board to the group.   Goal Area(s) Addressed:  Patient will gain knowledge of short vs. long term goals.  Patient will identify goals for themselves. Patient will practice setting SMART goals. Patient will verbalize their goals to LRT and peers.   Affect/Mood: N/A   Participation Level: Did not attend    Clinical Observations/Individualized Feedback: Patient did not attend group.   Plan: Continue to engage patient in RT group sessions 2-3x/week.   Rosina Lowenstein, LRT, CTRS 11/22/2023 4:38 PM

## 2023-11-23 DIAGNOSIS — G8921 Chronic pain due to trauma: Secondary | ICD-10-CM | POA: Diagnosis not present

## 2023-11-23 DIAGNOSIS — F332 Major depressive disorder, recurrent severe without psychotic features: Secondary | ICD-10-CM | POA: Diagnosis not present

## 2023-11-23 LAB — LIPID PANEL
Cholesterol: 143 mg/dL (ref 0–200)
HDL: 47 mg/dL (ref >40)
LDL Cholesterol: 72 mg/dL (ref 0–99)
Total CHOL/HDL Ratio: 3 ratio
Triglycerides: 122 mg/dL (ref <150)
VLDL: 24 mg/dL (ref 0–40)

## 2023-11-23 LAB — HEMOGLOBIN A1C
Hgb A1c MFr Bld: 6.6 % — ABNORMAL HIGH (ref 4.8–5.6)
Mean Plasma Glucose: 142.72 mg/dL

## 2023-11-23 LAB — GLUCOSE, CAPILLARY: Glucose-Capillary: 148 mg/dL — ABNORMAL HIGH (ref 70–99)

## 2023-11-23 MED ORDER — ONDANSETRON 4 MG PO TBDP
4.0000 mg | ORAL_TABLET | Freq: Three times a day (TID) | ORAL | Status: DC | PRN
  Administered 2023-11-23 – 2023-11-25 (×4): 4 mg via ORAL
  Filled 2023-11-23 (×4): qty 1

## 2023-11-23 MED ORDER — CLONIDINE HCL 0.1 MG PO TABS
0.2000 mg | ORAL_TABLET | Freq: Once | ORAL | Status: AC
  Administered 2023-11-23: 0.2 mg via ORAL
  Filled 2023-11-23: qty 2

## 2023-11-23 NOTE — Progress Notes (Signed)
   11/23/23 0300  Psych Admission Type (Psych Patients Only)  Admission Status Involuntary  Psychosocial Assessment  Patient Complaints None  Eye Contact Fair  Facial Expression Flat  Affect Depressed  Speech Soft  Interaction Assertive;Minimal  Motor Activity Unsteady  Appearance/Hygiene Unremarkable  Behavior Characteristics Cooperative;Appropriate to situation  Mood Pleasant  Aggressive Behavior  Effect No apparent injury  Thought Process  Coherency WDL  Content Preoccupation  Delusions None reported or observed  Perception WDL  Hallucination None reported or observed  Judgment Limited  Confusion None  Danger to Self  Current suicidal ideation? Denies  Agreement Not to Harm Self Yes  Description of Agreement verbal  Danger to Others  Danger to Others None reported or observed

## 2023-11-23 NOTE — Group Note (Signed)
 Recreation Therapy Group Note   Group Topic:Coping Skills  Group Date: 11/23/2023 Start Time: 1500 End Time: 1545 Facilitators: Rosina Lowenstein, LRT, CTRS Location: Courtyard  Group Description: Music Reminisce. LRT encouraged patients to think of their favorite song(s) that reminded them of a positive memory or time in their life. LRT encouraged patient to talk about that memory aloud to the group. LRT played the song through a speaker for all to hear. LRT and patients discussed how thinking of a positive memory or time in their life can be used as a coping skill in everyday life post discharge.   Goal Area(s) Addressed: Patient will increase verbal communication by conversing with peers. Patient will contribute to group discussion with minimal prompting. Patient will reminisce a positive memory or moment in their life.    Affect/Mood: N/A   Participation Level: Did not attend    Clinical Observations/Individualized Feedback: Patient did not attend group.  Plan: Continue to engage patient in RT group sessions 2-3x/week.   Rosina Lowenstein, LRT, CTRS 11/23/2023 5:13 PM
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
## 2023-11-23 NOTE — Group Note (Signed)
 Recreation Therapy Group Note   Group Topic:Health and Wellness  Group Date: 11/23/2023 Start Time: 1100 End Time: 1130 Facilitators: Rosina Lowenstein, LRT, CTRS Location:  Dayroom  Group Description: Seated Exercise. LRT discussed the mental and physical benefits of exercise. LRT and group discussed how physical activity can be used as a coping skill. Pt's and LRT followed along to an exercise video on the TV screen that provided a visual representation and audio description of every exercise performed. Pt's encouraged to listen to their bodies and stop at any time if they experience feelings of discomfort or pain. Pts were encouraged to drink water and stay hydrated.   Goal Area(s) Addressed: Patient will learn benefits of physical activity. Patient will identify exercise as a coping skill.  Patient will follow multistep directions. Patient will try a new leisure interest.    Affect/Mood: N/A   Participation Level: Did not attend    Clinical Observations/Individualized Feedback: Patient did not attend group.   Plan: Continue to engage patient in RT group sessions 2-3x/week.   Rosina Lowenstein, LRT, CTRS 11/23/2023 12:28 PM

## 2023-11-23 NOTE — Group Note (Signed)
 Date:  11/23/2023 Time:  10:21 AM  Group Topic/Focus:  Goals Group:   The focus of this group is to help patients establish daily goals to achieve during treatment and discuss how the patient can incorporate goal setting into their daily lives to aide in recovery.    Participation Level:  Did Not Attend  Ardelle Anton 11/23/2023, 10:21 AM

## 2023-11-23 NOTE — Progress Notes (Signed)
   11/23/23 1500  Spiritual Encounters  Type of Visit Initial  Care provided to: Patient  Conversation partners present during encounter Nurse  Reason for visit Routine spiritual support  OnCall Visit No   Chaplain visited patient and offered reflective listening, empathy and a compassionate presence.  Chaplain also provided the patient with a Bible.    Rev. Rana M. Earlene Plater, MDiv Chaplain Resident [facility]

## 2023-11-23 NOTE — Progress Notes (Signed)
   11/23/23 1216  Psych Admission Type (Psych Patients Only)  Admission Status Involuntary  Psychosocial Assessment  Patient Complaints None  Eye Contact Fair  Facial Expression Flat  Affect Depressed  Speech Slow  Interaction Minimal  Motor Activity Unsteady  Appearance/Hygiene Unremarkable  Behavior Characteristics Cooperative  Mood Pleasant  Thought Process  Coherency WDL  Content Preoccupation  Delusions None reported or observed  Perception WDL  Hallucination None reported or observed  Judgment Limited  Confusion None  Danger to Self  Current suicidal ideation? Denies  Agreement Not to Harm Self Yes  Description of Agreement verbal  Danger to Others  Danger to Others None reported or observed

## 2023-11-23 NOTE — Group Note (Signed)
 Date:  11/23/2023 Time:  11:10 PM  Group Topic/Focus:  Wrap-Up Group:   The focus of this group is to help patients review their daily goal of treatment and discuss progress on daily workbooks.    Participation Level:  Active  Participation Quality:  Appropriate  Affect:  Appropriate  Cognitive:  Appropriate  Insight: Good  Engagement in Group:  Engaged  Modes of Intervention:  Discussion  Additional Comments:    Maeola Harman 11/23/2023, 11:10 PM

## 2023-11-23 NOTE — Plan of Care (Signed)
  Problem: Education: Goal: Ability to state activities that reduce stress will improve Outcome: Progressing   Problem: Coping: Goal: Ability to identify and develop effective coping behavior will improve Outcome: Progressing   Problem: Self-Concept: Goal: Ability to identify factors that promote anxiety will improve Outcome: Progressing

## 2023-11-23 NOTE — Group Note (Signed)
 Date:  11/23/2023 Time:  11:27 PM  Group Topic/Focus:  Wrap-Up Group:   The focus of this group is to help patients review their daily goal of treatment and discuss progress on daily workbooks.    Participation Level:  Did Not Attend  Participation Quality:      Affect:      Cognitive:      Insight: None  Engagement in Group:  None  Modes of Intervention:      Additional Comments:    Maeola Harman 11/23/2023, 11:27 PM
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
## 2023-11-24 DIAGNOSIS — F332 Major depressive disorder, recurrent severe without psychotic features: Secondary | ICD-10-CM | POA: Diagnosis not present

## 2023-11-24 DIAGNOSIS — G8921 Chronic pain due to trauma: Secondary | ICD-10-CM | POA: Diagnosis not present

## 2023-11-24 LAB — GLUCOSE, CAPILLARY: Glucose-Capillary: 119 mg/dL — ABNORMAL HIGH (ref 70–99)

## 2023-11-24 MED ORDER — SERTRALINE HCL 100 MG PO TABS
200.0000 mg | ORAL_TABLET | Freq: Every day | ORAL | Status: DC
  Administered 2023-11-25: 200 mg via ORAL
  Filled 2023-11-24: qty 4
  Filled 2023-11-24: qty 2

## 2023-11-24 MED ORDER — SERTRALINE HCL 50 MG PO TABS
100.0000 mg | ORAL_TABLET | ORAL | Status: AC
  Administered 2023-11-24: 100 mg via ORAL
  Filled 2023-11-24: qty 2

## 2023-11-24 NOTE — Progress Notes (Addendum)
 Physical Therapy Group Note  Group Topic:  Exercise/Activity Goals and Guidelines Group Date: 11/24/2023 Group Time (start and end): 13:00-13:36 Facilitators:  D. Elly Modena PT, DPT  Group Description: Group reviewed definition of physical activity and benefits of a consistent exercise program on both physical and mental health.  Defined various types of physical activity (aerobic exercise, resistance exercise, flexibility training, balance exercise) and discussed specific guidelines/recommendations for each type of activity.  Educated participants in various ways to monitor/progress activity tolerance including use of RPE scale. Encouraged each participant to identify ways to incorporate regular physical activity into his/her daily routine, and provided handout with various community resources to support ongoing physical activity upon transition back to home and community.   Therapeutic Goal(s): Identify immediate- and long-term effects of physical activity on individual health. Identify various types of physical activity and activities included with each type. Identify and demonstrate ability to monitor individual response to exercise. Identify a goal/interest for incorporating physical activity into daily routine, both in the unit and upon return home.   Individual Participation: Pt followed along quietly during discussion, opted out of functional ambulation component of the session.     Participation Level and Quality:  Quietly engaged   Behavior:  Appropriate  Speech/Thought Process:  Appropriate  Affect/Mood: Reserved   Insight:  Appropriate   Judgement:  Appropriate   Modes of Intervention: Verbal discussion, demonstration, handout provided     Plan: Continue to engage patient in PT/OT groups 1-2x/week.   Ovidio Hanger PT, DPT 11/24/23, 1:59 PM

## 2023-11-24 NOTE — Plan of Care (Signed)
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
## 2023-11-24 NOTE — [facility] Counselor (Signed)
 Adult Comprehensive Assessment  Patient ID: Justin Key, male   DOB: 05/19/1951, 73 y.o.   MRN: [id number]  Information Source: Information source: Patient  Current Stressors:  Patient states their primary concerns and needs for treatment are:: Pt reports he has been depressed for a while, since 2005 Patient states their goals for this hospitilization and ongoing recovery are:: Pt reports he wants to be able to feel "differently about my situation" Educational / Learning stressors: None reported Employment / Job issues: None reported Family Relationships: Pt reports his wife and himself have a strained relationship Financial / Lack of resources (include bankruptcy): None reported Housing / Lack of housing: None reported Physical health (include injuries & life threatening diseases): Pt reports that he had a back injury in 2005 that caused him chronic pain Social relationships: Pt reports that he feels he has isolated himself and that he is withdrawn Substance abuse: Pt reports he uses "some pot", reports it helps him with pain and his lack of appetite Bereavement / Loss: None reported  Living/Environment/Situation:  Living Arrangements: Spouse/significant other Living conditions (as described by patient or guardian): Pt reports he is comfortable in his home Who else lives in the home?: Pt reports his wife of 42 years How long has patient lived in current situation?: Pt does not report What is atmosphere in current home: Comfortable  Family History:  Marital status: Married Number of Years Married: 42 What types of issues is patient dealing with in the relationship?: Pt reports his wife was seeing another man before they got married, while they were dating, and that she did not tell the truth about it until 2 years ago Additional relationship information: Pt does not report Are you sexually active?: No What is your sexual orientation?: "women" Has your sexual activity been  affected by drugs, alcohol, medication, or emotional stress?: Pt reports he has been withholding sex from his wife due to finding out about the infidelity and feeling unloved by his wife Does patient have children?: Yes How many children?: 3 How is patient's relationship with their children?: "good"  Childhood History:  By whom was/is the patient raised?: Mother, Father Additional childhood history information: Pt does not report Description of patient's relationship with caregiver when they were a child: Unable to assess Patient's description of current relationship with people who raised him/her: Unable to assess How were you disciplined when you got in trouble as a child/adolescent?: Unable to assess Does patient have siblings?: No (Unable to assess) Did patient suffer any verbal/emotional/physical/sexual abuse as a child?: No Did patient suffer from severe childhood neglect?: No Has patient ever been sexually abused/assaulted/raped as an adolescent or adult?: No Was the patient ever a victim of a crime or a disaster?: Yes Patient description of being a victim of a crime or disaster: 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 domestic violence?: No Has patient been affected by domestic violence as an adult?: No  Education:  Highest grade of school patient has completed: Oncologist in Nursing Currently a student?: No Learning disability?: No  Employment/Work Situation:   Employment Situation: Retired Passenger transport manager has Been Impacted by Current Illness: Yes Describe how Patient's Job has Been Impacted: Pt reports in 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?: Yes (Describe in comment) Did  You Receive Any Psychiatric Treatment/Services While in the Military?: No  Financial Resources:    Financial resources: [employer], Medicare Does patient have a representative payee or guardian?: No  Alcohol/Substance Abuse:   What has been your use of drugs/alcohol within the last 12 months?: Pt reports he smokes marijuana in the mornings because it helps him with pain and lack of appetite If attempted suicide, did drugs/alcohol play a role in this?: No Alcohol/Substance Abuse Treatment Hx: Denies past history Has alcohol/substance abuse ever caused legal problems?: No  Social Support System:   Forensic psychologist System: None Describe Community Support System: Pt does not report Type of faith/religion: 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 How does patient's faith help to cope with current illness?: Pt does not report  Leisure/Recreation:   Do You Have Hobbies?: No  Strengths/Needs:   What is the patient's perception of their strengths?: None reported Patient states they can use these personal strengths during their treatment to contribute to their recovery: None reported Patient states these barriers may affect/interfere with their treatment: None reported Patient states these barriers may affect their return to the community: None reported Other important information patient would like considered in planning for their treatment: None reported  Discharge Plan:   Currently receiving community mental health services: No Patient states concerns and preferences for aftercare planning are: Pt reports he would like to think about it Patient states they will know when they are safe and ready for discharge when: "I'm safe now" Does patient have access to transportation?: Yes Does patient have financial barriers related to discharge medications?: No Patient description of barriers related to discharge medications: None Will patient be returning to same living situation after discharge?: Yes  Summary/Recommendations:   Summary and Recommendations (to be completed by the evaluator):  Patient is a 73 year old male from [place], [place] [facility] [place]). According to H&P, pt presented to the emergency department, via [facility], under IVC petition. IVC petitioned filed by wife. Per IVC, respondent stated that he wants to die, that he's ready to die. Respondent had a gun laying on his chest with it pointed under his chin. Respondent has repeatedly threatened to kill himself and his wife. Without intervention the respondent could harm himself and others. Upon assessment, pt reports that he was holding a gun underneath his chin and told his wife that "this is something that CAN happen". Pt reports that he feels his life has been going downhill since 2005 when he went on disability following a back injury. 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 He reports since then he has been depressed throughout assessment pt was fixated on issues with the relationship he has with his wife. He reports his wife lied about seeing another man while they were dating and reports she only told the truth about it 2 years ago and they have been together 42 years. Pt reports he is unsure of doing therapy or psychiatry. Pt's primary diagnosis is Major Depressive Disorder.  Recommendations include: crisis stabilization, therapeutic milieu, encourage group attendance and participation, medication management for mood stabilization and development of comprehensive mental wellness/sobriety plan.  Elza Rafter. 11/24/2023

## 2023-11-24 NOTE — Group Note (Signed)
 Date:  11/24/2023 Time:  8:42 PM  Group Topic/Focus:  Wrap-Up Group:   The focus of this group is to help patients review their daily goal of treatment and discuss progress on daily workbooks.    Participation Level:  Active  Participation Quality:  Appropriate  Affect:  Appropriate  Cognitive:  Appropriate  Insight: Appropriate  Engagement in Group:  Engaged  Modes of Intervention:  Discussion    Lenore Cordia 11/24/2023, 8:42 PM

## 2023-11-24 NOTE — [facility] Suicide Risk Assessment (Signed)
 [facility] INPATIENT:  Family/Significant Other Suicide Prevention Education  Suicide Prevention Education:  Patient Refusal for Family/Significant Other Suicide Prevention Education: The patient Justin Key has refused to provide written consent for family/significant other to be provided Family/Significant Other Suicide Prevention Education during admission and/or prior to discharge.  Physician notified.  Elza Rafter 11/24/2023, 10:04 AM

## 2023-11-24 NOTE — Progress Notes (Signed)
 Patient alert and oriented x 4. Complaining of nausea, back pain and high blood pressure. Medications given and was effective. Now patient in bed appears asleep. Will continue to monitor.

## 2023-11-25 ENCOUNTER — Other Ambulatory Visit: Payer: Self-pay

## 2023-11-25 ENCOUNTER — Other Ambulatory Visit (HOSPITAL_COMMUNITY): Payer: Self-pay

## 2023-11-25 DIAGNOSIS — F332 Major depressive disorder, recurrent severe without psychotic features: Secondary | ICD-10-CM | POA: Diagnosis not present

## 2023-11-25 DIAGNOSIS — G8921 Chronic pain due to trauma: Secondary | ICD-10-CM | POA: Diagnosis not present

## 2023-11-25 MED ORDER — METFORMIN HCL ER 500 MG PO TB24: 500.0000 mg | ORAL_TABLET | Freq: Two times a day (BID) | ORAL | 0 refills | Status: DC

## 2023-11-25 MED ORDER — NITROGLYCERIN 0.4 MG SL SUBL
0.4000 mg | SUBLINGUAL_TABLET | SUBLINGUAL | 0 refills | Status: AC | PRN
Start: 2023-11-25 — End: ?

## 2023-11-25 MED ORDER — IBUPROFEN 600 MG PO TABS
600.0000 mg | ORAL_TABLET | Freq: Four times a day (QID) | ORAL | 0 refills | Status: AC | PRN
Start: 2023-11-25 — End: ?

## 2023-11-25 MED ORDER — ARIPIPRAZOLE 2 MG PO TABS
2.0000 mg | ORAL_TABLET | Freq: Every day | ORAL | 0 refills | Status: AC
  Filled 2023-11-25: qty 30, 30d supply, fill #0

## 2023-11-25 MED ORDER — NITROGLYCERIN 0.4 MG SL SUBL: 0.4000 mg | SUBLINGUAL_TABLET | SUBLINGUAL | 0 refills | Status: AC

## 2023-11-25 MED ORDER — TRAZODONE HCL 50 MG PO TABS
50.0000 mg | ORAL_TABLET | Freq: Every evening | ORAL | 0 refills | Status: DC | PRN
  Filled 2023-11-25: qty 30, 30d supply, fill #0

## 2023-11-25 MED ORDER — ASPIRIN 81 MG PO TBEC: 81.0000 mg | DELAYED_RELEASE_TABLET | Freq: Every day | ORAL | 12 refills | Status: AC

## 2023-11-25 MED ORDER — INSULIN GLARGINE 100 UNIT/ML ~~LOC~~ SOLN: 12.0000 [IU] | Freq: Every day | SUBCUTANEOUS | 11 refills | Status: AC

## 2023-11-25 MED ORDER — GLIMEPIRIDE 4 MG PO TABS
4.0000 mg | ORAL_TABLET | Freq: Every day | ORAL | 0 refills | Status: AC
Start: 2023-11-26 — End: ?

## 2023-11-25 MED ORDER — SERTRALINE HCL 100 MG PO TABS: 200.0000 mg | ORAL_TABLET | Freq: Every day | ORAL | 0 refills | Status: DC

## 2023-11-25 MED ORDER — SERTRALINE HCL 100 MG PO TABS
200.0000 mg | ORAL_TABLET | Freq: Every day | ORAL | 0 refills | Status: DC
  Filled 2024-04-17: qty 30, 15d supply, fill #0

## 2023-11-25 NOTE — Progress Notes (Signed)
   11/25/23 0630  15 Minute Checks  Location Bedroom  Visual Appearance Calm  Behavior Sleeping  Sleep (Behavioral Health Patients Only)  Calculate sleep? (Click Yes once per 24 hr at 0600 safety check) Yes  Documented sleep last 24 hours 13

## 2023-11-25 NOTE — Progress Notes (Signed)
   11/24/23 2107  Psych Admission Type (Psych Patients Only)  Admission Status Involuntary  Psychosocial Assessment  Patient Complaints None  Eye Contact Fair  Facial Expression Flat  Affect Depressed  Speech Slow  Interaction Minimal  Motor Activity Unsteady  Appearance/Hygiene Unremarkable  Behavior Characteristics Cooperative  Mood Pleasant  Aggressive Behavior  Effect No apparent injury  Thought Process  Coherency WDL  Content Preoccupation  Delusions None reported or observed  Perception WDL  Hallucination None reported or observed  Judgment Limited  Confusion None  Danger to Self  Current suicidal ideation? Denies  Agreement Not to Harm Self Yes  Description of Agreement Verbal  Danger to Others  Danger to Others None reported or observed

## 2023-11-25 NOTE — Progress Notes (Signed)
 Patient pleasant and cooperative.  Appropriate affect.  Denies SI/HI and AVH.  Denies anxiety and depression.  Denies pain.  Reports he slept well.    Compliant with scheduled, medications. 15 min checks in place fro safety.  Patient is present in the milieu.  Appropriate interaction with peers.   Patient changed condom catheter independently.

## 2023-11-25 NOTE — Progress Notes (Signed)
  [facility] Adult Case Management Discharge Plan :  Will you be returning to the same living situation after discharge:  Yes,  pt will return home  At discharge, do you have transportation home?: Yes,  pt's wife will pick him up  Do you have the ability to pay for your medications: Yes,  [employer] / [employer] PPO  Release of information consent forms completed and in the chart;  Patient's signature needed at discharge.  Patient to Follow up at:  Follow-up Information     [facility] Go on 12/02/2023.   Why: Your appointment is scheduled for 12/02/23 @ 12:30pm via virtual call Contact information: [address]  [address] [place] [address] [phone]                 Next level of care provider has access to [facility] Link:no  Safety Planning and Suicide Prevention discussed: Yes,  [place],Wanda, spouse      Has patient been referred to the Quitline?: Patient does not use tobacco/nicotine products  Patient has been referred for addiction treatment: No known substance use disorder.  [address] LCSWA 11/25/2023, 10:42 AM
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
## 2023-11-25 NOTE — Group Note (Signed)
 Recreation Therapy Group Note   Group Topic:Relaxation  Group Date: 11/25/2023 Start Time: 1100 End Time: 1135 Facilitators: Rosina Lowenstein, LRT, CTRS Location:  Dayroom  Group Description: PMR (Progressive Muscle Relaxation). LRT educates patients on what PMR is and the benefits that come from it. Patients are asked to sit with their feet flat on the floor while sitting up and all the way back in their chair, if possible. LRT and pts follow a prompt through a speaker that requires you to tense and release different muscles in their body and focus on their breathing. During session, lights are off and soft music is being played. LRT provides patients with an education handout on PMR.     Goal Area(s) Addressed:    Patients will be able to describe progressive muscle relaxation.    Patient will practice using relaxation technique.   Patient will identify a new coping skill.    Patient will follow multistep directions to reduce anxiety and stress.    Affect/Mood: N/A   Participation Level: Did not attend    Clinical Observations/Individualized Feedback: Patient did not attend group.   Plan: Continue to engage patient in RT group sessions 2-3x/week.   Rosina Lowenstein, LRT, CTRS 11/25/2023 12:34 PM

## 2023-11-25 NOTE — Plan of Care (Signed)
   Problem: Education: Goal: Knowledge of [facility] Education information/materials will improve Outcome: Progressing Goal: Emotional status will improve Outcome: Progressing Goal: Mental status will improve Outcome: Progressing Goal: Verbalization of understanding the information provided will improve Outcome: Progressing

## 2023-11-25 NOTE — Progress Notes (Signed)
 Discharge Note:  Patient denies SI/HI/AVH at this time. Discharge instructions, AVS, prescriptions, and transition record reviewed with patient. Patient agrees to comply with medication management, follow-up visit, and outpatient therapy.  Patient belongings returned to patient (cell phone).  Questions and concerns addressed and answered.  Patient ambulatory off unit. Patient discharged to home with wife.   Suicide Safety Plan completed.    1 remaining condom catheter sent home with patent.

## 2023-11-25 NOTE — [facility] Suicide Risk Assessment (Signed)
 [facility] Discharge Suicide Risk Assessment   Principal Problem: MDD (major depressive disorder) Discharge Diagnoses: Principal Problem:   MDD (major depressive disorder) Active Problems:   Chronic pain due to trauma    Musculoskeletal: Strength & Muscle Tone: within normal limits Gait & Station:  Uses walker Patient leans: N/A   Psychiatric Specialty Exam:   Presentation  General Appearance:  Appropriate for Environment   Eye Contact: Fair   Speech: Clear and Coherent   Speech Volume: Normal       Mood and Affect  Mood: "Better"   Affect: More animated     Thought Process  Thought Processes: Coherent   Descriptions of Associations:Intact   Orientation:Full (Time, Place and Person)   Thought Content:Abstract Reasoning   History of Schizophrenia/Schizoaffective disorder:No   Duration of Psychotic Symptoms:NA Hallucinations:Hallucinations: None   Ideas of Reference:None   Suicidal Thoughts:Denies SI   Homicidal Thoughts:Homicidal Thoughts: No     Sensorium  Memory: Recent Fair; Remote Fair; Immediate Fair   Judgment: Improved   Insight: Improving     Executive Functions  Concentration: Fair   Attention Span: Fair   Recall: [employer] of Knowledge: Fair   Language: Fair     Psychomotor Activity  Psychomotor Activity: Psychomotor Activity: Improved     Assets  Assets: Manufacturing systems engineer; Housing; Resilience     Sleep  Sleep: Sleep: Improved       Physical Exam: Physical Exam Constitutional:      Appearance: Normal appearance.  HENT:     Head: Atraumatic.     Nose: No congestion.  Eyes:     Conjunctiva/sclera: Conjunctivae normal.  Cardiovascular:     Rate and Rhythm: Regular rhythm.  Pulmonary:     Effort: Pulmonary effort is normal. No respiratory distress.  Skin:    General: Skin is warm.  Neurological:     Mental Status: He is alert and oriented to person, place, and time.      Review of Systems   Constitutional:  Negative for chills and fever.  HENT:  Negative for congestion and sore throat.   Eyes:  Negative for blurred vision and double vision.  Respiratory:  Negative for cough and shortness of breath.   Cardiovascular:  Negative for chest pain and palpitations.  Gastrointestinal:  Negative for nausea and vomiting.  Neurological:  Negative for dizziness and speech change.   Blood pressure 124/76, pulse 75, temperature (!) 97 F (36.1 C), resp. rate 16, height 6\' 3"  (1.905 m), weight 75.1 kg, SpO2 100%. Body mass index is 20.69 kg/m.   Demographic Factors:  Age 73 or older and Unemployed  Loss Factors: Decrease in vocational status and Decline in physical health  Historical Factors: Impulsivity  Risk Reduction Factors:   Sense of responsibility to family, Living with another person, especially a relative, Positive social support, Positive therapeutic relationship, and Positive coping skills or problem solving skills  Continued Clinical Symptoms:  Previous Psychiatric Diagnoses and Treatments  Cognitive Features That Contribute To Risk:  Closed-mindedness and Thought constriction (tunnel vision)    Suicide Risk:  Minimal: No identifiable suicidal ideation.      Plan Of Care/Follow-up recommendations:  Per Discharge Summary  Lewanda Rife, MD 11/25/2023, 9:49 AM

## 2023-11-25 NOTE — Plan of Care (Signed)
 Pt denies SI/HI/AVH, with generalized pain. Requesting tylenol to help with pain. Pt is pleasant and cooperative. Pt was offered support and encouragement. Pt was given scheduled medications. Pt was encourage to attend groups. Q 15 minute safety checks done/ongoing. Pt receptive to treatment and safety maintained on unit.   Problem: Education: Goal: Knowledge of [facility] Education information/materials will improve Outcome: Progressing Goal: Emotional status will improve Outcome: Progressing Goal: Mental status will improve Outcome: Progressing Goal: Verbalization of understanding the information provided will improve Outcome: Progressing   Problem: Education: Goal: Ability to state activities that reduce stress will improve Outcome: Progressing   Problem: Coping: Goal: Ability to identify and develop effective coping behavior will improve Outcome: Progressing   Problem: Self-Concept: Goal: Ability to identify factors that promote anxiety will improve Outcome: Progressing Goal: Level of anxiety will decrease Outcome: Progressing Goal: Ability to modify response to factors that promote anxiety will improve Outcome: Progressing   Problem: Education: Goal: Utilization of techniques to improve thought processes will improve Outcome: Progressing Goal: Knowledge of the prescribed therapeutic regimen will improve Outcome: Progressing

## 2023-11-26 ENCOUNTER — Other Ambulatory Visit (HOSPITAL_COMMUNITY): Payer: Self-pay

## 2023-11-30 ENCOUNTER — Other Ambulatory Visit (HOSPITAL_COMMUNITY): Payer: Self-pay

## 2023-12-01 ENCOUNTER — Other Ambulatory Visit (HOSPITAL_COMMUNITY): Payer: Self-pay

## 2023-12-01 MED ORDER — METFORMIN HCL ER 500 MG PO TB24
500.0000 mg | ORAL_TABLET | Freq: Two times a day (BID) | ORAL | 6 refills | Status: AC
  Filled 2023-12-01: qty 60, 30d supply, fill #0
  Filled 2024-02-19: qty 60, 30d supply, fill #1

## 2023-12-02 ENCOUNTER — Other Ambulatory Visit (HOSPITAL_COMMUNITY): Payer: Self-pay

## 2023-12-02 ENCOUNTER — Other Ambulatory Visit: Payer: Self-pay

## 2023-12-03 ENCOUNTER — Other Ambulatory Visit (HOSPITAL_COMMUNITY): Payer: Self-pay

## 2023-12-10 ENCOUNTER — Telehealth: Payer: Self-pay | Admitting: Internal Medicine

## 2023-12-10 NOTE — Telephone Encounter (Signed)
 Left message on machine to call back

## 2023-12-10 NOTE — Telephone Encounter (Signed)
 Inbound call from patient spouse states he has been experiencing nausea and vomiting for the past few months. States more so in the morning. Patient is currently scheduled for 5/19 with a PA. Requesting to speak with a nurse. Please advise.   Thank you

## 2023-12-15 NOTE — Telephone Encounter (Signed)
 Left message for patient to call back

## 2023-12-17 NOTE — Telephone Encounter (Signed)
Confirmed with spouse

## 2023-12-17 NOTE — Telephone Encounter (Signed)
 Called the patient. Offered 12/20/23 at 3:30 pm. He will ask his spouse "because she is my transportation and call you right back."

## 2023-12-20 ENCOUNTER — Ambulatory Visit: Admitting: Internal Medicine

## 2023-12-20 ENCOUNTER — Other Ambulatory Visit (HOSPITAL_COMMUNITY): Payer: Self-pay

## 2023-12-20 ENCOUNTER — Encounter: Payer: Self-pay | Admitting: Internal Medicine

## 2023-12-20 VITALS — BP 138/64 | HR 75 | Ht 75.0 in | Wt 174.0 lb

## 2023-12-20 DIAGNOSIS — K224 Dyskinesia of esophagus: Secondary | ICD-10-CM | POA: Diagnosis not present

## 2023-12-20 DIAGNOSIS — R110 Nausea: Secondary | ICD-10-CM

## 2023-12-20 DIAGNOSIS — Z860101 Personal history of adenomatous and serrated colon polyps: Secondary | ICD-10-CM | POA: Diagnosis not present

## 2023-12-20 DIAGNOSIS — K222 Esophageal obstruction: Secondary | ICD-10-CM | POA: Diagnosis not present

## 2023-12-20 DIAGNOSIS — R112 Nausea with vomiting, unspecified: Secondary | ICD-10-CM

## 2023-12-20 DIAGNOSIS — K2289 Other specified disease of esophagus: Secondary | ICD-10-CM

## 2023-12-20 MED ORDER — METOCLOPRAMIDE HCL 5 MG PO TABS
ORAL_TABLET | ORAL | 5 refills | Status: AC
  Filled 2023-12-20: qty 60, 12d supply, fill #0
  Filled 2024-05-18: qty 60, 12d supply, fill #1
  Filled 2024-06-09: qty 60, 12d supply, fill #2

## 2023-12-20 NOTE — Patient Instructions (Addendum)
 We have sent the following medications to your pharmacy for you to pick up at your convenience:reglan.   _______________________________________________________  If your blood pressure at your visit was 140/90 or greater, please contact your primary care physician to follow up on this.  _______________________________________________________  If you are age 73 or older, your body mass index should be between 23-30. Your Body mass index is 21.75 kg/m. If this is out of the aforementioned range listed, please consider follow up with your Primary Care Provider.  If you are age 85 or younger, your body mass index should be between 19-25. Your Body mass index is 21.75 kg/m. If this is out of the aformentioned range listed, please consider follow up with your Primary Care Provider.   ________________________________________________________  The [facility] providers would like to encourage you to use [facility] to communicate with providers for non-urgent requests or questions.  Due to long hold times on the telephone, sending your provider a message by [facility] may be a faster and more efficient way to get a response.  Please allow 48 business hours for a response.  Please remember that this is for non-urgent requests.  _______________________________________________________  I appreciate the opportunity to care for you. Stan Head, MD, [facility]
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
## 2023-12-24 NOTE — Progress Notes (Signed)
 Patient went to [facility] on 11/21/2023 and COVID test needed for inpatient admission.

## 2023-12-25 ENCOUNTER — Other Ambulatory Visit (HOSPITAL_COMMUNITY): Payer: Self-pay

## 2023-12-27 ENCOUNTER — Ambulatory Visit (INDEPENDENT_AMBULATORY_CARE_PROVIDER_SITE_OTHER): Admitting: Podiatry

## 2023-12-27 ENCOUNTER — Encounter: Payer: Self-pay | Admitting: Podiatry

## 2023-12-27 DIAGNOSIS — I739 Peripheral vascular disease, unspecified: Secondary | ICD-10-CM

## 2023-12-27 DIAGNOSIS — L84 Corns and callosities: Secondary | ICD-10-CM

## 2023-12-27 DIAGNOSIS — E1149 Type 2 diabetes mellitus with other diabetic neurological complication: Secondary | ICD-10-CM

## 2023-12-27 DIAGNOSIS — B351 Tinea unguium: Secondary | ICD-10-CM | POA: Diagnosis not present

## 2023-12-29 NOTE — Progress Notes (Signed)
 Subjective:  Chief Complaint  Patient presents with   Foot Ulcer    RM#11 Follow up on left foot ulcer big toe.    Justin Key is a 73 year old male presents the office today with concerns.  He has a hard spot in the left big toe with no open lesions identified.  He has also taken discussed he cannot himself.  No open lesions otherwise.  No other concerns.  Objective: General: No acute distress, AAOx3  Foot appears to be well-perfused. Sensation decreased There is a very hypertrophic and dystrophic nails 2 through 5 on the left and 1 through 4 on the right.  There is no edema, erythema or signs of infection. Along the lateral aspect left hallux is a hyperkeratotic lesion.  No underlying ulceration measures or signs of infection and this is minimal today.   No pain with calf compression, swelling, warmth, erythema.    Assessment and Plan:  Symptomatic onychomycosis, preulcerative lesion left hallux  -Treatment options discussed including all alternatives, risks, and complications -Debrided nails x 8 without any complications or bleeding. -Sharply debrided the hyperkeratotic lesion on the left foot and complications of bleeding.  Moisturizer.  Offloading. -Daily foot inspection  Return in about 3 months (around 03/27/2024) for foot exam.  Vivi Barrack DPM

## 2024-01-07 ENCOUNTER — Other Ambulatory Visit (HOSPITAL_COMMUNITY): Payer: Self-pay

## 2024-01-08 ENCOUNTER — Other Ambulatory Visit (HOSPITAL_COMMUNITY): Payer: Self-pay

## 2024-01-10 ENCOUNTER — Other Ambulatory Visit (HOSPITAL_COMMUNITY): Payer: Self-pay

## 2024-01-11 ENCOUNTER — Other Ambulatory Visit (HOSPITAL_COMMUNITY): Payer: Self-pay

## 2024-01-12 ENCOUNTER — Other Ambulatory Visit (HOSPITAL_COMMUNITY): Payer: Self-pay

## 2024-01-13 DIAGNOSIS — E0590 Thyrotoxicosis, unspecified without thyrotoxic crisis or storm: Secondary | ICD-10-CM | POA: Diagnosis not present

## 2024-01-13 DIAGNOSIS — R5383 Other fatigue: Secondary | ICD-10-CM | POA: Diagnosis not present

## 2024-01-13 DIAGNOSIS — E782 Mixed hyperlipidemia: Secondary | ICD-10-CM | POA: Diagnosis not present

## 2024-01-13 DIAGNOSIS — I2510 Atherosclerotic heart disease of native coronary artery without angina pectoris: Secondary | ICD-10-CM | POA: Diagnosis not present

## 2024-01-14 ENCOUNTER — Other Ambulatory Visit (HOSPITAL_COMMUNITY): Payer: Self-pay

## 2024-01-14 ENCOUNTER — Ambulatory Visit (HOSPITAL_BASED_OUTPATIENT_CLINIC_OR_DEPARTMENT_OTHER): Payer: PPO | Admitting: Cardiovascular Disease

## 2024-01-14 ENCOUNTER — Encounter (HOSPITAL_BASED_OUTPATIENT_CLINIC_OR_DEPARTMENT_OTHER): Payer: Self-pay | Admitting: Cardiovascular Disease

## 2024-01-14 ENCOUNTER — Encounter (HOSPITAL_BASED_OUTPATIENT_CLINIC_OR_DEPARTMENT_OTHER): Payer: Self-pay

## 2024-01-14 VITALS — BP 124/66 | HR 65 | Ht 75.0 in | Wt 174.9 lb

## 2024-01-14 DIAGNOSIS — E1169 Type 2 diabetes mellitus with other specified complication: Secondary | ICD-10-CM

## 2024-01-14 DIAGNOSIS — I739 Peripheral vascular disease, unspecified: Secondary | ICD-10-CM | POA: Diagnosis not present

## 2024-01-14 DIAGNOSIS — I152 Hypertension secondary to endocrine disorders: Secondary | ICD-10-CM | POA: Diagnosis not present

## 2024-01-14 DIAGNOSIS — I25118 Atherosclerotic heart disease of native coronary artery with other forms of angina pectoris: Secondary | ICD-10-CM | POA: Diagnosis not present

## 2024-01-14 DIAGNOSIS — E785 Hyperlipidemia, unspecified: Secondary | ICD-10-CM | POA: Diagnosis not present

## 2024-01-14 DIAGNOSIS — R5383 Other fatigue: Secondary | ICD-10-CM | POA: Diagnosis not present

## 2024-01-14 DIAGNOSIS — E1159 Type 2 diabetes mellitus with other circulatory complications: Secondary | ICD-10-CM | POA: Diagnosis not present

## 2024-01-14 LAB — LIPID PANEL
Chol/HDL Ratio: 3.5 ratio (ref 0.0–5.0)
Cholesterol, Total: 150 mg/dL (ref 100–199)
HDL: 43 mg/dL (ref >39)
LDL Chol Calc (NIH): 88 mg/dL (ref 0–99)
Triglycerides: 100 mg/dL (ref 0–149)
VLDL Cholesterol Cal: 19 mg/dL (ref 5–40)

## 2024-01-14 LAB — HEPATIC FUNCTION PANEL
ALT: 8 IU/L (ref 0–44)
AST: 12 IU/L (ref 0–40)
Albumin: 3.6 g/dL — ABNORMAL LOW (ref 3.8–4.8)
Alkaline Phosphatase: 69 IU/L (ref 44–121)
Bilirubin Total: 0.2 mg/dL (ref 0.0–1.2)
Bilirubin, Direct: 0.12 mg/dL (ref 0.00–0.40)
Total Protein: 5.9 g/dL — ABNORMAL LOW (ref 6.0–8.5)

## 2024-01-14 MED ORDER — ROSUVASTATIN CALCIUM 10 MG PO TABS
10.0000 mg | ORAL_TABLET | Freq: Every day | ORAL | 3 refills | Status: AC
  Filled 2024-01-14: qty 90, 90d supply, fill #0
  Filled 2024-05-15: qty 90, 90d supply, fill #1
  Filled 2024-08-29: qty 90, 90d supply, fill #2

## 2024-01-14 NOTE — Progress Notes (Signed)
 Cardiology Office Note:  .   Date:  01/18/2024  ID:  Justin Key, DOB 04/30/1951, MRN [id number] PCP: Justin Bradley, MD  [facility] Providers Cardiologist:  Justin Sos, MD    History of Present Illness: .    Justin Key is a 73 y.o. male with moderate to severe multivessel CAD, MI s/p angioplasty 1997, peripheral vascular disease, hypertension, hyperlipidemia, type 2 diabetes, gastroparesis, GERD, chronic pain syndrome related to lumbar disc disease, who presents for follow-up and to establish care with me. He is a former patient of Dr. Felipe Key, last seen by him 12/2021. LDL was extremely elevated, and he had prior heart cath in 2019 showing moderate CAD. They discussed bempedoic acid, PCSK9 therapy, Leqvio, as well as referral to the lipid clinic. However, he declined therapy given poor experiences with statin therapy remotely. He had absent pedal pulses on exam. Lower extremity duplex revealed total occlusion of the left superficial femoral artery that was reconstituted by collaterals. On 04/28/2022 he called EMS for sharp chest pain, nausea, and emesis. Was given 1 sublingual nitroglycerin  that did not help, given aspirin  and IV Zofran  with improvement. Troponins 29 and 46, EKG was nonischemic. Cardiology recommended conservative management.    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 He was admitted on 06/23/22 and underwent left common femoral artery to below-knee popliteal artery bypass with nonreversed translocated saphenous vein graft by Dr. Shaunna Delaware. He later underwent left great toe amputation to the interphalangeal joint 08/21/2022.  At his visit 04/2023 he noted occasional CP.  He was not interested in cath, stress or escalating medical therapy.  Metoprolol  was increased.  He saw Justin Banks, NP   Discussed the use of AI scribe software for clinical note transcription with the patient, who gave verbal consent to proceed.  History of Present Illness Mr.  Justin Key experiences intermittent chest pain described as 'a little ping' that occurs randomly, even at rest. The episodes are not constant and do not last long. No associated shortness of breath or exertional symptoms. An increased dose of metoprolol  has not made a noticeable difference in his symptoms.  He has experienced significant weight loss from 220 pounds to 174 pounds over an unspecified period, with a noted drop from 187 pounds last month. He attributes this to a loss of appetite, sometimes realizing he has not eaten all day. He occasionally uses marijuana to stimulate his appetite, which helps him eat within 30-40 minutes of use.  He denies melena or hematochezia.   He experiences frequent balance issues and falls, along with extreme fatigue and weakness. Difficulty sleeping due to discomfort from previous back surgeries affects his sleep quality. He has a history of six back surgeries, which contribute to his sleep disturbances.  He is currently on 5 mg of Crestor  for cholesterol management and metoprolol  for blood pressure control. He has a history of muscle aches with higher doses of statins in the past. He denies using nitroglycerin  due to the headaches it causes.  ROS:  As per HPI  Studies Reviewed: .       N/a  Risk Assessment/Calculations:         Physical Exam:   VS:  BP 124/66   Pulse 65   Ht 6\' 3"  (1.905 m)   Wt 174 lb 14.4 oz (79.3 kg)   SpO2 98%   BMI 21.86 kg/m  , BMI Body mass index is 21.86 kg/m. GENERAL:  Chronically ill-appearing.  In wheelchair.  HEENT: Pupils equal  round and reactive, fundi not visualized, oral mucosa unremarkable NECK:  No jugular venous distention, waveform within normal limits, carotid upstroke brisk and symmetric, no bruits, no thyromegaly LUNGS:  Clear to auscultation bilaterally HEART:  RRR.  PMI not displaced or sustained,S1 and S2 within normal limits, no S3, no S4, no clicks, no rubs, no murmurs ABD:  Flat, positive bowel sounds  normal in frequency in pitch, no bruits, no rebound, no guarding, no midline pulsatile mass, no hepatomegaly, no splenomegaly EXT:  No edema, no cyanosis no clubbing SKIN:  No rashes no nodules NEURO:  Cranial nerves II through XII grossly intact, motor grossly intact throughout PSYCH:  Cognitively intact, oriented to person place and time   ASSESSMENT AND PLAN: .    Assessment & Plan # CAD:  # Angina pectoris Intermittent chest pain not exertion-related. Previous metoprolol  increase ineffective. No invasive testing desired. Symptoms infrequent, not warranting immediate intervention.  Continue aspirin , metoprolol  and increase rosuvastatin .  # Hyperlipidemia LDL 88 mg/dL, above target <78 mg/dL. Current rosuvastatin  5 mg. Discussed Repatha, Nexlizet. Agreed to increase rosuvastatin  to 10 mg. - Increase rosuvastatin  to 10 mg daily. - Recheck fasting lipid panel in two months.  # PAD:  Continue vascular follow up.   # Fatigue, unspecified Extreme tiredness, weakness. Not anemic. Thyroid  dysfunction considered, not checked since 2022. - Request addition of thyroid  function test (TSH) to recent blood work if possible. - If not possible, perform thyroid  function test with next set of labs in two months.  # Weight loss, unspecified Significant weight loss from 220 lbs to 174 lbs. Loss of appetite, difficulty eating. Uses marijuana for appetite stimulation.  # Unspecified wheezing Wheezing noted, not previously reported. No inhalers in use. Not linked to outdoor exposure or allergies. - Note for Dr. Annabell Key to evaluate wheezing further.   Dispo: f/u in 6 months  Signed, Justin Sos, MD

## 2024-01-14 NOTE — Patient Instructions (Signed)
 Medication Instructions:  INCREASE YOUR ROSUVASTATIN  TO 10 MG DAILY   *If you need a refill on your cardiac medications before your next appointment, please call your pharmacy*  Lab Work: TSH ADDED TO LABS FROM YESTERDAY   FASTING LP/CMET IN 2 MONTHS   If you have labs (blood work) drawn today and your tests are completely normal, you will receive your results only by: MyChart Message (if you have MyChart) OR A paper copy in the mail If you have any lab test that is abnormal or we need to change your treatment, we will call you to review the results.  Testing/Procedures: NONE   Follow-Up: At [facility], you and your health needs are our priority.  As part of our continuing mission to provide you with exceptional heart care, our providers are all part of one team.  This team includes your primary Cardiologist (physician) and Advanced Practice Providers or APPs (Physician Assistants and Nurse Practitioners) who all work together to provide you with the care you need, when you need it.  Your next appointment:   6 month(s)  Provider:   Maudine Sos, MD, Slater Duncan, NP, or Neomi Banks, NP    We recommend signing up for the patient portal called "MyChart".  Sign up information is provided on this After Visit Summary.  MyChart is used to connect with patients for Virtual Visits (Telemedicine).  Patients are able to view lab/test results, encounter notes, upcoming appointments, etc.  Non-urgent messages can be sent to your provider as well.   To learn more about what you can do with MyChart, go to [email or web address].

## 2024-01-15 ENCOUNTER — Other Ambulatory Visit (HOSPITAL_COMMUNITY): Payer: Self-pay

## 2024-01-17 ENCOUNTER — Other Ambulatory Visit (HOSPITAL_COMMUNITY): Payer: Self-pay

## 2024-01-17 MED ORDER — ARIPIPRAZOLE 2 MG PO TABS
2.0000 mg | ORAL_TABLET | Freq: Every day | ORAL | 0 refills | Status: DC
Start: 2024-01-17 — End: 2024-03-13
  Filled 2024-01-17: qty 30, 30d supply, fill #0

## 2024-01-18 ENCOUNTER — Other Ambulatory Visit (HOSPITAL_COMMUNITY): Payer: Self-pay

## 2024-01-18 ENCOUNTER — Encounter (HOSPITAL_BASED_OUTPATIENT_CLINIC_OR_DEPARTMENT_OTHER): Payer: Self-pay | Admitting: Cardiovascular Disease

## 2024-01-18 LAB — TSH: TSH: 0.251 u[IU]/mL — ABNORMAL LOW (ref 0.450–4.500)

## 2024-01-18 LAB — SPECIMEN STATUS REPORT

## 2024-01-18 MED ORDER — TRESIBA FLEXTOUCH 100 UNIT/ML ~~LOC~~ SOPN
PEN_INJECTOR | SUBCUTANEOUS | 3 refills | Status: AC
Start: 2024-01-18 — End: ?
  Filled 2024-01-18: qty 9, 75d supply, fill #0

## 2024-01-18 MED ORDER — TRESIBA FLEXTOUCH 100 UNIT/ML ~~LOC~~ SOPN
12.0000 [IU] | PEN_INJECTOR | Freq: Every evening | SUBCUTANEOUS | 3 refills | Status: AC
Start: 2024-01-18 — End: ?
  Filled 2024-01-18 – 2024-01-21 (×2): qty 12, 100d supply, fill #0
  Filled 2024-08-19 – 2024-08-21 (×3): qty 12, 100d supply, fill #1

## 2024-01-18 MED ORDER — NOVOLOG FLEXPEN 100 UNIT/ML ~~LOC~~ SOPN
2.0000 [IU] | PEN_INJECTOR | Freq: Three times a day (TID) | SUBCUTANEOUS | 5 refills | Status: AC
  Filled 2024-01-18 – 2024-01-21 (×2): qty 9, 60d supply, fill #0

## 2024-01-19 DIAGNOSIS — F322 Major depressive disorder, single episode, severe without psychotic features: Secondary | ICD-10-CM | POA: Diagnosis not present

## 2024-01-20 ENCOUNTER — Telehealth (HOSPITAL_BASED_OUTPATIENT_CLINIC_OR_DEPARTMENT_OTHER): Payer: Self-pay | Admitting: *Deleted

## 2024-01-20 NOTE — Telephone Encounter (Signed)
 Called results to patient and left results on VM (ok per DPR), instructions left to call office back if patient has any questions!

## 2024-01-20 NOTE — Telephone Encounter (Signed)
 Called [facility] 4/25 and had TSH added to labs for significant weight loss Called 4/30 and has Thyroid  panel added for abnormal TSH   Both add on forms signed and faxed back, confirmation received

## 2024-01-21 ENCOUNTER — Other Ambulatory Visit (HOSPITAL_COMMUNITY): Payer: Self-pay

## 2024-01-21 ENCOUNTER — Telehealth (HOSPITAL_BASED_OUTPATIENT_CLINIC_OR_DEPARTMENT_OTHER): Payer: Self-pay

## 2024-01-21 LAB — SPECIMEN STATUS REPORT

## 2024-01-21 LAB — THYROID PANEL
Free Thyroxine Index: 2.1 (ref 1.2–4.9)
T3 Uptake Ratio: 32 % (ref 24–39)
T4, Total: 6.5 ug/dL (ref 4.5–12.0)

## 2024-01-21 NOTE — Telephone Encounter (Signed)
 Called and left voice message to call back

## 2024-01-21 NOTE — Telephone Encounter (Signed)
-----   Message from Clearnce Curia sent at 01/21/2024  9:33 AM EDT ----- Normal T3/T4. Labs consistent with subclinical hyperthyroidism. No medical therapy at this time. Recommend follow up with PCP regarding weight loss. Labs routed to PCP team so they are aware.

## 2024-01-25 ENCOUNTER — Other Ambulatory Visit (HOSPITAL_COMMUNITY): Payer: Self-pay

## 2024-01-25 NOTE — Telephone Encounter (Signed)
2nd call attempt, no answer, unable to leave VM.

## 2024-01-26 NOTE — Telephone Encounter (Signed)
3rd call attempt, Results called to patient who verbalizes understanding!

## 2024-01-27 ENCOUNTER — Other Ambulatory Visit (HOSPITAL_COMMUNITY): Payer: Self-pay

## 2024-01-28 DIAGNOSIS — R946 Abnormal results of thyroid function studies: Secondary | ICD-10-CM | POA: Diagnosis not present

## 2024-01-28 DIAGNOSIS — R7989 Other specified abnormal findings of blood chemistry: Secondary | ICD-10-CM | POA: Diagnosis not present

## 2024-01-31 DIAGNOSIS — E7800 Pure hypercholesterolemia, unspecified: Secondary | ICD-10-CM | POA: Diagnosis not present

## 2024-01-31 DIAGNOSIS — I10 Essential (primary) hypertension: Secondary | ICD-10-CM | POA: Diagnosis not present

## 2024-01-31 DIAGNOSIS — E0590 Thyrotoxicosis, unspecified without thyrotoxic crisis or storm: Secondary | ICD-10-CM | POA: Diagnosis not present

## 2024-01-31 DIAGNOSIS — E1165 Type 2 diabetes mellitus with hyperglycemia: Secondary | ICD-10-CM | POA: Diagnosis not present

## 2024-01-31 DIAGNOSIS — N1831 Chronic kidney disease, stage 3a: Secondary | ICD-10-CM | POA: Diagnosis not present

## 2024-01-31 DIAGNOSIS — S98132A Complete traumatic amputation of one left lesser toe, initial encounter: Secondary | ICD-10-CM | POA: Diagnosis not present

## 2024-02-07 ENCOUNTER — Ambulatory Visit: Admitting: Gastroenterology

## 2024-02-08 ENCOUNTER — Other Ambulatory Visit (HOSPITAL_COMMUNITY): Payer: Self-pay

## 2024-02-09 ENCOUNTER — Other Ambulatory Visit (HOSPITAL_COMMUNITY): Payer: Self-pay

## 2024-02-09 MED ORDER — TRAZODONE HCL 50 MG PO TABS
50.0000 mg | ORAL_TABLET | Freq: Every evening | ORAL | 0 refills | Status: DC | PRN
  Filled 2024-02-09: qty 90, 90d supply, fill #0

## 2024-02-29 DIAGNOSIS — E0590 Thyrotoxicosis, unspecified without thyrotoxic crisis or storm: Secondary | ICD-10-CM | POA: Diagnosis not present

## 2024-03-08 ENCOUNTER — Other Ambulatory Visit (HOSPITAL_COMMUNITY): Payer: Self-pay

## 2024-03-13 ENCOUNTER — Other Ambulatory Visit (HOSPITAL_COMMUNITY): Payer: Self-pay

## 2024-03-13 MED ORDER — ARIPIPRAZOLE 2 MG PO TABS
2.0000 mg | ORAL_TABLET | Freq: Every day | ORAL | 0 refills | Status: DC
  Filled 2024-03-13: qty 30, 30d supply, fill #0

## 2024-03-16 ENCOUNTER — Other Ambulatory Visit (HOSPITAL_COMMUNITY): Payer: Self-pay

## 2024-03-17 ENCOUNTER — Other Ambulatory Visit (HOSPITAL_BASED_OUTPATIENT_CLINIC_OR_DEPARTMENT_OTHER): Payer: Self-pay

## 2024-03-17 ENCOUNTER — Other Ambulatory Visit (HOSPITAL_COMMUNITY): Payer: Self-pay

## 2024-03-17 DIAGNOSIS — R351 Nocturia: Secondary | ICD-10-CM | POA: Diagnosis not present

## 2024-03-17 DIAGNOSIS — N5201 Erectile dysfunction due to arterial insufficiency: Secondary | ICD-10-CM | POA: Diagnosis not present

## 2024-03-17 DIAGNOSIS — N3941 Urge incontinence: Secondary | ICD-10-CM | POA: Diagnosis not present

## 2024-03-17 DIAGNOSIS — N401 Enlarged prostate with lower urinary tract symptoms: Secondary | ICD-10-CM | POA: Diagnosis not present

## 2024-03-17 MED ORDER — SILDENAFIL CITRATE 20 MG PO TABS
20.0000 mg | ORAL_TABLET | ORAL | 3 refills | Status: AC | PRN
  Filled 2024-03-17: qty 90, 18d supply, fill #0
  Filled 2024-05-07 – 2024-05-09 (×2): qty 90, 18d supply, fill #1
  Filled 2024-05-26: qty 90, 30d supply, fill #2
  Filled 2024-07-18: qty 90, 30d supply, fill #3

## 2024-04-17 ENCOUNTER — Other Ambulatory Visit (HOSPITAL_COMMUNITY): Payer: Self-pay

## 2024-04-17 DIAGNOSIS — L821 Other seborrheic keratosis: Secondary | ICD-10-CM | POA: Diagnosis not present

## 2024-04-17 DIAGNOSIS — R296 Repeated falls: Secondary | ICD-10-CM | POA: Diagnosis not present

## 2024-04-17 DIAGNOSIS — F5101 Primary insomnia: Secondary | ICD-10-CM | POA: Diagnosis not present

## 2024-04-17 DIAGNOSIS — F332 Major depressive disorder, recurrent severe without psychotic features: Secondary | ICD-10-CM | POA: Diagnosis not present

## 2024-04-17 DIAGNOSIS — R8290 Unspecified abnormal findings in urine: Secondary | ICD-10-CM | POA: Diagnosis not present

## 2024-04-17 DIAGNOSIS — E1140 Type 2 diabetes mellitus with diabetic neuropathy, unspecified: Secondary | ICD-10-CM | POA: Diagnosis not present

## 2024-04-24 DIAGNOSIS — F32A Depression, unspecified: Secondary | ICD-10-CM | POA: Diagnosis not present

## 2024-04-24 DIAGNOSIS — F332 Major depressive disorder, recurrent severe without psychotic features: Secondary | ICD-10-CM | POA: Diagnosis not present

## 2024-04-24 DIAGNOSIS — K219 Gastro-esophageal reflux disease without esophagitis: Secondary | ICD-10-CM | POA: Diagnosis not present

## 2024-04-24 DIAGNOSIS — R296 Repeated falls: Secondary | ICD-10-CM | POA: Diagnosis not present

## 2024-04-24 DIAGNOSIS — Z7984 Long term (current) use of oral hypoglycemic drugs: Secondary | ICD-10-CM | POA: Diagnosis not present

## 2024-04-24 DIAGNOSIS — F5101 Primary insomnia: Secondary | ICD-10-CM | POA: Diagnosis not present

## 2024-04-24 DIAGNOSIS — L821 Other seborrheic keratosis: Secondary | ICD-10-CM | POA: Diagnosis not present

## 2024-04-24 DIAGNOSIS — I10 Essential (primary) hypertension: Secondary | ICD-10-CM | POA: Diagnosis not present

## 2024-04-24 DIAGNOSIS — M51369 Other intervertebral disc degeneration, lumbar region without mention of lumbar back pain or lower extremity pain: Secondary | ICD-10-CM | POA: Diagnosis not present

## 2024-04-24 DIAGNOSIS — I2510 Atherosclerotic heart disease of native coronary artery without angina pectoris: Secondary | ICD-10-CM | POA: Diagnosis not present

## 2024-04-24 DIAGNOSIS — E785 Hyperlipidemia, unspecified: Secondary | ICD-10-CM | POA: Diagnosis not present

## 2024-04-24 DIAGNOSIS — Z7982 Long term (current) use of aspirin: Secondary | ICD-10-CM | POA: Diagnosis not present

## 2024-04-24 DIAGNOSIS — E1140 Type 2 diabetes mellitus with diabetic neuropathy, unspecified: Secondary | ICD-10-CM | POA: Diagnosis not present

## 2024-04-30 ENCOUNTER — Other Ambulatory Visit (HOSPITAL_BASED_OUTPATIENT_CLINIC_OR_DEPARTMENT_OTHER): Payer: Self-pay | Admitting: Cardiovascular Disease

## 2024-05-01 ENCOUNTER — Other Ambulatory Visit (HOSPITAL_BASED_OUTPATIENT_CLINIC_OR_DEPARTMENT_OTHER): Payer: Self-pay

## 2024-05-01 ENCOUNTER — Other Ambulatory Visit (HOSPITAL_COMMUNITY): Payer: Self-pay

## 2024-05-01 MED ORDER — METOPROLOL SUCCINATE ER 50 MG PO TB24
50.0000 mg | ORAL_TABLET | Freq: Every day | ORAL | 0 refills | Status: DC
  Filled 2024-05-01: qty 90, 90d supply, fill #0

## 2024-05-02 ENCOUNTER — Other Ambulatory Visit (HOSPITAL_COMMUNITY): Payer: Self-pay

## 2024-05-02 DIAGNOSIS — E1165 Type 2 diabetes mellitus with hyperglycemia: Secondary | ICD-10-CM | POA: Diagnosis not present

## 2024-05-02 DIAGNOSIS — E0590 Thyrotoxicosis, unspecified without thyrotoxic crisis or storm: Secondary | ICD-10-CM | POA: Diagnosis not present

## 2024-05-02 DIAGNOSIS — S98132A Complete traumatic amputation of one left lesser toe, initial encounter: Secondary | ICD-10-CM | POA: Diagnosis not present

## 2024-05-02 DIAGNOSIS — I739 Peripheral vascular disease, unspecified: Secondary | ICD-10-CM | POA: Diagnosis not present

## 2024-05-02 DIAGNOSIS — E7800 Pure hypercholesterolemia, unspecified: Secondary | ICD-10-CM | POA: Diagnosis not present

## 2024-05-02 DIAGNOSIS — I10 Essential (primary) hypertension: Secondary | ICD-10-CM | POA: Diagnosis not present

## 2024-05-02 DIAGNOSIS — N1831 Chronic kidney disease, stage 3a: Secondary | ICD-10-CM | POA: Diagnosis not present

## 2024-05-02 DIAGNOSIS — K3184 Gastroparesis: Secondary | ICD-10-CM | POA: Diagnosis not present

## 2024-05-02 MED ORDER — METFORMIN HCL ER 500 MG PO TB24
500.0000 mg | ORAL_TABLET | Freq: Two times a day (BID) | ORAL | 5 refills | Status: AC
  Filled 2024-05-02: qty 180, 90d supply, fill #0

## 2024-05-08 ENCOUNTER — Other Ambulatory Visit (HOSPITAL_COMMUNITY): Payer: Self-pay

## 2024-05-09 ENCOUNTER — Other Ambulatory Visit (HOSPITAL_COMMUNITY): Payer: Self-pay

## 2024-05-15 ENCOUNTER — Other Ambulatory Visit (HOSPITAL_COMMUNITY): Payer: Self-pay

## 2024-05-16 ENCOUNTER — Other Ambulatory Visit (HOSPITAL_COMMUNITY): Payer: Self-pay

## 2024-05-18 ENCOUNTER — Other Ambulatory Visit (HOSPITAL_COMMUNITY): Payer: Self-pay

## 2024-05-18 ENCOUNTER — Other Ambulatory Visit: Payer: Self-pay

## 2024-05-18 MED ORDER — ARIPIPRAZOLE 2 MG PO TABS
2.0000 mg | ORAL_TABLET | Freq: Every day | ORAL | 0 refills | Status: DC
  Filled 2024-05-18: qty 30, 30d supply, fill #0

## 2024-05-19 ENCOUNTER — Telehealth: Payer: Self-pay | Admitting: Pharmacist

## 2024-05-19 NOTE — Progress Notes (Signed)
   05/19/2024  Patient ID: Justin Key, male   DOB: May 09, 1951, 73 y.o.   MRN: [id number]  Patient overdue for Rosuvastatin  refill.  Last filled 01/17/24 for 90DS. On 4/25- this was the date that Cardiology increased dose from 5 to 10mg  (med was sent in).  Do not see any other changes since then. Can you check?  Thank you!!  Januvia - last filled 3/19 90DS (due)  Metformin - 8/12 90DS ; Tresiba  + Novolog  filled for 2025  Glimepiride - last filled 2/5 90DS (due)  Rosuvastatin - last filled 4/28 90DS (due)    Message sent to [facility] team to fill patient's rosuvastatin . Will see if he picks up the medication in 3 days.  Update from 8/29:  Tried calling patient. Unable to reach at this time. Left message requesting call back at earliest convenience to discuss medications further. Tried calling wife as well per DPR and unable to reach. If patient returns my call, he can reach me at [phone].   Aloysius Lewis, PharmD [facility]  Phone Number: [phone]

## 2024-05-21 ENCOUNTER — Emergency Department (HOSPITAL_COMMUNITY)
Admission: EM | Admit: 2024-05-21 | Discharge: 2024-05-21 | Disposition: A | Discharge status: Home / Self Care | Source: Home / Self Care

## 2024-05-21 DIAGNOSIS — F332 Major depressive disorder, recurrent severe without psychotic features: Secondary | ICD-10-CM | POA: Diagnosis not present

## 2024-05-22 ENCOUNTER — Other Ambulatory Visit (HOSPITAL_COMMUNITY): Payer: Self-pay | Admitting: Psychiatry

## 2024-05-26 ENCOUNTER — Other Ambulatory Visit (HOSPITAL_COMMUNITY): Payer: Self-pay

## 2024-05-29 ENCOUNTER — Other Ambulatory Visit (HOSPITAL_COMMUNITY): Payer: Self-pay

## 2024-05-30 ENCOUNTER — Other Ambulatory Visit (HOSPITAL_COMMUNITY): Payer: Self-pay

## 2024-06-01 ENCOUNTER — Other Ambulatory Visit (HOSPITAL_COMMUNITY): Payer: Self-pay

## 2024-06-02 ENCOUNTER — Other Ambulatory Visit (HOSPITAL_COMMUNITY): Payer: Self-pay

## 2024-06-05 ENCOUNTER — Other Ambulatory Visit (HOSPITAL_COMMUNITY): Payer: Self-pay

## 2024-06-05 MED ORDER — SERTRALINE HCL 100 MG PO TABS
200.0000 mg | ORAL_TABLET | Freq: Every day | ORAL | 0 refills | Status: AC
  Filled 2024-06-05: qty 180, 90d supply, fill #0

## 2024-06-07 ENCOUNTER — Other Ambulatory Visit (HOSPITAL_COMMUNITY): Payer: Self-pay

## 2024-06-07 ENCOUNTER — Ambulatory Visit (INDEPENDENT_AMBULATORY_CARE_PROVIDER_SITE_OTHER)

## 2024-06-07 ENCOUNTER — Ambulatory Visit
Admission: EM | Admit: 2024-06-07 | Discharge: 2024-06-07 | Disposition: A | Discharge status: Home / Self Care | Attending: Family Medicine | Admitting: Family Medicine

## 2024-06-07 DIAGNOSIS — M85871 Other specified disorders of bone density and structure, right ankle and foot: Secondary | ICD-10-CM | POA: Diagnosis not present

## 2024-06-07 DIAGNOSIS — M79671 Pain in right foot: Secondary | ICD-10-CM

## 2024-06-07 DIAGNOSIS — S8011XA Contusion of right lower leg, initial encounter: Secondary | ICD-10-CM

## 2024-06-07 DIAGNOSIS — M79661 Pain in right lower leg: Secondary | ICD-10-CM

## 2024-06-07 DIAGNOSIS — M7989 Other specified soft tissue disorders: Secondary | ICD-10-CM

## 2024-06-07 DIAGNOSIS — L089 Local infection of the skin and subcutaneous tissue, unspecified: Secondary | ICD-10-CM | POA: Diagnosis not present

## 2024-06-07 DIAGNOSIS — T148XXA Other injury of unspecified body region, initial encounter: Secondary | ICD-10-CM

## 2024-06-07 MED ORDER — BACITRACIN ZINC 500 UNIT/GM EX OINT
1.0000 | TOPICAL_OINTMENT | Freq: Two times a day (BID) | CUTANEOUS | 0 refills | Status: AC
  Filled 2024-06-07 – 2024-06-08 (×2): qty 112, 62d supply, fill #0
  Filled 2024-06-08: qty 112, 30d supply, fill #0

## 2024-06-07 MED ORDER — DOXYCYCLINE HYCLATE 100 MG PO CAPS
100.0000 mg | ORAL_CAPSULE | Freq: Two times a day (BID) | ORAL | 0 refills | Status: DC
Start: 2024-06-07 — End: 2024-08-02
  Filled 2024-06-07: qty 20, 10d supply, fill #0

## 2024-06-07 NOTE — ED Triage Notes (Signed)
 Pt reports he has swelling, redness and pain in right lower leg and swelling in the right ankle and right foot x 2 weeks after he hit the right leg with a table. Pt has being cleaning are with soap and water

## 2024-06-07 NOTE — ED Provider Notes (Signed)
 [facility]  Note:  This document was prepared using Conservation officer, historic buildings and may include unintentional dictation errors.  MRN: [id number] DOB: Jan 19, 1951  Subjective:   Justin Key is a 73 y.o. male presenting for 2-week history of persistent right lower leg pain, swelling, some redness.  Patient reports that he scraped his foot and hit it against a table.  Has been getting consistent wound care at home.  However, there is concerned that there is secondary infection of the right lower leg wounds.  Has a history of peripheral arterial disease.  Patient is also diabetic treated with insulin .  No current facility-administered medications for this encounter.  Current Outpatient Medications:    ARIPiprazole  (ABILIFY ) 2 MG tablet, Take 1 tablet (2 mg total) by mouth daily., Disp: 30 tablet, Rfl: 0   ARIPiprazole  (ABILIFY ) 2 MG tablet, Take 1 tablet (2 mg total) by mouth daily. **Please schedule annual visit.**, Disp: 30 tablet, Rfl: 0   aspirin  EC 81 MG tablet, Take 1 tablet (81 mg total) by mouth daily. Swallow whole., Disp: 30 tablet, Rfl: 12   Cholecalciferol (VITAMIN D3) 1000 units CAPS, Take 1,000 Units by mouth daily after breakfast., Disp: , Rfl:    docusate sodium  (COLACE) 100 MG capsule, Take 1 capsule (100 mg total) by mouth every 12 (twelve) hours., Disp: 180 capsule, Rfl: 0   glimepiride  (AMARYL ) 4 MG tablet, Take 1 tablet (4 mg total) by mouth daily with breakfast., Disp: 30 tablet, Rfl: 0   glucose blood (FREESTYLE LITE) test strip, Use to check blood glucose 2 times daily, Disp: 200 strip, Rfl: 4   glucose blood test strip, use 1 strip to check blood glucose 2 (two) times daily., Disp: 200 each, Rfl: 3   glucose blood test strip, Use to check blood sugar 2 times a day, Disp: 200 each, Rfl: 3   ibuprofen  (ADVIL ) 600 MG tablet, Take 1 tablet (600 mg total) by mouth every 6 (six) hours as needed for moderate pain (pain score 4-6)., Disp: 30  tablet, Rfl: 0   insulin  aspart (NOVOLOG  FLEXPEN) 100 UNIT/ML FlexPen, Inject 2-5 Units into the skin 3 (three) times daily before meals., Disp: 9 mL, Rfl: 5   insulin  degludec (TRESIBA  FLEXTOUCH) 100 UNIT/ML FlexTouch Pen, Inject 12 Units into the skin every evening., Disp: 12 mL, Rfl: 3   insulin  degludec (TRESIBA  FLEXTOUCH) 100 UNIT/ML FlexTouch Pen, Inject 12 Units into the skin every evening., Disp: 12 mL, Rfl: 3   insulin  glargine (LANTUS ) 100 UNIT/ML injection, Inject 0.12 mLs (12 Units total) into the skin at bedtime., Disp: 10 mL, Rfl: 11   Insulin  Pen Needle 32G X 4 MM MISC, Use once a day as directed., Disp: 100 each, Rfl: 6   lidocaine  (LIDODERM ) 5 %, Apply 2 patches to affected area daily for up to 12 hours.  Remove for 12 hours & repeat as directed. (Patient taking differently: Place 1 patch onto the skin daily as needed (pain).), Disp: 60 patch, Rfl: 11   metFORMIN  (GLUCOPHAGE -XR) 500 MG 24 hr tablet, Take 1 tablet (500 mg total) by mouth 2 (two) times daily., Disp: 60 tablet, Rfl: 6   metFORMIN  (GLUCOPHAGE -XR) 500 MG 24 hr tablet, Take 1 tablet (500 mg total) by mouth 2 (two) times daily., Disp: 180 tablet, Rfl: 5   metoCLOPramide  (REGLAN ) 5 MG tablet, Take 1 tablet (5 mg total) by mouth at bedtime regularly. May also take 1 tablet (5 mg total) every 6 (six) hours as  needed for nausea and vomiting, Disp: 60 tablet, Rfl: 5   metoprolol  succinate (TOPROL -XL) 50 MG 24 hr tablet, Take 1 tablet (50 mg total) by mouth daily. (discontinue 25 mg RX), Disp: 90 tablet, Rfl: 0   nitroGLYCERIN  (NITROSTAT ) 0.4 MG SL tablet, Place 1 tablet (0.4 mg total) under the tongue every 5 (five) minutes as needed for chest pain., Disp: 30 tablet, Rfl: 0   nitroGLYCERIN  (NITROSTAT ) 0.4 MG SL tablet, Place 1 tablet (0.4 mg total) under the tongue every 5 (five) minutes as needed for chest pain, Disp: 25 tablet, Rfl: 0   pantoprazole  (PROTONIX ) 40 MG tablet, Take 1 tablet (40 mg total) by mouth 2 (two) times  daily., Disp: 180 tablet, Rfl: 0   rosuvastatin  (CRESTOR ) 10 MG tablet, Take 1 tablet (10 mg total) by mouth daily., Disp: 90 tablet, Rfl: 3   sertraline  (ZOLOFT ) 100 MG tablet, Take 2 tablets (200 mg total) by mouth daily., Disp: 180 tablet, Rfl: 0   sildenafil  (REVATIO ) 20 MG tablet, Take 1-5 tablets (20-100 mg total) by mouth as needed as instructed., Disp: 90 tablet, Rfl: 3   sitaGLIPtin  (JANUVIA ) 100 MG tablet, Take 1 tablet (100 mg total) by mouth daily., Disp: 90 tablet, Rfl: 4   traZODone  (DESYREL ) 50 MG tablet, Take 1 tablet (50 mg total) by mouth at bedtime as needed. Need follow up visit., Disp: 90 tablet, Rfl: 0   TYLENOL  500 MG tablet, Take 500-1,000 mg by mouth every 6 (six) hours as needed for mild pain (pain score 1-3) or headache., Disp: , Rfl:    Allergies  Allergen Reactions   Humibid La [Guaifenesin ] Other (See Comments)    Hallucinations     Past Medical History:  Diagnosis Date   Adenomatous polyps    Anxiety    Arthritis    Back pain    Barrett's esophagus    EGD 2006 and 2008 on biopsies, no signs on subsequent exams   Coronary artery disease    Depression    Diabetes mellitus without complication (HCC)    Failure to thrive in adult    Gastroparesis    GERD (gastroesophageal reflux disease)    Hyperlipidemia    Hypertension    Lumbar disc disease    Lumbar spinal stenosis    Myocardial infarction [facility]) 1997   Peripheral arterial disease (HCC)    Peripheral vascular disease (HCC)    Thoracic myelopathy 2020     Past Surgical History:  Procedure Laterality Date   ABDOMINAL AORTOGRAM W/LOWER EXTREMITY Left 05/15/2022   Procedure: ABDOMINAL AORTOGRAM W/LOWER EXTREMITY;  Surgeon: Eliza Lonni RAMAN, MD;  Location: [facility] INVASIVE CV LAB;  Service: Cardiovascular;  Laterality: Left;   AMPUTATION TOE Left 08/21/2022   Procedure: AMPUTATION TOE INTERPHALANGEAL JOINT BIG TOE;  Surgeon: Gershon Donnice SAUNDERS, DPM;  Location: [facility] ORS;  Service: Podiatry;   Laterality: Left;   BACK SURGERY     microdiskectomy x2   BIOPSY  06/21/2018   Procedure: BIOPSY;  Surgeon: Dianna Specking, MD;  Location: [facility];  Service: Endoscopy;;   BIOPSY  02/06/2020   Procedure: BIOPSY;  Surgeon: Dianna Specking, MD;  Location: [facility];  Service: Endoscopy;;   CATARACT EXTRACTION W/ INTRAOCULAR LENS IMPLANT Bilateral    COLONOSCOPY  07/12/2012   Procedure: COLONOSCOPY;  Surgeon: Specking KYM Dianna, MD;  Location: [facility];  Service: Endoscopy;  Laterality: N/A;   COLONOSCOPY WITH PROPOFOL  N/A 06/21/2018   Procedure: COLONOSCOPY WITH PROPOFOL ;  Surgeon: Dianna Specking, MD;  Location: [facility];  Service: Endoscopy;  Laterality: N/A;   COLONOSCOPY WITH PROPOFOL  N/A 02/06/2020   Procedure: COLONOSCOPY WITH PROPOFOL ;  Surgeon: Dianna Specking, MD;  Location: [facility];  Service: Endoscopy;  Laterality: N/A;   CORONARY ULTRASOUND/IVUS N/A 08/24/2018   Procedure: Intravascular Ultrasound/IVUS;  Surgeon: Claudene Victory ORN, MD;  Location: [facility] INVASIVE CV LAB;  Service: Cardiovascular;  Laterality: N/A;   ESOPHAGEAL MANOMETRY N/A 11/15/2017   Procedure: ESOPHAGEAL MANOMETRY (EM);  Surgeon: Dianna Specking, MD;  Location: [facility];  Service: Endoscopy;  Laterality: N/A;   ESOPHAGEAL MANOMETRY  01/29/2021   Procedure: ESOPHAGEAL MANOMETRY (EM);  Surgeon: Avram Lupita BRAVO, MD;  Location: [facility];  Service: Endoscopy;;   ESOPHAGOGASTRODUODENOSCOPY  07/12/2012   Procedure: ESOPHAGOGASTRODUODENOSCOPY (EGD);  Surgeon: Specking KYM Dianna, MD;  Location: [facility];  Service: Endoscopy;  Laterality: N/A;   ESOPHAGOGASTRODUODENOSCOPY (EGD) WITH PROPOFOL  N/A 06/21/2018   Procedure: ESOPHAGOGASTRODUODENOSCOPY (EGD) WITH PROPOFOL ;  Surgeon: Dianna Specking, MD;  Location: [facility];  Service: Endoscopy;  Laterality: N/A;   ESOPHAGOGASTRODUODENOSCOPY (EGD) WITH PROPOFOL  N/A 02/06/2020   Procedure: ESOPHAGOGASTRODUODENOSCOPY (EGD) WITH PROPOFOL ;   Surgeon: Dianna Specking, MD;  Location: [facility];  Service: Endoscopy;  Laterality: N/A;   FEMORAL-TIBIAL BYPASS GRAFT Left 06/23/2022   Procedure: BYPASS GRAFT FEMORAL-TIBIAL ARTERY;  Surgeon: Eliza Lonni RAMAN, MD;  Location[facility];  Service: Vascular;  Laterality: Left;   FOREIGN BODY RETRIEVAL N/A 02/06/2020   Procedure: FOREIGN BODY RETRIEVAL;  Surgeon: Dianna Specking, MD;  Location: [facility];  Service: Endoscopy;  Laterality: N/A;   KNEE ARTHROSCOPY  09/21/1986   Left   LEFT HEART CATH AND CORONARY ANGIOGRAPHY N/A 08/24/2018   Procedure: LEFT HEART CATH AND CORONARY ANGIOGRAPHY;  Surgeon: Claudene Victory ORN, MD;  Location: [facility] INVASIVE CV LAB;  Service: Cardiovascular;  Laterality: N/A;   POLYPECTOMY  06/21/2018   Procedure: POLYPECTOMY;  Surgeon: Dianna Specking, MD;  Location: [facility];  Service: Endoscopy;;   POLYPECTOMY  02/06/2020   Procedure: POLYPECTOMY;  Surgeon: Dianna Specking, MD;  Location: [facility];  Service: Endoscopy;;   SPINAL CORD STIMULATOR IMPLANT     SPINAL CORD STIMULATOR REMOVAL     SPINAL FUSION     x 3   THORACIC DISCECTOMY N/A 03/15/2019   Procedure: Laminectomy and Foraminotomy - Thoracic eleven -twelve;  Surgeon: Onetha Kuba, MD;  Location: [facility] OR;  Service: Neurosurgery;  Laterality: N/A;    Family History  Problem Relation Age of Onset   Breast cancer Mother    Lung cancer Father    Liver disease Brother    Colon cancer Neg Hx     Social History   Tobacco Use   Smoking status: Former    Current packs/day: 0.00    Average packs/day: 1 pack/day for 20.0 years (20.0 ttl pk-yrs)    Types: Cigarettes    Start date: 04/09/1992    Quit date: 04/09/2012    Years since quitting: 12.1   Smokeless tobacco: Never   Tobacco comments:    smoking THC for pain/appetite   Vaping Use   Vaping status: Never Used  Substance Use Topics   Alcohol  use: Yes    Comment: Maybe 1 glass of wine once a month, socially   Drug use: Yes     Types: Marijuana    Comment: lunch and bedtime, occasionally, just to have an appetite. Not every day per pt    ROS   Objective:   Vitals: BP (!) 147/65 (BP Location: Left Arm)   Pulse 64   Temp (!) 97.5  F (36.4 C) (Oral)   Resp 18   SpO2 95%   Physical Exam Constitutional:      General: He is not in acute distress.    Appearance: Normal appearance. He is well-developed and normal weight. He is not ill-appearing, toxic-appearing or diaphoretic.  HENT:     Head: Normocephalic and atraumatic.     Right Ear: External ear normal.     Left Ear: External ear normal.     Nose: Nose normal.     Mouth/Throat:     Pharynx: Oropharynx is clear.  Eyes:     General: No scleral icterus.       Right eye: No discharge.        Left eye: No discharge.     Extraocular Movements: Extraocular movements intact.  Cardiovascular:     Rate and Rhythm: Normal rate.  Pulmonary:     Effort: Pulmonary effort is normal.  Musculoskeletal:     Cervical back: Normal range of motion.       Legs:     Comments: 1+ swelling of the right foot up to the proximal calf.  Neurological:     Mental Status: He is alert and oriented to person, place, and time.  Psychiatric:        Mood and Affect: Mood normal.        Behavior: Behavior normal.        Thought Content: Thought content normal.        Judgment: Judgment normal.    DG Foot Complete Right Result Date: 06/07/2024 CLINICAL DATA:  right foot pain, swelling EXAM: RIGHT FOOT COMPLETE - 3+ VIEW COMPARISON:  None Available. FINDINGS: Diffuse osteopenia.No acute fracture or dislocation. There is no evidence of arthropathy or other focal bone abnormality. Soft tissue swelling about the dorsum of the foot. No radiopaque foreign body. IMPRESSION: Soft tissue swelling about the dorsum of the foot. No acute fracture or dislocation. Electronically Signed   By: Rogelia Myers M.D.   On: 06/07/2024 16:02   Aspiration: medial right lower extremity.  Verbal  consent obtained.  Site cleansed with iodine .  Topical numbing spray applied.  An 18-gauge needle was used to withdraw 1 cc of fluid from the medial aspect of the right lower extremity.  This turned out to be hematoma.  Patient tolerated this well.  Had minimal tenderness.  Assessment and Plan :   PDMP not reviewed this encounter.  1. Right foot pain   2. Pain and swelling of right lower leg   3. Infected abrasion   4. Hematoma of right lower leg    Will cover for secondary infected abrasion with doxycycline .  General wound care reviewed.  Counseled patient on potential for adverse effects with medications prescribed/recommended today, ER and return-to-clinic precautions discussed, patient verbalized understanding.    Christopher Savannah, [place] 06/07/24 [phone]

## 2024-06-07 NOTE — Discharge Instructions (Addendum)
 Start doxycycline  to address a secondary infected abrasion.  Change your dressing 2-3 times daily. Every time you change your dressing, clean the wound gently with warm water and Dial antibacterial soap. Pat the wound dry, let it breathe for roughly an hour before covering it back up. When you reapply a dressing, apply Bacitracin  ointment to the wound, then cover with non-stick/non-adherent gauze.

## 2024-06-08 ENCOUNTER — Other Ambulatory Visit (HOSPITAL_COMMUNITY): Payer: Self-pay

## 2024-06-08 ENCOUNTER — Ambulatory Visit: Payer: Self-pay | Admitting: Urgent Care

## 2024-06-09 ENCOUNTER — Other Ambulatory Visit (HOSPITAL_COMMUNITY): Payer: Self-pay

## 2024-06-13 ENCOUNTER — Encounter: Payer: Self-pay | Admitting: Internal Medicine

## 2024-06-18 ENCOUNTER — Other Ambulatory Visit (HOSPITAL_COMMUNITY): Payer: Self-pay

## 2024-06-20 ENCOUNTER — Other Ambulatory Visit (HOSPITAL_COMMUNITY): Payer: Self-pay

## 2024-06-20 MED ORDER — TRAZODONE HCL 50 MG PO TABS
50.0000 mg | ORAL_TABLET | Freq: Every day | ORAL | 0 refills | Status: DC
  Filled 2024-06-20: qty 90, 90d supply, fill #0

## 2024-07-05 ENCOUNTER — Other Ambulatory Visit (HOSPITAL_COMMUNITY): Payer: Self-pay

## 2024-07-06 ENCOUNTER — Other Ambulatory Visit (HOSPITAL_COMMUNITY): Payer: Self-pay

## 2024-07-06 MED ORDER — ARIPIPRAZOLE 2 MG PO TABS
2.0000 mg | ORAL_TABLET | Freq: Every day | ORAL | 0 refills | Status: DC
  Filled 2024-07-06: qty 30, 30d supply, fill #0

## 2024-07-13 ENCOUNTER — Ambulatory Visit

## 2024-07-13 ENCOUNTER — Telehealth: Payer: Self-pay | Admitting: *Deleted

## 2024-07-13 NOTE — Progress Notes (Unsigned)
 Justin Key

## 2024-07-13 NOTE — Telephone Encounter (Signed)
 Attempt to reach pt for pre-visit. LM on home # with call back #   Will attempt  other number (Mobile  in profile  Will attempt  Home number to reach again in 5 min \ Third attempt to reach pt with home # for pre-vist unsuccessful. LM with facility # for pt to call back. Instructed pt to call # given by end of the day and reschedule the pre-visit  with RN or the scheduled procedure will be canceled.

## 2024-07-13 NOTE — Telephone Encounter (Signed)
 Inbound call from patients wife stating she had tomorrows date written down so had to reschedule patient for 10/24 at 3:30 pm Please advise  Thank you

## 2024-07-14 ENCOUNTER — Other Ambulatory Visit: Payer: Self-pay

## 2024-07-14 ENCOUNTER — Ambulatory Visit (AMBULATORY_SURGERY_CENTER)

## 2024-07-14 VITALS — Ht 75.0 in | Wt 177.0 lb

## 2024-07-14 DIAGNOSIS — Z860100 Personal history of colon polyps, unspecified: Secondary | ICD-10-CM

## 2024-07-14 MED ORDER — NA SULFATE-K SULFATE-MG SULF 17.5-3.13-1.6 GM/177ML PO SOLN: 1.0000 | Freq: Once | ORAL | 0 refills | Status: AC

## 2024-07-14 NOTE — Progress Notes (Signed)
 Denies allergies to eggs or soy products. Denies complication of anesthesia or sedation. Denies use of weight loss medication. Denies use of O2.   Emmi instructions given for colonoscopy.

## 2024-07-19 ENCOUNTER — Other Ambulatory Visit (HOSPITAL_COMMUNITY): Payer: Self-pay

## 2024-07-20 ENCOUNTER — Other Ambulatory Visit (HOSPITAL_COMMUNITY): Payer: Self-pay

## 2024-07-22 ENCOUNTER — Other Ambulatory Visit (HOSPITAL_COMMUNITY): Payer: Self-pay

## 2024-07-25 ENCOUNTER — Encounter: Payer: Self-pay | Admitting: Internal Medicine

## 2024-07-28 ENCOUNTER — Other Ambulatory Visit (HOSPITAL_COMMUNITY): Payer: Self-pay

## 2024-07-29 ENCOUNTER — Other Ambulatory Visit (HOSPITAL_COMMUNITY): Payer: Self-pay

## 2024-07-31 ENCOUNTER — Other Ambulatory Visit (HOSPITAL_COMMUNITY): Payer: Self-pay

## 2024-07-31 MED ORDER — PANTOPRAZOLE SODIUM 40 MG PO TBEC
40.0000 mg | DELAYED_RELEASE_TABLET | Freq: Every day | ORAL | 1 refills | Status: AC
  Filled 2024-07-31: qty 90, 90d supply, fill #0
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
## 2024-08-02 ENCOUNTER — Encounter: Payer: Self-pay | Admitting: Internal Medicine

## 2024-08-02 ENCOUNTER — Ambulatory Visit (AMBULATORY_SURGERY_CENTER): Admitting: Internal Medicine

## 2024-08-02 ENCOUNTER — Other Ambulatory Visit (INDEPENDENT_AMBULATORY_CARE_PROVIDER_SITE_OTHER)

## 2024-08-02 VITALS — BP 196/76 | HR 78 | Temp 97.2°F | Resp 24 | Ht 75.0 in | Wt 177.0 lb

## 2024-08-02 DIAGNOSIS — K5730 Diverticulosis of large intestine without perforation or abscess without bleeding: Secondary | ICD-10-CM | POA: Diagnosis not present

## 2024-08-02 DIAGNOSIS — Z1211 Encounter for screening for malignant neoplasm of colon: Secondary | ICD-10-CM

## 2024-08-02 DIAGNOSIS — E119 Type 2 diabetes mellitus without complications: Secondary | ICD-10-CM | POA: Diagnosis not present

## 2024-08-02 DIAGNOSIS — K648 Other hemorrhoids: Secondary | ICD-10-CM

## 2024-08-02 DIAGNOSIS — D124 Benign neoplasm of descending colon: Secondary | ICD-10-CM

## 2024-08-02 DIAGNOSIS — D123 Benign neoplasm of transverse colon: Secondary | ICD-10-CM

## 2024-08-02 DIAGNOSIS — K5520 Angiodysplasia of colon without hemorrhage: Secondary | ICD-10-CM

## 2024-08-02 DIAGNOSIS — D120 Benign neoplasm of cecum: Secondary | ICD-10-CM

## 2024-08-02 DIAGNOSIS — F419 Anxiety disorder, unspecified: Secondary | ICD-10-CM | POA: Diagnosis not present

## 2024-08-02 DIAGNOSIS — G2581 Restless legs syndrome: Secondary | ICD-10-CM

## 2024-08-02 DIAGNOSIS — Z860101 Personal history of adenomatous and serrated colon polyps: Secondary | ICD-10-CM | POA: Diagnosis not present

## 2024-08-02 DIAGNOSIS — I2510 Atherosclerotic heart disease of native coronary artery without angina pectoris: Secondary | ICD-10-CM | POA: Diagnosis not present

## 2024-08-02 DIAGNOSIS — Z860100 Personal history of colon polyps, unspecified: Secondary | ICD-10-CM

## 2024-08-02 DIAGNOSIS — F32A Depression, unspecified: Secondary | ICD-10-CM | POA: Diagnosis not present

## 2024-08-02 DIAGNOSIS — D122 Benign neoplasm of ascending colon: Secondary | ICD-10-CM | POA: Diagnosis not present

## 2024-08-02 LAB — CBC
HCT: 40.9 % (ref 39.0–52.0)
Hemoglobin: 13.5 g/dL (ref 13.0–17.0)
MCHC: 33.1 g/dL (ref 30.0–36.0)
MCV: 84.6 fl (ref 78.0–100.0)
Platelets: 241 K/uL (ref 150.0–400.0)
RBC: 4.84 Mil/uL (ref 4.22–5.81)
RDW: 15.7 % — ABNORMAL HIGH (ref 11.5–15.5)
WBC: 8 K/uL (ref 4.0–10.5)

## 2024-08-02 LAB — IBC + FERRITIN
Ferritin: 45.6 ng/mL (ref 22.0–322.0)
Iron: 60 ug/dL (ref 42–165)
Saturation Ratios: 17.8 % — ABNORMAL LOW (ref 20.0–50.0)
TIBC: 337.4 ug/dL (ref 250.0–450.0)
Transferrin: 241 mg/dL (ref 212.0–360.0)

## 2024-08-02 MED ORDER — SODIUM CHLORIDE 0.9 % IV SOLN: 500.0000 mL | INTRAVENOUS | Status: DC

## 2024-08-02 NOTE — Op Note (Addendum)
 Justin Key Patient Name: Justin Key Procedure Date: 08/02/2024 1:55 PM MRN: [id number] Endoscopist: Lupita FORBES Commander , MD, [id number] Age: 73 Referring MD:  Date of Birth: November 28, 1950 Gender: Male Account #: [id number] Procedure:                Colonoscopy Indications:              Surveillance: Personal history of adenomatous                            polyps on last colonoscopy 3 years ago, Last                            colonoscopy: October 2022 Medicines:                Monitored Anesthesia Care Procedure:                Pre-Anesthesia Assessment:                           - Prior to the procedure, a History and Physical                            was performed, and patient medications and                            allergies were reviewed. The patient's tolerance of                            previous anesthesia was also reviewed. The risks                            and benefits of the procedure and the sedation                            options and risks were discussed with the patient.                            All questions were answered, and informed consent                            was obtained. Prior Anticoagulants: The patient has                            taken no anticoagulant or antiplatelet agents. ASA                            Grade Assessment: III - A patient with severe                            systemic disease. After reviewing the risks and                            benefits, the patient was deemed in satisfactory  condition to undergo the procedure.                           After obtaining informed consent, the colonoscope                            was passed under direct vision. Throughout the                            procedure, the patient's blood pressure, pulse, and                            oxygen saturations were monitored continuously. The                            Olympus CF-HQ190L ([id number])  Colonoscope was                            introduced through the anus and advanced to the the                            cecum, identified by appendiceal orifice and                            ileocecal valve. The colonoscopy was somewhat                            difficult due to a redundant colon and fair prep.                            Successful completion of the procedure was aided by                            lavage and using abdominal binder. The patient                            tolerated the procedure well. The quality of the                            bowel preparation was fair. The ileocecal valve,                            appendiceal orifice, and rectum were photographed.                            The bowel preparation used was SUPREP via split                            dose instruction. Scope In: 2:36:58 PM Scope Out: 3:20:08 PM Scope Withdrawal Time: 0 hours 30 minutes 25 seconds  Total Procedure Duration: 0 hours 43 minutes 10 seconds  Findings:                 The perianal and digital rectal examinations were  normal.                           A 12 mm polyp was found in the proximal transverse                            colon. The polyp was pedunculated. The polyp was                            removed with a hot snare. Resection and retrieval                            were complete. Verification of patient                            identification for the specimen was done. Estimated                            blood loss: none.                           Eleven (11) sessile and semi-pedunculated polyps                            were found in the descending colon, transverse                            colon, ascending colon and cecum. The polyps were 3                            to 12 mm in size. These polyps were removed with a                            cold snare. Resection and retrieval were complete.                             Verification of patient identification for the                            specimen was done. Estimated blood loss was minimal.                           A few small-mouthed diverticula were found in the                            sigmoid colon.                           Internal hemorrhoids were found.                           Multiple angiodysplastic lesions without bleeding                            were found in  the cecum. Complications:            No immediate complications. Estimated Blood Loss:     Estimated blood loss was minimal. Impression:               - Preparation of the colon was fair.                           - One 12 mm polyp in the proximal transverse colon,                            removed with a hot snare. Resected and retrieved.                           - Eleven (11) 3 to 12 mm polyps in the descending                            colon, in the transverse colon, in the ascending                            colon and in the cecum, removed with a cold snare.                            Resected and retrieved.                           - Diverticulosis in the sigmoid colon.                           - Internal hemorrhoids.                           - Multiple non-bleeding colonic angiodysplastic                            lesions.                           - Personal history of colonic polyps. May 2021 25                            mm descending adenoma, 8 other adenomas max 12 mm.                            Fair prep. Previous history of adenoma or adenomas                            a few years before that as well.                           9/22 several adenomas max 10 mm Recommendation:           - Patient has a contact number available for                            emergencies.  The signs and symptoms of potential                            delayed complications were discussed with the                            patient. Return to normal activities tomorrow.                             Written discharge instructions were provided to the                            patient.                           - Resume previous diet.                           - Continue present medications.                           - No aspirin , ibuprofen , naproxen, or other                            non-steroidal anti-inflammatory drugs for 2 weeks                            after polyp removal.                           - Await pathology results.                           - Likely should have repeat colonoscopy 1 year -                            would use abdominal binder again and do dedicated                            double prep (had daily MiraLAx  week before Suprep                            this time)                           - CBC, iron studies today                           - discussed in recovery - 2 children - they hav                            enot had colonoscopy yet ages > 40                           Will refer to genetics given > 10 lifetime adenomas  and have recommended his children get colonoscopy Lupita FORBES Commander, MD 08/02/2024 3:36:20 PM This report has been signed electronically.

## 2024-08-02 NOTE — Progress Notes (Signed)
 Vss nad trans to pacu

## 2024-08-02 NOTE — Progress Notes (Signed)
 Pt's states no medical or surgical changes since previsit or office visit.

## 2024-08-02 NOTE — Patient Instructions (Addendum)
 I removed 12 polyps today.  I will let you know pathology results and when to have another routine colonoscopy by mail and/or My Chart.  You also have something called angiodysplasia or AVMs.  People can leak blood from this and become anemic.  I am going to check your blood counts and iron studies today.  Please go to the lab.  Also saw hemorrhoids and diverticulosis.  Please read the handouts.  I appreciate the opportunity to care for you. Lupita CHARLENA Commander, MD, FACG  YOU HAD AN ENDOSCOPIC PROCEDURE TODAY AT THE [facility]:   Refer to the procedure report that was given to you for any specific questions about what was found during the examination.  If the procedure report does not answer your questions, please call your gastroenterologist to clarify.  If you requested that your care partner not be given the details of your procedure findings, then the procedure report has been included in a sealed envelope for you to review at your convenience later.  YOU SHOULD EXPECT: Some feelings of bloating in the abdomen. Passage of more gas than usual.  Walking can help get rid of the air that was put into your GI tract during the procedure and reduce the bloating. If you had a lower endoscopy (such as a colonoscopy or flexible sigmoidoscopy) you may notice spotting of blood in your stool or on the toilet paper. If you underwent a bowel prep for your procedure, you may not have a normal bowel movement for a few days.  Please Note:  You might notice some irritation and congestion in your nose or some drainage.  This is from the oxygen used during your procedure.  There is no need for concern and it should clear up in a day or so.  SYMPTOMS TO REPORT IMMEDIATELY:  Following lower endoscopy (colonoscopy or flexible sigmoidoscopy):  Excessive amounts of blood in the stool  Significant tenderness or worsening of abdominal pains  Swelling of the abdomen that is new, acute  Fever of 100F or  higher  For urgent or emergent issues, a gastroenterologist can be reached at any hour by calling [phone]. Do not use MyChart messaging for urgent concerns.    DIET:  We do recommend a small meal at first, but then you may proceed to your regular diet.  Drink plenty of fluids but you should avoid alcoholic beverages for 24 hours.  ACTIVITY:  You should plan to take it easy for the rest of today and you should NOT DRIVE or use heavy machinery until tomorrow (because of the sedation medicines used during the test).    FOLLOW UP: Our staff will call the number listed on your records the next business day following your procedure.  We will call around 7:15- 8:00 am to check on you and address any questions or concerns that you may have regarding the information given to you following your procedure. If we do not reach you, we will leave a message.     If any biopsies were taken you will be contacted by phone or by letter within the next 1-3 weeks.  Please call us  at [phone] if you have not heard about the biopsies in 3 weeks.    SIGNATURES/CONFIDENTIALITY: You and/or your care partner have signed paperwork which will be entered into your electronic medical record.  These signatures attest to the fact that that the information above on your After Visit Summary has been reviewed and is understood.  Full  responsibility of the confidentiality of this discharge information lies with you and/or your care-partner.  Tissue Growths in the Colon (Colon Polyps): What to Know  Colon polyps are tissue growths inside the colon, which is part of the large intestine. A polyp may be a round bump or a growth that's shaped like a mushroom. You could have one polyp or more than one. Most colon polyps are not cancer. But some colon polyps can become cancerous over time. Finding and removing the polyps early can help prevent this. What are the causes? The exact cause of colon polyps is not known. What  increases the risk? Some things may make you more likely to get colon polyps. These include: Having a family history of colorectal cancer or colon polyps. Being older than 73 years of age. Being younger than 73 years of age and having either of these: A strong family history of colorectal cancer or colon polyps. A genetic condition that puts you at higher risk of getting colon polyps. Having inflammatory bowel disease, such as ulcerative colitis or Crohn's disease. Having certain conditions that are passed down in families, such as: Familial adenomatous polyposis (FAP). Lynch syndrome. Turcot syndrome. Peutz-Jeghers syndrome. MUTYH-associated polyposis (MAP). Certain lifestyle factors, like: Smoking cigarettes or drinking too much alcohol . Not getting enough exercise. Being overweight. Eating a diet that's high in fat and red meat and low in fiber. Having had childhood cancer that was treated with radiation of the belly. What are the signs or symptoms? Many times, there are no symptoms. If you have symptoms, they may include: Blood coming from your rectum when pooping. Blood in your poop. The blood may be bright red or very dark in color. Pain in your belly. A change in bowel habits, such as trouble pooping (constipation) or diarrhea. How is this diagnosed? Colon polyps are diagnosed with a colonoscopy. This is a procedure where a lighted, flexible scope is inserted into the opening of the butt and then passed into the colon to examine the area. Polyps are sometimes found when a colonoscopy is done as part of routine cancer screening tests. How is this treated? Treatment involves removing any polyps that are found. Most polyps can be removed during a colonoscopy. Those polyps will then be tested for cancer. More treatment may be needed depending on the results of testing. Follow these instructions at home: Eating and drinking  Eat foods that are high in fiber, such as fruits,  vegetables, and whole grains. Eat foods that are high in calcium  and vitamin D. Some choices include milk, cheese, yogurt, eggs, liver, fish, and broccoli. Limit foods that are high in fat, such as fried foods and desserts. Limit the amount of red meat, precooked or cured meat, or other processed meat that you eat. These include hot dogs, sausages, bacon, and loaves made of meat. Limit drinks that have sugar in them. Lifestyle Stay at a healthy weight. Lose weight if recommended by your health care provider. Exercise every day or as told by your provider. Do not smoke, vape, or use nicotine or tobacco. Do not drink alcohol  if: Your provider tells you not to drink. You're pregnant, may be pregnant, or plan to become pregnant. If you drink alcohol : Limit how much you have to: 0-1 drink a day if you're male. 0-2 drinks a day if you're male. Know how much alcohol  is in your drink. In the [place] one drink is one 12 oz bottle of beer (355 mL), one 5 oz glass of  wine (148 mL), or one 1 oz glass of hard liquor (44 mL). General instructions Take medicines only as told. Keep all follow-up visits. This includes having regular colonoscopies. Talk to your provider about when you need a colonoscopy. Contact a health care provider if: You have new or increased bleeding when you poop. You have new or more blood in your poop. You have a change in bowel habits like constipation or diarrhea. You lose weight for no known reason. You throw up or feel like you may throw up. Get help right away if: You have very bad pain in your belly. This information is not intended to replace advice given to you by your health care provider. Make sure you discuss any questions you have with your health care provider. Document Revised: 07/07/2023 Document Reviewed: 07/07/2023 Elsevier Patient Education  2025 [employer].  Hemorrhoids Hemorrhoids are swollen veins that may form: In the butt (rectum). These are called  internal hemorrhoids. Around the opening of the butt (anus). These are called external hemorrhoids. Most hemorrhoids do not cause very bad problems. They often get better with changes to your lifestyle and what you eat. What are the causes? Having trouble pooping (constipation) or watery poop (diarrhea). Pushing too hard when you poop. Pregnancy. Being very overweight (obese). Sitting for too long. Riding a bike for a long time. Heavy lifting or other things that take a lot of effort. Anal sex. What are the signs or symptoms? Pain. Itching or soreness in the butt. Bleeding from the butt. Leaking poop. Swelling. One or more lumps around the opening of your butt. How is this treated? In most cases, hemorrhoids can be treated at home. You may be told to: Change what you eat. Make changes to your lifestyle. If these treatments do not help, you may need to have a procedure done. Your doctor may need to: Place rubber bands at the bottom of the hemorrhoids to make them fall off. Put medicine into the hemorrhoids to shrink them. Shine a type of light on the hemorrhoids to cause them to fall off. Do surgery to get rid of the hemorrhoids. Follow these instructions at home: Medicines Take over-the-counter and prescription medicines only as told by your doctor. Use creams with medicine in them or medicines that you put in your butt as told by your doctor. Eating and drinking  Eat foods that have a lot of fiber in them. These include whole grains, beans, nuts, fruits, and vegetables. Ask your doctor about taking products that have fiber added to them (fibersupplements). Take in less fat. You can do this by: Eating low-fat dairy products. Eating less red meat. Staying away from processed foods. Drink enough fluid to keep your pee (urine) pale yellow. Managing pain and swelling  Take a warm-water bath (sitz bath) for 20 minutes to ease pain. Do this 3-4 times a day. You may do this in a  bathtub. You may also use a portable sitz bath that fits over the toilet. If told, put ice on the painful area. It may help to use ice between your warm baths. Put ice in a plastic bag. Place a towel between your skin and the bag. Leave the ice on for 20 minutes, 2-3 times a day. If your skin turns bright red, take off the ice right away to prevent skin damage. The risk of damage is higher if you cannot feel pain, heat, or cold. General instructions Exercise. Ask your doctor how much and what kind of exercise  is best for you. Go to the bathroom when you need to poop. Do not wait. Try not to push too hard when you poop. Keep your butt dry and clean. Use wet toilet paper or moist towelettes after you poop. Do not sit on the toilet for a long time. Contact a doctor if: You have pain and swelling that do not get better with treatment. You have trouble pooping. You cannot poop. You have pain or swelling outside the area of the hemorrhoids. Get help right away if: You have bleeding from the butt that will not stop. This information is not intended to replace advice given to you by your health care provider. Make sure you discuss any questions you have with your health care provider. Document Revised: 05/20/2022 Document Reviewed: 05/20/2022 Elsevier Patient Education  2024 [employer]  Diverticulosis  Diverticulosis is when small pouches called diverticula form in the wall of the colon. The colon is where water is absorbed. It is also where poop (stool) is formed. The pouches form when the inside layer of the colon pushes through weak spots in the outer layers of the colon. You may have a few pouches or many of them. In most cases, the pouches do not cause problems. If they become inflamed or infected, you may have a condition called diverticulitis. What are the causes? The cause of this condition is not known. What increases the risk? You are more likely to get this condition if: You are  older than 73 years of age. You do not eat enough fiber or you get constipated a lot. You are overweight. You do not get enough exercise. You smoke. You take over-the-counter pain medicines. You have a family history of the condition. What are the signs or symptoms? In most people, there are no symptoms. If you do have symptoms, they may include: Bloating. Stomach cramps. Constipation or diarrhea. Pain in the lower left side of your abdomen. How is this diagnosed? This condition is often diagnosed during an exam for other colon problems. It may be diagnosed when you have: A colonoscopy. This is when a tube with a camera on the end is used to look at your colon. A barium enema. This is an X-ray exam that uses dye to look at your colon. A CT scan. How is this treated? You may not need treatment. Your health care provider will tell you what you can do at home to help prevent problems. You may need treatment if you have symptoms or if you have had diverticulitis before. You may be told to: Eat a high-fiber diet. Take medicine to relax your colon. Lose weight. Follow these instructions at home: Medicines Take over-the-counter and prescription medicines only as told by your provider. If told, take a fiber supplement or probiotic. Managing constipation Your condition may cause constipation. To prevent or treat constipation, you may need to: Drink enough fluid to keep your pee (urine) pale yellow. Take over-the-counter or prescription medicines. Eat foods that are high in fiber, such as beans, whole grains, and fresh fruits and vegetables. Limit foods that are high in fat and processed sugars, such as fried or sweet foods. Try not to strain when you poop. Contact a health care provider if: Your symptoms get worse all of a sudden. You have pain in your abdomen that gets worse. You have bloating or stomach cramps. You continue to have frequent constipation. You have a fever or  chills. You vomit. Your poop is bloody, black, or tarry.  This information is not intended to replace advice given to you by your health care provider. Make sure you discuss any questions you have with your health care provider. Document Revised: 06/04/2022 Document Reviewed: 06/04/2022 Elsevier Patient Education  2024 [employer].

## 2024-08-02 NOTE — Progress Notes (Signed)
 Called to room to assist during endoscopic procedure.  Patient ID and intended procedure confirmed with present staff. Received instructions for my participation in the procedure from the performing physician.

## 2024-08-03 ENCOUNTER — Telehealth: Payer: Self-pay | Admitting: Lactation Services

## 2024-08-03 NOTE — Telephone Encounter (Signed)
 No answer left voice mail

## 2024-08-07 ENCOUNTER — Ambulatory Visit: Payer: Self-pay | Admitting: Internal Medicine

## 2024-08-07 DIAGNOSIS — Z860101 Personal history of adenomatous and serrated colon polyps: Secondary | ICD-10-CM

## 2024-08-08 LAB — SURGICAL PATHOLOGY

## 2024-08-11 DIAGNOSIS — R296 Repeated falls: Secondary | ICD-10-CM | POA: Diagnosis not present

## 2024-08-11 DIAGNOSIS — M4804 Spinal stenosis, thoracic region: Secondary | ICD-10-CM | POA: Diagnosis not present

## 2024-08-11 DIAGNOSIS — G629 Polyneuropathy, unspecified: Secondary | ICD-10-CM | POA: Diagnosis not present

## 2024-08-14 ENCOUNTER — Other Ambulatory Visit: Payer: Self-pay | Admitting: Internal Medicine

## 2024-08-14 DIAGNOSIS — Z860101 Personal history of adenomatous and serrated colon polyps: Secondary | ICD-10-CM

## 2024-08-18 ENCOUNTER — Other Ambulatory Visit (HOSPITAL_COMMUNITY): Payer: Self-pay

## 2024-08-21 ENCOUNTER — Other Ambulatory Visit (HOSPITAL_COMMUNITY): Payer: Self-pay

## 2024-08-26 ENCOUNTER — Other Ambulatory Visit (HOSPITAL_COMMUNITY): Payer: Self-pay

## 2024-08-28 ENCOUNTER — Other Ambulatory Visit (HOSPITAL_COMMUNITY): Payer: Self-pay

## 2024-08-28 MED ORDER — ARIPIPRAZOLE 2 MG PO TABS
2.0000 mg | ORAL_TABLET | Freq: Every day | ORAL | 0 refills | Status: AC
  Filled 2024-08-28: qty 90, 90d supply, fill #0

## 2024-08-29 ENCOUNTER — Other Ambulatory Visit (HOSPITAL_COMMUNITY): Payer: Self-pay

## 2024-09-20 ENCOUNTER — Encounter: Payer: Self-pay | Admitting: *Deleted

## 2024-09-20 ENCOUNTER — Other Ambulatory Visit (HOSPITAL_COMMUNITY): Payer: Self-pay

## 2024-09-20 DIAGNOSIS — S32511A Fracture of superior rim of right pubis, initial encounter for closed fracture: Secondary | ICD-10-CM

## 2024-09-20 DIAGNOSIS — M545 Low back pain, unspecified: Secondary | ICD-10-CM

## 2024-09-20 DIAGNOSIS — M25551 Pain in right hip: Secondary | ICD-10-CM

## 2024-09-20 DIAGNOSIS — W19XXXA Unspecified fall, initial encounter: Secondary | ICD-10-CM

## 2024-09-20 DIAGNOSIS — M25552 Pain in left hip: Secondary | ICD-10-CM

## 2024-09-22 ENCOUNTER — Other Ambulatory Visit (HOSPITAL_BASED_OUTPATIENT_CLINIC_OR_DEPARTMENT_OTHER): Payer: Self-pay | Admitting: Cardiovascular Disease

## 2024-09-25 ENCOUNTER — Other Ambulatory Visit (HOSPITAL_COMMUNITY): Payer: Self-pay

## 2024-09-25 MED ORDER — METOPROLOL SUCCINATE ER 50 MG PO TB24
50.0000 mg | ORAL_TABLET | Freq: Every day | ORAL | 0 refills | Status: AC
  Filled 2024-09-25: qty 90, 90d supply, fill #0

## 2024-10-01 ENCOUNTER — Ambulatory Visit (HOSPITAL_COMMUNITY): Admission: RE | Admit: 2024-10-01 | Discharge: 2024-10-01

## 2024-10-01 DIAGNOSIS — M4804 Spinal stenosis, thoracic region: Secondary | ICD-10-CM | POA: Insufficient documentation

## 2024-10-01 DIAGNOSIS — I700 Atherosclerosis of aorta: Secondary | ICD-10-CM | POA: Insufficient documentation

## 2024-10-01 DIAGNOSIS — M25551 Pain in right hip: Secondary | ICD-10-CM | POA: Insufficient documentation

## 2024-10-01 DIAGNOSIS — M5136 Other intervertebral disc degeneration, lumbar region with discogenic back pain only: Secondary | ICD-10-CM | POA: Insufficient documentation

## 2024-10-01 DIAGNOSIS — S32511A Fracture of superior rim of right pubis, initial encounter for closed fracture: Secondary | ICD-10-CM | POA: Insufficient documentation

## 2024-10-01 DIAGNOSIS — W19XXXA Unspecified fall, initial encounter: Secondary | ICD-10-CM | POA: Diagnosis not present

## 2024-10-01 DIAGNOSIS — M545 Low back pain, unspecified: Secondary | ICD-10-CM

## 2024-10-01 DIAGNOSIS — M25552 Pain in left hip: Secondary | ICD-10-CM | POA: Insufficient documentation

## 2024-10-16 ENCOUNTER — Other Ambulatory Visit (HOSPITAL_COMMUNITY): Payer: Self-pay

## 2024-10-16 MED ORDER — GLUCOSE BLOOD VI STRP
ORAL_STRIP | 3 refills | Status: AC
  Filled 2024-10-16: qty 200, 100d supply, fill #0

## 2024-10-17 ENCOUNTER — Other Ambulatory Visit (HOSPITAL_COMMUNITY): Payer: Self-pay

## 2024-10-19 ENCOUNTER — Other Ambulatory Visit (HOSPITAL_COMMUNITY): Payer: Self-pay

## 2024-10-19 MED ORDER — TRAZODONE HCL 50 MG PO TABS
50.0000 mg | ORAL_TABLET | Freq: Every evening | ORAL | 0 refills | Status: AC | PRN
  Filled 2024-10-19: qty 90, 90d supply, fill #0

## 2024-10-24 ENCOUNTER — Other Ambulatory Visit (HOSPITAL_COMMUNITY): Payer: Self-pay

## 2024-10-25 ENCOUNTER — Other Ambulatory Visit (HOSPITAL_COMMUNITY): Payer: Self-pay

## 2024-10-25 MED ORDER — JANUVIA 100 MG PO TABS
100.0000 mg | ORAL_TABLET | Freq: Every day | ORAL | 4 refills | Status: AC
  Filled 2024-10-25: qty 90, 90d supply, fill #0

## 2024-10-27 ENCOUNTER — Emergency Department (HOSPITAL_COMMUNITY)
Admission: EM | Admit: 2024-10-27 | Source: Home / Self Care | Attending: Emergency Medicine | Admitting: Emergency Medicine

## 2024-10-27 ENCOUNTER — Encounter (HOSPITAL_COMMUNITY): Payer: Self-pay | Admitting: *Deleted

## 2024-10-27 ENCOUNTER — Other Ambulatory Visit: Payer: Self-pay

## 2024-10-27 LAB — CBC WITH DIFFERENTIAL/PLATELET
Abs Immature Granulocytes: 0.05 10*3/uL (ref 0.00–0.07)
Basophils Absolute: 0 10*3/uL (ref 0.0–0.1)
Basophils Relative: 0 %
Eosinophils Absolute: 0 10*3/uL (ref 0.0–0.5)
Eosinophils Relative: 0 %
HCT: 44.1 % (ref 39.0–52.0)
Hemoglobin: 14.7 g/dL (ref 13.0–17.0)
Immature Granulocytes: 0 %
Lymphocytes Relative: 8 %
Lymphs Abs: 1 10*3/uL (ref 0.7–4.0)
MCH: 27.9 pg (ref 26.0–34.0)
MCHC: 33.3 g/dL (ref 30.0–36.0)
MCV: 83.7 fL (ref 80.0–100.0)
Monocytes Absolute: 0.7 10*3/uL (ref 0.1–1.0)
Monocytes Relative: 6 %
Neutro Abs: 10.9 10*3/uL — ABNORMAL HIGH (ref 1.7–7.7)
Neutrophils Relative %: 86 %
Platelets: 247 10*3/uL (ref 150–400)
RBC: 5.27 MIL/uL (ref 4.22–5.81)
RDW: 15.1 % (ref 11.5–15.5)
WBC: 12.8 10*3/uL — ABNORMAL HIGH (ref 4.0–10.5)
nRBC: 0 % (ref 0.0–0.2)

## 2024-10-27 LAB — COMPREHENSIVE METABOLIC PANEL WITH GFR
ALT: 14 U/L (ref 0–44)
AST: 37 U/L (ref 15–41)
Albumin: 4.2 g/dL (ref 3.5–5.0)
Alkaline Phosphatase: 80 U/L (ref 38–126)
Anion gap: 22 — ABNORMAL HIGH (ref 5–15)
BUN: 20 mg/dL (ref 8–23)
CO2: 15 mmol/L — ABNORMAL LOW (ref 22–32)
Calcium: 10.5 mg/dL — ABNORMAL HIGH (ref 8.9–10.3)
Chloride: 101 mmol/L (ref 98–111)
Creatinine, Ser: 1.17 mg/dL (ref 0.61–1.24)
GFR, Estimated: >60 mL/min
Glucose, Bld: 149 mg/dL — ABNORMAL HIGH (ref 70–99)
Potassium: 4.3 mmol/L (ref 3.5–5.1)
Sodium: 138 mmol/L (ref 135–145)
Total Bilirubin: 0.8 mg/dL (ref 0.0–1.2)
Total Protein: 7.6 g/dL (ref 6.5–8.1)

## 2024-10-27 LAB — URINE DRUG SCREEN
Amphetamines: NEGATIVE
Barbiturates: NEGATIVE
Benzodiazepines: NEGATIVE
Cocaine: NEGATIVE
Fentanyl: NEGATIVE
Methadone Scn, Ur: NEGATIVE
Opiates: NEGATIVE
Tetrahydrocannabinol: POSITIVE — AB

## 2024-10-27 LAB — URINALYSIS, W/ REFLEX TO CULTURE (INFECTION SUSPECTED)
Bilirubin Urine: NEGATIVE
Glucose, UA: NEGATIVE mg/dL
Ketones, ur: 80 mg/dL — AB
Nitrite: POSITIVE — AB
Protein, ur: 100 mg/dL — AB
Specific Gravity, Urine: 1.018 (ref 1.005–1.030)
WBC, UA: >50 WBC/hpf (ref 0–5)
pH: 7 (ref 5.0–8.0)

## 2024-10-27 LAB — ETHANOL: Alcohol, Ethyl (B): <15 mg/dL

## 2024-10-27 MED ORDER — ARIPIPRAZOLE 2 MG PO TABS
2.0000 mg | ORAL_TABLET | Freq: Every day | ORAL | Status: AC
  Administered 2024-10-27: 2 mg via ORAL
  Filled 2024-10-27: qty 1

## 2024-10-27 MED ORDER — SODIUM CHLORIDE 0.9 % IV BOLUS: 2000.0000 mL | Freq: Once | INTRAVENOUS | Status: AC

## 2024-10-27 MED ORDER — METFORMIN HCL ER 500 MG PO TB24
500.0000 mg | ORAL_TABLET | Freq: Two times a day (BID) | ORAL | Status: AC
  Filled 2024-10-27: qty 1

## 2024-10-27 MED ORDER — METOPROLOL SUCCINATE ER 50 MG PO TB24
50.0000 mg | ORAL_TABLET | Freq: Every day | ORAL | Status: AC
  Filled 2024-10-27: qty 1

## 2024-10-27 MED ORDER — INSULIN ASPART 100 UNIT/ML IJ SOLN: 2.0000 [IU] | Freq: Three times a day (TID) | INTRAMUSCULAR | Status: AC

## 2024-10-27 MED ORDER — ROSUVASTATIN CALCIUM 20 MG PO TABS
10.0000 mg | ORAL_TABLET | Freq: Every day | ORAL | Status: AC
  Filled 2024-10-27: qty 1

## 2024-10-27 MED ORDER — INSULIN GLARGINE-YFGN 100 UNIT/ML ~~LOC~~ SOLN: 12.0000 [IU] | Freq: Every day | SUBCUTANEOUS | Status: AC

## 2024-10-27 MED ORDER — PANTOPRAZOLE SODIUM 40 MG PO TBEC
40.0000 mg | DELAYED_RELEASE_TABLET | Freq: Every day | ORAL | Status: AC
  Filled 2024-10-27: qty 1

## 2024-10-27 MED ORDER — ASPIRIN 81 MG PO TBEC
81.0000 mg | DELAYED_RELEASE_TABLET | Freq: Every day | ORAL | Status: AC
  Administered 2024-10-27: 81 mg via ORAL
  Filled 2024-10-27: qty 1

## 2024-10-27 MED ORDER — INSULIN DEGLUDEC 100 UNIT/ML ~~LOC~~ SOPN: 12.0000 [IU] | PEN_INJECTOR | Freq: Every evening | SUBCUTANEOUS | Status: DC

## 2024-10-27 MED ORDER — SERTRALINE HCL 50 MG PO TABS
200.0000 mg | ORAL_TABLET | Freq: Every day | ORAL | Status: AC
  Filled 2024-10-27: qty 4

## 2024-10-27 MED ORDER — ACETAMINOPHEN 500 MG PO TABS: 500.0000 mg | ORAL_TABLET | Freq: Four times a day (QID) | ORAL | Status: AC | PRN

## 2024-10-27 MED ORDER — LINAGLIPTIN 5 MG PO TABS: 5.0000 mg | ORAL_TABLET | Freq: Every day | ORAL | Status: AC

## 2024-10-27 MED ORDER — GLIMEPIRIDE 4 MG PO TABS: 4.0000 mg | ORAL_TABLET | Freq: Every day | ORAL | Status: DC

## 2024-10-27 MED ORDER — ACETAMINOPHEN 325 MG PO TABS
650.0000 mg | ORAL_TABLET | Freq: Once | ORAL | Status: AC
  Administered 2024-10-27: 650 mg via ORAL
  Filled 2024-10-27: qty 2

## 2024-10-27 NOTE — ED Notes (Signed)
 Discussed with pt the need for urine sample, pt stated he will try a little later but can't right now

## 2024-10-27 NOTE — ED Notes (Signed)
 2nd set of tubes sent to lab
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
## 2024-10-27 NOTE — ED Notes (Signed)
 Pt called out for a nurse, once I came inside the room he stated he was spitting on the floor  Pt declined a vomit bag and stated he would continue spitting on the floor. Pt again requested I leave, Pt refused vital signs at this time
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
## 2024-10-27 NOTE — ED Notes (Signed)
 Pt calm, cooperative. C/o chronic back pain

## 2024-10-27 NOTE — ED Notes (Signed)
 Urine not collected and still needed

## 2024-10-27 NOTE — ED Notes (Signed)
 Attempted IV and labs. IV unsuccessful.

## 2024-10-27 NOTE — ED Notes (Signed)
 Pt called for a nurse, stated he needed to use the bathroom however he requested I leave because he wanted a male and became upset that I was in the room

## 2024-10-27 NOTE — ED Notes (Signed)
 EDP at [facility]

## 2024-11-03 ENCOUNTER — Ambulatory Visit (HOSPITAL_COMMUNITY)

## 2024-11-03 ENCOUNTER — Ambulatory Visit (HOSPITAL_COMMUNITY): Admitting: Vascular Surgery
